# Patient Record
Sex: Male | Born: 1935 | Race: White | Hispanic: No | Smoking: Current every day smoker
Health system: Southern US, Community
[De-identification: ages and names within clinical notes are randomized; demographics above are authoritative.]

## PROBLEM LIST (undated history)

## (undated) DIAGNOSIS — J449 Chronic obstructive pulmonary disease, unspecified: Secondary | ICD-10-CM

## (undated) DIAGNOSIS — I517 Cardiomegaly: Secondary | ICD-10-CM

## (undated) DIAGNOSIS — R351 Nocturia: Secondary | ICD-10-CM

## (undated) DIAGNOSIS — Z8739 Personal history of other diseases of the musculoskeletal system and connective tissue: Secondary | ICD-10-CM

## (undated) DIAGNOSIS — I10 Essential (primary) hypertension: Secondary | ICD-10-CM

## (undated) DIAGNOSIS — E785 Hyperlipidemia, unspecified: Secondary | ICD-10-CM

## (undated) DIAGNOSIS — T7840XA Allergy, unspecified, initial encounter: Secondary | ICD-10-CM

## (undated) DIAGNOSIS — M1990 Unspecified osteoarthritis, unspecified site: Secondary | ICD-10-CM

## (undated) DIAGNOSIS — R55 Syncope and collapse: Secondary | ICD-10-CM

## (undated) DIAGNOSIS — J189 Pneumonia, unspecified organism: Secondary | ICD-10-CM

## (undated) DIAGNOSIS — I2510 Atherosclerotic heart disease of native coronary artery without angina pectoris: Secondary | ICD-10-CM

## (undated) DIAGNOSIS — Z720 Tobacco use: Secondary | ICD-10-CM

## (undated) DIAGNOSIS — Z9981 Dependence on supplemental oxygen: Secondary | ICD-10-CM

## (undated) DIAGNOSIS — I219 Acute myocardial infarction, unspecified: Secondary | ICD-10-CM

## (undated) DIAGNOSIS — Z7901 Long term (current) use of anticoagulants: Secondary | ICD-10-CM

## (undated) DIAGNOSIS — I509 Heart failure, unspecified: Secondary | ICD-10-CM

## (undated) DIAGNOSIS — M549 Dorsalgia, unspecified: Secondary | ICD-10-CM

## (undated) DIAGNOSIS — G8929 Other chronic pain: Secondary | ICD-10-CM

## (undated) DIAGNOSIS — I4891 Unspecified atrial fibrillation: Secondary | ICD-10-CM

## (undated) DIAGNOSIS — I513 Intracardiac thrombosis, not elsewhere classified: Secondary | ICD-10-CM

## (undated) HISTORY — DX: Allergy, unspecified, initial encounter: T78.40XA

## (undated) HISTORY — DX: Cardiomegaly: I51.7

## (undated) HISTORY — DX: Heart failure, unspecified: I50.9

## (undated) HISTORY — DX: Hyperlipidemia, unspecified: E78.5

## (undated) HISTORY — DX: Long term (current) use of anticoagulants: Z79.01

## (undated) HISTORY — DX: Essential (primary) hypertension: I10

## (undated) HISTORY — DX: Chronic obstructive pulmonary disease, unspecified: J44.9

## (undated) HISTORY — DX: Nocturia: R35.1

## (undated) HISTORY — DX: Tobacco use: Z72.0

## (undated) HISTORY — PX: CHOLECYSTECTOMY: SHX55

## (undated) HISTORY — PX: HERNIA REPAIR: SHX51

## (undated) HISTORY — DX: Atherosclerotic heart disease of native coronary artery without angina pectoris: I25.10

## (undated) HISTORY — DX: Syncope and collapse: R55

---

## 1979-11-19 DIAGNOSIS — I219 Acute myocardial infarction, unspecified: Secondary | ICD-10-CM

## 1979-11-19 HISTORY — PX: CARDIAC CATHETERIZATION: SHX172

## 1979-11-19 HISTORY — DX: Acute myocardial infarction, unspecified: I21.9

## 2005-05-08 ENCOUNTER — Encounter: Admission: RE | Admit: 2005-05-08 | Discharge: 2005-05-08 | Payer: Self-pay | Admitting: Cardiology

## 2008-03-09 ENCOUNTER — Emergency Department (HOSPITAL_COMMUNITY): Admission: EM | Admit: 2008-03-09 | Discharge: 2008-03-09 | Payer: Self-pay | Admitting: Emergency Medicine

## 2008-05-17 ENCOUNTER — Emergency Department (HOSPITAL_COMMUNITY): Admission: EM | Admit: 2008-05-17 | Discharge: 2008-05-17 | Payer: Self-pay | Admitting: Family Medicine

## 2010-07-02 ENCOUNTER — Ambulatory Visit: Payer: Self-pay | Admitting: Cardiology

## 2010-07-11 ENCOUNTER — Encounter
Admission: RE | Admit: 2010-07-11 | Discharge: 2010-07-11 | Payer: Self-pay | Source: Home / Self Care | Admitting: Internal Medicine

## 2010-07-27 ENCOUNTER — Ambulatory Visit: Payer: Self-pay | Admitting: Cardiology

## 2010-08-28 ENCOUNTER — Ambulatory Visit: Payer: Self-pay | Admitting: Cardiology

## 2010-09-26 ENCOUNTER — Ambulatory Visit: Payer: Self-pay | Admitting: Cardiology

## 2010-10-23 ENCOUNTER — Ambulatory Visit: Payer: Self-pay | Admitting: Cardiovascular Disease

## 2010-11-27 ENCOUNTER — Ambulatory Visit: Payer: Self-pay | Admitting: Cardiology

## 2010-12-26 ENCOUNTER — Other Ambulatory Visit (INDEPENDENT_AMBULATORY_CARE_PROVIDER_SITE_OTHER): Payer: Medicare Other

## 2010-12-26 DIAGNOSIS — I2589 Other forms of chronic ischemic heart disease: Secondary | ICD-10-CM

## 2010-12-26 DIAGNOSIS — Z7901 Long term (current) use of anticoagulants: Secondary | ICD-10-CM

## 2010-12-27 ENCOUNTER — Other Ambulatory Visit: Payer: Self-pay

## 2011-01-09 ENCOUNTER — Other Ambulatory Visit (INDEPENDENT_AMBULATORY_CARE_PROVIDER_SITE_OTHER): Payer: Medicare Other

## 2011-01-09 DIAGNOSIS — Z7901 Long term (current) use of anticoagulants: Secondary | ICD-10-CM

## 2011-01-09 DIAGNOSIS — I2510 Atherosclerotic heart disease of native coronary artery without angina pectoris: Secondary | ICD-10-CM

## 2011-01-23 ENCOUNTER — Other Ambulatory Visit (INDEPENDENT_AMBULATORY_CARE_PROVIDER_SITE_OTHER): Payer: Medicare Other

## 2011-01-23 DIAGNOSIS — R079 Chest pain, unspecified: Secondary | ICD-10-CM

## 2011-01-23 DIAGNOSIS — R0602 Shortness of breath: Secondary | ICD-10-CM

## 2011-01-23 DIAGNOSIS — I252 Old myocardial infarction: Secondary | ICD-10-CM

## 2011-01-23 DIAGNOSIS — Z7901 Long term (current) use of anticoagulants: Secondary | ICD-10-CM

## 2011-02-06 ENCOUNTER — Other Ambulatory Visit: Payer: Self-pay | Admitting: *Deleted

## 2011-02-06 ENCOUNTER — Telehealth: Payer: Self-pay | Admitting: *Deleted

## 2011-02-06 DIAGNOSIS — J449 Chronic obstructive pulmonary disease, unspecified: Secondary | ICD-10-CM

## 2011-02-06 MED ORDER — THEOPHYLLINE 300 MG PO TB12: 300.0000 mg | ORAL_TABLET | Freq: Every day | ORAL | Status: DC

## 2011-02-06 NOTE — Telephone Encounter (Signed)
Message copied by Regis Bill on Wed Feb 06, 2011  5:01 PM ------      Message from: Neil Crouch      Created: Wed Feb 06, 2011  4:39 PM      Regarding: FYI      Contact: [phone]       DR, Dionne Ano IS SENDING HIM TO [facility] FOR BREATHING TEST [facility]. JUST WANTED TO LET DR. BRACKBILL KNOW. "ALSO A NURSE WITH [employer] [facility] TOLD HIM HE MAY WANT TO HAVE TEST WHERE THEY PUT GEL OVER THE HEART AND RUN THING OVER TO SEE". JUST PASSING IT ALONG.

## 2011-02-06 NOTE — Telephone Encounter (Signed)
Refilled med per fax 

## 2011-02-07 ENCOUNTER — Ambulatory Visit (HOSPITAL_COMMUNITY)
Admission: RE | Admit: 2011-02-07 | Discharge: 2011-02-07 | Disposition: A | Discharge status: Home / Self Care | Payer: Medicare Other | Source: Ambulatory Visit | Attending: Internal Medicine | Admitting: Internal Medicine

## 2011-02-07 DIAGNOSIS — R0602 Shortness of breath: Secondary | ICD-10-CM | POA: Insufficient documentation

## 2011-02-26 ENCOUNTER — Other Ambulatory Visit: Payer: Self-pay | Admitting: *Deleted

## 2011-02-26 ENCOUNTER — Ambulatory Visit: Payer: Medicare Other | Admitting: Cardiology

## 2011-02-26 DIAGNOSIS — Z79899 Other long term (current) drug therapy: Secondary | ICD-10-CM

## 2011-02-26 DIAGNOSIS — E7800 Pure hypercholesterolemia, unspecified: Secondary | ICD-10-CM

## 2011-02-27 ENCOUNTER — Ambulatory Visit (INDEPENDENT_AMBULATORY_CARE_PROVIDER_SITE_OTHER): Payer: Medicare Other | Admitting: Cardiology

## 2011-02-27 ENCOUNTER — Encounter: Payer: Self-pay | Admitting: *Deleted

## 2011-02-27 ENCOUNTER — Encounter: Payer: Self-pay | Admitting: Cardiology

## 2011-02-27 ENCOUNTER — Other Ambulatory Visit (INDEPENDENT_AMBULATORY_CARE_PROVIDER_SITE_OTHER): Payer: Medicare Other | Admitting: *Deleted

## 2011-02-27 ENCOUNTER — Ambulatory Visit (INDEPENDENT_AMBULATORY_CARE_PROVIDER_SITE_OTHER): Payer: Medicare Other | Admitting: *Deleted

## 2011-02-27 DIAGNOSIS — F17200 Nicotine dependence, unspecified, uncomplicated: Secondary | ICD-10-CM

## 2011-02-27 DIAGNOSIS — I252 Old myocardial infarction: Secondary | ICD-10-CM | POA: Insufficient documentation

## 2011-02-27 DIAGNOSIS — R9389 Abnormal findings on diagnostic imaging of other specified body structures: Secondary | ICD-10-CM

## 2011-02-27 DIAGNOSIS — R079 Chest pain, unspecified: Secondary | ICD-10-CM | POA: Insufficient documentation

## 2011-02-27 DIAGNOSIS — J449 Chronic obstructive pulmonary disease, unspecified: Secondary | ICD-10-CM

## 2011-02-27 DIAGNOSIS — E7800 Pure hypercholesterolemia, unspecified: Secondary | ICD-10-CM

## 2011-02-27 DIAGNOSIS — Z79899 Other long term (current) drug therapy: Secondary | ICD-10-CM

## 2011-02-27 DIAGNOSIS — I4891 Unspecified atrial fibrillation: Secondary | ICD-10-CM

## 2011-02-27 DIAGNOSIS — Z720 Tobacco use: Secondary | ICD-10-CM | POA: Insufficient documentation

## 2011-02-27 DIAGNOSIS — M1990 Unspecified osteoarthritis, unspecified site: Secondary | ICD-10-CM | POA: Insufficient documentation

## 2011-02-27 DIAGNOSIS — Z7901 Long term (current) use of anticoagulants: Secondary | ICD-10-CM

## 2011-02-27 LAB — BASIC METABOLIC PANEL
BUN: 14 mg/dL (ref 6–23)
Chloride: 101 mEq/L (ref 96–112)
Glucose, Bld: 100 mg/dL — ABNORMAL HIGH (ref 70–99)
Potassium: 4 mEq/L (ref 3.5–5.1)
Sodium: 140 mEq/L (ref 135–145)

## 2011-02-27 LAB — CBC WITH DIFFERENTIAL/PLATELET
Basophils Absolute: 0.1 10*3/uL (ref 0.0–0.1)
Eosinophils Absolute: 0.3 10*3/uL (ref 0.0–0.7)
Eosinophils Relative: 3.9 % (ref 0.0–5.0)
Lymphs Abs: 1.3 10*3/uL (ref 0.7–4.0)
Monocytes Absolute: 0.8 10*3/uL (ref 0.1–1.0)
Monocytes Relative: 9.8 % (ref 3.0–12.0)
Neutro Abs: 5.5 10*3/uL (ref 1.4–7.7)
Platelets: 260 10*3/uL (ref 150.0–400.0)
RDW: 15.7 % — ABNORMAL HIGH (ref 11.5–14.6)

## 2011-02-27 LAB — HEPATIC FUNCTION PANEL
AST: 17 U/L (ref 0–37)
Albumin: 2.9 g/dL — ABNORMAL LOW (ref 3.5–5.2)
Alkaline Phosphatase: 70 U/L (ref 39–117)
Bilirubin, Direct: 0.2 mg/dL (ref 0.0–0.3)
Total Protein: 5.9 g/dL — ABNORMAL LOW (ref 6.0–8.3)

## 2011-02-27 LAB — LIPID PANEL: VLDL: 21.4 mg/dL (ref 0.0–40.0)

## 2011-02-27 NOTE — Assessment & Plan Note (Signed)
The patient's cough has improved significantly.  He is not bringing up any purulent sputum.  He denies any chills or fever or night sweats.

## 2011-02-27 NOTE — Progress Notes (Signed)
History of Present Illness: This pleasant 77 year old gentleman is seen for a scheduled followup office visit he has a history of known ischemic heart disease.  He gives a history of a remote myocardial infarction in 1981.  He had cardiac catheterization at that time.  He did not require coronary bypass surgery.  He has been on long-term Coumadin.  He has a left ventricular apical aneurysm.  His last echocardiogram was on 09/08/06 and his last nuclear stress test was 07/1707 and showed severe left ventricle systolic dysfunction with an ejection fraction of 39% and with an old apical myocardial infarction with apical aneurysm.  Current Outpatient Prescriptions  Medication Sig Dispense Refill  . ALPRAZolam (XANAX) 0.25 MG tablet Take 0.25 mg by mouth at bedtime as needed.        Marland Kitchen aspirin 81 MG tablet Take 81 mg by mouth daily.        . calcium carbonate 200 MG capsule Take 500 mg by mouth daily.        . fish oil-omega-3 fatty acids 1000 MG capsule Take 2 g by mouth daily.        . furosemide (LASIX) 40 MG tablet Take 40 mg by mouth daily. Take 1 and 1/2 pill daily (60mg )       . guaiFENesin (MUCINEX) 600 MG 12 hr tablet Take 1,200 mg by mouth 2 (two) times daily as needed.        Marland Kitchen HYDROcodone-acetaminophen (VICODIN) 5-500 MG per tablet Take 1 tablet by mouth every 6 (six) hours as needed.        . isosorbide mononitrate (IMDUR) 60 MG 24 hr tablet Take 60 mg by mouth daily.        Marland Kitchen levalbuterol (XOPENEX HFA) 45 MCG/ACT inhaler Inhale 1-2 puffs into the lungs every 4 (four) hours as needed.        . meclizine (ANTIVERT) 32 MG tablet Take 32 mg by mouth 3 (three) times daily as needed.        . metoprolol (TOPROL-XL) 50 MG 24 hr tablet Take 50 mg by mouth 2 (two) times daily.        . nitroGLYCERIN (NITROSTAT) 0.4 MG SL tablet Place 0.4 mg under the tongue every 5 (five) minutes as needed.        . potassium chloride SA (K-DUR,KLOR-CON) 20 MEQ tablet Take 20 mEq by mouth daily.        . rosuvastatin  (CRESTOR) 10 MG tablet Take 10 mg by mouth daily. Taking one half daily      . sodium bicarbonate 650 MG tablet Take 650 mg by mouth daily.        . temazepam (RESTORIL) 30 MG capsule Take 30 mg by mouth at bedtime as needed.        . theophylline (THEODUR) 300 MG 12 hr tablet Take 1 tablet (300 mg total) by mouth daily.  30 tablet  11  . valsartan-hydrochlorothiazide (DIOVAN-HCT) 160-25 MG per tablet Take 1 tablet by mouth daily.        Marland Kitchen warfarin (COUMADIN) 5 MG tablet Take 5 mg by mouth daily. Take as directed          Allergies  Allergen Reactions  . Indocin   . Lipitor (Atorvastatin Calcium)   . Pravachol   . Procardia (Nifedipine)   . Zaroxolyn (Metolazone)     Patient Active Problem List  Diagnoses  . Atrial fibrillation  . Apical myocardial infarction greater than eight weeks ago  . COPD (chronic  obstructive pulmonary disease)  . Tobacco abuse  . Hypercholesterolemia  . Osteoarthritis  . Chest pain    History  Smoking status  . Current Some Day Smoker -- 0.5 packs/day  . Types: Cigarettes  Smokeless tobacco  . Not on file    History  Alcohol Use No    No family history on file.  Review of Systems: Constitutional: no fever chills diaphoresis or fatigue or change in weight.  Head and neck: no hearing loss, no epistaxis, no photophobia or visual disturbance. Respiratory: No cough, shortness of breath or wheezing. Cardiovascular: No chest pain peripheral edema, palpitations. Gastrointestinal: No abdominal distention, no abdominal pain, no change in bowel habits hematochezia or melena. Genitourinary: No dysuria, no frequency, no urgency, no nocturia. Musculoskeletal:No arthralgias, no back pain, no gait disturbance or myalgias. Neurological: No dizziness, no headaches, no numbness, no seizures, no syncope, no weakness, no tremors. Hematologic: No lymphadenopathy, no easy bruising. Psychiatric: No confusion, no hallucinations, no sleep  disturbance.    Physical Exam: Filed Vitals:   02/27/11 1058  BP: 110/60  Pulse: 64  Weight is not recorded.  The general appearance reveals an elderly gentleman in no acute distress.Pupils equal and reactive.   Extraocular Movements are full.  There is no scleral icterus.  The mouth and pharynx are normal.  The neck is supple.  The carotids reveal no bruits.  The jugular venous pressure is normal.  The thyroid is not enlarged.  There is no lymphadenopathy.  His eyes are slightly proptotic which is normal for him.  Chest reveals scattered expiratory wheezes and rhonchi but improved from previous exam.The precordium is quiet.  The first heart sound is normal.  The second heart sound is physiologically split.  There is no murmur gallop rub or click.  There is no abnormal lift or heave.The abdomen is soft and nontender. Bowel sounds are normal. The liver and spleen are not enlarged. There Are no abdominal masses. There are no bruits. Normal extremity without phlebitis And he does have 1+ peripheral edema of the ankles.Strength is normal and symmetrical in all extremities.  There is no lateralizing weakness.  There are no sensory deficits.   Assessment / Plan: Continue present medication.Recheck in one month and in 2 months for protime in 3 months for office visit protime and EKG and after that consider echocardiogram.

## 2011-02-27 NOTE — Assessment & Plan Note (Signed)
The patient is on long-term Coumadin.  He has not been having any thromboembolic symptoms.  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  He has not had any recent documented episodes of atrial fibrillation.

## 2011-02-27 NOTE — Assessment & Plan Note (Signed)
Since last visit the patient has improved clinically.  He had been taking 4-5 nitroglycerin a day.  Now he has not had any nitroglycerin for the past several weeks.  Since last visit he has been taking indoor 60 mg daily.  It seems to have helped.  Also getting over his acute bronchitis exacerbation has helped his heart.

## 2011-02-27 NOTE — Assessment & Plan Note (Signed)
The patient generally would like to quit smoking.  He has ordered a smoke lives in [place] cigarette from a company and he is expecting to have arrive in the mail soon.

## 2011-02-28 ENCOUNTER — Telehealth: Payer: Self-pay | Admitting: *Deleted

## 2011-02-28 NOTE — Telephone Encounter (Signed)
Adv. Patient of labs 

## 2011-03-27 ENCOUNTER — Ambulatory Visit (INDEPENDENT_AMBULATORY_CARE_PROVIDER_SITE_OTHER): Payer: Medicare Other | Admitting: *Deleted

## 2011-03-27 DIAGNOSIS — Z7901 Long term (current) use of anticoagulants: Secondary | ICD-10-CM

## 2011-03-27 DIAGNOSIS — R9389 Abnormal findings on diagnostic imaging of other specified body structures: Secondary | ICD-10-CM

## 2011-03-29 ENCOUNTER — Other Ambulatory Visit: Payer: Self-pay | Admitting: *Deleted

## 2011-03-29 DIAGNOSIS — J449 Chronic obstructive pulmonary disease, unspecified: Secondary | ICD-10-CM

## 2011-03-29 MED ORDER — ALBUTEROL SULFATE HFA 108 (90 BASE) MCG/ACT IN AERS: 2.0000 | INHALATION_SPRAY | Freq: Four times a day (QID) | RESPIRATORY_TRACT | Status: DC | PRN

## 2011-03-29 NOTE — Telephone Encounter (Signed)
Refilled proair inhaler, [facility]

## 2011-04-02 ENCOUNTER — Other Ambulatory Visit: Payer: Self-pay | Admitting: *Deleted

## 2011-04-02 DIAGNOSIS — G4700 Insomnia, unspecified: Secondary | ICD-10-CM

## 2011-04-02 MED ORDER — TEMAZEPAM 30 MG PO CAPS: 30.0000 mg | ORAL_CAPSULE | Freq: Every evening | ORAL | Status: DC | PRN

## 2011-04-02 NOTE — Telephone Encounter (Signed)
Faxed authorization back.

## 2011-04-10 ENCOUNTER — Ambulatory Visit (INDEPENDENT_AMBULATORY_CARE_PROVIDER_SITE_OTHER): Payer: Medicare Other | Admitting: *Deleted

## 2011-04-10 DIAGNOSIS — R9389 Abnormal findings on diagnostic imaging of other specified body structures: Secondary | ICD-10-CM

## 2011-04-10 DIAGNOSIS — Z7901 Long term (current) use of anticoagulants: Secondary | ICD-10-CM

## 2011-04-25 ENCOUNTER — Encounter (INDEPENDENT_AMBULATORY_CARE_PROVIDER_SITE_OTHER): Payer: Medicare Other | Admitting: *Deleted

## 2011-04-25 ENCOUNTER — Ambulatory Visit (INDEPENDENT_AMBULATORY_CARE_PROVIDER_SITE_OTHER): Payer: Medicare Other | Admitting: *Deleted

## 2011-04-25 DIAGNOSIS — I4891 Unspecified atrial fibrillation: Secondary | ICD-10-CM

## 2011-04-25 DIAGNOSIS — R9389 Abnormal findings on diagnostic imaging of other specified body structures: Secondary | ICD-10-CM

## 2011-04-25 DIAGNOSIS — Z7901 Long term (current) use of anticoagulants: Secondary | ICD-10-CM

## 2011-04-29 ENCOUNTER — Telehealth: Payer: Self-pay | Admitting: Cardiology

## 2011-04-30 ENCOUNTER — Encounter: Payer: Self-pay | Admitting: *Deleted

## 2011-05-01 ENCOUNTER — Ambulatory Visit (INDEPENDENT_AMBULATORY_CARE_PROVIDER_SITE_OTHER): Payer: Medicare Other | Admitting: *Deleted

## 2011-05-01 DIAGNOSIS — Z7901 Long term (current) use of anticoagulants: Secondary | ICD-10-CM

## 2011-05-01 DIAGNOSIS — R9389 Abnormal findings on diagnostic imaging of other specified body structures: Secondary | ICD-10-CM

## 2011-05-01 LAB — POCT INR: INR: 2.3

## 2011-05-16 ENCOUNTER — Encounter: Payer: Medicare Other | Admitting: *Deleted

## 2011-05-29 ENCOUNTER — Ambulatory Visit (INDEPENDENT_AMBULATORY_CARE_PROVIDER_SITE_OTHER): Payer: Medicare Other | Admitting: *Deleted

## 2011-05-29 DIAGNOSIS — Z7901 Long term (current) use of anticoagulants: Secondary | ICD-10-CM

## 2011-05-29 DIAGNOSIS — R9389 Abnormal findings on diagnostic imaging of other specified body structures: Secondary | ICD-10-CM

## 2011-06-12 ENCOUNTER — Other Ambulatory Visit: Payer: Self-pay | Admitting: *Deleted

## 2011-06-12 DIAGNOSIS — F419 Anxiety disorder, unspecified: Secondary | ICD-10-CM

## 2011-06-12 NOTE — Telephone Encounter (Signed)
Refilled meds per fax request. Faxed signed rx back

## 2011-06-16 MED ORDER — ALPRAZOLAM 0.25 MG PO TABS: 0.2500 mg | ORAL_TABLET | Freq: Four times a day (QID) | ORAL | Status: DC | PRN

## 2011-06-17 ENCOUNTER — Encounter: Payer: Medicare Other | Admitting: *Deleted

## 2011-06-26 ENCOUNTER — Ambulatory Visit (INDEPENDENT_AMBULATORY_CARE_PROVIDER_SITE_OTHER): Payer: Medicare Other | Admitting: *Deleted

## 2011-06-26 DIAGNOSIS — Z7901 Long term (current) use of anticoagulants: Secondary | ICD-10-CM

## 2011-06-26 DIAGNOSIS — R9389 Abnormal findings on diagnostic imaging of other specified body structures: Secondary | ICD-10-CM

## 2011-07-10 ENCOUNTER — Ambulatory Visit (INDEPENDENT_AMBULATORY_CARE_PROVIDER_SITE_OTHER): Payer: Medicare Other | Admitting: *Deleted

## 2011-07-10 DIAGNOSIS — R9389 Abnormal findings on diagnostic imaging of other specified body structures: Secondary | ICD-10-CM

## 2011-07-10 DIAGNOSIS — Z7901 Long term (current) use of anticoagulants: Secondary | ICD-10-CM

## 2011-07-10 LAB — POCT INR: INR: 3.5

## 2011-07-25 ENCOUNTER — Ambulatory Visit (INDEPENDENT_AMBULATORY_CARE_PROVIDER_SITE_OTHER): Payer: Medicare Other | Admitting: *Deleted

## 2011-07-25 DIAGNOSIS — Z7901 Long term (current) use of anticoagulants: Secondary | ICD-10-CM

## 2011-07-25 DIAGNOSIS — R9389 Abnormal findings on diagnostic imaging of other specified body structures: Secondary | ICD-10-CM

## 2011-07-25 LAB — POCT INR: INR: 2.3

## 2011-08-15 ENCOUNTER — Ambulatory Visit (INDEPENDENT_AMBULATORY_CARE_PROVIDER_SITE_OTHER): Payer: Medicare Other | Admitting: *Deleted

## 2011-08-15 DIAGNOSIS — R9389 Abnormal findings on diagnostic imaging of other specified body structures: Secondary | ICD-10-CM

## 2011-08-15 DIAGNOSIS — Z7901 Long term (current) use of anticoagulants: Secondary | ICD-10-CM

## 2011-08-15 LAB — POCT I-STAT, CHEM 8
BUN: 20
Calcium, Ion: 1.15
Chloride: 101
Potassium: 4.4

## 2011-08-15 LAB — PROTIME-INR: INR: 2 — ABNORMAL HIGH

## 2011-08-28 ENCOUNTER — Encounter: Payer: Self-pay | Admitting: Cardiology

## 2011-08-28 ENCOUNTER — Ambulatory Visit (INDEPENDENT_AMBULATORY_CARE_PROVIDER_SITE_OTHER): Payer: Medicare Other | Admitting: Cardiology

## 2011-08-28 VITALS — BP 105/63 | HR 57 | Ht 69.0 in | Wt 187.0 lb

## 2011-08-28 DIAGNOSIS — J449 Chronic obstructive pulmonary disease, unspecified: Secondary | ICD-10-CM

## 2011-08-28 DIAGNOSIS — R079 Chest pain, unspecified: Secondary | ICD-10-CM

## 2011-08-28 DIAGNOSIS — I119 Hypertensive heart disease without heart failure: Secondary | ICD-10-CM

## 2011-08-28 DIAGNOSIS — I259 Chronic ischemic heart disease, unspecified: Secondary | ICD-10-CM

## 2011-08-28 DIAGNOSIS — I4891 Unspecified atrial fibrillation: Secondary | ICD-10-CM

## 2011-08-28 MED ORDER — ISOSORBIDE MONONITRATE ER 60 MG PO TB24: 60.0000 mg | ORAL_TABLET | Freq: Two times a day (BID) | ORAL | Status: DC

## 2011-08-28 NOTE — Assessment & Plan Note (Signed)
The patient states that he has left-sided chest pain walking from one room to another in his house.  The pain is relieved by rest.  It does not radiate down the left arm.  He states that he goes through a bottle of 25 nitroglycerin in less than a month.  He is wondering if he could try increasing his Imdur to twice a day, and this will be reasonable in the short-term.

## 2011-08-28 NOTE — Assessment & Plan Note (Signed)
The patient has a history of COPD.  He continues to smoke against advice, although he is trying to cut down and we counseled him on the importance of smoking cessation today.  He has not had any fever or productive cough at this time.

## 2011-08-28 NOTE — Patient Instructions (Signed)
Your physician has requested that you have a lexiscan myoview. For further information please visit [email or web address] Please follow instruction sheet, as given.  continue same dose of medications

## 2011-08-28 NOTE — Progress Notes (Addendum)
Sean Figueroa Date of Birth:  1933-03-21 [facility] [address]   [address] [place], [place]  [address] [phone]           Fax   [phone]  History of Present Illness: This pleasant 77 year old gentleman is seen for a scheduled followup office visit.  As a history of known ischemic heart disease.  He had a remote myocardial infarction in 1981.  He had cardiac catheterization at that time, but not since.  He did not require coronary artery bypass graft surgery.  He does have a known left ventricular apical aneurysm and has been on long-term Coumadin since 1981.  His last echocardiogram was in 12/2005 and his last nuclear stress test was in September 2008 and at that time.  He was found to have severe left ventricular systolic dysfunction with an ejection fraction of 39% and with an old apical myocardial infarction with apical aneurysm.  Patient states that he is now having increasing angina pectoris.  He also has exertional dyspnea.  Current Outpatient Prescriptions  Medication Sig Dispense Refill  . albuterol (PROAIR HFA) 108 (90 BASE) MCG/ACT inhaler Inhale 2 puffs into the lungs every 6 (six) hours as needed for wheezing.  1 Inhaler  6  . ALPRAZolam (XANAX) 0.25 MG tablet Take 1 tablet (0.25 mg total) by mouth every 6 (six) hours as needed.  30 tablet  5  . aspirin 81 MG tablet Take 81 mg by mouth daily.        . calcium carbonate 200 MG capsule Take 500 mg by mouth daily.        . fish oil-omega-3 fatty acids 1000 MG capsule Take 2 g by mouth daily.        . furosemide (LASIX) 40 MG tablet Take 40 mg by mouth daily. Take 1 and 1/2 pill daily (60mg )       . guaiFENesin (MUCINEX) 600 MG 12 hr tablet Take 1,200 mg by mouth 2 (two) times daily as needed.        Marland Kitchen HYDROcodone-acetaminophen (VICODIN) 5-500 MG per tablet Take 1 tablet by mouth every 6 (six) hours as needed.        . isosorbide mononitrate (IMDUR) 60 MG 24 hr tablet Take 1 tablet (60 mg total) by  mouth 2 (two) times daily.  60 tablet  11  . levalbuterol (XOPENEX HFA) 45 MCG/ACT inhaler Inhale 1-2 puffs into the lungs every 4 (four) hours as needed.        . meclizine (ANTIVERT) 32 MG tablet Take 32 mg by mouth 3 (three) times daily as needed.        . metoprolol (TOPROL-XL) 50 MG 24 hr tablet Take 50 mg by mouth 2 (two) times daily.        . nitroGLYCERIN (NITROSTAT) 0.4 MG SL tablet Place 0.4 mg under the tongue every 5 (five) minutes as needed.        . potassium chloride SA (K-DUR,KLOR-CON) 20 MEQ tablet Take 20 mEq by mouth daily.        . rosuvastatin (CRESTOR) 10 MG tablet Take 5 mg by mouth daily. Taking one half daily      . sodium bicarbonate 650 MG tablet Take 650 mg by mouth daily.        . temazepam (RESTORIL) 30 MG capsule Take 1 capsule (30 mg total) by mouth at bedtime as needed.  30 capsule  5  . theophylline (THEODUR) 300 MG 12 hr tablet Take  1 tablet (300 mg total) by mouth daily.  30 tablet  11  . valsartan-hydrochlorothiazide (DIOVAN-HCT) 160-25 MG per tablet Take 1 tablet by mouth daily.        Marland Kitchen warfarin (COUMADIN) 5 MG tablet Take 5 mg by mouth daily. Take as directed          Allergies  Allergen Reactions  . Indocin   . Lipitor (Atorvastatin Calcium)   . Pravachol   . Procardia (Nifedipine)   . Zaroxolyn (Metolazone)     Patient Active Problem List  Diagnoses  . Atrial fibrillation  . Apical myocardial infarction greater than eight weeks ago  . COPD (chronic obstructive pulmonary disease)  . Tobacco abuse  . Hypercholesterolemia  . Osteoarthritis  . Chest pain    History  Smoking status  . Current Some Day Smoker -- 0.5 packs/day  . Types: Cigarettes  Smokeless tobacco  . Not on file    History  Alcohol Use No    Family History  Problem Relation Age of Onset  . Heart disease Mother   . Cerebral palsy Daughter     Review of Systems: Constitutional: no fever chills diaphoresis or fatigue or change in weight.  Head and neck: no  hearing loss, no epistaxis, no photophobia or visual disturbance. Respiratory: No cough, shortness of breath or wheezing. Cardiovascular: No chest pain peripheral edema, palpitations. Gastrointestinal: No abdominal distention, no abdominal pain, no change in bowel habits hematochezia or melena. Genitourinary: No dysuria, no frequency, no urgency, no nocturia. Musculoskeletal:No arthralgias, no back pain, no gait disturbance or myalgias. Neurological: No dizziness, no headaches, no numbness, no seizures, no syncope, no weakness, no tremors. Hematologic: No lymphadenopathy, no easy bruising. Psychiatric: No confusion, no hallucinations, no sleep disturbance.    Physical Exam: Filed Vitals:   08/28/11 1114  BP: 105/63  Pulse: 57   general appearance reveals a elderly gentleman in no acute distress.Pupils equal and reactive.   Extraocular Movements are full.  There is no scleral icterus.  The mouth and pharynx are normal.  The neck is supple.  The carotids reveal no bruits.  The jugular venous pressure is normal.  The thyroid is not enlarged.  There is no lymphadenopathy.  Chest reveals distant breath sounds and mild expiratory wheezes.The precordium is quiet.  The first heart sound is normal.  The second heart sound is physiologically split.  There is no murmur gallop rub or click.  There is no abnormal lift or heave.  The abdomen is soft and nontender. Bowel sounds are normal. The liver and spleen are not enlarged. There Are no abdominal masses. There are no bruits.  The pedal pulses are good.  There is no phlebitis or edema.  There is no cyanosis or clubbing. Strength is normal and symmetrical in all extremities.  There is no lateralizing weakness.  There are no sensory deficits.  The EKG today shows no significant change since March of 2012.  It does show evidence of his old anterior apical myocardial infarction with lateral ST and T-wave abnormalities.  The rhythm is sinus  bradycardia.   Assessment / Plan:  increase hi Imdur to 60 mg twice a day for the time being.  We will arrange for a walking Lexiscan to see if he has any significant reversible ischemia, which would lead [place] to proceeding with cardiac catheterization.  s

## 2011-08-28 NOTE — Assessment & Plan Note (Signed)
The patient has had no episodes of atrial fibrillation since last visit.

## 2011-09-05 ENCOUNTER — Encounter: Payer: Self-pay | Admitting: *Deleted

## 2011-09-11 ENCOUNTER — Other Ambulatory Visit: Payer: Self-pay | Admitting: *Deleted

## 2011-09-11 DIAGNOSIS — J449 Chronic obstructive pulmonary disease, unspecified: Secondary | ICD-10-CM

## 2011-09-11 MED ORDER — ALBUTEROL SULFATE HFA 108 (90 BASE) MCG/ACT IN AERS: 2.0000 | INHALATION_SPRAY | Freq: Four times a day (QID) | RESPIRATORY_TRACT | Status: DC | PRN

## 2011-09-11 NOTE — Telephone Encounter (Signed)
Refilled proair  

## 2011-09-12 ENCOUNTER — Encounter: Payer: Medicare Other | Admitting: *Deleted

## 2011-09-12 ENCOUNTER — Ambulatory Visit (HOSPITAL_COMMUNITY): Payer: Medicare Other | Attending: Cardiology | Admitting: Radiology

## 2011-09-12 ENCOUNTER — Ambulatory Visit (INDEPENDENT_AMBULATORY_CARE_PROVIDER_SITE_OTHER): Payer: Medicare Other | Admitting: *Deleted

## 2011-09-12 VITALS — Ht 69.0 in | Wt 182.0 lb

## 2011-09-12 DIAGNOSIS — R9389 Abnormal findings on diagnostic imaging of other specified body structures: Secondary | ICD-10-CM

## 2011-09-12 DIAGNOSIS — I2510 Atherosclerotic heart disease of native coronary artery without angina pectoris: Secondary | ICD-10-CM

## 2011-09-12 DIAGNOSIS — R0602 Shortness of breath: Secondary | ICD-10-CM

## 2011-09-12 DIAGNOSIS — I259 Chronic ischemic heart disease, unspecified: Secondary | ICD-10-CM | POA: Insufficient documentation

## 2011-09-12 DIAGNOSIS — Z7901 Long term (current) use of anticoagulants: Secondary | ICD-10-CM

## 2011-09-12 DIAGNOSIS — I252 Old myocardial infarction: Secondary | ICD-10-CM

## 2011-09-12 DIAGNOSIS — R079 Chest pain, unspecified: Secondary | ICD-10-CM

## 2011-09-12 LAB — POCT INR: INR: 2.1

## 2011-09-12 MED ORDER — TECHNETIUM TC 99M TETROFOSMIN IV KIT
11.0000 | PACK | Freq: Once | INTRAVENOUS | Status: AC | PRN
  Administered 2011-09-12: 11 via INTRAVENOUS

## 2011-09-12 MED ORDER — REGADENOSON 0.4 MG/5ML IV SOLN
0.4000 mg | Freq: Once | INTRAVENOUS | Status: AC
  Administered 2011-09-12: 0.4 mg via INTRAVENOUS

## 2011-09-12 MED ORDER — TECHNETIUM TC 99M TETROFOSMIN IV KIT
33.0000 | PACK | Freq: Once | INTRAVENOUS | Status: AC | PRN
  Administered 2011-09-12: 33 via INTRAVENOUS

## 2011-09-12 NOTE — Progress Notes (Signed)
[facility] SITE 3 NUCLEAR MED [address] [place] [address] [phone]  Cardiology Nuclear Med Study  ELRAY DAINS is a 77 y.o. male [id number] 03-11-1933   Nuclear Med Background Indication for Stress Test:  Evaluation for Ischemia History: 2007 Echo-EF 50-55%,1981 Heart Catheterization-N/o CAD, 1981 Myocardial Infarction and 2008 Myocardial Perfusion Study EF 39% Cardiac Risk Factors: Hypertension, Lipids and Smoker  Symptoms:  Chest Pain, Chest Pain (last date of chest discomfort this AM), Dizziness, DOE, Fatigue with Exertion, Light-Headedness and SOB   Nuclear Pre-Procedure Caffeine/Decaff Intake:  None NPO After: 7:00pm   Lungs:  Mild expiratory wheezing IV 0.9% NS with Angio Cath:  22g  IV Site: R Wrist  IV Started by:  Stanton Kidney, EMT-P  Chest Size (in):  44 Cup Size: n/a  Height: 5\' 9"  (1.753 m)  Weight:  182 lb (82.555 kg)  BMI:  Body mass index is 26.88 kg/(m^2). Tech Comments:  Theodur last taken > 48 hours  Per patient.    Nuclear Med Study 1 or 2 day study: 1 day  Stress Test Type:  Eugenie Birks  Reading MD: Charlton Haws, MD  Order Authorizing Provider:  Dr. Cassell Clement  Resting Radionuclide: Technetium 19m Tetrofosmin  Resting Radionuclide Dose: 11.0 mCi   Stress Radionuclide:  Technetium 30m Tetrofosmin  Stress Radionuclide Dose: 33.0 mCi           Stress Protocol Rest HR: 55 Stress HR: 70  Rest BP: 131/76 Stress BP: 131/67  Exercise Time (min): n/a METS: n/a   Predicted Max HR: 144 bpm % Max HR: 48.61 bpm Rate Pressure Product: 9170   Dose of Adenosine (mg):  n/a Dose of Lexiscan: 0.4 mg  Dose of Atropine (mg): n/a Dose of Dobutamine: n/a mcg/kg/min (at max HR)  Stress Test Technologist: Bonnita Levan, RN  Nuclear Technologist:  Domenic Polite, CNMT     Rest Procedure:  Myocardial perfusion imaging was performed at rest 45 minutes following the intravenous administration of Technetium 63m Tetrofosmin. Rest ECG: Sinus  Bradycardia with T wave abnormalities, No Acute Changes  Stress Procedure:  The patient received IV Lexiscan 0.4 mg over 15-seconds.  Technetium 85m Tetrofosmin injected at 30-seconds.  There were no significant changes with Lexiscan.  The patient experienced chest tightness with increased wheezing with Lexiscan injection. The chest tightness and wheezing improved with use of inhaler. Quantitative spect images were obtained after a 45 minute delay.  Stress ECG: No significant change from baseline ECG  QPS Raw Data Images:  Patient motion noted. Stress Images:  There is decreased uptake in the anterior wall. Rest Images:  There is decreased uptake in the anterior wall. Subtraction (SDS):  There is a fixed defect that is most consistent with a previous infarction. Transient Ischemic Dilatation (Normal <1.22):  1.09 Lung/Heart Ratio (Normal <0.45):  0.38  Quantitative Gated Spect Images QGS EDV:  196 ml QGS ESV:  130 ml QGS cine images:  Anteroapical hypokinesis  EF 30% QGS EF: 30%  Impression Exercise Capacity:  Lexiscan with no exercise. BP Response:  Normal blood pressure response. Clinical Symptoms:  There is dyspnea. ECG Impression:  No significant ST segment change suggestive of ischemia. Comparison with Prior Nuclear Study: No images to compare  Overall Impression:  Moderate sized anteroapical wall infarct.  Smaller inferolateral wall infarct at the apex with mild periinfarct ischemia of the inferolateral wall at mid and basal level.  EF 30%     Charlton Haws

## 2011-09-13 ENCOUNTER — Encounter: Payer: Medicare Other | Admitting: *Deleted

## 2011-09-19 ENCOUNTER — Telehealth: Payer: Self-pay | Admitting: *Deleted

## 2011-09-19 NOTE — Telephone Encounter (Signed)
Notified of stress test results. States he is still having some chest discomfort and tingling in (L) arm. Had to take NTG this AM and pain was relieved. States he has no energy. States he is afraid to have cath because they "lost him" last time. Also states legs still swelling. Is wearing support hose. Advised would talk w/Dr. Patty Sermons tomorrow and call him with recommendations.

## 2011-09-19 NOTE — Telephone Encounter (Signed)
Message copied by Lorayne Bender on Thu Sep 19, 2011  5:54 PM ------      Message from: Cassell Clement      Created: Sun Sep 15, 2011  4:06 PM       Please report.  The stress test shows the old  Apical heart attack and also an old inferolateral scar with minimal peri-infarct ischemia.  He should continue his same cardiac medication.  It would really help his cardiac situation  If he would stop smoking altogether.  It would also help his lungs.  If he continues to have a lot of chest discomfort, he should let us know and we may need to consider cardiac catheterization.

## 2011-09-20 ENCOUNTER — Telehealth: Payer: Self-pay | Admitting: *Deleted

## 2011-09-20 MED ORDER — RANOLAZINE ER 500 MG PO TB12: 500.0000 mg | ORAL_TABLET | Freq: Two times a day (BID) | ORAL | Status: DC

## 2011-09-20 NOTE — Telephone Encounter (Signed)
Sean Figueroa spoke with patient yesterday and he continues to have chest pain. He stated he did not want to have another cath, very scared of doing so. Discussed with Dr Patty Sermons and will try Ranexa 500 mg twice daily.  Advised patient and asked that he call back and let us know how he was doing.

## 2011-09-25 ENCOUNTER — Other Ambulatory Visit: Payer: Self-pay | Admitting: *Deleted

## 2011-09-25 DIAGNOSIS — F419 Anxiety disorder, unspecified: Secondary | ICD-10-CM

## 2011-09-25 MED ORDER — AZITHROMYCIN 250 MG PO TABS: ORAL_TABLET | ORAL | Status: AC

## 2011-09-25 MED ORDER — ALPRAZOLAM 0.25 MG PO TABS: 0.2500 mg | ORAL_TABLET | Freq: Four times a day (QID) | ORAL | Status: DC | PRN

## 2011-09-25 NOTE — Telephone Encounter (Signed)
Refilled meds per fax request.  

## 2011-09-25 NOTE — Telephone Encounter (Signed)
Called refill for azithromycin per Dr. Patty Sermons

## 2011-10-01 ENCOUNTER — Other Ambulatory Visit: Payer: Self-pay | Admitting: *Deleted

## 2011-10-01 DIAGNOSIS — G4700 Insomnia, unspecified: Secondary | ICD-10-CM

## 2011-10-01 MED ORDER — TEMAZEPAM 30 MG PO CAPS: 30.0000 mg | ORAL_CAPSULE | Freq: Every evening | ORAL | Status: DC | PRN

## 2011-10-01 NOTE — Telephone Encounter (Signed)
Refilled meds per fax request.  

## 2011-10-11 ENCOUNTER — Ambulatory Visit (INDEPENDENT_AMBULATORY_CARE_PROVIDER_SITE_OTHER): Payer: Medicare Other | Admitting: *Deleted

## 2011-10-11 DIAGNOSIS — R9389 Abnormal findings on diagnostic imaging of other specified body structures: Secondary | ICD-10-CM

## 2011-10-11 DIAGNOSIS — I252 Old myocardial infarction: Secondary | ICD-10-CM

## 2011-10-11 DIAGNOSIS — Z7901 Long term (current) use of anticoagulants: Secondary | ICD-10-CM

## 2011-10-11 LAB — POCT INR: INR: 2.5

## 2011-10-28 ENCOUNTER — Encounter: Payer: Self-pay | Admitting: Cardiology

## 2011-11-07 ENCOUNTER — Telehealth: Payer: Self-pay | Admitting: Cardiology

## 2011-11-07 NOTE — Telephone Encounter (Signed)
New msg Pt takes coumadin and he wants to know if he can take aspirin also please call

## 2011-11-07 NOTE — Telephone Encounter (Signed)
Pt currently on asa 81mg  daily, wanted to know if he could take tylenol and informed him he could. Pt to call back with further questions or concerns.

## 2011-11-22 ENCOUNTER — Ambulatory Visit (INDEPENDENT_AMBULATORY_CARE_PROVIDER_SITE_OTHER): Payer: Medicare Other | Admitting: *Deleted

## 2011-11-22 ENCOUNTER — Other Ambulatory Visit: Payer: Self-pay | Admitting: *Deleted

## 2011-11-22 ENCOUNTER — Encounter: Payer: Self-pay | Admitting: Cardiology

## 2011-11-22 ENCOUNTER — Ambulatory Visit (INDEPENDENT_AMBULATORY_CARE_PROVIDER_SITE_OTHER): Payer: Medicare Other | Admitting: Cardiology

## 2011-11-22 VITALS — BP 128/80 | HR 60 | Ht 69.0 in | Wt 182.0 lb

## 2011-11-22 DIAGNOSIS — I4891 Unspecified atrial fibrillation: Secondary | ICD-10-CM

## 2011-11-22 DIAGNOSIS — E7800 Pure hypercholesterolemia, unspecified: Secondary | ICD-10-CM

## 2011-11-22 DIAGNOSIS — J449 Chronic obstructive pulmonary disease, unspecified: Secondary | ICD-10-CM

## 2011-11-22 DIAGNOSIS — R079 Chest pain, unspecified: Secondary | ICD-10-CM

## 2011-11-22 DIAGNOSIS — I252 Old myocardial infarction: Secondary | ICD-10-CM

## 2011-11-22 DIAGNOSIS — I2510 Atherosclerotic heart disease of native coronary artery without angina pectoris: Secondary | ICD-10-CM

## 2011-11-22 DIAGNOSIS — I119 Hypertensive heart disease without heart failure: Secondary | ICD-10-CM

## 2011-11-22 DIAGNOSIS — Z7901 Long term (current) use of anticoagulants: Secondary | ICD-10-CM

## 2011-11-22 DIAGNOSIS — R05 Cough: Secondary | ICD-10-CM

## 2011-11-22 DIAGNOSIS — R9389 Abnormal findings on diagnostic imaging of other specified body structures: Secondary | ICD-10-CM

## 2011-11-22 LAB — POCT INR: INR: 2.4

## 2011-11-22 MED ORDER — ALBUTEROL SULFATE HFA 108 (90 BASE) MCG/ACT IN AERS: 2.0000 | INHALATION_SPRAY | Freq: Four times a day (QID) | RESPIRATORY_TRACT | Status: DC | PRN

## 2011-11-22 NOTE — Assessment & Plan Note (Signed)
His chest pain is related to exertion and is relieved by rest or nitroglycerin.  He is not having any crescendo pattern to his angina.

## 2011-11-22 NOTE — Assessment & Plan Note (Addendum)
The patient is still smoking a half a pack of cigarettes a day.  He states that he would like to quit.  We talked about the importance of quitting smoking.  He has a friend who has used a hypnotist .  The patient is going to evaluate that further for himself.

## 2011-11-22 NOTE — Assessment & Plan Note (Signed)
The patient has had no recurrence of atrial fibrillation. 

## 2011-11-22 NOTE — Progress Notes (Signed)
Sean Figueroa Date of Birth:  Feb 19, 1933 [facility] / [facility] [address]   [address] [place], [place]  [address] [phone]           Fax   [phone]  History of Present Illness: This pleasant elderly gentleman is seen for a four-month followup office visit.  He has a history of known ischemic heart disease.  He had a remote myocardial infarction in 1981 his last cardiac catheterization was at that time.  Did not require coronary artery bypass graft surgery.  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  He had a recent nuclear stress test which showed an extensive old anterior and apical scar with some peri-infarct ischemia and an ejection fraction of 30%.  The patient is not interested in followup cardiac catheterization or anything that would require hospitalization.  He has not been having any tachycardia dizzy spells or syncope.  He does have exertional angina and uses about 25 nitroglycerin a month.  Current Outpatient Prescriptions  Medication Sig Dispense Refill  . albuterol (PROAIR HFA) 108 (90 BASE) MCG/ACT inhaler Inhale 2 puffs into the lungs every 6 (six) hours as needed for wheezing.  1 Inhaler  5  . ALPRAZolam (XANAX) 0.25 MG tablet Take 1 tablet (0.25 mg total) by mouth every 6 (six) hours as needed for anxiety.  30 tablet  5  . aspirin 81 MG tablet Take 81 mg by mouth daily.        . calcium carbonate 200 MG capsule Take 500 mg by mouth daily.        . fish oil-omega-3 fatty acids 1000 MG capsule Take 2 g by mouth daily.        . furosemide (LASIX) 40 MG tablet Take 40 mg by mouth daily. Take 1 and 1/2 pill daily (60mg )       . guaiFENesin (MUCINEX) 600 MG 12 hr tablet Take 1,200 mg by mouth 2 (two) times daily as needed.        Marland Kitchen HYDROcodone-acetaminophen (VICODIN) 5-500 MG per tablet Take 1 tablet by mouth every 6 (six) hours as needed.        . isosorbide  mononitrate (IMDUR) 60 MG 24 hr tablet Take 1 tablet (60 mg total) by mouth 2 (two) times daily.  60 tablet  11  . levalbuterol (XOPENEX HFA) 45 MCG/ACT inhaler Inhale 1-2 puffs into the lungs every 4 (four) hours as needed.        . meclizine (ANTIVERT) 32 MG tablet Take 32 mg by mouth 3 (three) times daily as needed.        . metoprolol (TOPROL-XL) 50 MG 24 hr tablet Take 50 mg by mouth 2 (two) times daily.        . nitroGLYCERIN (NITROSTAT) 0.4 MG SL tablet Place 0.4 mg under the tongue every 5 (five) minutes as needed.        . potassium chloride SA (K-DUR,KLOR-CON) 20 MEQ tablet Take 20 mEq by mouth daily.        . ranolazine (RANEXA) 500 MG 12 hr tablet Take 1 tablet (500 mg total) by mouth 2 (two) times daily.  60 tablet  3  . rosuvastatin (CRESTOR) 10 MG tablet Take 5 mg by mouth daily. Taking one half daily      . sodium bicarbonate 650 MG tablet Take 650 mg by mouth daily.        Marland Kitchen  temazepam (RESTORIL) 30 MG capsule Take 1 capsule (30 mg total) by mouth at bedtime as needed.  30 capsule  5  . theophylline (THEODUR) 300 MG 12 hr tablet Take 1 tablet (300 mg total) by mouth daily.  30 tablet  11  . valsartan-hydrochlorothiazide (DIOVAN-HCT) 160-25 MG per tablet Take 1 tablet by mouth daily.        Marland Kitchen warfarin (COUMADIN) 5 MG tablet Take 5 mg by mouth daily. Take as directed        . DISCONTD: albuterol (PROAIR HFA) 108 (90 BASE) MCG/ACT inhaler Inhale 2 puffs into the lungs every 6 (six) hours as needed for wheezing.  1 Inhaler  6    Allergies  Allergen Reactions  . Indocin   . Lipitor (Atorvastatin Calcium)   . Pravachol   . Procardia (Nifedipine)   . Zaroxolyn (Metolazone)     Patient Active Problem List  Diagnoses  . Atrial fibrillation  . Apical myocardial infarction greater than eight weeks ago  . COPD (chronic obstructive pulmonary disease)  . Tobacco abuse  . Hypercholesterolemia  . Osteoarthritis  . Chest pain    History  Smoking status  . Current Some Day Smoker  -- 0.5 packs/day  . Types: Cigarettes  Smokeless tobacco  . Not on file    History  Alcohol Use No    Family History  Problem Relation Age of Onset  . Heart disease Mother   . Cerebral palsy Daughter     Review of Systems: Constitutional: no fever chills diaphoresis or fatigue or change in weight.  Head and neck: no hearing loss, no epistaxis, no photophobia or visual disturbance. Respiratory: No cough, shortness of breath or wheezing. Cardiovascular: No chest pain peripheral edema, palpitations. Gastrointestinal: No abdominal distention, no abdominal pain, no change in bowel habits hematochezia or melena. Genitourinary: No dysuria, no frequency, no urgency, no nocturia. Musculoskeletal:No arthralgias, no back pain, no gait disturbance or myalgias. Neurological: No dizziness, no headaches, no numbness, no seizures, no syncope, no weakness, no tremors. Hematologic: No lymphadenopathy, no easy bruising. Psychiatric: No confusion, no hallucinations, no sleep disturbance.    Physical Exam: Filed Vitals:   11/22/11 1128  BP: 128/80  Pulse: 60   the general appearance reveals a well-developed elderly gentleman in no distress.Pupils equal and reactive.   Extraocular Movements are full.  There is no scleral icterus.  The mouth and pharynx are normal.  The neck is supple.  The carotids reveal no bruits.  The jugular venous pressure is normal.  The thyroid is not enlarged.  There is no lymphadenopathy.  The chest reveals scattered rhonchiThe precordium is quiet.  The first heart sound is normal.  The second heart sound is physiologically split.  There is no murmur gallop rub or click.  There is no abnormal lift or heave.  The abdomen is soft and nontender. Bowel sounds are normal. The liver and spleen are not enlarged. There Are no abdominal masses. There are no bruits.  The pedal pulses are good.  There is no phlebitis or edema.  There is no cyanosis or clubbing. Neurologic is  unremarkable.The skin is warm and dry.  There is no rash.    Assessment / Plan: Continue same medication.  Return in 4 months for followup office visit and EKG.  His primary care physician and has been checking his lipids.

## 2011-11-22 NOTE — Patient Instructions (Signed)
Your physician recommends that you continue on your current medications as directed. Please refer to the Current Medication list given to you today. Your physician wants you to follow-up in: 4 months You will receive a reminder letter in the mail two months in advance. If you don't receive a letter, please call our office to schedule the follow-up appointment.  

## 2011-11-26 ENCOUNTER — Telehealth: Payer: Self-pay | Admitting: Cardiology

## 2011-11-26 NOTE — Telephone Encounter (Signed)
Pt advised Dr Patty Sermons and Nurse Juliette Alcide out of the office today. Pt reports he has had nasal congestion and productive cough with yellow sputum for several days. Denies fever. He attempted to reach his primary doctor but he is out of the office ill.  Pt is taking Mucinex BID as noted on his med list. Pt states he may wait til tomorrow to try to reach Dr Patty Sermons or Juliette Alcide. Advised pt to go to Urgent Care for treatment today.

## 2011-11-26 NOTE — Telephone Encounter (Signed)
New Msg: pt calling stating that he has a head cold and wanted to know if Dr. Patty Sermons would call pt in a z-pak. Please return pt call to discuss further if necessary or call z-pak into [employer].

## 2011-12-02 ENCOUNTER — Other Ambulatory Visit: Payer: Self-pay | Admitting: *Deleted

## 2011-12-02 MED ORDER — WARFARIN SODIUM 5 MG PO TABS: 5.0000 mg | ORAL_TABLET | ORAL | Status: DC

## 2011-12-16 ENCOUNTER — Other Ambulatory Visit: Payer: Self-pay | Admitting: *Deleted

## 2011-12-16 MED ORDER — ROSUVASTATIN CALCIUM 10 MG PO TABS: ORAL_TABLET | ORAL | Status: DC

## 2011-12-16 NOTE — Telephone Encounter (Signed)
Refill crestor

## 2011-12-20 HISTORY — PX: CORONARY ANGIOPLASTY WITH STENT PLACEMENT: SHX49

## 2011-12-23 ENCOUNTER — Other Ambulatory Visit: Payer: Self-pay | Admitting: Cardiology

## 2011-12-23 ENCOUNTER — Other Ambulatory Visit: Payer: Self-pay | Admitting: *Deleted

## 2011-12-23 DIAGNOSIS — I259 Chronic ischemic heart disease, unspecified: Secondary | ICD-10-CM

## 2011-12-23 MED ORDER — ISOSORBIDE MONONITRATE ER 60 MG PO TB24: 60.0000 mg | ORAL_TABLET | Freq: Two times a day (BID) | ORAL | Status: DC

## 2011-12-23 MED ORDER — METOPROLOL SUCCINATE ER 50 MG PO TB24: 50.0000 mg | ORAL_TABLET | Freq: Two times a day (BID) | ORAL | Status: DC

## 2011-12-23 NOTE — Telephone Encounter (Signed)
Refilled metoprolol 

## 2011-12-23 NOTE — Telephone Encounter (Signed)
refill imdur 60 mg twice a day

## 2011-12-23 NOTE — Telephone Encounter (Signed)
New Refill   Please call patient at hm# regarding prescription instructions......Marland Kitchen Label say 1 pill per day but patient says Dr. Patty Sermons instructed him to take 2 pills....Marland KitchenMarland Kitchenplease advise and refill

## 2011-12-24 ENCOUNTER — Other Ambulatory Visit: Payer: Self-pay | Admitting: *Deleted

## 2011-12-24 ENCOUNTER — Telehealth: Payer: Self-pay | Admitting: Cardiology

## 2011-12-24 MED ORDER — METOPROLOL TARTRATE 50 MG PO TABS: 50.0000 mg | ORAL_TABLET | Freq: Two times a day (BID) | ORAL | Status: DC

## 2011-12-24 NOTE — Telephone Encounter (Signed)
New Msg: Pharmacy calling wanting to speak with nurse/md to clarify pt metroprolol. Please return call to discuss further.

## 2011-12-24 NOTE — Telephone Encounter (Signed)
Pharmacy phoned and Rx for Toprol sent to pharmacy and patient has been taking Lopressor all along.  Verified in paper chart and changed in system.  Advised pharmacist

## 2012-01-03 ENCOUNTER — Ambulatory Visit (INDEPENDENT_AMBULATORY_CARE_PROVIDER_SITE_OTHER): Payer: Medicare Other | Admitting: *Deleted

## 2012-01-03 DIAGNOSIS — I252 Old myocardial infarction: Secondary | ICD-10-CM

## 2012-01-03 DIAGNOSIS — R9389 Abnormal findings on diagnostic imaging of other specified body structures: Secondary | ICD-10-CM

## 2012-01-03 DIAGNOSIS — Z7901 Long term (current) use of anticoagulants: Secondary | ICD-10-CM

## 2012-01-03 LAB — POCT INR: INR: 2.7

## 2012-01-14 ENCOUNTER — Other Ambulatory Visit: Payer: Self-pay

## 2012-01-14 ENCOUNTER — Encounter (HOSPITAL_COMMUNITY): Payer: Self-pay | Admitting: *Deleted

## 2012-01-14 ENCOUNTER — Telehealth: Payer: Self-pay | Admitting: Cardiology

## 2012-01-14 ENCOUNTER — Inpatient Hospital Stay (HOSPITAL_COMMUNITY)
Admission: EM | Admit: 2012-01-14 | Discharge: 2012-01-22 | DRG: 001 | Disposition: A | Discharge status: Home / Self Care | Payer: Medicare Other | Source: Ambulatory Visit | Attending: Internal Medicine | Admitting: Internal Medicine

## 2012-01-14 ENCOUNTER — Emergency Department (HOSPITAL_COMMUNITY): Payer: Medicare Other

## 2012-01-14 DIAGNOSIS — Y9210 Unspecified residential institution as the place of occurrence of the external cause: Secondary | ICD-10-CM | POA: Diagnosis not present

## 2012-01-14 DIAGNOSIS — Z7982 Long term (current) use of aspirin: Secondary | ICD-10-CM

## 2012-01-14 DIAGNOSIS — Z79899 Other long term (current) drug therapy: Secondary | ICD-10-CM

## 2012-01-14 DIAGNOSIS — I2510 Atherosclerotic heart disease of native coronary artery without angina pectoris: Secondary | ICD-10-CM

## 2012-01-14 DIAGNOSIS — I4891 Unspecified atrial fibrillation: Secondary | ICD-10-CM | POA: Diagnosis present

## 2012-01-14 DIAGNOSIS — I259 Chronic ischemic heart disease, unspecified: Secondary | ICD-10-CM

## 2012-01-14 DIAGNOSIS — Z7902 Long term (current) use of antithrombotics/antiplatelets: Secondary | ICD-10-CM

## 2012-01-14 DIAGNOSIS — Z720 Tobacco use: Secondary | ICD-10-CM

## 2012-01-14 DIAGNOSIS — M1990 Unspecified osteoarthritis, unspecified site: Secondary | ICD-10-CM | POA: Diagnosis present

## 2012-01-14 DIAGNOSIS — J449 Chronic obstructive pulmonary disease, unspecified: Secondary | ICD-10-CM

## 2012-01-14 DIAGNOSIS — F17200 Nicotine dependence, unspecified, uncomplicated: Secondary | ICD-10-CM | POA: Diagnosis present

## 2012-01-14 DIAGNOSIS — I5023 Acute on chronic systolic (congestive) heart failure: Secondary | ICD-10-CM | POA: Diagnosis present

## 2012-01-14 DIAGNOSIS — Z888 Allergy status to other drugs, medicaments and biological substances status: Secondary | ICD-10-CM

## 2012-01-14 DIAGNOSIS — I200 Unstable angina: Secondary | ICD-10-CM | POA: Diagnosis present

## 2012-01-14 DIAGNOSIS — F411 Generalized anxiety disorder: Secondary | ICD-10-CM | POA: Diagnosis present

## 2012-01-14 DIAGNOSIS — I2582 Chronic total occlusion of coronary artery: Secondary | ICD-10-CM | POA: Diagnosis present

## 2012-01-14 DIAGNOSIS — Y840 Cardiac catheterization as the cause of abnormal reaction of the patient, or of later complication, without mention of misadventure at the time of the procedure: Secondary | ICD-10-CM | POA: Diagnosis not present

## 2012-01-14 DIAGNOSIS — I252 Old myocardial infarction: Secondary | ICD-10-CM

## 2012-01-14 DIAGNOSIS — I253 Aneurysm of heart: Secondary | ICD-10-CM | POA: Diagnosis present

## 2012-01-14 DIAGNOSIS — J4489 Other specified chronic obstructive pulmonary disease: Secondary | ICD-10-CM | POA: Diagnosis present

## 2012-01-14 DIAGNOSIS — E7800 Pure hypercholesterolemia, unspecified: Secondary | ICD-10-CM | POA: Diagnosis present

## 2012-01-14 DIAGNOSIS — F419 Anxiety disorder, unspecified: Secondary | ICD-10-CM

## 2012-01-14 DIAGNOSIS — IMO0002 Reserved for concepts with insufficient information to code with codable children: Secondary | ICD-10-CM | POA: Diagnosis not present

## 2012-01-14 DIAGNOSIS — E876 Hypokalemia: Secondary | ICD-10-CM | POA: Diagnosis not present

## 2012-01-14 HISTORY — DX: Unspecified atrial fibrillation: I48.91

## 2012-01-14 LAB — PROTIME-INR
Prothrombin Time: 24.2 seconds — ABNORMAL HIGH (ref 11.6–15.2)
Prothrombin Time: 24 seconds — ABNORMAL HIGH (ref 11.6–15.2)

## 2012-01-14 LAB — CBC
HCT: 46.3 % (ref 39.0–52.0)
MCHC: 36.1 g/dL — ABNORMAL HIGH (ref 30.0–36.0)
MCV: 97.3 fL (ref 78.0–100.0)
Platelets: 221 10*3/uL (ref 150–400)
RDW: 14.4 % (ref 11.5–15.5)
WBC: 7.8 10*3/uL (ref 4.0–10.5)

## 2012-01-14 LAB — CARDIAC PANEL(CRET KIN+CKTOT+MB+TROPI)
CK, MB: 3.6 ng/mL (ref 0.3–4.0)
Total CK: 41 U/L (ref 7–232)

## 2012-01-14 LAB — BASIC METABOLIC PANEL
BUN: 12 mg/dL (ref 6–23)
Chloride: 99 mEq/L (ref 96–112)
Creatinine, Ser: 1.03 mg/dL (ref 0.50–1.35)
GFR calc Af Amer: 79 mL/min — ABNORMAL LOW (ref >90)
GFR calc non Af Amer: 68 mL/min — ABNORMAL LOW (ref >90)
Potassium: 3.7 mEq/L (ref 3.5–5.1)

## 2012-01-14 LAB — PRO B NATRIURETIC PEPTIDE: Pro B Natriuretic peptide (BNP): 1942 pg/mL — ABNORMAL HIGH (ref 0–450)

## 2012-01-14 MED ORDER — ACETAMINOPHEN 325 MG PO TABS: 650.0000 mg | ORAL_TABLET | ORAL | Status: DC | PRN

## 2012-01-14 MED ORDER — POTASSIUM CHLORIDE CRYS ER 20 MEQ PO TBCR
20.0000 meq | EXTENDED_RELEASE_TABLET | Freq: Every day | ORAL | Status: DC
  Administered 2012-01-15 – 2012-01-22 (×7): 20 meq via ORAL
  Filled 2012-01-14 (×9): qty 1

## 2012-01-14 MED ORDER — HYDROCODONE-ACETAMINOPHEN 5-325 MG PO TABS
1.0000 | ORAL_TABLET | Freq: Four times a day (QID) | ORAL | Status: DC | PRN
  Filled 2012-01-14: qty 1

## 2012-01-14 MED ORDER — NON FORMULARY: 10.0000 mg | Freq: Every day | Status: DC

## 2012-01-14 MED ORDER — ASPIRIN 81 MG PO TABS: 81.0000 mg | ORAL_TABLET | Freq: Every day | ORAL | Status: DC

## 2012-01-14 MED ORDER — SODIUM BICARBONATE 650 MG PO TABS
650.0000 mg | ORAL_TABLET | Freq: Every day | ORAL | Status: DC
  Administered 2012-01-15 – 2012-01-22 (×7): 650 mg via ORAL
  Filled 2012-01-14 (×8): qty 1

## 2012-01-14 MED ORDER — THEOPHYLLINE ER 300 MG PO TB12: 300.0000 mg | ORAL_TABLET | Freq: Every day | ORAL | Status: DC

## 2012-01-14 MED ORDER — TIOTROPIUM BROMIDE MONOHYDRATE 18 MCG IN CAPS
1.0000 | ORAL_CAPSULE | Freq: Every day | RESPIRATORY_TRACT | Status: DC
  Administered 2012-01-15 – 2012-01-19 (×5): 18 ug via RESPIRATORY_TRACT
  Filled 2012-01-14 (×3): qty 5

## 2012-01-14 MED ORDER — ASPIRIN EC 81 MG PO TBEC
81.0000 mg | DELAYED_RELEASE_TABLET | Freq: Every day | ORAL | Status: AC
  Administered 2012-01-15 – 2012-01-19 (×5): 81 mg via ORAL
  Filled 2012-01-14 (×5): qty 1

## 2012-01-14 MED ORDER — OMEGA-3 FATTY ACIDS 1000 MG PO CAPS: 2.0000 g | ORAL_CAPSULE | Freq: Every day | ORAL | Status: DC

## 2012-01-14 MED ORDER — NITROGLYCERIN IN D5W 200-5 MCG/ML-% IV SOLN
5.0000 ug/min | INTRAVENOUS | Status: DC
  Administered 2012-01-14: 20:00:00 5 ug/min via INTRAVENOUS
  Filled 2012-01-14 (×2): qty 250

## 2012-01-14 MED ORDER — SODIUM CHLORIDE 0.9 % IV SOLN
250.0000 mL | INTRAVENOUS | Status: DC | PRN
  Administered 2012-01-14: 250 mL via INTRAVENOUS

## 2012-01-14 MED ORDER — THEOPHYLLINE ER 300 MG PO TB12
300.0000 mg | ORAL_TABLET | Freq: Every day | ORAL | Status: DC
  Administered 2012-01-15 – 2012-01-22 (×7): 300 mg via ORAL
  Filled 2012-01-14 (×8): qty 1

## 2012-01-14 MED ORDER — CALCIUM CARBONATE 1250 (500 CA) MG PO TABS
1.0000 | ORAL_TABLET | Freq: Every day | ORAL | Status: DC
  Administered 2012-01-15 – 2012-01-22 (×7): 500 mg via ORAL
  Filled 2012-01-14 (×8): qty 1

## 2012-01-14 MED ORDER — GUAIFENESIN ER 600 MG PO TB12
1200.0000 mg | ORAL_TABLET | Freq: Two times a day (BID) | ORAL | Status: DC | PRN
  Filled 2012-01-14 (×2): qty 2

## 2012-01-14 MED ORDER — RANOLAZINE ER 500 MG PO TB12
500.0000 mg | ORAL_TABLET | Freq: Two times a day (BID) | ORAL | Status: DC
  Administered 2012-01-14 – 2012-01-22 (×15): 500 mg via ORAL
  Filled 2012-01-14 (×18): qty 1

## 2012-01-14 MED ORDER — OMEGA-3-ACID ETHYL ESTERS 1 G PO CAPS
2.0000 g | ORAL_CAPSULE | Freq: Every day | ORAL | Status: DC
  Administered 2012-01-15 – 2012-01-22 (×7): 2 g via ORAL
  Filled 2012-01-14 (×8): qty 2

## 2012-01-14 MED ORDER — ROSUVASTATIN CALCIUM 10 MG PO TABS
10.0000 mg | ORAL_TABLET | Freq: Every day | ORAL | Status: DC
  Administered 2012-01-14 – 2012-01-21 (×7): 10 mg via ORAL
  Filled 2012-01-14 (×11): qty 1

## 2012-01-14 MED ORDER — ONDANSETRON HCL 4 MG/2ML IJ SOLN: 4.0000 mg | Freq: Four times a day (QID) | INTRAMUSCULAR | Status: DC | PRN

## 2012-01-14 MED ORDER — NITROGLYCERIN 0.4 MG SL SUBL
0.4000 mg | SUBLINGUAL_TABLET | SUBLINGUAL | Status: DC | PRN
  Filled 2012-01-14: qty 25

## 2012-01-14 MED ORDER — SODIUM CHLORIDE 0.9 % IJ SOLN
3.0000 mL | Freq: Two times a day (BID) | INTRAMUSCULAR | Status: DC
  Administered 2012-01-15 – 2012-01-21 (×4): 3 mL via INTRAVENOUS

## 2012-01-14 MED ORDER — FUROSEMIDE 40 MG PO TABS
60.0000 mg | ORAL_TABLET | Freq: Every day | ORAL | Status: DC
  Administered 2012-01-15 – 2012-01-22 (×7): 60 mg via ORAL
  Filled 2012-01-14 (×8): qty 1

## 2012-01-14 MED ORDER — MECLIZINE HCL 25 MG PO TABS
25.0000 mg | ORAL_TABLET | Freq: Three times a day (TID) | ORAL | Status: DC | PRN
  Filled 2012-01-14: qty 1

## 2012-01-14 MED ORDER — VALSARTAN-HYDROCHLOROTHIAZIDE 160-25 MG PO TABS: 1.0000 | ORAL_TABLET | Freq: Every day | ORAL | Status: DC

## 2012-01-14 MED ORDER — ALPRAZOLAM 0.25 MG PO TABS
0.2500 mg | ORAL_TABLET | Freq: Four times a day (QID) | ORAL | Status: DC | PRN
  Administered 2012-01-14 – 2012-01-19 (×5): 0.25 mg via ORAL
  Filled 2012-01-14 (×5): qty 1

## 2012-01-14 MED ORDER — METOPROLOL TARTRATE 50 MG PO TABS
50.0000 mg | ORAL_TABLET | Freq: Two times a day (BID) | ORAL | Status: DC
  Administered 2012-01-14 – 2012-01-22 (×15): 50 mg via ORAL
  Filled 2012-01-14 (×18): qty 1

## 2012-01-14 MED ORDER — LEVALBUTEROL TARTRATE 45 MCG/ACT IN AERO
1.0000 | INHALATION_SPRAY | RESPIRATORY_TRACT | Status: DC | PRN
  Filled 2012-01-14: qty 15

## 2012-01-14 MED ORDER — ASPIRIN 81 MG PO CHEW
324.0000 mg | CHEWABLE_TABLET | Freq: Once | ORAL | Status: AC
  Administered 2012-01-14: 324 mg via ORAL
  Filled 2012-01-14: qty 4

## 2012-01-14 MED ORDER — SODIUM CHLORIDE 0.9 % IJ SOLN: 3.0000 mL | INTRAMUSCULAR | Status: DC | PRN

## 2012-01-14 MED ORDER — IRBESARTAN 150 MG PO TABS
150.0000 mg | ORAL_TABLET | Freq: Every day | ORAL | Status: DC
  Administered 2012-01-15 – 2012-01-22 (×7): 150 mg via ORAL
  Filled 2012-01-14 (×8): qty 1

## 2012-01-14 MED ORDER — HYDROCHLOROTHIAZIDE 25 MG PO TABS
25.0000 mg | ORAL_TABLET | Freq: Every day | ORAL | Status: DC
  Administered 2012-01-15 – 2012-01-22 (×7): 25 mg via ORAL
  Filled 2012-01-14 (×8): qty 1

## 2012-01-14 NOTE — ED Notes (Signed)
Report given to Michelle RN

## 2012-01-14 NOTE — ED Notes (Addendum)
Pt was received to POD 4 with c/o chest pain onset last night with numbness to lt arm, shorness of breath. Pt claimed that he took NTG SL last night and this morning which helped for 1 hour. Pt denies any chest at present. Pt is attached to the monitor, O2 started at 2 LPM/Winter. Felicita Gage PAC is at the bedside

## 2012-01-14 NOTE — Telephone Encounter (Signed)
Would agree. Patient of Dr. Yevonne Pax.

## 2012-01-14 NOTE — ED Provider Notes (Signed)
History     CSN: [id number]  Arrival date & time 01/14/12  1021   First MD Initiated Contact with Patient 01/14/12 1037      Chief Complaint  Patient presents with  . Chest Pain  . Numbness    (Consider location/radiation/quality/duration/timing/severity/associated sxs/prior treatment) HPI Comments: Patient with history of MI approximately 20 years ago, no recent catheterization, most recent echocardiogram shows EF=30%, chronic chest pain on Imdur and NTG, COPD, smoking -- presents with typical chest pain symptoms. Patient notes several episodes of left-sided chest pain between last evening and this morning. The patient took 5 nitroglycerin during that time with some relief. He currently denies chest pain. Patient had left arm and left shoulder numbness which is unusual for him. He called Dr. Yevonne Pax office and was told to come to the emergency department for evaluation. Patient denies radiation of pain. He denies palpitations or lightheadedness. Patient has shortness of breath due to COPD which is at his baseline. He denies fever, abdominal pain, nausea/vomiting/diarrhea, swelling in the lower extremities. The pain does not seem to be worsened with activity or eating. Patient takes 81 mg aspirin at night however has not had any today.  Patient is a 78 y.o. male presenting with chest pain. The history is provided by the patient and medical records.  Chest Pain The chest pain began 1 - 2 hours ago. Duration of episode(s) is 10 minutes. Chest pain occurs intermittently. The chest pain is resolved. The severity of the pain is mild. The quality of the pain is described as aching. The pain does not radiate. Primary symptoms include shortness of breath. Pertinent negatives for primary symptoms include no fever, no syncope, no cough, no wheezing, no palpitations, no abdominal pain, no nausea and no vomiting.  Associated symptoms include diaphoresis. He tried nitroglycerin for the symptoms.    Procedure history is positive for cardiac catheterization and echocardiogram.     Past Medical History  Diagnosis Date  . Coronary artery disease     MI 57  . COPD (chronic obstructive pulmonary disease)   . Hyperlipidemia   . Hypertension   . Apical myocardial infarction 1981    with left ventricular apical aneurysm  . Long-term (current) use of anticoagulants   . Myocardial infarct 1979  . LVH (left ventricular hypertrophy)     Past Surgical History  Procedure Date  . Coronary angioplasty   . Cholecystectomy   . Hernia repair     Family History  Problem Relation Age of Onset  . Heart disease Mother   . Cerebral palsy Daughter     History  Substance Use Topics  . Smoking status: Current Some Day Smoker -- 0.5 packs/day    Types: Cigarettes  . Smokeless tobacco: Not on file  . Alcohol Use: No      Review of Systems  Constitutional: Positive for diaphoresis. Negative for fever.  HENT: Negative for neck pain.   Eyes: Negative for redness.  Respiratory: Positive for shortness of breath. Negative for cough and wheezing.   Cardiovascular: Positive for chest pain. Negative for palpitations, leg swelling and syncope.  Gastrointestinal: Negative for nausea, vomiting and abdominal pain.  Genitourinary: Negative for dysuria.  Musculoskeletal: Negative for back pain.  Skin: Negative for rash.  Neurological: Negative for syncope and light-headedness.    Allergies  Indocin; Lipitor; Pravachol; Procardia; and Zaroxolyn  Home Medications   Current Outpatient Rx  Name Route Sig Dispense Refill  . ALBUTEROL SULFATE HFA 108 (90 BASE) MCG/ACT IN AERS  Inhalation Inhale 2 puffs into the lungs every 6 (six) hours as needed for wheezing. 1 Inhaler 5  . ALPRAZOLAM 0.25 MG PO TABS Oral Take 1 tablet (0.25 mg total) by mouth every 6 (six) hours as needed for anxiety. 30 tablet 5  . ASPIRIN 81 MG PO TABS Oral Take 81 mg by mouth daily.      Marland Kitchen CALCIUM CARBONATE 1250 MG PO TABS  Oral Take 1 tablet by mouth daily.    Marland Kitchen CALCIUM CARBONATE 200 MG PO CAPS Oral Take 500 mg by mouth daily.      . OMEGA-3 FATTY ACIDS 1000 MG PO CAPS Oral Take 2 g by mouth daily.      . FUROSEMIDE 40 MG PO TABS Oral Take 60 mg by mouth daily.     . GUAIFENESIN ER 600 MG PO TB12 Oral Take 1,200 mg by mouth 2 (two) times daily as needed.      Marland Kitchen HYDROCODONE-ACETAMINOPHEN 5-500 MG PO TABS Oral Take 1 tablet by mouth every 6 (six) hours as needed.      . ISOSORBIDE MONONITRATE ER 60 MG PO TB24 Oral Take 1 tablet (60 mg total) by mouth 2 (two) times daily. 60 tablet 11    New dose  . LEVALBUTEROL TARTRATE 45 MCG/ACT IN AERO Inhalation Inhale 1-2 puffs into the lungs every 4 (four) hours as needed. For shortness of breath    . MECLIZINE HCL 25 MG PO TABS Oral Take 25 mg by mouth 3 (three) times daily as needed. For dizziness    . METOPROLOL SUCCINATE ER 50 MG PO TB24 Oral Take 50 mg by mouth 2 (two) times daily. Take with or immediately following a meal.    . POTASSIUM CHLORIDE CRYS ER 20 MEQ PO TBCR Oral Take 20 mEq by mouth daily.      Marland Kitchen RANOLAZINE ER 500 MG PO TB12 Oral Take 1 tablet (500 mg total) by mouth 2 (two) times daily. 60 tablet 3  . ROSUVASTATIN CALCIUM 10 MG PO TABS Oral Take 10 mg by mouth daily.    . SODIUM BICARBONATE 650 MG PO TABS Oral Take 650 mg by mouth daily.     Marland Kitchen SPIRIVA HANDIHALER 18 MCG IN CAPS Inhalation Place 1 capsule into inhaler and inhale Daily.    Marland Kitchen TEMAZEPAM 30 MG PO CAPS Oral Take 30 mg by mouth at bedtime as needed. For sleep    . THEOPHYLLINE 300 MG PO TB12 Oral Take 1 tablet (300 mg total) by mouth daily. 30 tablet 11    300mg  q24hours  . VALSARTAN-HYDROCHLOROTHIAZIDE 160-25 MG PO TABS Oral Take 1 tablet by mouth daily.     . WARFARIN SODIUM 5 MG PO TABS Oral Take 2.5-5 mg by mouth daily at 6 PM. Take 1 tablet on Mon Tue, Wed,Fri,Sat . Take 0.5 tablet on Sun, Thur.    . NITROGLYCERIN 0.4 MG SL SUBL Sublingual Place 0.4 mg under the tongue every 5 (five) minutes as  needed.        BP 158/98  Pulse 64  Temp(Src) 97.6 F (36.4 C) (Oral)  Resp 12  Ht 5\' 7"  (1.702 m)  Wt 182 lb (82.555 kg)  BMI 28.51 kg/m2  SpO2 96%  Physical Exam  Nursing note and vitals reviewed. Constitutional: He is oriented to person, place, and time. He appears well-developed and well-nourished.  HENT:  Head: Normocephalic and atraumatic.  Mouth/Throat: Oropharynx is clear and moist.  Eyes: Conjunctivae are normal. Right eye exhibits no discharge. Left eye  exhibits no discharge.  Neck: Normal range of motion. Neck supple. No JVD present.  Cardiovascular: Normal rate, regular rhythm, normal heart sounds and intact distal pulses.   No murmur heard. Pulmonary/Chest: Effort normal. No accessory muscle usage. No respiratory distress. He has decreased breath sounds. He has wheezes. He has rhonchi. He has no rales. He exhibits no tenderness.  Abdominal: Soft. There is no tenderness.  Neurological: He is alert and oriented to person, place, and time.  Skin: Skin is warm and dry.  Psychiatric: He has a normal mood and affect.    ED Course  Procedures (including critical care time)  Labs Reviewed  CBC - Abnormal; Notable for the following:    MCH 35.1 (*)    MCHC 36.1 (*)    All other components within normal limits  BASIC METABOLIC PANEL - Abnormal; Notable for the following:    Glucose, Bld 120 (*)    GFR calc non Af Amer 68 (*)    GFR calc Af Amer 79 (*)    All other components within normal limits  PRO B NATRIURETIC PEPTIDE - Abnormal; Notable for the following:    Pro B Natriuretic peptide (BNP) 1942.0 (*)    All other components within normal limits  POCT I-STAT TROPONIN I  PROTIME-INR   Dg Chest 2 View  01/14/2012  *RADIOLOGY REPORT*  Clinical Data: Left-sided chest pain, left arm numbness, cough and congestion, smoking his  CHEST - 2 VIEW  Comparison: It chest x-ray of 07/11/2010  Findings: Previously described scarring in the lingula appears stable.  No active  infiltrate or effusion is seen.  The lungs remain slightly hyperaerated.  Mild cardiomegaly is stable.  Median sternotomy sutures are stable.  There are degenerative changes throughout the thoracic spine.  IMPRESSION: No active lung disease.  Stable scarring in the lingula with no change in hyperaeration.  Original Report Authenticated By: Juline Patch, M.D.     1. Ischemic heart disease   2. COPD (chronic obstructive pulmonary disease)   3. Anxiety   4. Chest pain   5. Intermediate coronary syndrome   6. Tobacco abuse     10:55 AM Patient seen and examined. Work-up initiated. Medications ordered.   Vital signs reviewed and are as follows: Filed Vitals:   01/14/12 1049  BP: 158/98  Pulse: 64  Temp:   Resp: 12   Date: 01/14/2012  Rate: 68  Rhythm: normal sinus rhythm  QRS Axis: right  Intervals: normal  ST/T Wave abnormalities: nonspecific ST/T changes  Conduction Disutrbances:right bundle branch block  Narrative Interpretation: Q-waves noted  Old EKG Reviewed: changes noted (08/28/2011) T-waves: more significant flattening III, V1, V2, pre-existing t-wave inversion V4-V6  12:02 PM Patient was discussed with Ward Givens, MD  1:11 PM Cardiology notified and will see patient in ED.   Patient admitted by cards.   MDM  Admit for CP, ?unstable angina        Carolee Rota, [place] 01/14/12 1539

## 2012-01-14 NOTE — Telephone Encounter (Signed)
Agree.  The patient needs to go to the emergency room.

## 2012-01-14 NOTE — ED Provider Notes (Signed)
See prior note   Ward Givens, MD 01/14/12 1919

## 2012-01-14 NOTE — Progress Notes (Signed)
ANTICOAGULATION CONSULT NOTE - Initial Consult  Pharmacy Consult for Heparin Indication: unstable angina and hx of LV apical aneurysm  Allergies  Allergen Reactions  . Indocin     unknown  . Lipitor (Atorvastatin Calcium)     Leg pain  . Pravachol     Leg pain  . Procardia (Nifedipine)   . Zaroxolyn (Metolazone)     Patient Measurements: Height: 5\' 7"  (170.2 cm) Weight: 182 lb (82.555 kg) IBW/kg (Calculated) : 66.1  Heparin Dosing Weight: 82kg  Vital Signs: Temp: 97.4 F (36.3 C) (02/26 1716) Temp src: Oral (02/26 1716) BP: 154/83 mmHg (02/26 1716) Pulse Rate: 69  (02/26 1716)  Labs:  Basename 01/14/12 1809 01/14/12 1105 01/14/12 1100  HGB -- 16.7 --  HCT -- 46.3 --  PLT -- 221 --  APTT 44* -- --  LABPROT 24.0* -- 24.2*  INR 2.11* -- 2.13*  HEPARINUNFRC -- -- --  CREATININE -- 1.03 --  CKTOTAL -- -- --  CKMB -- -- --  TROPONINI -- -- --   Estimated Creatinine Clearance: 62.7 ml/min (by C-G formula based on Cr of 1.03).  Medical History: Past Medical History  Diagnosis Date  . 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 Has chronic angina  . COPD (chronic obstructive pulmonary disease)     Uses 2.5L O2 nightly  . Hyperlipidemia   . Hypertension   . Long-term (current) use of anticoagulants     For hx of LV apical aneurysm  . LVH (left ventricular hypertrophy)   . Atrial fibrillation     Unclear history    Medications:  Scheduled:    . aspirin  324 mg Oral Once  . aspirin EC  81 mg Oral Daily  . calcium carbonate  1 tablet Oral Daily  . furosemide  60 mg Oral Daily  . hydrochlorothiazide  25 mg Oral Daily  . irbesartan  150 mg Oral Daily  . metoprolol  50 mg Oral BID  . omega-3 acid ethyl esters  2 g Oral Daily  . potassium chloride SA  20 mEq Oral Daily  . ranolazine  500 mg Oral BID  . rosuvastatin  10 mg Oral q1800  . sodium  bicarbonate  650 mg Oral Daily  . sodium chloride  3 mL Intravenous Q12H  . theophylline  300 mg Oral Daily  . tiotropium  1 capsule Inhalation Daily  . DISCONTD: aspirin  81 mg Oral Daily  . DISCONTD: fish oil-omega-3 fatty acids  2 g Oral Daily  . DISCONTD: NON FORMULARY 10 mg  10 mg Oral Daily  . DISCONTD: theophylline  300 mg Oral Daily  . DISCONTD: valsartan-hydrochlorothiazide  1 tablet Oral Daily    Assessment: 50 YOM with known history of CAD, also on coumadin PTA for h/o LV apical aneurysm, who presented with chest pain caused by unstable angina. Cardiac enzyme negative x 1, Will likely need cath, coumadin is on hold for now. Pharmacy is consulted to start IV heparin when INR < 2. INR was 2.13 at 11am, recheck at 1800 was still 2.11. Last coumadin dose was yesterday. No vitamin K ordered.   Goal of Therapy:  Start IV heparin when INR < 2   Plan:  - recheck INR with am labs - start heparin if INR < 2  Riki Rusk 01/14/2012,7:02 PM

## 2012-01-14 NOTE — H&P (Signed)
History and Physical  Patient ID: YIDEL TEUSCHER MRN: [id number], SOB: March 11, 1933 78 y.o. Date of Encounter: 01/14/2012, 1:55 PM  Primary Physician: Ralene Ok, MD, MD Primary Cardiologist: Dr. 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 He typically takes 4-5 nitroglycerin per day. Last night around 10pm, when he went to bed he had a left shoulder discomfort relieved with 1 NTG. However, he awoke at 1:30 am with left-sided sharp chest pain that he feels was consistent with usual anginal symptoms with associated shortness of breath, arm numbness, and diaphoresis. This episode was more severe and "hurt like crazy." It was relieved with NTG, but recurred several times the remainder of the night requiring more nitroglycerin. Around 8 a.m., he called the pharmacy to refill his NTG and was advised to call his cardiologist given excessive NTG use - he did so and was advised to go to the ER. He denies any fever, chills, dizziness or syncope. His symptoms primarily occur at night and yesterday during the day he was able to go to the barber and grocery store without symptoms. POC troponin is negative and EKG is without acute changes. He reports compliance with his medications. Last episode of CP was at 6 am. He is currently CP free and says he wants to get dressed and go eat a hot dog.  In the past, he did not want to pursue catheterization because of his first experience with it in 1981 when he states "they lost me, and had to bring me back with paddles." He describes CPR and intense rib pain after discharge at that time and since then, has been worried about the idea of repeat cath but states "if that's what they think I need, then I can do it."  Past Medical History  Diagnosis Date  . 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 Has chronic angina  . COPD (chronic obstructive pulmonary disease)   . Hyperlipidemia   . Hypertension   . Long-term (current) use of anticoagulants   . LVH (left ventricular hypertrophy)   . Atrial fibrillation   Note that afib is listed in his epic chart but I do not find formal documentation of this occuring.    Last nuc 08/2011 Overall Impression: Moderate sized anteroapical wall infarct. Smaller inferolateral wall infarct at the apex with mild periinfarct ischemia of the inferolateral wall at mid and basal level. EF 30%  Last echo was reportedly in 2007, report unavailable.  Surgical History:  Past Surgical History  Procedure Date  . Coronary angioplasty   . Cholecystectomy   . Hernia repair      Home Meds: Medication Sig  albuterol (PROAIR HFA) 108 (90 BASE) MCG/ACT inhaler Inhale 2 puffs into the lungs every 6 (six) hours as needed for wheezing.  ALPRAZolam (XANAX) 0.25 MG tablet Take 1 tablet (0.25 mg total) by mouth every 6 (six) hours as needed for anxiety.  aspirin 81 MG tablet Take 81 mg by mouth daily.    calcium carbonate (OS-CAL - DOSED IN MG OF ELEMENTAL CALCIUM) 1250 MG tablet Take 1 tablet by mouth daily.  calcium carbonate 200 MG capsule Take 500 mg by mouth daily.    fish oil-omega-3 fatty acids 1000 MG capsule Take 2 g by mouth daily.  furosemide (LASIX) 40 MG tablet Take 60 mg by mouth daily.   guaiFENesin (MUCINEX) 600 MG 12 hr tablet Take 1,200 mg by mouth 2 (two) times daily as needed.    HYDROcodone-acetaminophen (VICODIN) 5-500 MG per tablet Take 1 tablet by mouth every 6 (six) hours as needed.    isosorbide mononitrate (IMDUR) 60 MG 24 hr tablet Take 1 tablet (60 mg total) by mouth 2 (two) times daily.  levalbuterol (XOPENEX HFA) 45 MCG/ACT inhaler Inhale 1-2 puffs into the lungs every 4 (four) hours as needed. For shortness of breath  meclizine (ANTIVERT) 25 MG tablet Take 25 mg by mouth 3 (three) times daily  as needed. For dizziness  metoprolol (LOPRESSOR) 50 MG tablet Take 50 mg by mouth 2 (two) times daily.  potassium chloride SA (K-DUR,KLOR-CON) 20 MEQ tablet Take 20 mEq by mouth daily.    ranolazine (RANEXA) 500 MG 12 hr tablet Take 1 tablet (500 mg total) by mouth 2 (two) times daily.  rosuvastatin (CRESTOR) 10 MG tablet Take 10 mg by mouth daily.  sodium bicarbonate 650 MG tablet Take 650 mg by mouth daily.   SPIRIVA HANDIHALER 18 MCG inhalation capsule Place 1 capsule into inhaler and inhale Daily.  temazepam (RESTORIL) 30 MG capsule Take 30 mg by mouth at bedtime as needed. For sleep  theophylline (THEODUR) 300 MG 12 hr tablet Take 1 tablet (300 mg total) by mouth daily.  valsartan-hydrochlorothiazide (DIOVAN-HCT) 160-25 MG per tablet Take 1 tablet by mouth daily.   warfarin (COUMADIN) 5 MG tablet Take 2.5-5 mg by mouth daily at 6 PM. Take 1 tablet on Mon Tue, Wed,Fri,Sat . Take 0.5 tablet on Sun, Thur.  nitroGLYCERIN (NITROSTAT) 0.4 MG SL tablet Place 0.4 mg under the tongue every 5 (five) minutes as needed.      Allergies:  Allergies  Allergen Reactions  . Indocin     unknown  . Lipitor (Atorvastatin Calcium)     Leg pain  . Pravachol     Leg pain  . Procardia (Nifedipine)   . Zaroxolyn (Metolazone)     History   Social History  . Marital Status: Widowed    Spouse Name: N/A    Number of Children: N/A  . Years of Education: N/A   Occupational History  . Not on file.   Social History Main Topics  . Smoking status: Current Some Day Smoker -- 0.5 packs/day    Types: Cigarettes  . Smokeless tobacco: Not on file  . Alcohol Use: No  . Drug Use: No  . Sexually Active: Not Currently   Other Topics Concern  . Not on file   Social History Narrative  . No narrative on file     Family History  Problem Relation Age of Onset  . Heart disease Mother   . Cerebral palsy Daughter     Review of Systems: General: negative for chills, fever, night sweats or weight changes.   Cardiovascular: negative for edema, orthopnea, palpitations, paroxysmal nocturnal dyspnea. See above Dermatological: negative for rash Respiratory: +occasional cough Urologic: negative for hematuria Abdominal: negative for nausea, vomiting, diarrhea, bright red blood per rectum, melena, or hematemesis Neurologic: negative for visual changes, syncope, or dizziness All other systems reviewed and are otherwise negative except as noted above.  Labs:   Lab Results  Component Value Date   WBC 7.8 01/14/2012   HGB 16.7 01/14/2012   HCT 46.3 01/14/2012   MCV 97.3 01/14/2012   PLT 221 01/14/2012    Lab 01/14/12 1105  NA 138  K 3.7  CL 99  CO2 30  BUN 12  CREATININE 1.03  CALCIUM 9.6  PROT --  BILITOT --  ALKPHOS --  ALT --  AST --  GLUCOSE 120*   POC troponin negative thus far  Radiology/Studies:  1. Chest 2 View 01/14/2012  *RADIOLOGY REPORT*  Clinical Data: Left-sided chest pain, left arm numbness, cough and congestion, smoking his  CHEST - 2 VIEW  Comparison: It chest x-ray of 07/11/2010  Findings: Previously described scarring in the lingula appears stable.  No active infiltrate or effusion is seen.  The lungs remain slightly hyperaerated.  Mild cardiomegaly is stable.  Median sternotomy sutures are stable.  There are degenerative changes throughout the thoracic spine.  IMPRESSION: No active lung disease.  Stable scarring in the lingula with no change in hyperaeration.  Original Report Authenticated By: Juline Patch, M.D.    EKG: NSR 68bpm [id number]. Evidence of prior inferior infarct and anterior infarct. <85mm ST elevation V4.  TWI noted V5-V6. Grossly unchanged from 08/2011. Tele showing NSR PVCs PACs  Physical Exam: Blood pressure 130/80, pulse 74, temperature 97.6 F (36.4 C), temperature source Oral, resp. rate 18, height 5\' 7"  (1.702 m), weight 182 lb (82.555 kg), SpO2 96.00%. General: Well developed elderly WM in no acute distress. Head: Normocephalic, atraumatic, sclera  non-icteric, nares are without discharge. Xanthoma noted right eyelid corner.  Neck: Negative for carotid bruits. JVD not elevated. Lungs: Barrel chested Coarse crackly BS at bases. Otherwise clear but air movement is decreased. No wheezes.Breathing is unlabored. Heart: Distant HS RRR with S1 S2. No murmurs, rubs, or gallops appreciated. Abdomen: Soft, non-tender, non-distended with normoactive bowel sounds. No hepatomegaly. No rebound/guarding. No obvious abdominal masses. Msk:  Strength and tone appear normal for age. Extremities: No clubbing or cyanosis. No edema.  Distal pedal pulses are 1+ and equal bilaterally. Neuro: Alert and oriented X 3. Hard of hearing. Moves all extremities spontaneously. Psych:  Responds to questions appropriately with a normal affect.    ASSESSMENT AND PLAN:   1. Chest pain consistent with unstable angina 2. Known CAD 3. H/o LV dysfunction with last EF 30% by nuc and hx of apical aneurysm, on Coumadin 4. HTN 5. COPD with ongoing tobacco use 6. Chronic respiratory failure  Symptoms are compelling for underlying worsening CAD/unstable angina. Will admit to the hospital, cycle enzymes, hold Coumadin in anticipation for eventual cardiac cath which patient has agreed to given severity of his symptoms today. Will start heparin when INR <2. Continue BB, statin, Ranexa. Per Dr. Gala Romney, will start NTG gtt as well and obtain 2D echo. Please see below for our comprehensive thoughts.  Signed, Ronie Spies PA-C 01/14/2012, 1:55 PM  Patient seen and examined with Ronie Spies PA-C. We discussed all aspects of the encounter. I agree with the assessment and plan as stated above. Symptoms very concerning for [place]. Currently pain free with ECG and 1st set CE negative. Will admit to stepdown on IV NTG. Hold coumadin. Start heparin when INR < 2.0. Will need cath. On exam has severe COPD and will not be candidate for CABG so hopefully will have disease amenable to PCI. Need for  smoking cessation discussed.   Sarahy Creedon,MD 3:13 PM

## 2012-01-14 NOTE — Telephone Encounter (Signed)
01/14/12--1000am--pt calling c/o burning chest pain radiating down left shoulder--diaphoresis--sounds slightly SOB--pt states he has taken 5 NTG but pain comes back--advisedto go to [place]--now--pt agrees--nt

## 2012-01-14 NOTE — ED Provider Notes (Cosign Needed)
Patient relates he started having left-sided anterior chest pain about 3-4 days ago. He states it's intermittent and lasts about 5 minutes. He states it's not related to exertion and can happen at rest. He states nothing makes it come on, he relates taking 1 or 2 nitroglycerin will help it go away. He states last night he had the pain and had to take nitroglycerin about every hour for 4-5 doses. He states the nitroglycerin would help short-term but the pain did return. He states last night he also started getting some numbness in his left shoulder, diaphoresis without nausea or vomiting. He denies any chest pain at this time. He relates his cardiologist is Dr. Patty Sermons. He relates he had a cardiac cath done years ago however he arrested on the table and he has not had a cardiac cath since. His PCP is Dr. Azalee Course.  Patient is alert and cooperative he is in no distress at this time.  Medical screening examination/treatment/procedure(s) were conducted as a shared visit with non-physician practitioner(s) and myself.  I personally evaluated the patient during the encounter Devoria Albe, MD, Franz Dell, MD 01/14/12 1601  Ward Givens, MD 01/14/12 1308

## 2012-01-14 NOTE — ED Notes (Signed)
Patient reports he has had chest pain and left arm numbness since last night.  Patient states he called his md and was told to come to Ed. Patient with shortness of breath.  He denies n/v.  Patient has noted cough but has hx of copd

## 2012-01-14 NOTE — ED Notes (Signed)
Placed call for Heart Healthy Diet tray

## 2012-01-14 NOTE — ED Notes (Signed)
[id number] Ready

## 2012-01-14 NOTE — ED Notes (Signed)
Patient remains on monitor and is resting with NAD at this time. Family at bedside.

## 2012-01-14 NOTE — ED Notes (Signed)
Attempted to call report but receiving RN is unavailable

## 2012-01-14 NOTE — ED Notes (Signed)
Pt is waiting for Cardiology to come and see the patient

## 2012-01-14 NOTE — ED Notes (Signed)
Pt denies any chest pain at present, NSR on the monitor. VSS

## 2012-01-14 NOTE — Telephone Encounter (Signed)
New Msg: Pt calling wanting to speak with nurse/MD regarding pt having chest pain last night. Pt took 5 nitro's between 11p last night to 8 am this morning. Pt not c/o chest pain. Pt not c/o any other symptoms.   Pt call transferred to triage.

## 2012-01-15 DIAGNOSIS — I259 Chronic ischemic heart disease, unspecified: Secondary | ICD-10-CM

## 2012-01-15 DIAGNOSIS — E876 Hypokalemia: Secondary | ICD-10-CM | POA: Diagnosis present

## 2012-01-15 DIAGNOSIS — I379 Nonrheumatic pulmonary valve disorder, unspecified: Secondary | ICD-10-CM

## 2012-01-15 LAB — PROTIME-INR
INR: 1.54 — ABNORMAL HIGH (ref 0.00–1.49)
Prothrombin Time: 18.8 seconds — ABNORMAL HIGH (ref 11.6–15.2)

## 2012-01-15 LAB — CARDIAC PANEL(CRET KIN+CKTOT+MB+TROPI)
CK, MB: 3.6 ng/mL (ref 0.3–4.0)
Relative Index: INVALID (ref 0.0–2.5)
Relative Index: INVALID (ref 0.0–2.5)
Total CK: 66 U/L (ref 7–232)
Total CK: 84 U/L (ref 7–232)

## 2012-01-15 LAB — BASIC METABOLIC PANEL
CO2: 28 mEq/L (ref 19–32)
Chloride: 104 mEq/L (ref 96–112)
Glucose, Bld: 100 mg/dL — ABNORMAL HIGH (ref 70–99)
Potassium: 3.4 mEq/L — ABNORMAL LOW (ref 3.5–5.1)
Sodium: 141 mEq/L (ref 135–145)

## 2012-01-15 LAB — CBC
Hemoglobin: 15.1 g/dL (ref 13.0–17.0)
MCH: 33.9 pg (ref 26.0–34.0)
MCV: 96.9 fL (ref 78.0–100.0)
RBC: 4.46 MIL/uL (ref 4.22–5.81)
WBC: 9 10*3/uL (ref 4.0–10.5)

## 2012-01-15 LAB — LIPID PANEL
Cholesterol: 111 mg/dL (ref 0–200)
HDL: 42 mg/dL (ref >39)
Total CHOL/HDL Ratio: 2.6 RATIO
VLDL: 20 mg/dL (ref 0–40)

## 2012-01-15 MED ORDER — ASPIRIN 81 MG PO CHEW
324.0000 mg | CHEWABLE_TABLET | ORAL | Status: AC
  Administered 2012-01-16: 06:00:00 324 mg via ORAL
  Filled 2012-01-15: qty 4

## 2012-01-15 MED ORDER — SODIUM CHLORIDE 0.9 % IV SOLN: 250.0000 mL | INTRAVENOUS | Status: DC | PRN

## 2012-01-15 MED ORDER — HEPARIN (PORCINE) IN NACL 100-0.45 UNIT/ML-% IJ SOLN
1250.0000 [IU]/h | INTRAMUSCULAR | Status: DC
  Administered 2012-01-15: 23:00:00 1250 [IU]/h via INTRAVENOUS
  Filled 2012-01-15 (×2): qty 250

## 2012-01-15 MED ORDER — SODIUM CHLORIDE 0.9 % IV SOLN
1.0000 mL/kg/h | INTRAVENOUS | Status: DC
Start: 2012-01-16 — End: 2012-01-16
  Administered 2012-01-16: 1 mL/kg/h via INTRAVENOUS

## 2012-01-15 MED ORDER — DIAZEPAM 5 MG PO TABS
5.0000 mg | ORAL_TABLET | ORAL | Status: AC
  Administered 2012-01-16: 07:00:00 5 mg via ORAL
  Filled 2012-01-15: qty 1

## 2012-01-15 MED ORDER — HEPARIN BOLUS VIA INFUSION
3000.0000 [IU] | Freq: Once | INTRAVENOUS | Status: AC
  Administered 2012-01-15: 23:00:00 3000 [IU] via INTRAVENOUS
  Filled 2012-01-15: qty 3000

## 2012-01-15 MED ORDER — SODIUM CHLORIDE 0.9 % IJ SOLN: 3.0000 mL | INTRAMUSCULAR | Status: DC | PRN

## 2012-01-15 MED ORDER — SODIUM CHLORIDE 0.9 % IJ SOLN
3.0000 mL | Freq: Two times a day (BID) | INTRAMUSCULAR | Status: DC
  Administered 2012-01-15: 22:00:00 3 mL via INTRAVENOUS

## 2012-01-15 NOTE — Progress Notes (Signed)
*  PRELIMINARY RESULTS* Echocardiogram 2D Echocardiogram has been performed.  Glean Salen [facility] 01/15/2012, 10:52 AM

## 2012-01-15 NOTE — Progress Notes (Signed)
Subjective:  The patient did not have any chest pain overnight.  Remains on IV NTG.  Rhythm is NSR.  Objective:  Vital Signs in the last 24 hours: Temp:  [97.4 F (36.3 C)-98.9 F (37.2 C)] 97.9 F (36.6 C) (02/27 0430) Pulse Rate:  [56-80] 66  (02/27 0430) Resp:  [12-25] 20  (02/27 0430) BP: (127-158)/(56-98) 130/81 mmHg (02/27 0430) SpO2:  [92 %-98 %] 92 % (02/27 0430) Weight:  [182 lb (82.555 kg)] 182 lb (82.555 kg) (02/26 1031)  Intake/Output from previous day: 02/26 0701 - 02/27 0700 In: 13.9 [I.V.:13.9] Out: 850 [Urine:850] Intake/Output from this shift:       . aspirin  324 mg Oral Once  . aspirin EC  81 mg Oral Daily  . calcium carbonate  1 tablet Oral Daily  . furosemide  60 mg Oral Daily  . hydrochlorothiazide  25 mg Oral Daily  . irbesartan  150 mg Oral Daily  . metoprolol  50 mg Oral BID  . omega-3 acid ethyl esters  2 g Oral Daily  . potassium chloride SA  20 mEq Oral Daily  . ranolazine  500 mg Oral BID  . rosuvastatin  10 mg Oral q1800  . sodium bicarbonate  650 mg Oral Daily  . sodium chloride  3 mL Intravenous Q12H  . theophylline  300 mg Oral Daily  . tiotropium  1 capsule Inhalation Daily  . DISCONTD: aspirin  81 mg Oral Daily  . DISCONTD: fish oil-omega-3 fatty acids  2 g Oral Daily  . DISCONTD: NON FORMULARY 10 mg  10 mg Oral Daily  . DISCONTD: theophylline  300 mg Oral Daily  . DISCONTD: valsartan-hydrochlorothiazide  1 tablet Oral Daily      . nitroGLYCERIN 5 mcg/min (01/15/12 0600)    Physical Exam: The patient appears to be in no distress.  Head and neck exam reveals that the pupils are equal and reactive.  The extraocular movements are full.  There is no scleral icterus.  Mouth and pharynx are benign.  No lymphadenopathy.  No carotid bruits.  The jugular venous pressure is normal.  Thyroid is not enlarged or tender.  Chest reveals diffuse coarse rhonchi and wheezing.  Heart reveals no abnormal lift or heave.  First and second  heart sounds are normal.  There is no murmur gallop rub or click.  The abdomen is soft and nontender.  Bowel sounds are normoactive.  There is no hepatosplenomegaly or mass.  There are no abdominal bruits.  Extremities reveal no phlebitis or edema.  Pedal pulses are 1 plus PT pulse bilat.  There is no cyanosis or clubbing.  Neurologic exam is normal strength and no lateralizing weakness.  No sensory deficits.  Integument reveals no rash  Lab Results:  Basename 01/15/12 0542 01/14/12 1105  WBC 9.0 7.8  HGB 15.1 16.7  PLT 207 221    Basename 01/15/12 0542 01/14/12 1105  NA 141 138  K 3.4* 3.7  CL 104 99  CO2 28 30  GLUCOSE 100* 120*  BUN 14 12  CREATININE 1.07 1.03    Basename 01/15/12 0542 01/14/12 2340  TROPONINI <0.30 <0.30   Hepatic Function Panel No results found for this basename: PROT,ALBUMIN,AST,ALT,ALKPHOS,BILITOT,BILIDIR,IBILI in the last 72 hours  Basename 01/15/12 0542  CHOL 111   No results found for this basename: PROTIME in the last 72 hours  Imaging: Imaging results have been reviewed  Cardiac Studies: 2D echo pending. Assessment/Plan:  Patient Active Hospital Problem List: Intermediate coronary syndrome (  01/14/2012)   Assessment: Pain presently controlled on IV NTG   Plan: Cardiac cath tomorrow anticipating INR will be down to 1.5 range by then. COPD   Assessment:  No respiratory distress but loud rhonchi and wheeze   Plan: Add Mucinex.  Smoking cessation. Hypokalemia   Assessment:  K 3.4   Plan:  Replete K   LOS: 1 day    Sean Figueroa 01/15/2012, 8:00 AM

## 2012-01-15 NOTE — Progress Notes (Signed)
ANTICOAGULATION CONSULT NOTE - Initial Consult  Pharmacy Consult for Heparin Indication: unstable angina and hx of LV apical aneurysm  Allergies  Allergen Reactions  . Indocin     unknown  . Lipitor (Atorvastatin Calcium)     Leg pain  . Pravachol     Leg pain  . Procardia (Nifedipine)   . Zaroxolyn (Metolazone)     Patient Measurements: Height: 5\' 7"  (170.2 cm) Weight: 182 lb (82.555 kg) IBW/kg (Calculated) : 66.1  Heparin Dosing Weight: 82kg  Vital Signs: Temp: 98.1 F (36.7 C) (02/27 1930) Temp src: Oral (02/27 1930) BP: 142/95 mmHg (02/27 1930) Pulse Rate: 69  (02/27 1930)  Labs:  Sean Figueroa 01/15/12 2116 01/15/12 0542 01/14/12 2340 01/14/12 1809 01/14/12 1105  HGB -- 15.1 -- -- 16.7  HCT -- 43.2 -- -- 46.3  PLT -- 207 -- -- 221  APTT -- -- -- 44* --  LABPROT 18.8* 23.6* -- 24.0* --  INR 1.54* 2.06* -- 2.11* --  HEPARINUNFRC -- -- -- -- --  CREATININE -- 1.07 -- -- 1.03  CKTOTAL -- 66 84 41 --  CKMB -- 3.6 4.0 3.6 --  TROPONINI -- <0.30 <0.30 <0.30 --   Estimated Creatinine Clearance: 60.4 ml/min (by C-G formula based on Cr of 1.07).  Medical History: Past Medical History  Diagnosis Date  . 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 Has chronic angina  . COPD (chronic obstructive pulmonary disease)     Uses 2.5L O2 nightly  . Hyperlipidemia   . Hypertension   . Long-term (current) use of anticoagulants     For hx of LV apical aneurysm  . LVH (left ventricular hypertrophy)   . Atrial fibrillation     Unclear history    Medications:  Scheduled:     . aspirin  324 mg Oral Pre-Cath  . aspirin EC  81 mg Oral Daily  . calcium carbonate  1 tablet Oral Daily  . diazepam  5 mg Oral On Call  . furosemide  60 mg Oral Daily  . heparin  3,000 Units Intravenous Once  . hydrochlorothiazide  25 mg Oral Daily  . irbesartan  150 mg Oral Daily  .  metoprolol  50 mg Oral BID  . omega-3 acid ethyl esters  2 g Oral Daily  . potassium chloride SA  20 mEq Oral Daily  . ranolazine  500 mg Oral BID  . rosuvastatin  10 mg Oral q1800  . sodium bicarbonate  650 mg Oral Daily  . sodium chloride  3 mL Intravenous Q12H  . sodium chloride  3 mL Intravenous Q12H  . theophylline  300 mg Oral Daily  . tiotropium  1 capsule Inhalation Daily    Assessment: 38 YOM with known history of CAD, also on coumadin PTA for h/o LV apical aneurysm, who presented with chest pain caused by unstable angina. Cardiac enzyme negative x 1, Will likely need cath, coumadin is on hold for now. Pharmacy is consulted to start IV heparin when INR < 2. INR = 1.54 tonight.    Goal of Therapy:  Heparin level = 0.3-0.7   Plan:  Heparin bolus 3000 units x1 Heparin drip at 1250 units/hr Check AM heparin level  Sean Figueroa 01/15/2012,10:20 PM

## 2012-01-15 NOTE — Progress Notes (Signed)
UR Completed. Simmons, Jessie Schrieber F [phone]  

## 2012-01-16 ENCOUNTER — Encounter: Payer: Self-pay | Admitting: Internal Medicine

## 2012-01-16 ENCOUNTER — Inpatient Hospital Stay (HOSPITAL_COMMUNITY): Payer: Medicare Other

## 2012-01-16 ENCOUNTER — Encounter (HOSPITAL_COMMUNITY)
Admission: EM | Disposition: A | Discharge status: Home / Self Care | Payer: Self-pay | Source: Ambulatory Visit | Attending: Internal Medicine

## 2012-01-16 DIAGNOSIS — I2510 Atherosclerotic heart disease of native coronary artery without angina pectoris: Secondary | ICD-10-CM

## 2012-01-16 HISTORY — PX: LEFT HEART CATHETERIZATION WITH CORONARY ANGIOGRAM: SHX5451

## 2012-01-16 LAB — PULMONARY FUNCTION TEST

## 2012-01-16 LAB — BLOOD GAS, ARTERIAL
Acid-Base Excess: 4.8 mmol/L — ABNORMAL HIGH (ref 0.0–2.0)
Drawn by: 22251
O2 Content: 2 L/min
O2 Saturation: 93 %
Patient temperature: 98.6

## 2012-01-16 SURGERY — LEFT HEART CATHETERIZATION WITH CORONARY ANGIOGRAM
Anesthesia: LOCAL

## 2012-01-16 MED ORDER — ACETAMINOPHEN 325 MG PO TABS: 650.0000 mg | ORAL_TABLET | ORAL | Status: DC | PRN

## 2012-01-16 MED ORDER — MIDAZOLAM HCL 2 MG/2ML IJ SOLN
INTRAMUSCULAR | Status: AC
  Filled 2012-01-16: qty 2

## 2012-01-16 MED ORDER — FENTANYL CITRATE 0.05 MG/ML IJ SOLN
INTRAMUSCULAR | Status: AC
  Filled 2012-01-16: qty 2

## 2012-01-16 MED ORDER — HEPARIN (PORCINE) IN NACL 100-0.45 UNIT/ML-% IJ SOLN
1000.0000 [IU]/h | INTRAMUSCULAR | Status: DC
  Administered 2012-01-16: 1100 [IU]/h via INTRAVENOUS
  Administered 2012-01-17: 17:00:00 1150 [IU]/h via INTRAVENOUS
  Administered 2012-01-19: 12:00:00 1100 [IU]/h via INTRAVENOUS
  Filled 2012-01-16 (×5): qty 250

## 2012-01-16 MED ORDER — SODIUM CHLORIDE 0.9 % IJ SOLN
3.0000 mL | Freq: Two times a day (BID) | INTRAMUSCULAR | Status: DC
  Administered 2012-01-19: 22:00:00 3 mL via INTRAVENOUS

## 2012-01-16 MED ORDER — SODIUM CHLORIDE 0.9 % IV SOLN: 1.0000 mL/kg/h | INTRAVENOUS | Status: AC

## 2012-01-16 MED ORDER — ONDANSETRON HCL 4 MG/2ML IJ SOLN: 4.0000 mg | Freq: Four times a day (QID) | INTRAMUSCULAR | Status: DC | PRN

## 2012-01-16 MED ORDER — NITROGLYCERIN 0.2 MG/ML ON CALL CATH LAB
INTRAVENOUS | Status: AC
  Filled 2012-01-16: qty 1

## 2012-01-16 MED ORDER — SODIUM CHLORIDE 0.9 % IV SOLN: 250.0000 mL | INTRAVENOUS | Status: DC

## 2012-01-16 MED ORDER — LIDOCAINE HCL (PF) 1 % IJ SOLN
INTRAMUSCULAR | Status: AC
  Filled 2012-01-16: qty 30

## 2012-01-16 MED ORDER — SODIUM CHLORIDE 0.9 % IJ SOLN: 3.0000 mL | INTRAMUSCULAR | Status: DC | PRN

## 2012-01-16 MED ORDER — ASPIRIN 81 MG PO CHEW: 81.0000 mg | CHEWABLE_TABLET | Freq: Every day | ORAL | Status: DC

## 2012-01-16 MED ORDER — HEPARIN (PORCINE) IN NACL 2-0.9 UNIT/ML-% IJ SOLN
INTRAMUSCULAR | Status: AC
  Filled 2012-01-16: qty 2000

## 2012-01-16 MED ORDER — LEVALBUTEROL HCL 0.63 MG/3ML IN NEBU
0.6300 mg | INHALATION_SOLUTION | Freq: Once | RESPIRATORY_TRACT | Status: AC
  Administered 2012-01-16: 14:00:00 0.63 mg via RESPIRATORY_TRACT
  Filled 2012-01-16: qty 3

## 2012-01-16 NOTE — Interval H&P Note (Signed)
History and Physical Interval Note:  01/16/2012 7:39 AM  Sean Figueroa  has presented today for surgery, with the diagnosis of chest pain  The various methods of treatment have been discussed with the patient and family. After consideration of risks, benefits and other options for treatment, the patient has consented to  Procedure(s) (LRB): LEFT HEART CATHETERIZATION WITH CORONARY ANGIOGRAM (N/A) as a surgical intervention .  The patients' history has been reviewed, patient examined, no change in status, stable for surgery.  I have reviewed the patients' chart and labs.  Questions were answered to the patient's satisfaction.  Chart reviewed - I have no pertinent additions to make to the note of Dr Sean Figueroa from 2/27. Pt is stable. Plan proceed with cath and possible PCI today.   Tonny Bollman 01/16/2012 7:39 AM

## 2012-01-16 NOTE — CV Procedure (Signed)
   Cardiac Catheterization Procedure Note  Name: Sean Figueroa MRN: [id number] DOB: [birth year]  Procedure: Left Heart Cath, Selective Coronary Angiography, LV angiography  Indication: This is a 78 year old gentleman with known coronary artery disease. He has remote anterior wall myocardial infarction with apical aneurysm on long-term warfarin. He presented with crescendo angina and was referred for cardiac catheterization. He has anginal symptoms with minimal activity. He has not undergone cardiac catheterization in over 20 years.   Procedural Details: The right wrist was prepped, draped, and anesthetized with 1% lidocaine. Using the modified Seldinger technique, a 5 French sheath was introduced into the right radial artery. 3 mg of verapamil was administered through the sheath, weight-based unfractionated heparin was administered intravenously. Standard Judkins catheters were used for selective coronary angiography and left ventriculography. Catheter exchanges were performed over an exchange length guidewire. There were no immediate procedural complications. A TR band was used for radial hemostasis at the completion of the procedure.  The patient was transferred to the post catheterization recovery area for further monitoring.  Procedural Findings: Hemodynamics: AO 141/71 LV 144/15  Coronary angiography: Coronary dominance: right  Left mainstem: There is critical 90% stenosis of the distal left mainstem  Left anterior descending (LAD): The LAD has 80% ostial stenosis. The remaining portions of the vessel are patent and the vessel wraps around the left ventricular apex. There is a large septal cascade supplying collaterals to the right PDA. There is a diseased first diagonal branch moderate in vessel caliber  Left circumflex (LCx): The left circumflex is severely diseased. There is heavy calcification throughout the mid circumflex. There is diffuse 80-90% stenosis in the mid vessel leading  into a branching first obtuse marginal.  Right coronary artery (RCA): The right coronary artery is heavily calcified. The vessel is severely diseased with critical segmental stenoses in the proximal and mid vessel leading into total occlusion at the junction of the mid and distal RCA. The distal branch vessels of the RCA fill from left to right collaterals.  Left ventriculography: There is severe segmental left ventricular systolic dysfunction present. The distal anterolateral, apical, and inferolateral portions of the left ventricle are all akinetic. The basal segments of the LV contract normally. The estimated left ventricular ejection fraction is 35%.  Final Conclusions:   1. Severe left main stenosis 2. Severe proximal LAD stenosis  3. Severe left circumflex stenosis 4. Total occlusion of the right coronary artery with left to right collaterals 5. Severe segmental left ventricular systolic dysfunction  Recommendations: This is a difficult situation in a patient who clearly has surgical coronary anatomy. However, I suspect his risk of cardiac surgery is extremely high because of his severe COPD and continued cigarette use on home oxygen. I will discuss options with the patient and consider surgical consultation if the patient is agreeable. His coronary anatomy, while high risk, is amenable to PCI if necessary.  Tonny Bollman 01/16/2012, 8:22 AM

## 2012-01-16 NOTE — Consult Note (Signed)
[address]            [place] [address]          [phone]       Sean Figueroa [facility] Medical Record #[id number] Date of Birth: 1933-03-14  Referring: No ref. provider found Primary Care: Ralene Ok, MD, MD  Chief Complaint:    Chief Complaint  Patient presents with  . Chest Pain  . Numbness    History of Present Illness:     I was asked to see this 78 year old Caucasian male smoker with end-stage COPD for possible multi vessel coronary bypass grafting after being recently diagnosed with severe left main and three-vessel CAD. The patient has a history of CAD and an old remote apical MI with EF of 30-40%. He has had unstable angina for the past several days taking up to 5 nitroglycerin every night. He was admitted and ruled out for MI and underwent cardiac catheterization. This demonstrates chronic occlusion of the RCA, 90% stenosis of the left main with fairly patent LAD and obtuse marginal vessels. EF is 40% LVEDP is 8 mm mercury and there is no AI or aortic stenosis or MR.  The patient has severe COPD on home oxygen. Radiographic studies including chest x-ray and CT scan demonstrate severe emphysema with scarring of the lingula and left lower lobe. Pulmonary function tests performed today show severe COPD with FEV1 of 800 cc, FVC of 1.2 and diffusion capacity of only 30% predicted indicating severe emphysema and destruction of the lung parenchyma.  Current Activity/ Functional Status: Functional status is poor. He did short of breath with activity of daily living including going to the bank and getting a haircut    Past Medical History  Diagnosis Date  . 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 Has chronic angina  . COPD (chronic obstructive pulmonary disease)     Uses 2.5L O2 nightly  . Hyperlipidemia   . Hypertension   .  Long-term (current) use of anticoagulants     For hx of LV apical aneurysm  . LVH (left ventricular hypertrophy)   . Atrial fibrillation     Unclear history    Past Surgical History  Procedure Date  . Coronary angioplasty   . Cholecystectomy   . Hernia repair     History  Smoking status  . Current Some Day Smoker -- 0.5 packs/day  . Types: Cigarettes  Smokeless tobacco  . Not on file    History  Alcohol Use No    History   Social History  . Marital Status: Widowed    Spouse Name: N/A    Number of Children: N/A  . Years of Education: N/A   Occupational History  . Not on file.   Social History Main Topics  . Smoking status: Current Some Day Smoker -- 0.5 packs/day    Types: Cigarettes  . Smokeless tobacco: Not on file  . Alcohol Use: No  . Drug Use: No  . Sexually Active: Not Currently   Other Topics Concern  . Not on file   Social History Narrative  . No narrative on file    Allergies  Allergen Reactions  . Indocin     unknown  . Lipitor (Atorvastatin Calcium)     Leg  pain  . Pravachol     Leg pain  . Procardia (Nifedipine)   . Zaroxolyn (Metolazone)     Current Facility-Administered Medications  Medication Dose Route Frequency Provider Last Rate Last Dose  . 0.9 %  sodium chloride infusion  250 mL Intravenous PRN Laurann Montana, PA   250 mL at 01/14/12 1948  . 0.9 %  sodium chloride infusion  1 mL/kg/hr Intravenous Continuous Micheline Chapman, MD 76.2 mL/hr at 01/16/12 1530 1 mL/kg/hr at 01/16/12 1530  . 0.9 %  sodium chloride infusion  250 mL Intravenous Continuous Micheline Chapman, MD 1 mL/hr at 01/16/12 1613 250 mL at 01/16/12 1613  . acetaminophen (TYLENOL) tablet 650 mg  650 mg Oral Q4H PRN Laurann Montana, PA      . ALPRAZolam Prudy Feeler) tablet 0.25 mg  0.25 mg Oral Q6H PRN Laurann Montana, PA   0.25 mg at 01/15/12 2209  . aspirin chewable tablet 324 mg  324 mg Oral Pre-Cath Cassell Clement, MD   324 mg at 01/16/12 0600  . aspirin EC tablet 81 mg  81  mg Oral Daily Dolores Patty, MD   81 mg at 01/16/12 1237  . calcium carbonate (OS-CAL - dosed in mg of elemental calcium) tablet 500 mg of elemental calcium  1 tablet Oral Daily Dayna N Dunn, PA   500 mg of elemental calcium at 01/15/12 1053  . diazepam (VALIUM) tablet 5 mg  5 mg Oral On Call Cassell Clement, MD   5 mg at 01/16/12 [phone]  . fentaNYL (SUBLIMAZE) 0.05 MG/ML injection           . furosemide (LASIX) tablet 60 mg  60 mg Oral Daily Dayna N Dunn, PA   60 mg at 01/15/12 1053  . guaiFENesin (MUCINEX) 12 hr tablet 1,200 mg  1,200 mg Oral BID PRN Dayna N Dunn, PA      . heparin 2-0.9 UNIT/ML-% infusion           . heparin ADULT infusion 100 units/mL (25000 units/250 mL)  1,100 Units/hr Intravenous Continuous Rolley Sims, [place] 11 mL/hr at 01/16/12 1611 1,100 Units/hr at 01/16/12 1611  . heparin bolus via infusion 3,000 Units  3,000 Units Intravenous Once Dolores Patty, MD   3,000 Units at 01/15/12 2255  . hydrochlorothiazide (HYDRODIURIL) tablet 25 mg  25 mg Oral Daily Dolores Patty, MD   25 mg at 01/15/12 1053  . HYDROcodone-acetaminophen (NORCO) 5-325 MG per tablet 1 tablet  1 tablet Oral Q6H PRN Dayna N Dunn, PA      . irbesartan (AVAPRO) tablet 150 mg  150 mg Oral Daily Dolores Patty, MD   150 mg at 01/15/12 1053  . levalbuterol (XOPENEX HFA) inhaler 1-2 puff  1-2 puff Inhalation Q4H PRN Dayna N Dunn, PA      . levalbuterol (XOPENEX) nebulizer solution 0.63 mg  0.63 mg Nebulization Once Dolores Patty, MD   0.63 mg at 01/16/12 1346  . lidocaine (XYLOCAINE) 1 % injection           . meclizine (ANTIVERT) tablet 25 mg  25 mg Oral TID PRN Dayna N Dunn, PA      . metoprolol (LOPRESSOR) tablet 50 mg  50 mg Oral BID Laurann Montana, PA   50 mg at 01/15/12 2204  . midazolam (VERSED) 2 MG/2ML injection           . nitroGLYCERIN (NITROSTAT) SL tablet 0.4 mg  0.4 mg Sublingual Q5  Min x 3 PRN Dayna N Dunn, PA      . nitroGLYCERIN (NTG ON-CALL) 0.2 mg/mL injection            . nitroGLYCERIN 0.2 mg/mL in dextrose 5 % infusion  5-50 mcg/min Intravenous Titrated Dayna N Dunn, PA   5 mcg/min at 01/15/12 0600  . omega-3 acid ethyl esters (LOVAZA) capsule 2 g  2 g Oral Daily Dolores Patty, MD   2 g at 01/15/12 1054  . ondansetron (ZOFRAN) injection 4 mg  4 mg Intravenous Q6H PRN Dayna N Dunn, PA      . potassium chloride SA (K-DUR,KLOR-CON) CR tablet 20 mEq  20 mEq Oral Daily Dayna N Dunn, PA   20 mEq at 01/15/12 1055  . ranolazine (RANEXA) 12 hr tablet 500 mg  500 mg Oral BID Laurann Montana, PA   500 mg at 01/15/12 2204  . rosuvastatin (CRESTOR) tablet 10 mg  10 mg Oral q1800 Dolores Patty, MD   10 mg at 01/16/12 1720  . sodium bicarbonate tablet 650 mg  650 mg Oral Daily Dayna N Dunn, PA   650 mg at 01/15/12 1053  . sodium chloride 0.9 % injection 3 mL  3 mL Intravenous Q12H Dayna N Dunn, PA   3 mL at 01/15/12 2206  . sodium chloride 0.9 % injection 3 mL  3 mL Intravenous PRN Dayna N Dunn, PA      . sodium chloride 0.9 % injection 3 mL  3 mL Intravenous Q12H Micheline Chapman, MD      . sodium chloride 0.9 % injection 3 mL  3 mL Intravenous PRN Micheline Chapman, MD      . theophylline (THEODUR) 12 hr tablet 300 mg  300 mg Oral Daily Dolores Patty, MD   300 mg at 01/15/12 1053  . tiotropium (SPIRIVA) inhalation capsule 18 mcg  1 capsule Inhalation Daily Dayna N Dunn, PA   18 mcg at 01/16/12 1141  . DISCONTD: 0.9 %  sodium chloride infusion  250 mL Intravenous PRN Cassell Clement, MD      . DISCONTD: 0.9 %  sodium chloride infusion  1 mL/kg/hr Intravenous Continuous Cassell Clement, MD 82.6 mL/hr at 01/16/12 1100 1 mL/kg/hr at 01/16/12 1100  . DISCONTD: acetaminophen (TYLENOL) tablet 650 mg  650 mg Oral Q4H PRN Micheline Chapman, MD      . DISCONTD: aspirin chewable tablet 81 mg  81 mg Oral Daily Micheline Chapman, MD      . DISCONTD: heparin ADULT infusion 100 units/mL (25000 units/250 mL)  1,250 Units/hr Intravenous Continuous Dolores Patty, MD 12.5  mL/hr at 01/16/12 1100 1,250 Units/hr at 01/16/12 1100  . DISCONTD: ondansetron (ZOFRAN) injection 4 mg  4 mg Intravenous Q6H PRN Micheline Chapman, MD      . DISCONTD: sodium chloride 0.9 % injection 3 mL  3 mL Intravenous Q12H Cassell Clement, MD   3 mL at 01/15/12 2204  . DISCONTD: sodium chloride 0.9 % injection 3 mL  3 mL Intravenous PRN Cassell Clement, MD        Prescriptions prior to admission  Medication Sig Dispense Refill  . albuterol (PROAIR HFA) 108 (90 BASE) MCG/ACT inhaler Inhale 2 puffs into the lungs every 6 (six) hours as needed for wheezing.  1 Inhaler  5  . ALPRAZolam (XANAX) 0.25 MG tablet Take 1 tablet (0.25 mg total) by mouth every 6 (six) hours as needed for anxiety.  30 tablet  5  .  aspirin 81 MG tablet Take 81 mg by mouth daily.        . calcium carbonate (OS-CAL - DOSED IN MG OF ELEMENTAL CALCIUM) 1250 MG tablet Take 1 tablet by mouth daily.      . calcium carbonate 200 MG capsule Take 500 mg by mouth daily.        . fish oil-omega-3 fatty acids 1000 MG capsule Take 2 g by mouth daily.        . furosemide (LASIX) 40 MG tablet Take 60 mg by mouth daily.       Marland Kitchen guaiFENesin (MUCINEX) 600 MG 12 hr tablet Take 1,200 mg by mouth 2 (two) times daily as needed.        Marland Kitchen HYDROcodone-acetaminophen (VICODIN) 5-500 MG per tablet Take 1 tablet by mouth every 6 (six) hours as needed.        . isosorbide mononitrate (IMDUR) 60 MG 24 hr tablet Take 1 tablet (60 mg total) by mouth 2 (two) times daily.  60 tablet  11  . levalbuterol (XOPENEX HFA) 45 MCG/ACT inhaler Inhale 1-2 puffs into the lungs every 4 (four) hours as needed. For shortness of breath      . meclizine (ANTIVERT) 25 MG tablet Take 25 mg by mouth 3 (three) times daily as needed. For dizziness      . metoprolol (LOPRESSOR) 50 MG tablet Take 50 mg by mouth 2 (two) times daily.      . potassium chloride SA (K-DUR,KLOR-CON) 20 MEQ tablet Take 20 mEq by mouth daily.        . ranolazine (RANEXA) 500 MG 12 hr tablet Take 1  tablet (500 mg total) by mouth 2 (two) times daily.  60 tablet  3  . rosuvastatin (CRESTOR) 10 MG tablet Take 10 mg by mouth daily.      . sodium bicarbonate 650 MG tablet Take 650 mg by mouth daily.       Marland Kitchen SPIRIVA HANDIHALER 18 MCG inhalation capsule Place 1 capsule into inhaler and inhale Daily.      . temazepam (RESTORIL) 30 MG capsule Take 30 mg by mouth at bedtime as needed. For sleep      . theophylline (THEODUR) 300 MG 12 hr tablet Take 1 tablet (300 mg total) by mouth daily.  30 tablet  11  . valsartan-hydrochlorothiazide (DIOVAN-HCT) 160-25 MG per tablet Take 1 tablet by mouth daily.       Marland Kitchen warfarin (COUMADIN) 5 MG tablet Take 2.5-5 mg by mouth daily at 6 PM. Take 1 tablet on Mon Tue, Wed,Fri,Sat . Take 0.5 tablet on Sun, Thur.      . nitroGLYCERIN (NITROSTAT) 0.4 MG SL tablet Place 0.4 mg under the tongue every 5 (five) minutes as needed.          Family History  Problem Relation Age of Onset  . Heart disease Mother   . Cerebral palsy Daughter      Review of Systems:     Cardiac Review of Systems: Y or N  Chest Pain [ Y   ]  Resting SOB [  Y ] Exertional SOB  [Y  ]  Orthopnea [  ]   Pedal Edema Klaus.Mock   ]    Palpitations [  ] Syncope  [  ]   Presyncope [   ]  General Review of Systems: [Y] = yes [  ]=no Constitional: recent weight change [ N ]; anorexia [  ]; fatigue [  ]; nausea [  ];  night sweats [  ]; fever Klaus.Mock  ]; or chills [  ];                                                                                                                                          Dental: poor dentition[  ]; Last Dentist visit:1-2 YRS  Eye : blurred vision [  ]; diplopia [   ]; vision changes [  ];  Amaurosis fugax[  ]; Resp: cough [  ];  wheezing[  ];  hemoptysis[N  ]; shortness of breath[  ]; paroxysmal nocturnal dyspnea[  ]; dyspnea on exertion[  ]; or orthopnea[  ];  GI:  gallstones[  ], vomiting[  ];  dysphagia[  ]; melena[  ];  hematochezia [  ]; heartburn[  ];   Hx of  Colonoscopy[   ]; GU: kidney stones [  ]; hematuria[  ];   dysuria [  ];  nocturia[  ];  history of     obstruction [  ];             Skin: rash, swelling[  ];, hair loss[  ];  peripheral edema[  ];  or itching[  ]; Musculosketetal: myalgias[  ];  joint swelling[  ];  joint erythema[  ];  joint pain[  ];  back pain[  ];  Heme/Lymph: bruising[  ];  bleeding[  ];  anemia[  ];  Neuro: TIA[  ];  headaches[  ];  stroke[  ];  vertigo[  ];  seizures[  ];   paresthesias[  ];  difficulty walking[  ];  Psych:depression[  ]; anxiety[  ];  Endocrine: diabetes[  ];  thyroid dysfunction[  ];  Immunizations: Flu [  ]; Pneumococcal[  ];  Other:  Physical Exam: BP 139/72  Pulse 66  Temp(Src) 97.2 F (36.2 C) (Oral)  Resp 26  Ht 5\' 7"  (1.702 m)  Wt 167 lb 15.9 oz (76.2 kg)  BMI 26.31 kg/m2  SpO2 97% General appearance chronically ill white male with COPD short of breath with speech HEENT normocephalic poor dentition Neck no JVD mass or crepitus no carotid bruit   chest barrel chested very distant breath sounds with scattered rhonchi Cardiac regular rhythm without murmur or gallop Abdomen soft nontender without pulsatile mass Extremities positive clubbing negative tenderness edema positive varicosities of the lower extremities Neuro alert appropriate no focal motor deficit    Diagnostic Studies & Laboratory data:   Cardiac cath, 2-D echo, chest x-ray reviewed. PFTs reviewed with ABGs  Recent Radiology Findings:   No results found.severe COPD     Recent Lab Findings: Lab Results  Component Value Date   WBC 9.0 01/15/2012   HGB 15.1 01/15/2012   HCT 43.2 01/15/2012   PLT 207 01/15/2012   GLUCOSE 100* 01/15/2012   CHOL 111 01/15/2012   TRIG 98 01/15/2012   HDL 42 01/15/2012  LDLCALC 49 01/15/2012   ALT 11 02/27/2011   AST 17 02/27/2011   NA 141 01/15/2012   K 3.4* 01/15/2012   CL 104 01/15/2012   CREATININE 1.07 01/15/2012   BUN 14 01/15/2012   CO2 28 01/15/2012   INR 1.54* 01/15/2012   HGBA1C 5.8* 01/14/2012       Assessment / Plan:      Unstable angina with significant left main stenosis and moderate LV dysfunction. He has severe COPD and would not tolerate or survive sternotomy for CABG. Would recommend PCI of his left main. Situation discussed with patient he understands he is not a surgical candidate.      @me1 @ 01/16/2012 6:41 PM

## 2012-01-16 NOTE — H&P (View-Only) (Signed)
 Subjective:  The patient did not have any chest pain overnight.  Remains on IV NTG.  Rhythm is NSR.  Objective:  Vital Signs in the last 24 hours: Temp:  [97.4 F (36.3 C)-98.9 F (37.2 C)] 97.9 F (36.6 C) (02/27 0430) Pulse Rate:  [56-80] 66  (02/27 0430) Resp:  [12-25] 20  (02/27 0430) BP: (127-158)/(56-98) 130/81 mmHg (02/27 0430) SpO2:  [92 %-98 %] 92 % (02/27 0430) Weight:  [182 lb (82.555 kg)] 182 lb (82.555 kg) (02/26 1031)  Intake/Output from previous day: 02/26 0701 - 02/27 0700 In: 13.9 [I.V.:13.9] Out: 850 [Urine:850] Intake/Output from this shift:       . aspirin  324 mg Oral Once  . aspirin EC  81 mg Oral Daily  . calcium carbonate  1 tablet Oral Daily  . furosemide  60 mg Oral Daily  . hydrochlorothiazide  25 mg Oral Daily  . irbesartan  150 mg Oral Daily  . metoprolol  50 mg Oral BID  . omega-3 acid ethyl esters  2 g Oral Daily  . potassium chloride SA  20 mEq Oral Daily  . ranolazine  500 mg Oral BID  . rosuvastatin  10 mg Oral q1800  . sodium bicarbonate  650 mg Oral Daily  . sodium chloride  3 mL Intravenous Q12H  . theophylline  300 mg Oral Daily  . tiotropium  1 capsule Inhalation Daily  . DISCONTD: aspirin  81 mg Oral Daily  . DISCONTD: fish oil-omega-3 fatty acids  2 g Oral Daily  . DISCONTD: NON FORMULARY 10 mg  10 mg Oral Daily  . DISCONTD: theophylline  300 mg Oral Daily  . DISCONTD: valsartan-hydrochlorothiazide  1 tablet Oral Daily      . nitroGLYCERIN 5 mcg/min (01/15/12 0600)    Physical Exam: The patient appears to be in no distress.  Head and neck exam reveals that the pupils are equal and reactive.  The extraocular movements are full.  There is no scleral icterus.  Mouth and pharynx are benign.  No lymphadenopathy.  No carotid bruits.  The jugular venous pressure is normal.  Thyroid is not enlarged or tender.  Chest reveals diffuse coarse rhonchi and wheezing.  Heart reveals no abnormal lift or heave.  First and second  heart sounds are normal.  There is no murmur gallop rub or click.  The abdomen is soft and nontender.  Bowel sounds are normoactive.  There is no hepatosplenomegaly or mass.  There are no abdominal bruits.  Extremities reveal no phlebitis or edema.  Pedal pulses are 1 plus PT pulse bilat.  There is no cyanosis or clubbing.  Neurologic exam is normal strength and no lateralizing weakness.  No sensory deficits.  Integument reveals no rash  Lab Results:  Basename 01/15/12 0542 01/14/12 1105  WBC 9.0 7.8  HGB 15.1 16.7  PLT 207 221    Basename 01/15/12 0542 01/14/12 1105  NA 141 138  K 3.4* 3.7  CL 104 99  CO2 28 30  GLUCOSE 100* 120*  BUN 14 12  CREATININE 1.07 1.03    Basename 01/15/12 0542 01/14/12 2340  TROPONINI <0.30 <0.30   Hepatic Function Panel No results found for this basename: PROT,ALBUMIN,AST,ALT,ALKPHOS,BILITOT,BILIDIR,IBILI in the last 72 hours  Basename 01/15/12 0542  CHOL 111   No results found for this basename: PROTIME in the last 72 hours  Imaging: Imaging results have been reviewed  Cardiac Studies: 2D echo pending. Assessment/Plan:  Patient Active Hospital Problem List: Intermediate coronary syndrome (  01/14/2012)   Assessment: Pain presently controlled on IV NTG   Plan: Cardiac cath tomorrow anticipating INR will be down to 1.5 range by then. COPD   Assessment:  No respiratory distress but loud rhonchi and wheeze   Plan: Add Mucinex.  Smoking cessation. Hypokalemia   Assessment:  K 3.4   Plan:  Replete K   LOS: 1 day    Oryan Winterton 01/15/2012, 8:00 AM    

## 2012-01-16 NOTE — Progress Notes (Signed)
ANTICOAGULATION CONSULT NOTE - Follow Up Consult  Pharmacy Consult for Heparin Indication: Severe CAD awaiting CABG vs. PCI plans and hx LV apical aneurysm  Allergies  Allergen Reactions  . Indocin     unknown  . Lipitor (Atorvastatin Calcium)     Leg pain  . Pravachol     Leg pain  . Procardia (Nifedipine)   . Zaroxolyn (Metolazone)     Patient Measurements: Height: 5\' 7"  (170.2 cm) Weight: 167 lb 15.9 oz (76.2 kg) IBW/kg (Calculated) : 66.1  Heparin Dosing Weight: 76.2 kg  Vital Signs: Temp: 97.8 F (36.6 C) (02/28 1134) Temp src: Oral (02/28 1134) BP: 133/85 mmHg (02/28 1134) Pulse Rate: 66  (02/28 1134)  Labs:  Basename 01/15/12 2116 01/15/12 0542 01/14/12 2340 01/14/12 1809 01/14/12 1105  HGB -- 15.1 -- -- 16.7  HCT -- 43.2 -- -- 46.3  PLT -- 207 -- -- 221  APTT -- -- -- 44* --  LABPROT 18.8* 23.6* -- 24.0* --  INR 1.54* 2.06* -- 2.11* --  HEPARINUNFRC -- -- -- -- --  CREATININE -- 1.07 -- -- 1.03  CKTOTAL -- 66 84 41 --  CKMB -- 3.6 4.0 3.6 --  TROPONINI -- <0.30 <0.30 <0.30 --   Estimated Creatinine Clearance: 54.9 ml/min (by C-G formula based on Cr of 1.07).   Medications:  Prescriptions prior to admission  Medication Sig Dispense Refill  . albuterol (PROAIR HFA) 108 (90 BASE) MCG/ACT inhaler Inhale 2 puffs into the lungs every 6 (six) hours as needed for wheezing.  1 Inhaler  5  . ALPRAZolam (XANAX) 0.25 MG tablet Take 1 tablet (0.25 mg total) by mouth every 6 (six) hours as needed for anxiety.  30 tablet  5  . aspirin 81 MG tablet Take 81 mg by mouth daily.        . calcium carbonate (OS-CAL - DOSED IN MG OF ELEMENTAL CALCIUM) 1250 MG tablet Take 1 tablet by mouth daily.      . calcium carbonate 200 MG capsule Take 500 mg by mouth daily.        . fish oil-omega-3 fatty acids 1000 MG capsule Take 2 g by mouth daily.        . furosemide (LASIX) 40 MG tablet Take 60 mg by mouth daily.       Marland Kitchen guaiFENesin (MUCINEX) 600 MG 12 hr tablet Take 1,200 mg by  mouth 2 (two) times daily as needed.        Marland Kitchen HYDROcodone-acetaminophen (VICODIN) 5-500 MG per tablet Take 1 tablet by mouth every 6 (six) hours as needed.        . isosorbide mononitrate (IMDUR) 60 MG 24 hr tablet Take 1 tablet (60 mg total) by mouth 2 (two) times daily.  60 tablet  11  . levalbuterol (XOPENEX HFA) 45 MCG/ACT inhaler Inhale 1-2 puffs into the lungs every 4 (four) hours as needed. For shortness of breath      . meclizine (ANTIVERT) 25 MG tablet Take 25 mg by mouth 3 (three) times daily as needed. For dizziness      . metoprolol (LOPRESSOR) 50 MG tablet Take 50 mg by mouth 2 (two) times daily.      . potassium chloride SA (K-DUR,KLOR-CON) 20 MEQ tablet Take 20 mEq by mouth daily.        . ranolazine (RANEXA) 500 MG 12 hr tablet Take 1 tablet (500 mg total) by mouth 2 (two) times daily.  60 tablet  3  . rosuvastatin (CRESTOR)  10 MG tablet Take 10 mg by mouth daily.      . sodium bicarbonate 650 MG tablet Take 650 mg by mouth daily.       Marland Kitchen SPIRIVA HANDIHALER 18 MCG inhalation capsule Place 1 capsule into inhaler and inhale Daily.      . temazepam (RESTORIL) 30 MG capsule Take 30 mg by mouth at bedtime as needed. For sleep      . theophylline (THEODUR) 300 MG 12 hr tablet Take 1 tablet (300 mg total) by mouth daily.  30 tablet  11  . valsartan-hydrochlorothiazide (DIOVAN-HCT) 160-25 MG per tablet Take 1 tablet by mouth daily.       Marland Kitchen warfarin (COUMADIN) 5 MG tablet Take 2.5-5 mg by mouth daily at 6 PM. Take 1 tablet on Mon Tue, Wed,Fri,Sat . Take 0.5 tablet on Sun, Thur.      . nitroGLYCERIN (NITROSTAT) 0.4 MG SL tablet Place 0.4 mg under the tongue every 5 (five) minutes as needed.         Scheduled:    . aspirin  324 mg Oral Pre-Cath  . aspirin EC  81 mg Oral Daily  . calcium carbonate  1 tablet Oral Daily  . diazepam  5 mg Oral On Call  . fentaNYL      . furosemide  60 mg Oral Daily  . heparin      . heparin  3,000 Units Intravenous Once  . hydrochlorothiazide  25 mg Oral  Daily  . irbesartan  150 mg Oral Daily  . levalbuterol  0.63 mg Nebulization Once  . lidocaine      . metoprolol  50 mg Oral BID  . midazolam      . nitroGLYCERIN      . omega-3 acid ethyl esters  2 g Oral Daily  . potassium chloride SA  20 mEq Oral Daily  . ranolazine  500 mg Oral BID  . rosuvastatin  10 mg Oral q1800  . sodium bicarbonate  650 mg Oral Daily  . sodium chloride  3 mL Intravenous Q12H  . sodium chloride  3 mL Intravenous Q12H  . theophylline  300 mg Oral Daily  . tiotropium  1 capsule Inhalation Daily  . DISCONTD: aspirin  81 mg Oral Daily  . DISCONTD: sodium chloride  3 mL Intravenous Q12H    Assessment: 78 y.o. M to resume heparin post cath 8 hours after sheath removal for severe CAD while awaiting CABG vs. PCI plans and history of LV apical aneurysm. Per cath report, sheath removed at ~0830 this a.m. Heparin dosing weight~76 kg. Will not bolus due to recent cath.  Goal of Therapy:  Heparin level 0.3-0.7 units/ml   Plan:  1. Initiate heparin drip at rate of 1100 units/hr at 1630 today 2. Daily heparin levels starting on 3/2 a.m. 3. Will continue to monitor for any signs/symptoms of bleeding and will follow up with heparin level in 8 hours   Georgina Pillion, PharmD, BCPS Clinical Pharmacist Pager: [phone] 01/16/2012 2:29 PM

## 2012-01-17 LAB — CBC
HCT: 44.4 % (ref 39.0–52.0)
MCH: 34.3 pg — ABNORMAL HIGH (ref 26.0–34.0)
MCV: 96.9 fL (ref 78.0–100.0)
Platelets: 183 10*3/uL (ref 150–400)
RBC: 4.58 MIL/uL (ref 4.22–5.81)
WBC: 10.5 10*3/uL (ref 4.0–10.5)

## 2012-01-17 MED ORDER — CLOPIDOGREL BISULFATE 75 MG PO TABS
300.0000 mg | ORAL_TABLET | Freq: Once | ORAL | Status: AC
  Administered 2012-01-17: 21:00:00 300 mg via ORAL
  Filled 2012-01-17: qty 4

## 2012-01-17 MED ORDER — SODIUM CHLORIDE 0.9 % IJ SOLN: 3.0000 mL | Freq: Two times a day (BID) | INTRAMUSCULAR | Status: DC

## 2012-01-17 MED ORDER — SODIUM CHLORIDE 0.9 % IJ SOLN: 3.0000 mL | INTRAMUSCULAR | Status: DC | PRN

## 2012-01-17 MED ORDER — SODIUM CHLORIDE 0.9 % IV SOLN: INTRAVENOUS | Status: DC

## 2012-01-17 MED ORDER — ASPIRIN 81 MG PO CHEW
324.0000 mg | CHEWABLE_TABLET | ORAL | Status: AC
  Administered 2012-01-20: 324 mg via ORAL
  Filled 2012-01-17 (×2): qty 4

## 2012-01-17 MED ORDER — SODIUM CHLORIDE 0.9 % IV SOLN: 250.0000 mL | INTRAVENOUS | Status: DC | PRN

## 2012-01-17 MED ORDER — CLOPIDOGREL BISULFATE 75 MG PO TABS
75.0000 mg | ORAL_TABLET | Freq: Every day | ORAL | Status: DC
  Administered 2012-01-18 – 2012-01-22 (×5): 75 mg via ORAL
  Filled 2012-01-17 (×6): qty 1

## 2012-01-17 MED ORDER — ASPIRIN EC 81 MG PO TBEC
81.0000 mg | DELAYED_RELEASE_TABLET | Freq: Every day | ORAL | Status: DC
  Administered 2012-01-21 – 2012-01-22 (×2): 81 mg via ORAL
  Filled 2012-01-17 (×2): qty 1

## 2012-01-17 MED FILL — Perflutren Lipid Microsphere IV Susp 6.52 MG/ML: INTRAVENOUS | Qty: 2 | Status: AC

## 2012-01-17 NOTE — Progress Notes (Signed)
ANTICOAGULATION CONSULT NOTE - Follow Up Consult  Pharmacy Consult for Heparin Indication: Severe CAD awaiting CABG vs. PCI plans and hx LV apical aneurysm  Allergies  Allergen Reactions  . Indocin     unknown  . Lipitor (Atorvastatin Calcium)     Leg pain  . Pravachol     Leg pain  . Procardia (Nifedipine)   . Zaroxolyn (Metolazone)     Patient Measurements: Height: 5\' 7"  (170.2 cm) Weight: 171 lb 4.8 oz (77.7 kg) IBW/kg (Calculated) : 66.1  Heparin Dosing Weight: 76.2 kg  Vital Signs: Temp: 98.3 F (36.8 C) (03/01 1200) Temp src: Oral (03/01 1200) BP: 124/64 mmHg (03/01 1200) Pulse Rate: 66  (03/01 1200)  Labs:  Basename 01/17/12 1102 01/17/12 0145 01/15/12 2116 01/15/12 0542 01/14/12 2340 01/14/12 1809  HGB 15.7 -- -- 15.1 -- --  HCT 44.4 -- -- 43.2 -- --  PLT 183 -- -- 207 -- --  APTT -- -- -- -- -- 44*  LABPROT -- -- 18.8* 23.6* -- 24.0*  INR -- -- 1.54* 2.06* -- 2.11*  HEPARINUNFRC 0.36 0.44 -- -- -- --  CREATININE -- -- -- 1.07 -- --  CKTOTAL -- -- -- 66 84 41  CKMB -- -- -- 3.6 4.0 3.6  TROPONINI -- -- -- <0.30 <0.30 <0.30   Estimated Creatinine Clearance: 54.9 ml/min (by C-G formula based on Cr of 1.07).   Medications:  Prescriptions prior to admission  Medication Sig Dispense Refill  . albuterol (PROAIR HFA) 108 (90 BASE) MCG/ACT inhaler Inhale 2 puffs into the lungs every 6 (six) hours as needed for wheezing.  1 Inhaler  5  . ALPRAZolam (XANAX) 0.25 MG tablet Take 1 tablet (0.25 mg total) by mouth every 6 (six) hours as needed for anxiety.  30 tablet  5  . aspirin 81 MG tablet Take 81 mg by mouth daily.        . calcium carbonate (OS-CAL - DOSED IN MG OF ELEMENTAL CALCIUM) 1250 MG tablet Take 1 tablet by mouth daily.      . calcium carbonate 200 MG capsule Take 500 mg by mouth daily.        . fish oil-omega-3 fatty acids 1000 MG capsule Take 2 g by mouth daily.        . furosemide (LASIX) 40 MG tablet Take 60 mg by mouth daily.       Marland Kitchen guaiFENesin  (MUCINEX) 600 MG 12 hr tablet Take 1,200 mg by mouth 2 (two) times daily as needed.        Marland Kitchen HYDROcodone-acetaminophen (VICODIN) 5-500 MG per tablet Take 1 tablet by mouth every 6 (six) hours as needed.        . isosorbide mononitrate (IMDUR) 60 MG 24 hr tablet Take 1 tablet (60 mg total) by mouth 2 (two) times daily.  60 tablet  11  . levalbuterol (XOPENEX HFA) 45 MCG/ACT inhaler Inhale 1-2 puffs into the lungs every 4 (four) hours as needed. For shortness of breath      . meclizine (ANTIVERT) 25 MG tablet Take 25 mg by mouth 3 (three) times daily as needed. For dizziness      . metoprolol (LOPRESSOR) 50 MG tablet Take 50 mg by mouth 2 (two) times daily.      . potassium chloride SA (K-DUR,KLOR-CON) 20 MEQ tablet Take 20 mEq by mouth daily.        . ranolazine (RANEXA) 500 MG 12 hr tablet Take 1 tablet (500 mg total) by mouth  2 (two) times daily.  60 tablet  3  . rosuvastatin (CRESTOR) 10 MG tablet Take 10 mg by mouth daily.      . sodium bicarbonate 650 MG tablet Take 650 mg by mouth daily.       Marland Kitchen SPIRIVA HANDIHALER 18 MCG inhalation capsule Place 1 capsule into inhaler and inhale Daily.      . temazepam (RESTORIL) 30 MG capsule Take 30 mg by mouth at bedtime as needed. For sleep      . theophylline (THEODUR) 300 MG 12 hr tablet Take 1 tablet (300 mg total) by mouth daily.  30 tablet  11  . valsartan-hydrochlorothiazide (DIOVAN-HCT) 160-25 MG per tablet Take 1 tablet by mouth daily.       Marland Kitchen warfarin (COUMADIN) 5 MG tablet Take 2.5-5 mg by mouth daily at 6 PM. Take 1 tablet on Mon Tue, Wed,Fri,Sat . Take 0.5 tablet on Sun, Thur.      . nitroGLYCERIN (NITROSTAT) 0.4 MG SL tablet Place 0.4 mg under the tongue every 5 (five) minutes as needed.         Scheduled:     . aspirin EC  81 mg Oral Daily  . calcium carbonate  1 tablet Oral Daily  . furosemide  60 mg Oral Daily  . hydrochlorothiazide  25 mg Oral Daily  . irbesartan  150 mg Oral Daily  . levalbuterol  0.63 mg Nebulization Once  .  metoprolol  50 mg Oral BID  . omega-3 acid ethyl esters  2 g Oral Daily  . potassium chloride SA  20 mEq Oral Daily  . ranolazine  500 mg Oral BID  . rosuvastatin  10 mg Oral q1800  . sodium bicarbonate  650 mg Oral Daily  . sodium chloride  3 mL Intravenous Q12H  . sodium chloride  3 mL Intravenous Q12H  . theophylline  300 mg Oral Daily  . tiotropium  1 capsule Inhalation Daily  . DISCONTD: aspirin  81 mg Oral Daily    Assessment: 78 y.o. M resumed on heparin post cath on 2/28 for severe CAD while PCI planned for Monday, 3/4 and anticoagulation while INR <2 for history of LV apical aneurysm. Heparin level is now therapeutic x 2 however trending down a little (HL 0.36, goal of 0.3-0.7). Hgb/HctPlt stable. No s/sx of bleeding noted.   Goal of Therapy:  Heparin level 0.3-0.7 units/ml   Plan:  1. Increase heparin drip rate slightly to 1150 units/hr 2. Will continue to monitor for any signs/symptoms of bleeding and will follow up with heparin level in the a.m.   Georgina Pillion, PharmD, BCPS Clinical Pharmacist Pager: [phone] 01/17/2012 1:16 PM

## 2012-01-17 NOTE — Progress Notes (Signed)
ANTICOAGULATION CONSULT NOTE - Follow Up Consult  Pharmacy Consult for heparin Indication: severe CAD  Labs:  Basename 01/17/12 0145 01/15/12 2116 01/15/12 0542 01/14/12 2340 01/14/12 1809 01/14/12 1105  HGB -- -- 15.1 -- -- 16.7  HCT -- -- 43.2 -- -- 46.3  PLT -- -- 207 -- -- 221  APTT -- -- -- -- 44* --  LABPROT -- 18.8* 23.6* -- 24.0* --  INR -- 1.54* 2.06* -- 2.11* --  HEPARINUNFRC 0.44 -- -- -- -- --  CREATININE -- -- 1.07 -- -- 1.03  CKTOTAL -- -- 66 84 41 --  CKMB -- -- 3.6 4.0 3.6 --  TROPONINI -- -- <0.30 <0.30 <0.30 --   Assessment/Plan: 78yo male therapeutic on heparin after resumed post-cath; CVTS determined pt is not surgical candidate, recommend PCI.  Will continue heparin gtt at current rate and confirm stable with additional level.  Colleen Can PharmD BCPS 01/17/2012,2:42 AM

## 2012-01-17 NOTE — Progress Notes (Signed)
Pt c/o chest pain. Given 1 SL NTG with relief. B/P 158/91 with morning medications being adminstrated . Provider contacted with orders given and carried out.  Will continue to monitor pt.

## 2012-01-17 NOTE — Progress Notes (Signed)
    Subjective:  No chest pain or dyspnea at rest.   Objective:  Vital Signs in the last 24 hours: Temp:  [97.2 F (36.2 C)-98.2 F (36.8 C)] 97.8 F (36.6 C) (03/01 0400) Pulse Rate:  [59-71] 70  (03/01 0400) Resp:  [18-33] 33  (03/01 0400) BP: (125-139)/(63-87) 133/65 mmHg (03/01 0400) SpO2:  [91 %-97 %] 91 % (03/01 0400) Weight:  [77.7 kg (171 lb 4.8 oz)] 77.7 kg (171 lb 4.8 oz) (03/01 0610)  Intake/Output from previous day: 02/28 0701 - 03/01 0700 In: 1678.2 [P.O.:120; I.V.:1558.2] Out: 750 [Urine:750]  Physical Exam: Pt is alert and oriented, NAD HEENT: normal Neck: JVP - normal Lungs: CTA bilaterally but poor air movement and prolonged exp phase CV: RRR without murmur or gallop, distant heart sounds Abd: soft, NT, Positive BS, no hepatomegaly Ext: no C/C/E, distal pulses intact and equal. Right radial site clear Skin: warm/dry no rash   Lab Results:  Basename 01/15/12 0542 01/14/12 1105  WBC 9.0 7.8  HGB 15.1 16.7  PLT 207 221    Basename 01/15/12 0542 01/14/12 1105  NA 141 138  K 3.4* 3.7  CL 104 99  CO2 28 30  GLUCOSE 100* 120*  BUN 14 12  CREATININE 1.07 1.03    Basename 01/15/12 0542 01/14/12 2340  TROPONINI <0.30 <0.30    Cardiac Studies: 2D Echo:  - Left ventricle: Distal septal and apical akinesis The cavity size was mildly dilated. There was mild concentric hypertrophy. Systolic function was mildly reduced. The estimated ejection fraction was in the range of 45% to 50%. - Atrial septum: There was increased thickness of the septum, consistent with lipomatous hypertrophy.   Tele: Sinus rhythm  Assessment/Plan:  1. Unstable angina - severe left main and 3 vessel CAD 2. Severe COPD with ongoing tobacco use 3. Hypertension 4. Hyperlipidemia  Appreciate Dr VanTrigt's consult. The patient is not a candidate for CABG because of severe lung disease. Medical therapy is clearly not an option in the setting of critical left main disease.  Recommend high-risk unprotected PCI of the left main. I explained the risks, indication, and alternatives to this approach with the patient and he understands. He also understands that this carries increased risk of major procedural complication compared to routine PCI. The patient agrees to proceed. Will plan on PCI Monday morning. Will load him with plavix today and check P2Y12 test Sunday morning. Otherwise continue heparin and NTG along with home cardiac meds over the weekend.  Tonny Bollman, M.D. 01/17/2012, 8:34 AM

## 2012-01-18 ENCOUNTER — Other Ambulatory Visit: Payer: Self-pay

## 2012-01-18 LAB — CBC
Platelets: 196 10*3/uL (ref 150–400)
RBC: 4.5 MIL/uL (ref 4.22–5.81)
WBC: 11.8 10*3/uL — ABNORMAL HIGH (ref 4.0–10.5)

## 2012-01-18 LAB — HEPARIN LEVEL (UNFRACTIONATED): Heparin Unfractionated: 0.64 IU/mL (ref 0.30–0.70)

## 2012-01-18 NOTE — Progress Notes (Signed)
Nurse paged Dr. Sander Radon to inform him of patient's chest pain and increase in nitro drip from 5 to 10 mcg/min. Informed Dr. Sander Radon that the patient stated that he no longer has chest pain. Dr. Sander Radon said fine and thank you. Harmon Pier

## 2012-01-18 NOTE — Progress Notes (Signed)
ANTICOAGULATION CONSULT NOTE - Follow Up Consult  Pharmacy Consult for Heparin Indication: Severe CAD awaiting PCI and hx LV apical aneurysm  Assessment:  78 y.o. male resumed on heparin post cath on 2/28 for severe CAD while PCI planned for Monday (3/4) and bridging while INR <2 for history of LV apical aneurysm.  Heparin level today remains therapeutic at 0.64 after rate increase yesterday. No bleeding issues reported.  PTA Warfarin dose: 5mg  daily, except 2.5mg  on Thu & Sun  Pharmacy System-Based Medication Review: Infectious Disease: Afebrile, WBC WNL, on no abx Cardiovascular: Hx CAD(MI)/DL/HTN/LVH/Afib and presented with CP + [place], and found to have severe CAD. Per cards not CABG candidate d/t severe lung disease so to undergo PCI on 3/4. C/o CP this a.m., relieved with IV NTG. BP/24h: 97-140/82, HR: 60-70. On ASA, lasix/kcl, HCTZ, irbesartan, lopressor, fish oil, ranexa, crestor. Loaded with Plavix & will undergo P2Y12 testing. Endocrinology: A1c 5.8, CBGs at goal. On no SSI Nephrology: No new BMET today. From 2/27: SCr 1.07, CrCl~60 ml/min. Pulmonary: Hx COPD. On spiriva, home theodur (no levels this admission, monitor for drug interactions) Hematology / Oncology: Hgb/Hct/Plt stable Best Practices: Hep IV for VTE px, home meds addressed  Goals of Therapy: Heparin level 0.3-0.7  Plan: 1. Continue heparin IV rate at 1150 units/hr 2. Check HL in AM with daily CBC 3. Continue to hold coumadin for PCI on Monday  Sean Figueroa M. Saul Fordyce, PharmD Pager: [phone]  01/18/2012 10:44 AM   Allergies  Allergen Reactions  . Indocin     unknown  . Lipitor (Atorvastatin Calcium)     Leg pain  . Pravachol     Leg pain  . Procardia (Nifedipine)   . Zaroxolyn (Metolazone)     Patient Measurements: Height: 5\' 7"  (170.2 cm) Weight: 167 lb 12.3 oz (76.1 kg) IBW/kg (Calculated) : 66.1  Heparin Dosing Weight: 76.2 kg  Vital Signs: Temp: 97.8 F (36.6 C) (03/02 0800) Temp src: Oral (03/02  0800) BP: 127/82 mmHg (03/02 0800) Pulse Rate: 66  (03/02 0800)  Labs:  Basename 01/18/12 0700 01/17/12 1102 01/17/12 0145 01/15/12 2116  HGB 15.6 15.7 -- --  HCT 43.4 44.4 -- --  PLT 196 183 -- --  APTT -- -- -- --  LABPROT -- -- -- 18.8*  INR -- -- -- 1.54*  HEPARINUNFRC 0.64 0.36 0.44 --  CREATININE -- -- -- --  CKTOTAL -- -- -- --  CKMB -- -- -- --  TROPONINI -- -- -- --   Estimated Creatinine Clearance: 54.9 ml/min (by C-G formula based on Cr of 1.07).   Medications:  Prescriptions prior to admission  Medication Sig Dispense Refill  . albuterol (PROAIR HFA) 108 (90 BASE) MCG/ACT inhaler Inhale 2 puffs into the lungs every 6 (six) hours as needed for wheezing.  1 Inhaler  5  . ALPRAZolam (XANAX) 0.25 MG tablet Take 1 tablet (0.25 mg total) by mouth every 6 (six) hours as needed for anxiety.  30 tablet  5  . aspirin 81 MG tablet Take 81 mg by mouth daily.        . calcium carbonate (OS-CAL - DOSED IN MG OF ELEMENTAL CALCIUM) 1250 MG tablet Take 1 tablet by mouth daily.      . calcium carbonate 200 MG capsule Take 500 mg by mouth daily.        . fish oil-omega-3 fatty acids 1000 MG capsule Take 2 g by mouth daily.        . furosemide (LASIX) 40  MG tablet Take 60 mg by mouth daily.       Marland Kitchen guaiFENesin (MUCINEX) 600 MG 12 hr tablet Take 1,200 mg by mouth 2 (two) times daily as needed.        Marland Kitchen HYDROcodone-acetaminophen (VICODIN) 5-500 MG per tablet Take 1 tablet by mouth every 6 (six) hours as needed.        . isosorbide mononitrate (IMDUR) 60 MG 24 hr tablet Take 1 tablet (60 mg total) by mouth 2 (two) times daily.  60 tablet  11  . levalbuterol (XOPENEX HFA) 45 MCG/ACT inhaler Inhale 1-2 puffs into the lungs every 4 (four) hours as needed. For shortness of breath      . meclizine (ANTIVERT) 25 MG tablet Take 25 mg by mouth 3 (three) times daily as needed. For dizziness      . metoprolol (LOPRESSOR) 50 MG tablet Take 50 mg by mouth 2 (two) times daily.      . potassium chloride  SA (K-DUR,KLOR-CON) 20 MEQ tablet Take 20 mEq by mouth daily.        . ranolazine (RANEXA) 500 MG 12 hr tablet Take 1 tablet (500 mg total) by mouth 2 (two) times daily.  60 tablet  3  . rosuvastatin (CRESTOR) 10 MG tablet Take 10 mg by mouth daily.      . sodium bicarbonate 650 MG tablet Take 650 mg by mouth daily.       Marland Kitchen SPIRIVA HANDIHALER 18 MCG inhalation capsule Place 1 capsule into inhaler and inhale Daily.      . temazepam (RESTORIL) 30 MG capsule Take 30 mg by mouth at bedtime as needed. For sleep      . theophylline (THEODUR) 300 MG 12 hr tablet Take 1 tablet (300 mg total) by mouth daily.  30 tablet  11  . valsartan-hydrochlorothiazide (DIOVAN-HCT) 160-25 MG per tablet Take 1 tablet by mouth daily.       Marland Kitchen warfarin (COUMADIN) 5 MG tablet Take 2.5-5 mg by mouth daily at 6 PM. Take 1 tablet on Mon Tue, Wed,Fri,Sat . Take 0.5 tablet on Sun, Thur.      . nitroGLYCERIN (NITROSTAT) 0.4 MG SL tablet Place 0.4 mg under the tongue every 5 (five) minutes as needed.         Scheduled:     . aspirin  324 mg Oral Pre-Cath  . aspirin EC  81 mg Oral Daily  . aspirin EC  81 mg Oral Daily  . calcium carbonate  1 tablet Oral Daily  . clopidogrel  300 mg Oral Once  . clopidogrel  75 mg Oral Q breakfast  . furosemide  60 mg Oral Daily  . hydrochlorothiazide  25 mg Oral Daily  . irbesartan  150 mg Oral Daily  . metoprolol  50 mg Oral BID  . omega-3 acid ethyl esters  2 g Oral Daily  . potassium chloride SA  20 mEq Oral Daily  . ranolazine  500 mg Oral BID  . rosuvastatin  10 mg Oral q1800  . sodium bicarbonate  650 mg Oral Daily  . sodium chloride  3 mL Intravenous Q12H  . sodium chloride  3 mL Intravenous Q12H  . sodium chloride  3 mL Intravenous Q12H  . theophylline  300 mg Oral Daily  . tiotropium  1 capsule Inhalation Daily

## 2012-01-18 NOTE — Progress Notes (Signed)
Patient complained to the nurse that he felt pressure and pain in his chest, and requested a nitro sublinq. Nurse asked patient to qualify his pain level. Patient stated that it was a 8. Nurse checked patient's BP: 140/81, HR:70, and increased his nitro drip from 5 mcg/min to 10 mcg/min. Nurse tech performed an ECG. Patient articulated to the nurse that after increase of Nitro drip pain subsided to a 4. Harmon Pier

## 2012-01-18 NOTE — Progress Notes (Signed)
    Subjective:  Pt had an episode of chest pain this morning, resolved with an increase in IV NTG. Dyspnea unchanged. No chest pain at present.  Objective:  Vital Signs in the last 24 hours: Temp:  [97.2 F (36.2 C)-98.3 F (36.8 C)] 98.1 F (36.7 C) (03/02 0343) Pulse Rate:  [64-74] 70  (03/02 0626) Resp:  [20-26] 22  (03/02 0626) BP: (97-140)/(42-81) 140/81 mmHg (03/02 0626) SpO2:  [91 %-97 %] 97 % (03/02 0343) Weight:  [76.1 kg (167 lb 12.3 oz)] 76.1 kg (167 lb 12.3 oz) (03/02 0626)  Intake/Output from previous day: 03/01 0701 - 03/02 0700 In: 723 [P.O.:720; I.V.:3] Out: 2001 [Urine:2000; Stool:1]  Physical Exam: Pt is alert and oriented, chronically ill-appearing male in NAD HEENT: normal Neck: JVP - normal Lungs: prolonged expiration CV: RRR without murmur or gallop, distant heart sounds Abd: soft, NT, Positive BS, no hepatomegaly Ext: no C/C/E, distal pulses intact and equal Skin: warm/dry no rash Neuro: CNII-XII intact, strength intact and equal bilaterally  Lab Results:  Basename 01/18/12 0700 01/17/12 1102  WBC 11.8* 10.5  HGB 15.6 15.7  PLT 196 183   No results found for this basename: NA:2,K:2,CL:2,CO2:2,GLUCOSE:2,BUN:2,CREATININE:2 in the last 72 hours No results found for this basename: TROPONINI:2,CK,MB:2 in the last 72 hours  Tele: sinus rhythm without arrhythmia  Assessment/Plan:  1. Unstable angina with severe left main and 3 vessel CAD 2. Severe O2 dependent COPD 3. HTN 4. Hyperlipidemia  Will continue IV heparin, IV NTG, and current medical Rx. Pt has been loaded with plavix and will have P2Y12 testing in the AM. If his PRU is greater than 208, would change plavix to Brilinta 90 mg BID starting tomorrow AM. Plan for high-risk PCI Monday morning unless he has recurrent angina uncontrolled by NTG. I have carefully reviewed his cath films, and PCI will be extremely high-risk because of critical Left Main stenosis, total occlusion of the RCA, and  severe LAD and LCx stenoses with a background of moderate-severe LV dysfunction and severe lung disease. Will plan on hemodynamic support with an Impella device because of the high procedural risk. Despite his medical comorbidities, he remains functional and cognitively intact, so I feel PCI is clearly indicated. Medical therapy is not an option in this patient with Class 4 Angina on aggressive medical Rx including IV heparin and NTG.  Tonny Bollman, M.D. 01/18/2012, 8:09 AM

## 2012-01-19 ENCOUNTER — Other Ambulatory Visit: Payer: Self-pay | Admitting: Physician Assistant

## 2012-01-19 DIAGNOSIS — I200 Unstable angina: Secondary | ICD-10-CM

## 2012-01-19 DIAGNOSIS — I2510 Atherosclerotic heart disease of native coronary artery without angina pectoris: Secondary | ICD-10-CM

## 2012-01-19 LAB — PLATELET INHIBITION P2Y12: Platelet Function  P2Y12: 201 [PRU] (ref 194–418)

## 2012-01-19 LAB — BASIC METABOLIC PANEL
CO2: 34 mEq/L — ABNORMAL HIGH (ref 19–32)
Calcium: 9.6 mg/dL (ref 8.4–10.5)
Chloride: 95 mEq/L — ABNORMAL LOW (ref 96–112)
Potassium: 3.7 mEq/L (ref 3.5–5.1)
Sodium: 138 mEq/L (ref 135–145)

## 2012-01-19 LAB — CBC
Hemoglobin: 14.9 g/dL (ref 13.0–17.0)
MCHC: 35.1 g/dL (ref 30.0–36.0)
RBC: 4.37 MIL/uL (ref 4.22–5.81)
WBC: 9.6 10*3/uL (ref 4.0–10.5)

## 2012-01-19 LAB — HEPARIN LEVEL (UNFRACTIONATED): Heparin Unfractionated: 0.69 IU/mL (ref 0.30–0.70)

## 2012-01-19 MED ORDER — SODIUM CHLORIDE 0.9 % IV SOLN
INTRAVENOUS | Status: DC
  Administered 2012-01-20: 04:00:00 via INTRAVENOUS

## 2012-01-19 MED ORDER — SODIUM CHLORIDE 0.9 % IJ SOLN: 3.0000 mL | INTRAMUSCULAR | Status: DC | PRN

## 2012-01-19 MED ORDER — ASPIRIN 81 MG PO CHEW: 324.0000 mg | CHEWABLE_TABLET | ORAL | Status: AC

## 2012-01-19 MED ORDER — SODIUM CHLORIDE 0.9 % IV SOLN: 250.0000 mL | INTRAVENOUS | Status: DC | PRN

## 2012-01-19 MED ORDER — SODIUM CHLORIDE 0.9 % IJ SOLN: 3.0000 mL | Freq: Two times a day (BID) | INTRAMUSCULAR | Status: DC

## 2012-01-19 MED ORDER — DIAZEPAM 5 MG PO TABS
5.0000 mg | ORAL_TABLET | ORAL | Status: AC
  Administered 2012-01-20: 13:00:00 5 mg via ORAL
  Filled 2012-01-19: qty 1

## 2012-01-19 NOTE — Progress Notes (Signed)
SUBJECTIVE: Denies any CP this AM. C/o orthopnea, but no PND  Filed Vitals:   01/19/12 0010 01/19/12 0405 01/19/12 0500 01/19/12 0800  BP: 117/90 91/52  116/79  Pulse: 57 58  64  Temp: 97.6 F (36.4 C) 97.5 F (36.4 C)  97.7 F (36.5 C)  TempSrc: Oral Oral  Oral  Resp: 25 22  28   Height:      Weight:   168 lb 6.9 oz (76.4 kg)   SpO2: 96% 96%  95%    Intake/Output Summary (Last 24 hours) at 01/19/12 1051 Last data filed at 01/19/12 0900  Gross per 24 hour  Intake   1411 ml  Output   1600 ml  Net   -189 ml    TELEMETRY: Reviewed telemetry pt in NSR/SB 50-60s  LABS: Basic Metabolic Panel:  Basename 01/19/12 0515  NA 138  K 3.7  CL 95*  CO2 34*  GLUCOSE 106*  BUN 22  CREATININE 1.40*  CALCIUM 9.6  MG --  PHOS --   Liver Function Tests: No results found for this basename: AST:2,ALT:2,ALKPHOS:2,BILITOT:2,PROT:2,ALBUMIN:2 in the last 72 hours No results found for this basename: LIPASE:2,AMYLASE:2 in the last 72 hours CBC:  Basename 01/19/12 0515 01/18/12 0700  WBC 9.6 11.8*  NEUTROABS -- --  HGB 14.9 15.6  HCT 42.5 43.4  MCV 97.3 96.4  PLT 165 196   Cardiac Enzymes: No results found for this basename: CKTOTAL:3,CKMB:3,CKMBINDEX:3,TROPONINI:3 in the last 72 hours BNP: No components found with this basename: POCBNP:3 D-Dimer: No results found for this basename: DDIMER:2 in the last 72 hours Hemoglobin A1C: No results found for this basename: HGBA1C in the last 72 hours Fasting Lipid Panel: No results found for this basename: CHOL,HDL,LDLCALC,TRIG,CHOLHDL,LDLDIRECT in the last 72 hours Thyroid Function Tests: No results found for this basename: TSH,T4TOTAL,FREET3,T3FREE,THYROIDAB in the last 72 hours Anemia Panel: No results found for this basename: VITAMINB12,FOLATE,FERRITIN,TIBC,IRON,RETICCTPCT in the last 72 hours  RADIOLOGY: Dg Chest 2 View  01/14/2012  *RADIOLOGY REPORT*  Clinical Data: Left-sided chest pain, left arm numbness, cough and congestion,  smoking his  CHEST - 2 VIEW  Comparison: It chest x-ray of 07/11/2010  Findings: Previously described scarring in the lingula appears stable.  No active infiltrate or effusion is seen.  The lungs remain slightly hyperaerated.  Mild cardiomegaly is stable.  Median sternotomy sutures are stable.  There are degenerative changes throughout the thoracic spine.  IMPRESSION: No active lung disease.  Stable scarring in the lingula with no change in hyperaeration.  Original Report Authenticated By: Juline Patch, M.D.    PHYSICAL EXAM: GENERAL: 78 yom, sitting upright; NAD HEENT: NCAT, PERRLA, EOMI; sclera clear; no xanthelasma NECK: palpable bilateral carotid pulses, no bruits; no JVD; no TM LUNGS: diminished BS, faint crackles; no wheezes CARDIAC: RRR (S1, S2); diminished heart sounds; no significant murmurs; no rubs or gallops ABDOMEN: soft, non-tender; intact BS EXTREMETIES: intact distal pulses; no significant peripheral edema SKIN: warm/dry; no obvious rash/lesions MUSCULOSKELETAL: no joint deformity NEURO: no focal deficit; NL affect  ASSESSMENT AND PLAN:  Active Problems: 1 Unstable angina with severe left main and 3 vessel CAD   - EF 45-50%, by recent echo 2 Severe O2 dependent COPD  3 HTN  4 Hyperlipidemia  Continue current medication regimen, including IV NTG and IV Heparin.  P2Y12 201, this AM. Therefore, will continue Plavix, as recommended. Proceed with high risk PCI in AM.   Gene Serpe Patient examined and agree.  Valera Castle, MD 01/19/2012 11:31 AM

## 2012-01-19 NOTE — Progress Notes (Signed)
ANTICOAGULATION CONSULT NOTE - Follow Up Consult  Pharmacy Consult for Heparin Indication: Severe CAD awaiting PCI and hx LV apical aneurysm  Assessment:  78 y.o. male resumed on heparin post cath on 2/28 for severe CAD while PCI planned for Monday (3/4) and bridging while INR <2 for history of LV apical aneurysm.  Heparin level today remains therapeutic at 0.69, but is slowly increasing, so will adjust down slightly to keep in goal range.  No bleeding issues reported.  PTA Warfarin dose: 5mg  daily, except 2.5mg  on Thu & Sun  Pharmacy System-Based Medication Review: Infectious Disease: Afebrile, WBC WNL, on no abx Cardiovascular: Hx CAD(MI)/DL/HTN/LVH/Afib and presented with CP + [place], and found to have severe CAD. Per cards not CABG candidate d/t severe lung disease so to undergo PCI on 3/4. C/o CP this a.m., relieved with IV NTG. BP/24h: 97-140/82, HR: 60-70. On ASA, lasix/kcl, HCTZ, irbesartan, lopressor, fish oil, ranexa, crestor. Loaded with Plavix & will undergo P2Y12 testing. Endocrinology: A1c 5.8, CBGs at goal. On no SSI Nephrology: SCr 1.4, CrCl~21mL/min, lytes ok and stable Pulmonary: Hx COPD. On spiriva, home theodur (no levels this admission, monitor for drug interactions) Hematology / Oncology: Hgb/Hct/Plt stable Best Practices: Hep IV for VTE px, home meds addressed  Goals of Therapy: Heparin level 0.3-0.7  Plan: 1. Decrease heparin IV rate to 1100 units/hr 2. Check HL in AM with daily CBC 3. Continue to hold coumadin for PCI on Monday  Alexxis Mackert M. Saul Fordyce, PharmD Pager: [phone]  01/19/2012 9:29 AM   Allergies  Allergen Reactions  . Indocin     unknown  . Lipitor (Atorvastatin Calcium)     Leg pain  . Pravachol     Leg pain  . Procardia (Nifedipine)   . Zaroxolyn (Metolazone)     Patient Measurements: Height: 5\' 7"  (170.2 cm) Weight: 168 lb 6.9 oz (76.4 kg) IBW/kg (Calculated) : 66.1  Heparin Dosing Weight: 76.2 kg  Vital Signs: Temp: 97.7 F (36.5 C)  (03/03 0800) Temp src: Oral (03/03 0800) BP: 116/79 mmHg (03/03 0800) Pulse Rate: 64  (03/03 0800)  Labs:  Basename 01/19/12 0515 01/18/12 0700 01/17/12 1102  HGB 14.9 15.6 --  HCT 42.5 43.4 44.4  PLT 165 196 183  APTT -- -- --  LABPROT -- -- --  INR -- -- --  HEPARINUNFRC 0.69 0.64 0.36  CREATININE 1.40* -- --  CKTOTAL -- -- --  CKMB -- -- --  TROPONINI -- -- --   Estimated Creatinine Clearance: 42 ml/min (by C-G formula based on Cr of 1.4).   Medications:  Prescriptions prior to admission  Medication Sig Dispense Refill  . albuterol (PROAIR HFA) 108 (90 BASE) MCG/ACT inhaler Inhale 2 puffs into the lungs every 6 (six) hours as needed for wheezing.  1 Inhaler  5  . ALPRAZolam (XANAX) 0.25 MG tablet Take 1 tablet (0.25 mg total) by mouth every 6 (six) hours as needed for anxiety.  30 tablet  5  . aspirin 81 MG tablet Take 81 mg by mouth daily.        . calcium carbonate (OS-CAL - DOSED IN MG OF ELEMENTAL CALCIUM) 1250 MG tablet Take 1 tablet by mouth daily.      . calcium carbonate 200 MG capsule Take 500 mg by mouth daily.        . fish oil-omega-3 fatty acids 1000 MG capsule Take 2 g by mouth daily.        . furosemide (LASIX) 40 MG tablet Take 60 mg  by mouth daily.       Marland Kitchen guaiFENesin (MUCINEX) 600 MG 12 hr tablet Take 1,200 mg by mouth 2 (two) times daily as needed.        Marland Kitchen HYDROcodone-acetaminophen (VICODIN) 5-500 MG per tablet Take 1 tablet by mouth every 6 (six) hours as needed.        . isosorbide mononitrate (IMDUR) 60 MG 24 hr tablet Take 1 tablet (60 mg total) by mouth 2 (two) times daily.  60 tablet  11  . levalbuterol (XOPENEX HFA) 45 MCG/ACT inhaler Inhale 1-2 puffs into the lungs every 4 (four) hours as needed. For shortness of breath      . meclizine (ANTIVERT) 25 MG tablet Take 25 mg by mouth 3 (three) times daily as needed. For dizziness      . metoprolol (LOPRESSOR) 50 MG tablet Take 50 mg by mouth 2 (two) times daily.      . potassium chloride SA  (K-DUR,KLOR-CON) 20 MEQ tablet Take 20 mEq by mouth daily.        . ranolazine (RANEXA) 500 MG 12 hr tablet Take 1 tablet (500 mg total) by mouth 2 (two) times daily.  60 tablet  3  . rosuvastatin (CRESTOR) 10 MG tablet Take 10 mg by mouth daily.      . sodium bicarbonate 650 MG tablet Take 650 mg by mouth daily.       Marland Kitchen SPIRIVA HANDIHALER 18 MCG inhalation capsule Place 1 capsule into inhaler and inhale Daily.      . temazepam (RESTORIL) 30 MG capsule Take 30 mg by mouth at bedtime as needed. For sleep      . theophylline (THEODUR) 300 MG 12 hr tablet Take 1 tablet (300 mg total) by mouth daily.  30 tablet  11  . valsartan-hydrochlorothiazide (DIOVAN-HCT) 160-25 MG per tablet Take 1 tablet by mouth daily.       Marland Kitchen warfarin (COUMADIN) 5 MG tablet Take 2.5-5 mg by mouth daily at 6 PM. Take 1 tablet on Mon Tue, Wed,Fri,Sat . Take 0.5 tablet on Sun, Thur.      . nitroGLYCERIN (NITROSTAT) 0.4 MG SL tablet Place 0.4 mg under the tongue every 5 (five) minutes as needed.         Scheduled:     . aspirin  324 mg Oral Pre-Cath  . aspirin EC  81 mg Oral Daily  . aspirin EC  81 mg Oral Daily  . calcium carbonate  1 tablet Oral Daily  . clopidogrel  75 mg Oral Q breakfast  . furosemide  60 mg Oral Daily  . hydrochlorothiazide  25 mg Oral Daily  . irbesartan  150 mg Oral Daily  . metoprolol  50 mg Oral BID  . omega-3 acid ethyl esters  2 g Oral Daily  . potassium chloride SA  20 mEq Oral Daily  . ranolazine  500 mg Oral BID  . rosuvastatin  10 mg Oral q1800  . sodium bicarbonate  650 mg Oral Daily  . sodium chloride  3 mL Intravenous Q12H  . sodium chloride  3 mL Intravenous Q12H  . sodium chloride  3 mL Intravenous Q12H  . theophylline  300 mg Oral Daily  . tiotropium  1 capsule Inhalation Daily

## 2012-01-20 ENCOUNTER — Encounter (HOSPITAL_COMMUNITY)
Admission: EM | Disposition: A | Discharge status: Home / Self Care | Payer: Self-pay | Source: Ambulatory Visit | Attending: Internal Medicine

## 2012-01-20 ENCOUNTER — Other Ambulatory Visit: Payer: Self-pay

## 2012-01-20 DIAGNOSIS — I2510 Atherosclerotic heart disease of native coronary artery without angina pectoris: Secondary | ICD-10-CM

## 2012-01-20 DIAGNOSIS — I5023 Acute on chronic systolic (congestive) heart failure: Secondary | ICD-10-CM

## 2012-01-20 HISTORY — PX: PERCUTANEOUS CORONARY STENT INTERVENTION (PCI-S): SHX5485

## 2012-01-20 LAB — CBC
HCT: 41.5 % (ref 39.0–52.0)
MCHC: 35.4 g/dL (ref 30.0–36.0)
RDW: 14 % (ref 11.5–15.5)

## 2012-01-20 LAB — BASIC METABOLIC PANEL
BUN: 27 mg/dL — ABNORMAL HIGH (ref 6–23)
Creatinine, Ser: 1.31 mg/dL (ref 0.50–1.35)
GFR calc Af Amer: 59 mL/min — ABNORMAL LOW (ref >90)
GFR calc non Af Amer: 51 mL/min — ABNORMAL LOW (ref >90)
Potassium: 3.7 mEq/L (ref 3.5–5.1)

## 2012-01-20 LAB — POCT ACTIVATED CLOTTING TIME: Activated Clotting Time: 435 seconds

## 2012-01-20 LAB — HEPARIN LEVEL (UNFRACTIONATED): Heparin Unfractionated: 0.71 IU/mL — ABNORMAL HIGH (ref 0.30–0.70)

## 2012-01-20 SURGERY — PERCUTANEOUS CORONARY STENT INTERVENTION (PCI-S)
Anesthesia: LOCAL

## 2012-01-20 MED ORDER — SODIUM CHLORIDE 0.9 % IV SOLN: INTRAVENOUS | Status: AC

## 2012-01-20 MED ORDER — LIDOCAINE HCL (PF) 1 % IJ SOLN
INTRAMUSCULAR | Status: AC
  Filled 2012-01-20: qty 30

## 2012-01-20 MED ORDER — VERAPAMIL HCL 2.5 MG/ML IV SOLN
INTRAVENOUS | Status: AC
  Filled 2012-01-20: qty 2

## 2012-01-20 MED ORDER — SODIUM CHLORIDE 0.9 % IV SOLN
1.7500 mg/kg/h | INTRAVENOUS | Status: DC
  Filled 2012-01-20: qty 250

## 2012-01-20 MED ORDER — HEPARIN (PORCINE) IN NACL 2-0.9 UNIT/ML-% IJ SOLN
INTRAMUSCULAR | Status: AC
  Filled 2012-01-20: qty 2000

## 2012-01-20 MED ORDER — ONDANSETRON HCL 4 MG/2ML IJ SOLN: 4.0000 mg | Freq: Four times a day (QID) | INTRAMUSCULAR | Status: DC | PRN

## 2012-01-20 MED ORDER — MIDAZOLAM HCL 2 MG/2ML IJ SOLN
INTRAMUSCULAR | Status: AC
  Filled 2012-01-20: qty 2

## 2012-01-20 MED ORDER — FENTANYL CITRATE 0.05 MG/ML IJ SOLN
INTRAMUSCULAR | Status: AC
  Filled 2012-01-20: qty 2

## 2012-01-20 MED ORDER — MUPIROCIN 2 % EX OINT
TOPICAL_OINTMENT | Freq: Two times a day (BID) | CUTANEOUS | Status: DC
  Administered 2012-01-20 – 2012-01-22 (×4): via NASAL
  Filled 2012-01-20: qty 22

## 2012-01-20 MED ORDER — NITROGLYCERIN 0.2 MG/ML ON CALL CATH LAB
INTRAVENOUS | Status: AC
  Filled 2012-01-20: qty 1

## 2012-01-20 MED ORDER — HEPARIN (PORCINE) IN NACL 2-0.9 UNIT/ML-% IJ SOLN
INTRAMUSCULAR | Status: AC
  Filled 2012-01-20: qty 1000

## 2012-01-20 MED ORDER — CHLORHEXIDINE GLUCONATE CLOTH 2 % EX PADS
6.0000 | MEDICATED_PAD | Freq: Every day | CUTANEOUS | Status: DC
  Administered 2012-01-21 – 2012-01-22 (×2): 6 via TOPICAL

## 2012-01-20 MED ORDER — ATROPINE SULFATE 1 MG/ML IJ SOLN
INTRAMUSCULAR | Status: AC
  Filled 2012-01-20: qty 1

## 2012-01-20 MED ORDER — ACETAMINOPHEN 325 MG PO TABS: 650.0000 mg | ORAL_TABLET | ORAL | Status: DC | PRN

## 2012-01-20 NOTE — Progress Notes (Signed)
SUBJECTIVE: Denies any CP this AM. C/o orthopnea, but no PND.  For PCI today.  Filed Vitals:   01/19/12 1959 01/20/12 0054 01/20/12 0431 01/20/12 0757  BP: 129/71 124/67 130/65 136/64  Pulse: 76 63 61 60  Temp: 97.5 F (36.4 C) 98.1 F (36.7 C) 98.1 F (36.7 C) 98.5 F (36.9 C)  TempSrc: Oral Oral Oral Oral  Resp: 25 19 18 19   Height:      Weight:   167 lb 15.9 oz (76.2 kg)   SpO2: 94% 96% 96% 93%    Intake/Output Summary (Last 24 hours) at 01/20/12 0847 Last data filed at 01/20/12 0758  Gross per 24 hour  Intake 1343.25 ml  Output   1100 ml  Net 243.25 ml    TELEMETRY: Reviewed telemetry pt in NSR/SB 50-60s  LABS: Basic Metabolic Panel:  Basename 01/19/12 0515  NA 138  K 3.7  CL 95*  CO2 34*  GLUCOSE 106*  BUN 22  CREATININE 1.40*  CALCIUM 9.6  MG --  PHOS --   Liver Function Tests: No results found for this basename: AST:2,ALT:2,ALKPHOS:2,BILITOT:2,PROT:2,ALBUMIN:2 in the last 72 hours No results found for this basename: LIPASE:2,AMYLASE:2 in the last 72 hours CBC:  Basename 01/20/12 0640 01/19/12 0515  WBC 9.2 9.6  NEUTROABS -- --  HGB 14.7 14.9  HCT 41.5 42.5  MCV 96.5 97.3  PLT 188 165   Cardiac Enzymes: No results found for this basename: CKTOTAL:3,CKMB:3,CKMBINDEX:3,TROPONINI:3 in the last 72 hours BNP: No components found with this basename: POCBNP:3 D-Dimer: No results found for this basename: DDIMER:2 in the last 72 hours Hemoglobin A1C: No results found for this basename: HGBA1C in the last 72 hours Fasting Lipid Panel: No results found for this basename: CHOL,HDL,LDLCALC,TRIG,CHOLHDL,LDLDIRECT in the last 72 hours Thyroid Function Tests: No results found for this basename: TSH,T4TOTAL,FREET3,T3FREE,THYROIDAB in the last 72 hours Anemia Panel: No results found for this basename: VITAMINB12,FOLATE,FERRITIN,TIBC,IRON,RETICCTPCT in the last 72 hours  RADIOLOGY: Dg Chest 2 View  01/14/2012  *RADIOLOGY REPORT*  Clinical Data: Left-sided  chest pain, left arm numbness, cough and congestion, smoking his  CHEST - 2 VIEW  Comparison: It chest x-ray of 07/11/2010  Findings: Previously described scarring in the lingula appears stable.  No active infiltrate or effusion is seen.  The lungs remain slightly hyperaerated.  Mild cardiomegaly is stable.  Median sternotomy sutures are stable.  There are degenerative changes throughout the thoracic spine.  IMPRESSION: No active lung disease.  Stable scarring in the lingula with no change in hyperaeration.  Original Report Authenticated By: Juline Patch, M.D.    PHYSICAL EXAM: GENERAL: 78 yom, sitting upright; NAD HEENT: NCAT, PERRLA, EOMI; sclera clear; no xanthelasma NECK: palpable bilateral carotid pulses, no bruits; no JVD; no TM LUNGS: diminished BS, faint crackles; no wheezes CARDIAC: RRR (S1, S2); diminished heart sounds; no significant murmurs; no rubs or gallops ABDOMEN: soft, non-tender; intact BS EXTREMETIES: intact distal pulses; no significant peripheral edema SKIN: warm/dry; no obvious rash/lesions MUSCULOSKELETAL: no joint deformity NEURO: no focal deficit; NL affect  ASSESSMENT AND PLAN:  Active Problems: 1 Unstable angina with severe left main and 3 vessel CAD   - EF 45-50%, by recent echo 2 Severe O2 dependent COPD  3 HTN  4 Hyperlipidemia  Continue current medication regimen, including IV NTG and IV Heparin.  P2Y12 201, this AM. Therefore, will continue Plavix, as recommended. Proceed with high risk PCI today.     Cassell Clement, MD 01/20/2012 8:47 AM

## 2012-01-20 NOTE — Progress Notes (Signed)
   CARE MANAGEMENT NOTE 01/20/2012  Patient:  Sean Figueroa, Sean Figueroa   Account Number:  [id number]  Date Initiated:  01/20/2012  Documentation initiated by:  GRAVES-BIGELOW,Jaskirat Zertuche  Subjective/Objective Assessment:   Pt admitted with cp. Pt for PCI today.     Action/Plan:   Anticipated DC Date:  01/23/2012   Anticipated DC Plan:  HOME W HOME HEALTH SERVICES      DC Planning Services  CM consult      Choice offered to / List presented to:             Status of service:  In process, will continue to follow Medicare Important Message given?   (If response is "NO", the following Medicare IM given date fields will be blank) Date Medicare IM given:   Date Additional Medicare IM given:    Discharge Disposition:    Per UR Regulation:    Comments:  01-20-12 1546 Tomi Bamberger, RN,BSN [phone] CM will monitor post procedure for d/c disposition.

## 2012-01-20 NOTE — CV Procedure (Signed)
CARDIAC CATH NOTE  Name: Sean Figueroa MRN: [id number] DOB: 07/31/1933  Procedure: PTCA and stenting of the left circumflex, PTCA and stenting of the left mainstem, IVIS of the left mainstem, insertion of an Impella hemodynamic support device  Indication: This is a 78 year old gentleman with severe COPD on home oxygen. He presented with unstable angina and has been taking nitroglycerin many times per day due to chest pain at rest and with minimal activity. He has a known LV apical aneurysm and has been on long-term Coumadin. He also was noted to have a markedly elevated BNP on admission, consistent with acute on chronic systolic heart failure. He underwent diagnostic catheterization last week and this showed critical distal left main stenosis, total occlusion of the RCA, and severe stenosis of the ostial LAD and mid circumflex. He had a cardiac surgery evaluation but was not felt to be a reasonable candidate for surgery because of his severe lung disease. We elected to proceed with high-risk PCI of the left main and left circumflex with use of an Impella support device so that hemodynamic support to the provided considering his high risk coronary anatomy, severe lung disease, and severe LV dysfunction.  Procedural Details: The right groin was prepped, draped, and anesthetized with 1% lidocaine. Using the modified Seldinger technique, a 5 Fr sheath was introduced into the right femoral artery.  I had initially planned to use the right femoral artery for the support device in the right radial artery for PCI. However, the right femoral artery was heavily calcified and the bifurcation was high. The sheath entry site was right at the junction of the common femoral and superficial femoral arteries. Therefore, I elected to access the left femoral artery and this was done successfully. The vessel is Pre-closed with 2 Perclose devices. A 13 French sheath was introduced. Weight-based bivalirudin was given for  anticoagulation. Once a therapeutic ACT was achieved, a 6 [place] XB LAD guide catheter was inserted.  The Impella pump was introduced through the 13 French sheath into the left ventricle. We achieved a good motor current and good cardiac output with the Impella device. A cougar coronary guidewire was used to crossinto the LCx.  A second wire was advanced into the LAD. The distal left main was dilated with a 2.5 x 15 mm balloon. I then advanced a 2.0 x 20 mm balloon into the left circumflex and it was dilated multiple times to 10 and 12 atmospheres. The left circumflex had critical long segment disease. I tried to advance a 2.5 x 32 mm from his element drug-eluting stent but it would not cross into the circumflex. A grand slam wire was advanced as a buddy wire. The stent was still not cross. I then dilated the circumflex again with a 2.25 x 20 mm Quantum Apex noncompliant balloon to a maximum pressure of 12 atmospheres. I was finally able to advance the stent into the left circumflex and the grand slam wire was removed. The stent was deployed at 14 atmospheres. The stent was then post dilated with a 2.5 x 20 mm Quantum Apex balloon to a maximum pressure of 20 atmospheres. There was an excellent result in the circumflex and attention was then turned to the distal left main. Intravascular ultrasound was performed and the LAD with pullback back across the left main. This demonstrated a reference vessel diameter in the LAD of approximately 3.5 mm. The left main was then stented with a 3.5 x 16 mm Promus element stent which was  deployed at 14 atmospheres so that it crossed from the distal left main into the LAD across the circumflex. The stent was postdilated with a 4.0 x 12 mm [place] Trek balloon taken to 16 atmospheres. Followup IVUS was performed and this showed an excellent result with wide patency of the stent, good apposition throughout, and wide patency of the circumflex ostium. I felt the patient had received the  maximum benefit of his intervention and the guide catheter and wire were removed. The Impella output was turned down slowly and the device was removed. The 13 French sheath in the left femoral artery was removed and both Perclose devices were deployed. The right femoral sheath was left in place and will be removed after the Angiomax is off for 2 hours. The patient tolerated the entire procedure well. There were no immediate complications. Following PCI, there was 0% residual stenosis and TIMI-3 flow at the lesion site in the left circumflex and the left main.  Conclusions: Successful high-risk PCI of the left circumflex and left main, guided by intravascular ultrasound and supported hemodynamically with an Impella device.  Recommendations: Asa and Plavix for a minimum of 12 months, and favor lifelong therapy if tolerated.   Tonny Bollman 01/20/2012, 3:57 PM

## 2012-01-20 NOTE — Progress Notes (Signed)
ANTICOAGULATION CONSULT NOTE - Follow Up Consult  Pharmacy Consult for Heparin Indication: Severe CAD awaiting PCI on 3/4 and hx LV apical aneurysm  Allergies  Allergen Reactions  . Indocin     unknown  . Lipitor (Atorvastatin Calcium)     Leg pain  . Pravachol     Leg pain  . Procardia (Nifedipine)   . Zaroxolyn (Metolazone)     Patient Measurements: Height: 5\' 7"  (170.2 cm) Weight: 167 lb 15.9 oz (76.2 kg) IBW/kg (Calculated) : 66.1  Heparin Dosing Weight: 76.2 kg  Vital Signs: Temp: 98.5 F (36.9 C) (03/04 0757) Temp src: Oral (03/04 0757) BP: 136/64 mmHg (03/04 0757) Pulse Rate: 60  (03/04 0757)  Labs:  Basename 01/20/12 0640 01/19/12 0515 01/18/12 0700  HGB 14.7 14.9 --  HCT 41.5 42.5 43.4  PLT 188 165 196  APTT -- -- --  LABPROT -- -- --  INR -- -- --  HEPARINUNFRC 0.71* 0.69 0.64  CREATININE -- 1.40* --  CKTOTAL -- -- --  CKMB -- -- --  TROPONINI -- -- --   Estimated Creatinine Clearance: 42 ml/min (by C-G formula based on Cr of 1.4).   Medications:  Prescriptions prior to admission  Medication Sig Dispense Refill  . albuterol (PROAIR HFA) 108 (90 BASE) MCG/ACT inhaler Inhale 2 puffs into the lungs every 6 (six) hours as needed for wheezing.  1 Inhaler  5  . ALPRAZolam (XANAX) 0.25 MG tablet Take 1 tablet (0.25 mg total) by mouth every 6 (six) hours as needed for anxiety.  30 tablet  5  . aspirin 81 MG tablet Take 81 mg by mouth daily.        . calcium carbonate (OS-CAL - DOSED IN MG OF ELEMENTAL CALCIUM) 1250 MG tablet Take 1 tablet by mouth daily.      . calcium carbonate 200 MG capsule Take 500 mg by mouth daily.        . fish oil-omega-3 fatty acids 1000 MG capsule Take 2 g by mouth daily.        . furosemide (LASIX) 40 MG tablet Take 60 mg by mouth daily.       Marland Kitchen guaiFENesin (MUCINEX) 600 MG 12 hr tablet Take 1,200 mg by mouth 2 (two) times daily as needed.        Marland Kitchen HYDROcodone-acetaminophen (VICODIN) 5-500 MG per tablet Take 1 tablet by mouth  every 6 (six) hours as needed.        . isosorbide mononitrate (IMDUR) 60 MG 24 hr tablet Take 1 tablet (60 mg total) by mouth 2 (two) times daily.  60 tablet  11  . levalbuterol (XOPENEX HFA) 45 MCG/ACT inhaler Inhale 1-2 puffs into the lungs every 4 (four) hours as needed. For shortness of breath      . meclizine (ANTIVERT) 25 MG tablet Take 25 mg by mouth 3 (three) times daily as needed. For dizziness      . metoprolol (LOPRESSOR) 50 MG tablet Take 50 mg by mouth 2 (two) times daily.      . potassium chloride SA (K-DUR,KLOR-CON) 20 MEQ tablet Take 20 mEq by mouth daily.        . ranolazine (RANEXA) 500 MG 12 hr tablet Take 1 tablet (500 mg total) by mouth 2 (two) times daily.  60 tablet  3  . rosuvastatin (CRESTOR) 10 MG tablet Take 10 mg by mouth daily.      . sodium bicarbonate 650 MG tablet Take 650 mg by mouth daily.       Marland Kitchen  SPIRIVA HANDIHALER 18 MCG inhalation capsule Place 1 capsule into inhaler and inhale Daily.      . temazepam (RESTORIL) 30 MG capsule Take 30 mg by mouth at bedtime as needed. For sleep      . theophylline (THEODUR) 300 MG 12 hr tablet Take 1 tablet (300 mg total) by mouth daily.  30 tablet  11  . valsartan-hydrochlorothiazide (DIOVAN-HCT) 160-25 MG per tablet Take 1 tablet by mouth daily.       Marland Kitchen warfarin (COUMADIN) 5 MG tablet Take 2.5-5 mg by mouth daily at 6 PM. Take 1 tablet on Mon Tue, Wed,Fri,Sat . Take 0.5 tablet on Sun, Thur.      . nitroGLYCERIN (NITROSTAT) 0.4 MG SL tablet Place 0.4 mg under the tongue every 5 (five) minutes as needed.         Scheduled:     . aspirin  324 mg Oral Pre-Cath  . aspirin  324 mg Oral Pre-Cath  . aspirin EC  81 mg Oral Daily  . aspirin EC  81 mg Oral Daily  . calcium carbonate  1 tablet Oral Daily  . clopidogrel  75 mg Oral Q breakfast  . diazepam  5 mg Oral On Call  . furosemide  60 mg Oral Daily  . hydrochlorothiazide  25 mg Oral Daily  . irbesartan  150 mg Oral Daily  . metoprolol  50 mg Oral BID  . omega-3 acid  ethyl esters  2 g Oral Daily  . potassium chloride SA  20 mEq Oral Daily  . ranolazine  500 mg Oral BID  . rosuvastatin  10 mg Oral q1800  . sodium bicarbonate  650 mg Oral Daily  . sodium chloride  3 mL Intravenous Q12H  . sodium chloride  3 mL Intravenous Q12H  . sodium chloride  3 mL Intravenous Q12H  . sodium chloride  3 mL Intravenous Q12H  . theophylline  300 mg Oral Daily  . tiotropium  1 capsule Inhalation Daily    Assessment: 78 y.o. M resumed on heparin post cath on 2/28 for severe CAD while awaiting PCI planned for today (3/4) and anticoagulation while INR <2 for history of LV apical aneurysm. Heparin level this a.m is SUPRAtherapeutic (HL 0.71, goal of 0.3-0.7), will go ahead and decrease rate while patient awaits PCI early this afternoon. Hgb/Hct/Plt stable. No s/sx of bleeding noted.   Pharmacy System-Based Medication Review: Anticoagulation: Heparin while awaiting PCI and anticoagulation while INR <2 and hx LV apical aneurysm. HL 0.71, Hgb/Hct/Plt stable. No s/sx of bleeding noted. PTA verified as 5 mg daily EXCEPT for 2.5 mg on Thu/Sun. F/u plans post-PCI -- hopefully will resume hep bridge to warf. Infectious Disease: Afebrile, WBC WNL, on no abx Cardiovascular: Hx CAD(MI)/DL/HTN/LVH/Afib and presented with CP + [place], and found to have severe CAD. Per cards not CABG candidate d/t severe lung disease so to undergo PCI on 3/4. No c/o CP this a.m. BP/24h: 90-140/50-60, HR: 60-80. On ASA, lasix/kcl, HCTZ, irbesartan, lopressor, fish oil, ranexa, crestor. Loaded with Plavix on 3/1. [id number] & will undergo P2Y12 testing. Endocrinology: A1c 5.8, CBGs at goal. On no SSI Nephrology: SCr 1.4, CrCl~40-45 mL/min, lytes ok and stable Pulmonary: Hx COPD. On spiriva, home theodur (no levels this admission, monitor for drug interactions) Hematology / Oncology: Hgb/Hct/Plt stable Best Practices: Hep IV for VTE px, home meds addressed  Goal of Therapy:  Heparin level 0.3-0.7 units/ml     Plan:  1. Decrease heparin drip rate to 1000 units/hr (10 ml/hr)  2. Will continue to monitor for any signs/symptoms of bleeding and will follow up with plans post-PCI today.  Georgina Pillion, PharmD, BCPS Clinical Pharmacist Pager: [phone] 01/20/2012 9:03 AM

## 2012-01-20 NOTE — Progress Notes (Signed)
Right Groin 26F Sheath pulled. Manual Pressure held x's 35 minutes. Site level 1. Related to bruising and slight hematoma. During the initial pull pt was coughing frequently. Dr. Excell Seltzer called to the bedside for assistance. He held pressure for a few minutes and thought the groin looked fine but a slight hematoma was present. Post sheath removal instructions given to patient. Vital signs stable throughout the pull. Pressure Dressing applied, will continue to monitor.   Laurena Bering, RN

## 2012-01-21 DIAGNOSIS — M79609 Pain in unspecified limb: Secondary | ICD-10-CM

## 2012-01-21 LAB — CBC
HCT: 41 % (ref 39.0–52.0)
MCV: 98.1 fL (ref 78.0–100.0)
Platelets: 179 10*3/uL (ref 150–400)
RBC: 4.18 MIL/uL — ABNORMAL LOW (ref 4.22–5.81)
WBC: 10.9 10*3/uL — ABNORMAL HIGH (ref 4.0–10.5)

## 2012-01-21 LAB — BASIC METABOLIC PANEL
BUN: 25 mg/dL — ABNORMAL HIGH (ref 6–23)
CO2: 34 mEq/L — ABNORMAL HIGH (ref 19–32)
Chloride: 95 mEq/L — ABNORMAL LOW (ref 96–112)
Creatinine, Ser: 1.26 mg/dL (ref 0.50–1.35)
Potassium: 3.7 mEq/L (ref 3.5–5.1)

## 2012-01-21 LAB — GLUCOSE, CAPILLARY

## 2012-01-21 NOTE — Progress Notes (Addendum)
CARDIAC REHAB PHASE I   PRE:  Rate/Rhythm: 76 SR PVC  BP:  Supine: 103/58  Sitting:   Standing:    SaO2: 930 RA  MODE:  Ambulation: 100 ft   POST:  Rate/Rhythem: 89 SR PVC  BP:  Supine:   Sitting: 109/56  Standing:    SaO2: 90-92% RA 1140-1230  Assisted X 1 to ambulate with hand held assistance. Gait steady VS stable To recliner after walk with call Light in reach. Completed discharge education with pt.He only walked 100 ft,admits to very little walking at home and seems deconditioned. He agrees to [employer]. CRP in [place], will send referral.  Beatrix Fetters

## 2012-01-21 NOTE — Progress Notes (Signed)
Called to pt bedside for groin rebleed post procedure.  Pt underwent cath today with high risk PCI and impella placement.  L groin perclosed, R groin sheath removed around 6 pm 3/3.  Pt getting up and sitting at bedside almost every hour to use urinal per RN report.  Pt then was noted to have a R groin hematoma.  Pressure applied by RN and then I was called.  On arrival, pt stable.  SBP 80-104.  R groin with ecchymoses and mild residual hematoma about 2x2 cm, otherwise everything soft.  1+ DP pulse.  No femoral artery bruit heard.  Given high risk for pseudoaneurysm formation (age, anticoagulation, interventional procedure, etc) - will check groin [place] in am.  Continue to hold pressure overnight and monitor BPs closely.    Hilary Hertz, MD On call cardiology fellow

## 2012-01-21 NOTE — Progress Notes (Signed)
VASCULAR LAB PRELIMINARY  PRELIMINARY  PRELIMINARY  PRELIMINARY  Right groin duplex completed.    Preliminary report:  No evidence of pseudoaneurysm or AV fistula.  Hematoma noted.  Terance Hart, RVT 01/21/2012, 3:12 PM

## 2012-01-21 NOTE — Progress Notes (Signed)
    Subjective:  No chest pain or dyspnea overnight. Had right groin hematoma after sheath pull and had prolonged manual pressure over right groin site. Mild groin pain this morning.  Objective:  Vital Signs in the last 24 hours: Temp:  [97.4 F (36.3 C)-98.5 F (36.9 C)] 97.4 F (36.3 C) (03/05 0325) Pulse Rate:  [56-87] 61  (03/05 0310) Resp:  [16-27] 27  (03/04 2100) BP: (82-150)/(20-106) 120/71 mmHg (03/05 0600) SpO2:  [92 %-100 %] 98 % (03/05 0600) Weight:  [76.1 kg (167 lb 12.3 oz)-76.5 kg (168 lb 10.4 oz)] 76.1 kg (167 lb 12.3 oz) (03/05 0400)  Intake/Output from previous day: 03/04 0701 - 03/05 0700 In: 1660 [P.O.:360; I.V.:1300] Out: 2065 [Urine:2065]  Physical Exam: Pt is alert and oriented, elderly male in NAD HEENT: normal Neck: JVP - normal, carotids 2+= without bruits Lungs: diminished breath sounds bilaterally CV: RRR without murmur or gallop, distant Abd: soft, NT, Positive BS, no hepatomegaly Ext: no C/C/E. Left groin clear, right groin with diffuse but soft hematoma Skin: warm/dry no rash   Lab Results:  Basename 01/21/12 0540 01/20/12 0640  WBC 10.9* 9.2  HGB 14.2 14.7  PLT 179 188    Basename 01/21/12 0540 01/20/12 0937  NA 135 135  K 3.7 3.7  CL 95* 94*  CO2 34* 32  GLUCOSE 112* 107*  BUN 25* 27*  CREATININE 1.26 1.31   No results found for this basename: TROPONINI:2,CK,MB:2 in the last 72 hours  Tele: sinus rhythm, short run of NSVT  Assessment/Plan:  1. Unstable angina now s/p left main and LCx stenting 2. Acute on chronic systolic heart failure - continue avapro, metoprolol, furosemide. Change metoprolol back to Toprol XL. Volume status appears stable. 3. LV apical aneurysm. Long-term warfarin, but now after stenting will plan on ASA and plavix without coumadin. 4. Severe COPD - continue oxygen. Tobacco cessation counseling done. 5. Groin hematoma - stable Hgb, hematoma is soft. Ambulate cautiously today and if stable should be ok for  discharge tomorrow.  Tonny Bollman, M.D. 01/21/2012, 7:19 AM

## 2012-01-21 NOTE — Progress Notes (Signed)
4 in x 2 in hematoma and 6 in x 4 in bruise noted on right groin. Dr. Lurline Idol called to bedside for assessment. Manual pressure held for 45 minutes with BPs taken every 2 minutes.  Systolic BP got as low as 73. Manual pressure stopped for 10 minutes then resumed.  Pt remained hemodynamically stable for the remainder of the time.  After manual pressure ended, hematoma was 1.5 in x 1 in.  Gauze and Tegaderm applied to site. Will continue to monitor.

## 2012-01-21 NOTE — Progress Notes (Signed)
UR Completed/ UPDATED Sean Figueroa, Sean Figueroa F [phone]  

## 2012-01-22 LAB — GLUCOSE, CAPILLARY: Glucose-Capillary: 104 mg/dL — ABNORMAL HIGH (ref 70–99)

## 2012-01-22 MED ORDER — CLOPIDOGREL BISULFATE 75 MG PO TABS: 75.0000 mg | ORAL_TABLET | Freq: Every day | ORAL | Status: DC

## 2012-01-22 NOTE — Progress Notes (Signed)
Patient Name: Sean Figueroa Date of Encounter: 01/22/2012  Active Problems:  COPD (chronic obstructive pulmonary disease)  Intermediate coronary syndrome  Hypokalemia  Acute on chronic systolic heart failure    SUBJECTIVE:Pt denies chest pain or SOB. He is compliant with Rx and promises to never miss a dose. He also says he will not smoke. He has O2 and nebs at home.  OBJECTIVE Filed Vitals:   01/21/12 2224 01/22/12 0221 01/22/12 0703 01/22/12 1100  BP: 119/80 103/64 128/87 106/52  Pulse: 68 75 60 51  Temp: 98 F (36.7 C) 98.3 F (36.8 C) 97.8 F (36.6 C)   TempSrc: Oral Oral Oral   Resp: 18 17 18    Height:      Weight:   168 lb 5.2 oz (76.352 kg)   SpO2: 95% 90% 97%     Intake/Output Summary (Last 24 hours) at 01/22/12 1214 Last data filed at 01/22/12 0849  Gross per 24 hour  Intake   1402 ml  Output    525 ml  Net    877 ml   Weight change: -0.8 oz (-0.024 kg) Wt Readings from Last 3 Encounters:  01/22/12 168 lb 5.2 oz (76.352 kg)  01/22/12 168 lb 5.2 oz (76.352 kg)  01/22/12 168 lb 5.2 oz (76.352 kg)    PHYSICAL EXAM General: frail, elderly ,male, in no acute distress. Head: Normocephalic, atraumatic.  Neck:  JVD not elevated. Lungs:  Resp regular but shallow and unlabored, dry rales. Heart: RRR, S1, S2, no S3, S4, or murmurs. Abdomen: Soft, non-tender, non-distended, BS + x 4.  Extremities: no edema. Large right goin hematoma noted without firmness or tenderness Neuro: Alert and oriented X 3. Moves all extremities spontaneously. Psych: Normal affect.  LABS:  CBC: Basename 01/21/12 0540 01/20/12 0640  WBC 10.9* 9.2  NEUTROABS -- --  HGB 14.2 14.7  HCT 41.0 41.5  MCV 98.1 96.5  PLT 179 188   Basic Metabolic Panel: Basename 01/21/12 0540 01/20/12 0937  NA 135 135  K 3.7 3.7  CL 95* 94*  CO2 34* 32  GLUCOSE 112* 107*  BUN 25* 27*  CREATININE 1.26 1.31  CALCIUM 9.3 9.8  MG -- --  PHOS -- --   BNP: Pro B Natriuretic peptide (BNP)    Date/Time Value Range Status  01/14/2012 11:04 AM 1942.0* 0-450 (pg/mL) Final   TELE: SR with PVCs   Radiology/Studies: Dg Chest 2 View 01/14/2012  *RADIOLOGY REPORT*  Clinical Data: Left-sided chest pain, left arm numbness, cough and congestion, smoking his  CHEST - 2 VIEW  Comparison: It chest x-ray of 07/11/2010  Findings: Previously described scarring in the lingula appears stable.  No active infiltrate or effusion is seen.  The lungs remain slightly hyperaerated.  Mild cardiomegaly is stable.  Median sternotomy sutures are stable.  There are degenerative changes throughout the thoracic spine.  IMPRESSION: No active lung disease.  Stable scarring in the lingula with no change in hyperaeration.  Original Report Authenticated By: Juline Patch, M.D.    Current Medications:     . aspirin EC  81 mg Oral Daily  . calcium carbonate  1 tablet Oral Daily  . Chlorhexidine Gluconate Cloth  6 each Topical Q0600  . clopidogrel  75 mg Oral Q breakfast  . furosemide  60 mg Oral Daily  . hydrochlorothiazide  25 mg Oral Daily  . irbesartan  150 mg Oral Daily  . metoprolol  50 mg Oral BID  . mupirocin ointment  Nasal BID  . omega-3 acid ethyl esters  2 g Oral Daily  . potassium chloride SA  20 mEq Oral Daily  . ranolazine  500 mg Oral BID  . rosuvastatin  10 mg Oral q1800  . sodium bicarbonate  650 mg Oral Daily  . sodium chloride  3 mL Intravenous Q12H  . theophylline  300 mg Oral Daily  . tiotropium  1 capsule Inhalation Daily    ASSESSMENT AND PLAN: Active Problems:  COPD (chronic obstructive pulmonary disease)  Intermediate coronary syndrome  Hypokalemia  Acute on chronic systolic heart failure  Plan: Pt respiratory status is at baseline and he is having no chest pain. OK for discharge, to f/u in the office. D/c coumadin, ASA and Plavix only.  Signed, Theodore Demark , PA-C 12:14 PM 01/22/2012  Patient seen, examined. Available data reviewed. Agree with findings, assessment, and  plan as outlined by Theodore Demark, PA-C. The patient is doing very well post PCI. He has had no further chest pain. We discussed the critical importance of adherence to dual antiplatelet therapy with aspirin and Plavix. He is excited that he no longer has to have Coumadin monitoring. He will followup closely with Dr. Patty Sermons in the office. His right groin hematoma is stable.  Tonny Bollman, M.D. 01/22/2012 1:33 PM

## 2012-01-22 NOTE — Discharge Instructions (Signed)
Follow up with Darrel Hoover, NP or Dr Patty Sermons within 2 weeks, the office will call.  NO HEAVY LIFTING OR SEXUAL ACTIVITY X 7 DAYS. NO DRIVING X 2 DAYS. NO SOAKING BATHS, HOT TUBS, POOLS, ETC., X 5 DAYS.  NO tobacco

## 2012-01-22 NOTE — Discharge Summary (Signed)
CARDIOLOGY DISCHARGE SUMMARY   Patient ID: Sean Figueroa MRN: [id number] DOB/AGE: 1933-06-08 78 y.o.  Admit date: 01/14/2012 Discharge date: 01/22/2012  Primary Discharge Diagnosis:   . Chest pain  . Intermediate coronary syndrome   Secondary Discharge Diagnosis:  Patient Active Problem List  Diagnoses  . Atrial fibrillation  . Apical myocardial infarction greater than eight weeks ago  . COPD (chronic obstructive pulmonary disease)  . Tobacco abuse  . Hypercholesterolemia  . Osteoarthritis        . Hypokalemia  . Acute on chronic systolic heart failure    Consults: Dr Donata Clay  Procedures: Left Heart Cath, Selective Coronary Angiography, LV angiography, PTCA and stenting of the left circumflex, PTCA and stenting of the left mainstem, IVIS of the left mainstem, insertion of an Impella hemodynamic support device, 2d echo, CXR  Hospital Course: [name] is a 78 year old male with a history of coronary artery disease. He had recurrent episodes of chest pain and came to the hospital where he was treated medically and his symptoms improved. He was admitted for further evaluation and treatment.  His cardiac enzymes were negative for MI. He was taken to the cath lab on 01/16/2012. The results are listed below.  Left mainstem: There is critical 90% stenosis of the distal left mainstem  Left anterior descending (LAD): The LAD has 80% ostial stenosis. The remaining portions of the vessel are patent and the vessel wraps around the left ventricular apex. There is a large septal cascade supplying collaterals to the right PDA. There is a diseased first diagonal branch moderate in vessel caliber  Left circumflex (LCx): The left circumflex is severely diseased. There is heavy calcification throughout the mid circumflex. A diffuse 80-90% stenosis in the mid vessel leads into a branching first obtuse marginal.  Right coronary artery (RCA): The right coronary artery is heavily calcified. The  vessel is severely diseased with critical segmental stenoses in the proximal and mid vessel leading into total occlusion at the junction of the mid and distal RCA. The distal branch vessels of the RCA fill from left to right collaterals.  Left ventriculography: There is severe segmental left ventricular systolic dysfunction present. The distal anterolateral, apical, and inferolateral portions of the left ventricle are all akinetic. The basal segments of the LV contract normally. The estimated left ventricular ejection fraction is 35%.  A TCTS consult was called. He was seen by the surgeons who felt that he would not survive median sternotomy. It was elected to treat him with percutaneous intervention. Because of the high-risk procedure, he was loaded with Plavix and a P2Y12 test was checked prior to the procedure. His P2Y12 was 201 which is less than the cut-off of 230. He was continued on Plavix. He was taken back to the cath lab on 01/20/2012. He had the percutaneous intervention described above below.  A 2.5 x 32 mm Promus element drug-eluting stent was inserted into the left circumflex. A 3.5 x 16 mm Promus element stent was inserted into the left main and dilated with hemodynamic assistance of the Impella device.  He had been on Coumadin prior to admission for previous left ventricular apical thrombus. There is also a question of atrial fibrillation but this was not confirmed. He was maintaining sinus rhythm during his hospital stay. With 2 drug-eluting stents, he is to be continued on aspirin and Plavix for at least 12 months. His Coumadin is discontinued. Smoking cessation was encouraged and reinforced. The patient states he will  quit.  On 01/22/2012, [name] was seen by Dr. Excell Seltzer. He will was ambulating with his respiratory status at baseline and having no chest pain. He is considered stable for discharge, to followup as an outpatient.  Labs:   Lab Results  Component Value Date   WBC  10.9* 01/21/2012   HGB 14.2 01/21/2012   HCT 41.0 01/21/2012   MCV 98.1 01/21/2012   PLT 179 01/21/2012    Lab 01/21/12 0540  NA 135  K 3.7  CL 95*  CO2 34*  BUN 25*  CREATININE 1.26  CALCIUM 9.3  PROT --  BILITOT --  ALKPHOS --  ALT --  AST --  GLUCOSE 112*   Lab Results  Component Value Date   CKTOTAL 66 01/15/2012   CKMB 3.6 01/15/2012   TROPONINI <0.30 01/15/2012     Lipid Panel     Component Value Date/Time   CHOL 111 01/15/2012 0542   TRIG 98 01/15/2012 0542   HDL 42 01/15/2012 0542   CHOLHDL 2.6 01/15/2012 0542   VLDL 20 01/15/2012 0542   LDLCALC 49 01/15/2012 0542    Pro B Natriuretic peptide (BNP)  Date/Time Value Range Status  01/14/2012 11:04 AM 1942.0* 0-450 (pg/mL) Final    Radiology:  Dg Chest 2 View 01/14/2012  *RADIOLOGY REPORT*  Clinical Data: Left-sided chest pain, left arm numbness, cough and congestion, smoking his  CHEST - 2 VIEW  Comparison: It chest x-ray of 07/11/2010  Findings: Previously described scarring in the lingula appears stable.  No active infiltrate or effusion is seen.  The lungs remain slightly hyperaerated.  Mild cardiomegaly is stable.  Median sternotomy sutures are stable.  There are degenerative changes throughout the thoracic spine.  IMPRESSION: No active lung disease.  Stable scarring in the lingula with no change in hyperaeration.  Original Report Authenticated By: Juline Patch, M.D.    EKG:21-Jan-2012 06:47:31 Sinus rhythm with occasional Premature ventricular complexes , new Possible Left atrial enlargement Left anterior fascicular block Inferior infarct , age undetermined Anterior infarct , age undetermined Abnormal ECG Vent. rate 70 BPM PR interval 154 ms QRS duration 106 ms QT/QTc 458/494 ms P-R-T axes 67 -52 24  Echo: 01/15/2012 Study Conclusions - Left ventricle: Distal septal and apical akinesis The cavity size was mildly dilated. There was mild concentric hypertrophy. Systolic function was mildly reduced. The estimated  ejection fraction was in the range of 45% to 50%. - Atrial septum: There was increased thickness of the septum, consistent with lipomatous hypertrophy.    FOLLOW UP PLANS AND APPOINTMENTS Discharge Orders    Future Appointments: Provider: Department: Dept Phone: Center:    within 2 weeks L. Tyrone Sage or MD  the office will call [phone] None   03/24/2012 11:00 AM Cassell Clement, MD [facility] [phone] None     Allergies  Allergen Reactions  . Indocin     unknown  . Lipitor (Atorvastatin Calcium)     Leg pain  . Pravachol     Leg pain  . Procardia (Nifedipine)   . Zaroxolyn (Metolazone)    Medication List  As of 01/22/2012  1:35 PM   STOP taking these medications         warfarin 5 MG tablet         TAKE these medications         albuterol 108 (90 BASE) MCG/ACT inhaler   Commonly known as: PROVENTIL HFA;VENTOLIN HFA   Inhale 2 puffs into the lungs every 6 (six) hours as  needed for wheezing.      ALPRAZolam 0.25 MG tablet   Commonly known as: XANAX   Take 1 tablet (0.25 mg total) by mouth every 6 (six) hours as needed for anxiety.      aspirin 81 MG tablet   Take 81 mg by mouth daily.      calcium carbonate 200 MG capsule   Take 500 mg by mouth daily.      calcium carbonate 1250 MG tablet   Commonly known as: OS-CAL - dosed in mg of elemental calcium   Take 1 tablet by mouth daily.      clopidogrel 75 MG tablet   Commonly known as: PLAVIX   Take 1 tablet (75 mg total) by mouth daily with breakfast.      fish oil-omega-3 fatty acids 1000 MG capsule   Take 2 g by mouth daily.      furosemide 40 MG tablet   Commonly known as: LASIX   Take 60 mg by mouth daily.      guaiFENesin 600 MG 12 hr tablet   Commonly known as: MUCINEX   Take 1,200 mg by mouth 2 (two) times daily as needed.      HYDROcodone-acetaminophen 5-500 MG per tablet   Commonly known as: VICODIN   Take 1 tablet by mouth every 6 (six) hours as needed.      isosorbide mononitrate 60  MG 24 hr tablet   Commonly known as: IMDUR   Take 1 tablet (60 mg total) by mouth 2 (two) times daily.      levalbuterol 45 MCG/ACT inhaler   Commonly known as: XOPENEX HFA   Inhale 1-2 puffs into the lungs every 4 (four) hours as needed. For shortness of breath      meclizine 25 MG tablet   Commonly known as: ANTIVERT   Take 25 mg by mouth 3 (three) times daily as needed. For dizziness      metoprolol 50 MG tablet   Commonly known as: LOPRESSOR   Take 50 mg by mouth 2 (two) times daily.      nitroGLYCERIN 0.4 MG SL tablet   Commonly known as: NITROSTAT   Place 0.4 mg under the tongue every 5 (five) minutes as needed.      potassium chloride SA 20 MEQ tablet   Commonly known as: K-DUR,KLOR-CON   Take 20 mEq by mouth daily.      ranolazine 500 MG 12 hr tablet   Commonly known as: RANEXA   Take 1 tablet (500 mg total) by mouth 2 (two) times daily.      rosuvastatin 10 MG tablet   Commonly known as: CRESTOR   Take 10 mg by mouth daily.      sodium bicarbonate 650 MG tablet   Take 650 mg by mouth daily.      SPIRIVA HANDIHALER 18 MCG inhalation capsule   Generic drug: tiotropium   Place 1 capsule into inhaler and inhale Daily.      temazepam 30 MG capsule   Commonly known as: RESTORIL   Take 30 mg by mouth at bedtime as needed. For sleep      theophylline 300 MG 12 hr tablet   Commonly known as: THEODUR   Take 1 tablet (300 mg total) by mouth daily.      valsartan-hydrochlorothiazide 160-25 MG per tablet   Commonly known as: DIOVAN-HCT   Take 1 tablet by mouth daily.           Follow-up Information  Follow up with Ralene Ok, MD .         BRING ALL MEDICATIONS WITH YOU TO FOLLOW UP APPOINTMENTS  Time spent with patient to include physician time: 45 min Signed: Theodore Demark 01/22/2012, 12:48 PM Co-Sign MD

## 2012-01-22 NOTE — Progress Notes (Signed)
CARDIAC REHAB PHASE I   PRE:  Rate/Rhythm: 80 SR  BP:  Supine:   Sitting: 128/64  Standing:    SaO2: 93 RA  MODE:  Ambulation: 224 ft   POST:  Rate/Rhythem: 80  BP:  Supine:   Sitting: 135/71  Standing:    SaO2: 94 RA 1110-1135 Assisted X 1 to ambulate with hand held assist. Gait steady,wlked 224 ft without c/o of cp. Some DOE and congestion noted with walking, but pt without c/o. RA sat after walk 94%. Back to side of bed with call light in reach.  Sean Figueroa

## 2012-01-23 NOTE — Discharge Summary (Signed)
Agree as outlined. See my progress note this same date. 

## 2012-01-28 ENCOUNTER — Other Ambulatory Visit: Payer: Self-pay

## 2012-01-28 DIAGNOSIS — F419 Anxiety disorder, unspecified: Secondary | ICD-10-CM

## 2012-01-28 MED ORDER — ALPRAZOLAM 0.25 MG PO TABS: 0.2500 mg | ORAL_TABLET | Freq: Four times a day (QID) | ORAL | Status: DC | PRN

## 2012-01-29 ENCOUNTER — Other Ambulatory Visit: Payer: Self-pay | Admitting: *Deleted

## 2012-01-29 MED ORDER — RANOLAZINE ER 500 MG PO TB12: 500.0000 mg | ORAL_TABLET | Freq: Two times a day (BID) | ORAL | Status: DC

## 2012-01-29 NOTE — Telephone Encounter (Signed)
Refilled ranexa 

## 2012-02-03 ENCOUNTER — Ambulatory Visit (INDEPENDENT_AMBULATORY_CARE_PROVIDER_SITE_OTHER): Payer: Medicare Other | Admitting: Nurse Practitioner

## 2012-02-03 ENCOUNTER — Encounter: Payer: Self-pay | Admitting: Nurse Practitioner

## 2012-02-03 ENCOUNTER — Ambulatory Visit: Payer: Self-pay | Admitting: Internal Medicine

## 2012-02-03 VITALS — BP 130/68 | HR 58 | Ht 68.0 in | Wt 182.0 lb

## 2012-02-03 DIAGNOSIS — I5023 Acute on chronic systolic (congestive) heart failure: Secondary | ICD-10-CM

## 2012-02-03 DIAGNOSIS — I252 Old myocardial infarction: Secondary | ICD-10-CM

## 2012-02-03 DIAGNOSIS — F17200 Nicotine dependence, unspecified, uncomplicated: Secondary | ICD-10-CM

## 2012-02-03 DIAGNOSIS — Z720 Tobacco use: Secondary | ICD-10-CM

## 2012-02-03 DIAGNOSIS — I2510 Atherosclerotic heart disease of native coronary artery without angina pectoris: Secondary | ICD-10-CM

## 2012-02-03 LAB — CBC WITH DIFFERENTIAL/PLATELET
Basophils Absolute: 0 10*3/uL (ref 0.0–0.1)
Basophils Relative: 0.4 % (ref 0.0–3.0)
Eosinophils Absolute: 0.4 10*3/uL (ref 0.0–0.7)
Eosinophils Relative: 4.6 % (ref 0.0–5.0)
HCT: 37.5 % — ABNORMAL LOW (ref 39.0–52.0)
Hemoglobin: 12.5 g/dL — ABNORMAL LOW (ref 13.0–17.0)
Lymphocytes Relative: 13.9 % (ref 12.0–46.0)
Lymphs Abs: 1.2 10*3/uL (ref 0.7–4.0)
MCHC: 33.2 g/dL (ref 30.0–36.0)
MCV: 104.2 fl — ABNORMAL HIGH (ref 78.0–100.0)
Monocytes Absolute: 0.8 10*3/uL (ref 0.1–1.0)
Monocytes Relative: 8.7 % (ref 3.0–12.0)
Neutro Abs: 6.5 10*3/uL (ref 1.4–7.7)
Neutrophils Relative %: 72.4 % (ref 43.0–77.0)
Platelets: 358 10*3/uL (ref 150.0–400.0)
RBC: 3.6 Mil/uL — ABNORMAL LOW (ref 4.22–5.81)
RDW: 15.3 % — ABNORMAL HIGH (ref 11.5–14.6)
WBC: 9 10*3/uL (ref 4.5–10.5)

## 2012-02-03 LAB — BASIC METABOLIC PANEL
BUN: 17 mg/dL (ref 6–23)
CO2: 30 mEq/L (ref 19–32)
Calcium: 8.7 mg/dL (ref 8.4–10.5)
Chloride: 101 mEq/L (ref 96–112)
Creatinine, Ser: 1.2 mg/dL (ref 0.4–1.5)
GFR: 64.3 mL/min (ref >60.00)
Glucose, Bld: 104 mg/dL — ABNORMAL HIGH (ref 70–99)
Potassium: 4.3 mEq/L (ref 3.5–5.1)
Sodium: 136 mEq/L (ref 135–145)

## 2012-02-03 NOTE — Assessment & Plan Note (Signed)
EF is 35%. Salt restriction is encouraged.

## 2012-02-03 NOTE — Assessment & Plan Note (Signed)
Smoking cessation is encouraged 

## 2012-02-03 NOTE — Progress Notes (Signed)
Sean Figueroa Date of Birth: Mar 12, 1933 Medical Record #[id number]  History of Present Illness: Sean Figueroa is seen back today for a post hospital visit. He is seen for Dr. Patty Sermons. He is a 78 year old male with multiple medical issues which include CAD, atrial fib, COPD, HLD, ongoing tobacco abuse and chronic systolic heart failure. He has had a prior LV apical aneurysm on past Coumadin anticoagulation. He has had a chronic history of chest pain. He presented to the hospital last month with progressive chest pain. He was using more NTG. In the past, he had not wanted to pursue cath due to his first experience in 1981. However, he agreed and had repeat cath. This showed significant disease. TCTS consult was called but was turned down. They did not think he would survive. He then underwent high risk DES to the LCX and DES to the left main with support of the Impella device per Dr. Excell Seltzer. His coumadin was stopped. He is now just on Plavix and aspirin. His EF was 35%.  He comes in today. He says he is doing great. No further chest pain. Not short of breath. Some bruising. Still smoking but down to a pack a week. He is tolerating his medicines. Has been drinking more V8 juice and he was cautioned. He is quite happy with how much better he feels.   Current Outpatient Prescriptions on File Prior to Visit  Medication Sig Dispense Refill  . albuterol (PROAIR HFA) 108 (90 BASE) MCG/ACT inhaler Inhale 2 puffs into the lungs every 6 (six) hours as needed for wheezing.  1 Inhaler  5  . ALPRAZolam (XANAX) 0.25 MG tablet Take 1 tablet (0.25 mg total) by mouth every 6 (six) hours as needed for anxiety.  30 tablet  5  . aspirin 81 MG tablet Take 81 mg by mouth daily.        . calcium carbonate (OS-CAL - DOSED IN MG OF ELEMENTAL CALCIUM) 1250 MG tablet Take 1 tablet by mouth daily.      . calcium carbonate 200 MG capsule Take 500 mg by mouth daily.        . clopidogrel (PLAVIX) 75 MG tablet Take 1 tablet (75 mg total)  by mouth daily with breakfast.  30 tablet  12  . fish oil-omega-3 fatty acids 1000 MG capsule Take 2 g by mouth daily.        . furosemide (LASIX) 40 MG tablet Take 60 mg by mouth daily.       Marland Kitchen guaiFENesin (MUCINEX) 600 MG 12 hr tablet Take 1,200 mg by mouth 2 (two) times daily as needed.        Marland Kitchen HYDROcodone-acetaminophen (VICODIN) 5-500 MG per tablet Take 1 tablet by mouth every 6 (six) hours as needed.        . isosorbide mononitrate (IMDUR) 60 MG 24 hr tablet Take 1 tablet (60 mg total) by mouth 2 (two) times daily.  60 tablet  11  . levalbuterol (XOPENEX HFA) 45 MCG/ACT inhaler Inhale 1-2 puffs into the lungs every 4 (four) hours as needed. For shortness of breath      . meclizine (ANTIVERT) 25 MG tablet Take 25 mg by mouth 3 (three) times daily as needed. For dizziness      . metoprolol (LOPRESSOR) 50 MG tablet Take 50 mg by mouth 2 (two) times daily.      . nitroGLYCERIN (NITROSTAT) 0.4 MG SL tablet Place 0.4 mg under the tongue every 5 (five) minutes as needed.        Marland Kitchen  potassium chloride SA (K-DUR,KLOR-CON) 20 MEQ tablet Take 20 mEq by mouth daily.        . ranolazine (RANEXA) 500 MG 12 hr tablet Take 1 tablet (500 mg total) by mouth 2 (two) times daily.  60 tablet  11  . rosuvastatin (CRESTOR) 10 MG tablet Take 10 mg by mouth daily.      . sodium bicarbonate 650 MG tablet Take 650 mg by mouth daily.       Marland Kitchen SPIRIVA HANDIHALER 18 MCG inhalation capsule Place 1 capsule into inhaler and inhale Daily.      . temazepam (RESTORIL) 30 MG capsule Take 30 mg by mouth at bedtime as needed. For sleep      . valsartan-hydrochlorothiazide (DIOVAN-HCT) 160-25 MG per tablet Take 1 tablet by mouth daily.         Allergies  Allergen Reactions  . Indocin     unknown  . Lipitor (Atorvastatin Calcium)     Leg pain  . Pravachol     Leg pain  . Procardia (Nifedipine)   . Zaroxolyn (Metolazone)     Past Medical History  Diagnosis Date  . Coronary artery disease     MI 1981 with CPR (reportedly  not requiring CABG) with left ventricular apical aneurysm for which he has been on Coumadin. Has chronic angina  . COPD (chronic obstructive pulmonary disease)     Uses 2.5L O2 nightly  . Hyperlipidemia   . Hypertension   . Long-term (current) use of anticoagulants     For hx of LV apical aneurysm  . LVH (left ventricular hypertrophy)   . Atrial fibrillation     Unclear history    Past Surgical History  Procedure Date  . Coronary angioplasty   . Cholecystectomy   . Hernia repair     History  Smoking status  . Current Some Day Smoker -- 0.5 packs/day  . Types: Cigarettes  Smokeless tobacco  . Not on file    History  Alcohol Use No    Family History  Problem Relation Age of Onset  . Heart disease Mother   . Cerebral palsy Daughter     Review of Systems: The review of systems is positive for some bruising.  All other systems were reviewed and are negative.  Physical Exam: BP 122/70  Pulse 58  Ht 5\' 8"  (1.727 m)  Wt 182 lb (82.555 kg)  BMI 27.67 kg/m2 Patient is very pleasant and in no acute distress. Skin is warm and dry. Color is normal.  HEENT is unremarkable. Normocephalic/atraumatic. PERRL. Sclera are nonicteric. Neck is supple. No masses. No JVD. Lungs are coarse with scattered ronchi. Cardiac exam shows a regular rate and rhythm. Abdomen is soft. Extremities are without edema. Gait and ROM are intact. No gross neurologic deficits noted.   LABORATORY DATA: Lab Results  Component Value Date   WBC 10.9* 01/21/2012   HGB 14.2 01/21/2012   HCT 41.0 01/21/2012   PLT 179 01/21/2012   GLUCOSE 112* 01/21/2012   CHOL 111 01/15/2012   TRIG 98 01/15/2012   HDL 42 01/15/2012   LDLCALC 49 01/15/2012   ALT 11 02/27/2011   AST 17 02/27/2011   NA 135 01/21/2012   K 3.7 01/21/2012   CL 95* 01/21/2012   CREATININE 1.26 01/21/2012   BUN 25* 01/21/2012   CO2 34* 01/21/2012   INR 1.54* 01/15/2012   HGBA1C 5.8* 01/14/2012    Assessment / Plan:

## 2012-02-03 NOTE — Patient Instructions (Signed)
Try to get off of your cigarettes.  We are going to check some labs today.  We will see you back in May with Dr. Patty Sermons.  Limit your salt use.  Call the [facility] office at [phone] if you have any questions, problems or concerns.

## 2012-02-03 NOTE — Assessment & Plan Note (Signed)
Patient presents with recent DES to the LCX and DES to the left main with the support of the Impella device. He is doing very well clinically. Now just on aspirin and Plavix for the next 12 months. No longer on his coumadin. We are rechecking labs today. Activity will be advanced as tolerated. Smoking cessation is encouraged. He is doing much better clinically. We will see him back at his regular appointment time. Patient is agreeable to this plan and will call if any problems develop in the interim.

## 2012-02-05 ENCOUNTER — Telehealth: Payer: Self-pay | Admitting: Cardiology

## 2012-02-05 DIAGNOSIS — Z79899 Other long term (current) drug therapy: Secondary | ICD-10-CM

## 2012-02-05 NOTE — Telephone Encounter (Signed)
Advised patient and will recheck CBC at 4/1 INR check

## 2012-02-05 NOTE — Telephone Encounter (Signed)
Message copied by Burnell Blanks on Wed Feb 05, 2012  4:00 PM ------      Message from: Rosalio Macadamia      Created: Mon Feb 03, 2012 12:58 PM       Ok to report. Labs are satisfactory.

## 2012-02-05 NOTE — Telephone Encounter (Signed)
New Problem:    Patient called because someone called him and he believes that it was in regards to his blood work.  Please call back.

## 2012-02-17 ENCOUNTER — Encounter (INDEPENDENT_AMBULATORY_CARE_PROVIDER_SITE_OTHER): Payer: Medicare Other

## 2012-02-17 ENCOUNTER — Ambulatory Visit (INDEPENDENT_AMBULATORY_CARE_PROVIDER_SITE_OTHER): Payer: Medicare Other | Admitting: *Deleted

## 2012-02-17 DIAGNOSIS — I4891 Unspecified atrial fibrillation: Secondary | ICD-10-CM

## 2012-02-17 DIAGNOSIS — E7800 Pure hypercholesterolemia, unspecified: Secondary | ICD-10-CM

## 2012-02-17 DIAGNOSIS — I200 Unstable angina: Secondary | ICD-10-CM

## 2012-02-17 LAB — CBC WITH DIFFERENTIAL/PLATELET
Basophils Absolute: 0 10*3/uL (ref 0.0–0.1)
Eosinophils Absolute: 0.4 10*3/uL (ref 0.0–0.7)
Lymphocytes Relative: 20.2 % (ref 12.0–46.0)
MCHC: 32.7 g/dL (ref 30.0–36.0)
Neutrophils Relative %: 62 % (ref 43.0–77.0)
RDW: 16.2 % — ABNORMAL HIGH (ref 11.5–14.6)

## 2012-02-17 NOTE — Progress Notes (Signed)
Quick Note:  Please report to patient. The recent labs are stable. Continue same medication and careful diet. ______ 

## 2012-02-19 ENCOUNTER — Telehealth: Payer: Self-pay | Admitting: *Deleted

## 2012-02-19 NOTE — Telephone Encounter (Signed)
Message copied by Burnell Blanks on Wed Feb 19, 2012  4:03 PM ------      Message from: Cassell Clement      Created: Mon Feb 17, 2012  9:25 PM       Please report to patient.  The recent labs are stable. Continue same medication and careful diet.

## 2012-02-19 NOTE — Telephone Encounter (Signed)
Advised of labs 

## 2012-02-20 ENCOUNTER — Other Ambulatory Visit: Payer: Self-pay | Admitting: Internal Medicine

## 2012-02-20 DIAGNOSIS — R609 Edema, unspecified: Secondary | ICD-10-CM

## 2012-02-21 ENCOUNTER — Ambulatory Visit
Admission: RE | Admit: 2012-02-21 | Discharge: 2012-02-21 | Disposition: A | Discharge status: Home / Self Care | Payer: Medicare Other | Source: Ambulatory Visit | Attending: Internal Medicine | Admitting: Internal Medicine

## 2012-02-21 DIAGNOSIS — R609 Edema, unspecified: Secondary | ICD-10-CM

## 2012-03-02 NOTE — Telephone Encounter (Signed)
Encounter completed.

## 2012-03-09 ENCOUNTER — Other Ambulatory Visit: Payer: Self-pay | Admitting: Cardiology

## 2012-03-09 MED ORDER — THEOPHYLLINE ER 300 MG PO CP24: 300.0000 mg | ORAL_CAPSULE | Freq: Every day | ORAL | Status: AC

## 2012-03-24 ENCOUNTER — Encounter: Payer: Self-pay | Admitting: Cardiology

## 2012-03-24 ENCOUNTER — Ambulatory Visit (INDEPENDENT_AMBULATORY_CARE_PROVIDER_SITE_OTHER): Payer: Medicare Other | Admitting: Cardiology

## 2012-03-24 VITALS — BP 101/63 | HR 58 | Ht 68.0 in | Wt 174.0 lb

## 2012-03-24 DIAGNOSIS — I4891 Unspecified atrial fibrillation: Secondary | ICD-10-CM

## 2012-03-24 DIAGNOSIS — J449 Chronic obstructive pulmonary disease, unspecified: Secondary | ICD-10-CM

## 2012-03-24 DIAGNOSIS — F411 Generalized anxiety disorder: Secondary | ICD-10-CM

## 2012-03-24 DIAGNOSIS — E7800 Pure hypercholesterolemia, unspecified: Secondary | ICD-10-CM

## 2012-03-24 DIAGNOSIS — I119 Hypertensive heart disease without heart failure: Secondary | ICD-10-CM

## 2012-03-24 DIAGNOSIS — Z720 Tobacco use: Secondary | ICD-10-CM

## 2012-03-24 DIAGNOSIS — G4700 Insomnia, unspecified: Secondary | ICD-10-CM

## 2012-03-24 DIAGNOSIS — I259 Chronic ischemic heart disease, unspecified: Secondary | ICD-10-CM

## 2012-03-24 DIAGNOSIS — F419 Anxiety disorder, unspecified: Secondary | ICD-10-CM

## 2012-03-24 DIAGNOSIS — I480 Paroxysmal atrial fibrillation: Secondary | ICD-10-CM

## 2012-03-24 DIAGNOSIS — I2510 Atherosclerotic heart disease of native coronary artery without angina pectoris: Secondary | ICD-10-CM

## 2012-03-24 MED ORDER — ISOSORBIDE MONONITRATE ER 60 MG PO TB24: 60.0000 mg | ORAL_TABLET | Freq: Two times a day (BID) | ORAL | Status: DC

## 2012-03-24 MED ORDER — FUROSEMIDE 40 MG PO TABS: 60.0000 mg | ORAL_TABLET | Freq: Every day | ORAL | Status: DC

## 2012-03-24 MED ORDER — ALPRAZOLAM 0.25 MG PO TABS: 0.2500 mg | ORAL_TABLET | Freq: Two times a day (BID) | ORAL | Status: DC | PRN

## 2012-03-24 MED ORDER — ALBUTEROL SULFATE HFA 108 (90 BASE) MCG/ACT IN AERS: 2.0000 | INHALATION_SPRAY | Freq: Four times a day (QID) | RESPIRATORY_TRACT | Status: DC | PRN

## 2012-03-24 MED ORDER — CLOPIDOGREL BISULFATE 75 MG PO TABS: 75.0000 mg | ORAL_TABLET | Freq: Every day | ORAL | Status: AC

## 2012-03-24 MED ORDER — ROSUVASTATIN CALCIUM 10 MG PO TABS: 10.0000 mg | ORAL_TABLET | Freq: Every day | ORAL | Status: DC

## 2012-03-24 MED ORDER — NITROGLYCERIN 0.4 MG SL SUBL: 0.4000 mg | SUBLINGUAL_TABLET | SUBLINGUAL | Status: DC | PRN

## 2012-03-24 MED ORDER — RANOLAZINE ER 500 MG PO TB12: 500.0000 mg | ORAL_TABLET | Freq: Two times a day (BID) | ORAL | Status: DC

## 2012-03-24 MED ORDER — POTASSIUM CHLORIDE CRYS ER 20 MEQ PO TBCR: 20.0000 meq | EXTENDED_RELEASE_TABLET | Freq: Every day | ORAL | Status: DC

## 2012-03-24 MED ORDER — TIOTROPIUM BROMIDE MONOHYDRATE 18 MCG IN CAPS: 1.0000 | ORAL_CAPSULE | Freq: Every day | RESPIRATORY_TRACT | Status: DC

## 2012-03-24 MED ORDER — VALSARTAN-HYDROCHLOROTHIAZIDE 160-25 MG PO TABS: 1.0000 | ORAL_TABLET | Freq: Every day | ORAL | Status: DC

## 2012-03-24 MED ORDER — METOPROLOL TARTRATE 50 MG PO TABS: 50.0000 mg | ORAL_TABLET | Freq: Two times a day (BID) | ORAL | Status: DC

## 2012-03-24 MED ORDER — TEMAZEPAM 30 MG PO CAPS: 30.0000 mg | ORAL_CAPSULE | Freq: Every evening | ORAL | Status: DC | PRN

## 2012-03-24 NOTE — Assessment & Plan Note (Signed)
The patient has significant COPD.  He has not been as short of breath as he was prior to his cardiac catheterization.  He is now able to do some yard work which he was not able to do last year denies any purulent sputum or significant cough

## 2012-03-24 NOTE — Assessment & Plan Note (Signed)
The patient has not been experiencing any recurrent chest pain or angina.  He has not had to take any sublingual nitroglycerin.  He is very pleased with the outcome from the stents

## 2012-03-24 NOTE — Assessment & Plan Note (Signed)
The patient is still smoking.  He states that a pack of cigarettes now lasting a full week.  He intends to quit.  Talk to him at length about the importance of quitting altogether.

## 2012-03-24 NOTE — Patient Instructions (Signed)
Smoking Cessation This document explains the best ways for you to quit smoking and new treatments to help. It lists new medicines that can double or triple your chances of quitting and quitting for good. It also considers ways to avoid relapses and concerns you may have about quitting, including weight gain. NICOTINE: A POWERFUL ADDICTION If you have tried to quit smoking, you know how hard it can be. It is hard because nicotine is a very addictive drug. For some people, it can be as addictive as heroin or cocaine. Usually, people make 2 or 3 tries, or more, before finally being able to quit. Each time you try to quit, you can learn about what helps and what hurts. Quitting takes hard work and a lot of effort, but you can quit smoking. QUITTING SMOKING IS ONE OF THE MOST IMPORTANT THINGS YOU WILL EVER DO.  You will live longer, feel better, and live better.   The impact on your body of quitting smoking is felt almost immediately:   Within 20 minutes, blood pressure decreases. Pulse returns to its normal level.   After 8 hours, carbon monoxide levels in the blood return to normal. Oxygen level increases.   After 24 hours, chance of heart attack starts to decrease. Breath, hair, and body stop smelling like smoke.   After 48 hours, damaged nerve endings begin to recover. Sense of taste and smell improve.   After 72 hours, the body is virtually free of nicotine. Bronchial tubes relax and breathing becomes easier.   After 2 to 12 weeks, lungs can hold more air. Exercise becomes easier and circulation improves.   Quitting will reduce your risk of having a heart attack, stroke, cancer, or lung disease:   After 1 year, the risk of coronary heart disease is cut in half.   After 5 years, the risk of stroke falls to the same as a nonsmoker.   After 10 years, the risk of lung cancer is cut in half and the risk of other cancers decreases significantly.   After 15 years, the risk of coronary heart  disease drops, usually to the level of a nonsmoker.   If you are pregnant, quitting smoking will improve your chances of having a healthy baby.   The people you live with, especially your children, will be healthier.   You will have extra money to spend on things other than cigarettes.  FIVE KEYS TO QUITTING Studies have shown that these 5 steps will help you quit smoking and quit for good. You have the best chances of quitting if you use them together: 1. Get ready.  2. Get support and encouragement.  3. Learn new skills and behaviors.  4. Get medicine to reduce your nicotine addiction and use it correctly.  5. Be prepared for relapse or difficult situations. Be determined to continue trying to quit, even if you do not succeed at first.  1. GET READY  Set a quit date.   Change your environment.   Get rid of ALL cigarettes, ashtrays, matches, and lighters in your home, car, and place of work.   Do not let people smoke in your home.   Review your past attempts to quit. Think about what worked and what did not.   Once you quit, do not smoke. NOT EVEN A PUFF!  2. GET SUPPORT AND ENCOURAGEMENT Studies have shown that you have a better chance of being successful if you have help. You can get support in many ways.  Tell   your family, friends, and coworkers that you are going to quit and need their support. Ask them not to smoke around you.   Talk to your caregivers (doctor, dentist, nurse, pharmacist, psychologist, and/or smoking counselor).   Get individual, group, or telephone counseling and support. The more counseling you have, the better your chances are of quitting. Programs are available at local hospitals and health centers. Call your local health department for information about programs in your area.   Spiritual beliefs and practices may help some smokers quit.   Quit meters are small computer programs online or downloadable that keep track of quit statistics, such as amount  of "quit-time," cigarettes not smoked, and money saved.   Many smokers find one or more of the many self-help books available useful in helping them quit and stay off tobacco.  3. LEARN NEW SKILLS AND BEHAVIORS  Try to distract yourself from urges to smoke. Talk to someone, go for a walk, or occupy your time with a task.   When you first try to quit, change your routine. Take a different route to work. Drink tea instead of coffee. Eat breakfast in a different place.   Do something to reduce your stress. Take a hot bath, exercise, or read a book.   Plan something enjoyable to do every day. Reward yourself for not smoking.   Explore interactive web-based programs that specialize in helping you quit.  4. GET MEDICINE AND USE IT CORRECTLY Medicines can help you stop smoking and decrease the urge to smoke. Combining medicine with the above behavioral methods and support can quadruple your chances of successfully quitting smoking. The U.S. Food and Drug Administration (FDA) has approved 7 medicines to help you quit smoking. These medicines fall into 3 categories.  Nicotine replacement therapy (delivers nicotine to your body without the negative effects and risks of smoking):   Nicotine gum: Available over-the-counter.   Nicotine lozenges: Available over-the-counter.   Nicotine inhaler: Available by prescription.   Nicotine nasal spray: Available by prescription.   Nicotine skin patches (transdermal): Available by prescription and over-the-counter.   Antidepressant medicine (helps people abstain from smoking, but how this works is unknown):   Bupropion sustained-release (SR) tablets: Available by prescription.   Nicotinic receptor partial agonist (simulates the effect of nicotine in your brain):   Varenicline tartrate tablets: Available by prescription.   Ask your caregiver for advice about which medicines to use and how to use them. Carefully read the information on the package.    Everyone who is trying to quit may benefit from using a medicine. If you are pregnant or trying to become pregnant, nursing an infant, you are under age 18, or you smoke fewer than 10 cigarettes per day, talk to your caregiver before taking any nicotine replacement medicines.   You should stop using a nicotine replacement product and call your caregiver if you experience nausea, dizziness, weakness, vomiting, fast or irregular heartbeat, mouth problems with the lozenge or gum, or redness or swelling of the skin around the patch that does not go away.   Do not use any other product containing nicotine while using a nicotine replacement product.   Talk to your caregiver before using these products if you have diabetes, heart disease, asthma, stomach ulcers, you had a recent heart attack, you have high blood pressure that is not controlled with medicine, a history of irregular heartbeat, or you have been prescribed medicine to help you quit smoking.  5. BE PREPARED FOR RELAPSE OR   DIFFICULT SITUATIONS  Most relapses occur within the first 3 months after quitting. Do not be discouraged if you start smoking again. Remember, most people try several times before they finally quit.   You may have symptoms of withdrawal because your body is used to nicotine. You may crave cigarettes, be irritable, feel very hungry, cough often, get headaches, or have difficulty concentrating.   The withdrawal symptoms are only temporary. They are strongest when you first quit, but they will go away within 10 to 14 days.  Here are some difficult situations to watch for:  Alcohol. Avoid drinking alcohol. Drinking lowers your chances of successfully quitting.   Caffeine. Try to reduce the amount of caffeine you consume. It also lowers your chances of successfully quitting.   Other smokers. Being around smoking can make you want to smoke. Avoid smokers.   Weight gain. Many smokers will gain weight when they quit, usually  less than 10 pounds. Eat a healthy diet and stay active. Do not let weight gain distract you from your main goal, quitting smoking. Some medicines that help you quit smoking may also help delay weight gain. You can always lose the weight gained after you quit.   Bad mood or depression. There are a lot of ways to improve your mood other than smoking.  If you are having problems with any of these situations, talk to your caregiver. SPECIAL SITUATIONS AND CONDITIONS Studies suggest that everyone can quit smoking. Your situation or condition can give you a special reason to quit.  Pregnant women/new mothers: By quitting, you protect your baby's health and your own.   Hospitalized patients: By quitting, you reduce health problems and help healing.   Heart attack patients: By quitting, you reduce your risk of a second heart attack.   Lung, head, and neck cancer patients: By quitting, you reduce your chance of a second cancer.   Parents of children and adolescents: By quitting, you protect your children from illnesses caused by secondhand smoke.  QUESTIONS TO THINK ABOUT Think about the following questions before you try to stop smoking. You may want to talk about your answers with your caregiver.  Why do you want to quit?   If you tried to quit in the past, what helped and what did not?   What will be the most difficult situations for you after you quit? How will you plan to handle them?   Who can help you through the tough times? Your family? Friends? Caregiver?   What pleasures do you get from smoking? What ways can you still get pleasure if you quit?  Here are some questions to ask your caregiver:  How can you help me to be successful at quitting?   What medicine do you think would be best for me and how should I take it?   What should I do if I need more help?   What is smoking withdrawal like? How can I get information on withdrawal?  Quitting takes hard work and a lot of effort,  but you can quit smoking. FOR MORE INFORMATION  Smokefree.gov (http://www.davis-sullivan.com/) provides free, accurate, evidence-based information and professional assistance to help support the immediate and long-term needs of people trying to quit smoking. Document Released: 10/29/2001 Document Revised: 10/24/2011 Document Reviewed: 08/21/2009 [facility] Patient Information 2012 [place], [place].  Work harder on stopping smoking  Your physician recommends that you continue on your current medications as directed. Please refer to the Current Medication list given to you today.  Your physician recommends that you schedule a follow-up appointment in: 3 months with fasting labs (LP/BMET/HFP/CBC) AND EKG

## 2012-03-24 NOTE — Progress Notes (Signed)
Enzo Montgomery Date of Birth:  [birth year] [facility] [address] [place], [place]  [address] [phone]         Fax   [phone]  History of Present Illness: This pleasant 78 year old gentleman is seen for a scheduled followup office visit.  He has a history of known ischemic heart disease.  He presented to the hospital in March with crescendo angina and underwent cardiac catheterization showed significant disease.  TCTS was consulted and it was felt to be too high-risk for CABG because of his significant pulmonary disease and his poor LV function with apical aneurysm.  He then underwent high risk drug-eluting stent to the circumflex and a drug-eluting stent to the left main with support of the Impella device by Dr. Excell Seltzer.  His Coumadin was stopped at that point and he was switched to long term dual antiplatelet therapy with Plavix and aspirin. Current Outpatient Prescriptions  Medication Sig Dispense Refill  . albuterol (PROAIR HFA) 108 (90 BASE) MCG/ACT inhaler Inhale 2 puffs into the lungs every 6 (six) hours as needed for wheezing.  1 Inhaler  5  . allopurinol (ZYLOPRIM) 100 MG tablet as directed.       Marland Kitchen ALPRAZolam (XANAX) 0.25 MG tablet Take 1 tablet (0.25 mg total) by mouth every 6 (six) hours as needed for anxiety.  30 tablet  5  . aspirin 81 MG tablet Take 81 mg by mouth daily.        . calcium carbonate (OS-CAL - DOSED IN MG OF ELEMENTAL CALCIUM) 1250 MG tablet Take 1 tablet by mouth daily.      . calcium carbonate 200 MG capsule Take 500 mg by mouth daily.        . clopidogrel (PLAVIX) 75 MG tablet Take 1 tablet (75 mg total) by mouth daily with breakfast.  30 tablet  12  . fish oil-omega-3 fatty acids 1000 MG capsule Take 2 g by mouth daily.        . furosemide (LASIX) 40 MG tablet Take 60 mg by mouth daily.       Marland Kitchen guaiFENesin (MUCINEX) 600 MG 12 hr tablet Take 1,200 mg by mouth 2 (two) times daily as needed.        Marland Kitchen HYDROcodone-acetaminophen (VICODIN)  5-500 MG per tablet Take 1 tablet by mouth every 6 (six) hours as needed.        . isosorbide mononitrate (IMDUR) 60 MG 24 hr tablet Take 1 tablet (60 mg total) by mouth 2 (two) times daily.  60 tablet  11  . levalbuterol (XOPENEX HFA) 45 MCG/ACT inhaler Inhale 1-2 puffs into the lungs every 4 (four) hours as needed. For shortness of breath      . meclizine (ANTIVERT) 25 MG tablet Take 25 mg by mouth 3 (three) times daily as needed. For dizziness      . metoprolol (LOPRESSOR) 50 MG tablet Take 50 mg by mouth 2 (two) times daily.      . nitroGLYCERIN (NITROSTAT) 0.4 MG SL tablet Place 0.4 mg under the tongue every 5 (five) minutes as needed.        . potassium chloride SA (K-DUR,KLOR-CON) 20 MEQ tablet Take 20 mEq by mouth daily.        . ranolazine (RANEXA) 500 MG 12 hr tablet Take 1 tablet (500 mg total) by mouth 2 (two) times daily.  60 tablet  11  . rosuvastatin (CRESTOR) 10 MG tablet Take 10 mg by mouth daily.      Marland Kitchen  sodium bicarbonate 650 MG tablet Take 650 mg by mouth daily.       . temazepam (RESTORIL) 30 MG capsule Take 30 mg by mouth at bedtime as needed. For sleep      . theophylline (THEO-24) 300 MG 24 hr capsule Take 1 capsule (300 mg total) by mouth daily.  30 capsule  11  . valsartan-hydrochlorothiazide (DIOVAN-HCT) 160-25 MG per tablet Take 1 tablet by mouth daily.       Marland Kitchen SPIRIVA HANDIHALER 18 MCG inhalation capsule Place 1 capsule into inhaler and inhale Daily.        Allergies  Allergen Reactions  . Indomethacin     unknown  . Lipitor (Atorvastatin Calcium)     Leg pain  . Pravachol     Leg pain  . Procardia (Nifedipine)   . Zaroxolyn (Metolazone)     Patient Active Problem List  Diagnoses  . Apical myocardial infarction greater than eight weeks ago  . COPD (chronic obstructive pulmonary disease)  . Tobacco abuse  . Hypercholesterolemia  . Osteoarthritis  . Intermediate coronary syndrome  . Hypokalemia  . Acute on chronic systolic heart failure  . CAD (coronary  artery disease)  . PAF (paroxysmal atrial fibrillation)    History  Smoking status  . Current Some Day Smoker -- 0.5 packs/day  . Types: Cigarettes  Smokeless tobacco  . Not on file    History  Alcohol Use No    Family History  Problem Relation Age of Onset  . Heart disease Mother   . Cerebral palsy Daughter     Review of Systems: Constitutional: no fever chills diaphoresis or fatigue or change in weight.  Head and neck: no hearing loss, no epistaxis, no photophobia or visual disturbance. Respiratory: No cough, shortness of breath or wheezing. Cardiovascular: No chest pain peripheral edema, palpitations. Gastrointestinal: No abdominal distention, no abdominal pain, no change in bowel habits hematochezia or melena. Genitourinary: No dysuria, no frequency, no urgency, no nocturia. Musculoskeletal:No arthralgias, no back pain, no gait disturbance or myalgias. Neurological: No dizziness, no headaches, no numbness, no seizures, no syncope, no weakness, no tremors. Hematologic: No lymphadenopathy, no easy bruising. Psychiatric: No confusion, no hallucinations, no sleep disturbance.    Physical Exam: Filed Vitals:   03/24/12 1116  BP: 101/63  Pulse: 58   general appearance reveals a well-developed well-nourished elderly gentleman in no distress.Pupils equal and reactive.   Extraocular Movements are full.  There is no scleral icterus.  The mouth and pharynx are normal.  The neck is supple.  The carotids reveal no bruits.  The jugular venous pressure is normal.  The thyroid is not enlarged.  There is no lymphadenopathy.  The chest reveals scattered expiratory wheezing.The precordium is quiet.  The first heart sound is normal.  The second heart sound is physiologically split.  There is no murmur gallop rub or click.  There is no abnormal lift or heave.  The abdomen is soft and nontender. Bowel sounds are normal. The liver and spleen are not enlarged. There Are no abdominal masses.  There are no bruits.  The pedal pulses are good.  There is no phlebitis or edema.  There is no cyanosis or clubbing. Strength is normal and symmetrical in all extremities.  There is no lateralizing weakness.  There are no sensory deficits.  The skin is warm and dry.  There is no rash.    Assessment / Plan:  Refill to all of his prescriptions today and made it for 90  days at his request.  It will be cheaper for him he states.  We'll see him in 3 months for followup office visit EKG CBC lipid panel hepatic function panel and basal metabolic panel.  He will work on quitting smoking.

## 2012-05-06 ENCOUNTER — Telehealth (HOSPITAL_COMMUNITY): Payer: Self-pay | Admitting: *Deleted

## 2012-05-06 NOTE — ED Notes (Signed)
Pt. called on VM and is c/o sorethroat and bil. jaw soreness. I called him back and he said he has a PCP Dr. Mariah Milling [place].  He called the office and they told him to call an Urgent Care.  I told him we could see him and gave him the hours and location. I explained that we were a walk in clinic- no appointment necessary.

## 2012-05-07 ENCOUNTER — Emergency Department (HOSPITAL_COMMUNITY)
Admission: EM | Admit: 2012-05-07 | Discharge: 2012-05-07 | Disposition: A | Discharge status: Home / Self Care | Payer: Medicare Other | Source: Home / Self Care | Attending: Family Medicine | Admitting: Family Medicine

## 2012-05-07 ENCOUNTER — Encounter (HOSPITAL_COMMUNITY): Payer: Self-pay | Admitting: *Deleted

## 2012-05-07 DIAGNOSIS — J029 Acute pharyngitis, unspecified: Secondary | ICD-10-CM

## 2012-05-07 MED ORDER — AMOXICILLIN 500 MG PO CAPS: 500.0000 mg | ORAL_CAPSULE | Freq: Three times a day (TID) | ORAL | Status: AC

## 2012-05-07 NOTE — ED Notes (Signed)
Pt is here with complaints of sore throat since yesterday with runny nose, chills and occasional cough.  Pt is HOH.

## 2012-05-07 NOTE — Discharge Instructions (Signed)
Throat spray of choice. If symptoms persist follow up with your pcp or ent. Pharyngitis, Viral and Bacterial Pharyngitis is soreness (inflammation) or infection of the pharynx. It is also called a sore throat. CAUSES  Most sore throats are caused by viruses and are part of a cold. However, some sore throats are caused by strep and other bacteria. Sore throats can also be caused by post nasal drip from draining sinuses, allergies and sometimes from sleeping with an open mouth. Infectious sore throats can be spread from person to person by coughing, sneezing and sharing cups or eating utensils. TREATMENT  Sore throats that are viral usually last 3-4 days. Viral illness will get better without medications (antibiotics). Strep throat and other bacterial infections will usually begin to get better about 24-48 hours after you begin to take antibiotics. HOME CARE INSTRUCTIONS   If the caregiver feels there is a bacterial infection or if there is a positive strep test, they will prescribe an antibiotic. The full course of antibiotics must be taken. If the full course of antibiotic is not taken, you or your child may become ill again. If you or your child has strep throat and do not finish all of the medication, serious heart or kidney diseases may develop.   Drink enough water and fluids to keep your urine clear or pale yellow.   Only take over-the-counter or prescription medicines for pain, discomfort or fever as directed by your caregiver.   Get lots of rest.   Gargle with salt water ( tsp. of salt in a glass of water) as often as every 1-2 hours as you need for comfort.   Hard candies may soothe the throat if individual is not at risk for choking. Throat sprays or lozenges may also be used.  SEEK MEDICAL CARE IF:   Large, tender lumps in the neck develop.   A rash develops.   Green, yellow-brown or bloody sputum is coughed up.   Your baby is older than 3 months with a rectal temperature of  100.5 F (38.1 C) or higher for more than 1 day.  SEEK IMMEDIATE MEDICAL CARE IF:   A stiff neck develops.   You or your child are drooling or unable to swallow liquids.   You or your child are vomiting, unable to keep medications or liquids down.   You or your child has severe pain, unrelieved with recommended medications.   You or your child are having difficulty breathing (not due to stuffy nose).   You or your child are unable to fully open your mouth.   You or your child develop redness, swelling, or severe pain anywhere on the neck.   You have a fever.   Your baby is older than 3 months with a rectal temperature of 102 F (38.9 C) or higher.   Your baby is 11 months old or younger with a rectal temperature of 100.4 F (38 C) or higher.  MAKE SURE YOU:   Understand these instructions.   Will watch your condition.   Will get help right away if you are not doing well or get worse.  Document Released: 11/04/2005 Document Revised: 10/24/2011 Document Reviewed: 02/01/2008 [facility] Patient Information 2012 [place], [place].

## 2012-05-07 NOTE — ED Provider Notes (Signed)
History     CSN: [id number]  Arrival date & time 05/07/12  1112   First MD Initiated Contact with Patient 05/07/12 1115      Chief Complaint  Patient presents with  . Sore Throat    (Consider location/radiation/quality/duration/timing/severity/associated sxs/prior treatment) HPI Comments: The patient reports having a sore throat x 2 days. No fever, no cough. Has had some runny nose. No treatment pta. No n/v/d/c. Hx of heavy smoking.   The history is provided by the patient.    Past Medical History  Diagnosis Date  . 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 Has chronic angina; S/P DES to the LCX and DES to the L MAIN 12/2011. Now on Plavix and ASA.   Marland Kitchen COPD (chronic obstructive pulmonary disease)     Uses 2.5L O2 nightly  . Hyperlipidemia   . Hypertension   . Long-term (current) use of anticoagulants     For hx of LV apical aneurysm  . LVH (left ventricular hypertrophy)   . Atrial fibrillation     Unclear history  . Tobacco abuse   . Systolic heart failure     EF is 35% per cath 12/2011    Past Surgical History  Procedure Date  . Coronary stent placement 12/2011    LCX and L main - drug eluting  . Cholecystectomy   . Hernia repair     Family History  Problem Relation Age of Onset  . Heart disease Mother   . Cerebral palsy Daughter     History  Substance Use Topics  . Smoking status: Current Some Day Smoker -- 0.5 packs/day    Types: Cigarettes  . Smokeless tobacco: Not on file  . Alcohol Use: No      Review of Systems  Constitutional: Positive for chills. Negative for fever and fatigue.  HENT: Positive for congestion, sore throat, rhinorrhea, trouble swallowing and postnasal drip. Negative for ear pain and neck pain.   Respiratory: Negative.   Gastrointestinal: Negative.   Genitourinary: Negative.   Musculoskeletal: Negative.   Skin: Negative.       Allergies  Indomethacin; Lipitor; Pravachol; Procardia; and Zaroxolyn  Home Medications   Current Outpatient Rx  Name Route Sig Dispense Refill  . ALBUTEROL SULFATE HFA 108 (90 BASE) MCG/ACT IN AERS Inhalation Inhale 2 puffs into the lungs every 6 (six) hours as needed for wheezing. 3 Inhaler 3  . ALLOPURINOL 100 MG PO TABS  as directed.     Marland Kitchen ALPRAZOLAM 0.25 MG PO TABS Oral Take 1 tablet (0.25 mg total) by mouth 2 (two) times daily as needed for anxiety. 180 tablet 1  . AMOXICILLIN 500 MG PO CAPS Oral Take 1 capsule (500 mg total) by mouth 3 (three) times daily. 30 capsule 0  . ASPIRIN 81 MG PO TABS Oral Take 81 mg by mouth daily.      Marland Kitchen CALCIUM CARBONATE 1250 MG PO TABS Oral Take 1 tablet by mouth daily.    Marland Kitchen CALCIUM CARBONATE 200 MG PO CAPS Oral Take 500 mg by mouth daily.      Marland Kitchen CLOPIDOGREL BISULFATE 75 MG PO TABS Oral Take 1 tablet (75 mg total) by mouth daily with breakfast. 90 tablet 3  . OMEGA-3 FATTY ACIDS 1000 MG PO CAPS Oral Take 2 g by mouth daily.      . FUROSEMIDE 40 MG PO TABS Oral Take 1.5  tablets (60 mg total) by mouth daily. 135 tablet 3  . GUAIFENESIN ER 600 MG PO TB12 Oral Take 1,200 mg by mouth 2 (two) times daily as needed.      Marland Kitchen HYDROCODONE-ACETAMINOPHEN 5-500 MG PO TABS Oral Take 1 tablet by mouth every 6 (six) hours as needed.      . ISOSORBIDE MONONITRATE ER 60 MG PO TB24 Oral Take 1 tablet (60 mg total) by mouth 2 (two) times daily. 180 tablet 3  . LEVALBUTEROL TARTRATE 45 MCG/ACT IN AERO Inhalation Inhale 1-2 puffs into the lungs every 4 (four) hours as needed. For shortness of breath    . MECLIZINE HCL 25 MG PO TABS Oral Take 25 mg by mouth 3 (three) times daily as needed. For dizziness    . METOPROLOL TARTRATE 50 MG PO TABS Oral Take 1 tablet (50 mg total) by mouth 2 (two) times daily. 180 tablet 3  . NITROGLYCERIN 0.4 MG SL SUBL Sublingual Place 1 tablet (0.4 mg total) under the tongue every 5 (five) minutes as needed. 100 tablet prn  . POTASSIUM CHLORIDE  CRYS ER 20 MEQ PO TBCR Oral Take 1 tablet (20 mEq total) by mouth daily. 90 tablet 3  . RANOLAZINE ER 500 MG PO TB12 Oral Take 1 tablet (500 mg total) by mouth 2 (two) times daily. 180 tablet 3  . ROSUVASTATIN CALCIUM 10 MG PO TABS Oral Take 1 tablet (10 mg total) by mouth daily. 90 tablet 3  . SODIUM BICARBONATE 650 MG PO TABS Oral Take 650 mg by mouth daily.     Marland Kitchen TEMAZEPAM 30 MG PO CAPS Oral Take 1 capsule (30 mg total) by mouth at bedtime as needed. For sleep 90 capsule 1  . THEOPHYLLINE ER 300 MG PO CP24 Oral Take 1 capsule (300 mg total) by mouth daily. 30 capsule 11  . TIOTROPIUM BROMIDE MONOHYDRATE 18 MCG IN CAPS Inhalation Place 1 capsule (18 mcg total) into inhaler and inhale daily. 90 capsule 3  . VALSARTAN-HYDROCHLOROTHIAZIDE 160-25 MG PO TABS Oral Take 1 tablet by mouth daily. 90 tablet 3    BP 161/77  Pulse 66  Temp 97.8 F (36.6 C) (Oral)  Resp 20  SpO2 94%  Physical Exam  Constitutional: He appears well-developed and well-nourished.  HENT:  Head: Normocephalic and atraumatic.       Throat red and swollen. No ulcerations. Difficult to see.   Neck: Neck supple.  Cardiovascular: Normal rate, regular rhythm and normal heart sounds.   Pulmonary/Chest: Effort normal and breath sounds normal.  Lymphadenopathy:    He has cervical adenopathy.  Skin: Skin is warm and dry.    ED Course  Procedures (including critical care time)  Labs Reviewed - No data to display No results found.   1. Pharyngitis       MDM          Randa Spike, MD 05/07/12 1308

## 2012-06-24 ENCOUNTER — Other Ambulatory Visit (INDEPENDENT_AMBULATORY_CARE_PROVIDER_SITE_OTHER): Payer: Medicare Other

## 2012-06-24 ENCOUNTER — Encounter: Payer: Self-pay | Admitting: Cardiology

## 2012-06-24 ENCOUNTER — Ambulatory Visit (INDEPENDENT_AMBULATORY_CARE_PROVIDER_SITE_OTHER): Payer: Medicare Other | Admitting: Cardiology

## 2012-06-24 VITALS — BP 128/78 | HR 53 | Ht 68.0 in | Wt 179.0 lb

## 2012-06-24 DIAGNOSIS — E7800 Pure hypercholesterolemia, unspecified: Secondary | ICD-10-CM

## 2012-06-24 DIAGNOSIS — I480 Paroxysmal atrial fibrillation: Secondary | ICD-10-CM

## 2012-06-24 DIAGNOSIS — J449 Chronic obstructive pulmonary disease, unspecified: Secondary | ICD-10-CM

## 2012-06-24 DIAGNOSIS — I2510 Atherosclerotic heart disease of native coronary artery without angina pectoris: Secondary | ICD-10-CM

## 2012-06-24 DIAGNOSIS — I4891 Unspecified atrial fibrillation: Secondary | ICD-10-CM

## 2012-06-24 DIAGNOSIS — R0989 Other specified symptoms and signs involving the circulatory and respiratory systems: Secondary | ICD-10-CM

## 2012-06-24 DIAGNOSIS — I119 Hypertensive heart disease without heart failure: Secondary | ICD-10-CM

## 2012-06-24 LAB — CBC WITH DIFFERENTIAL/PLATELET
Basophils Absolute: 0 10*3/uL (ref 0.0–0.1)
Eosinophils Absolute: 0.4 10*3/uL (ref 0.0–0.7)
Lymphocytes Relative: 17.3 % (ref 12.0–46.0)
MCHC: 33.1 g/dL (ref 30.0–36.0)
Neutrophils Relative %: 69.7 % (ref 43.0–77.0)
Platelets: 192 10*3/uL (ref 150.0–400.0)
RDW: 15.8 % — ABNORMAL HIGH (ref 11.5–14.6)

## 2012-06-24 LAB — BASIC METABOLIC PANEL
CO2: 30 mEq/L (ref 19–32)
Chloride: 103 mEq/L (ref 96–112)
Glucose, Bld: 95 mg/dL (ref 70–99)
Sodium: 139 mEq/L (ref 135–145)

## 2012-06-24 LAB — LIPID PANEL
Total CHOL/HDL Ratio: 2
Triglycerides: 89 mg/dL (ref 0.0–149.0)

## 2012-06-24 LAB — HEPATIC FUNCTION PANEL
ALT: 8 U/L (ref 0–53)
Alkaline Phosphatase: 72 U/L (ref 39–117)
Bilirubin, Direct: 0.1 mg/dL (ref 0.0–0.3)
Total Protein: 6.6 g/dL (ref 6.0–8.3)

## 2012-06-24 NOTE — Progress Notes (Signed)
Quick Note:  Please report to patient. The recent labs are stable. Continue same medication and careful diet. ______ 

## 2012-06-24 NOTE — Assessment & Plan Note (Signed)
The patient has not been on a careful diet.  He has been eating a lot of pork and a lot of wieners which contain a lot of salt.  He has had 2+ ankle edema.

## 2012-06-24 NOTE — Assessment & Plan Note (Signed)
The patient has severe COPD.  Unfortunately he has started smoking again.  He states he smokes 1 pack per week.  I told him that any smoking was too much and his condition and 90 definitely needed to quit altogether permanently.

## 2012-06-24 NOTE — Patient Instructions (Addendum)
YOU NEED TO QUIT SMOKING  Will obtain labs today and call you with the results   Your physician recommends that you schedule a follow-up appointment in: 3 MONTHS  Your physician recommends that you continue on your current medications as directed. Please refer to the Current Medication list given to you today.

## 2012-06-24 NOTE — Assessment & Plan Note (Signed)
The patient has had no recurrent angina since his stent placement in March 2013

## 2012-06-24 NOTE — Progress Notes (Signed)
Sean Figueroa Date of Birth:  1933/08/02 [facility] [address] [place], [place]  [address] [phone]         Fax   [phone]  History of Present Illness: This pleasant 78 year old gentleman is seen for a scheduled followup office visit.  He has ischemic heart disease.  He had a history of a remote apical myocardial infarction.  He developed crescendo angina in March 2013.  He underwent high risk drug-eluting stent placement to the circumflex and a drug-eluting stent to the left main with support of the impellla device by Dr. Excell Seltzer.  He is now on long-term dual antiplatelet therapy with aspirin and Plavix.  Current Outpatient Prescriptions  Medication Sig Dispense Refill  . albuterol (PROAIR HFA) 108 (90 BASE) MCG/ACT inhaler Inhale 2 puffs into the lungs every 6 (six) hours as needed for wheezing.  3 Inhaler  3  . allopurinol (ZYLOPRIM) 100 MG tablet as directed.       Marland Kitchen ALPRAZolam (XANAX) 0.25 MG tablet Take 1 tablet (0.25 mg total) by mouth 2 (two) times daily as needed for anxiety.  180 tablet  1  . aspirin 81 MG tablet Take 81 mg by mouth daily.        . calcium carbonate (OS-CAL - DOSED IN MG OF ELEMENTAL CALCIUM) 1250 MG tablet Take 1 tablet by mouth daily.      . calcium carbonate 200 MG capsule Take 500 mg by mouth daily.        . clopidogrel (PLAVIX) 75 MG tablet Take 1 tablet (75 mg total) by mouth daily with breakfast.  90 tablet  3  . fish oil-omega-3 fatty acids 1000 MG capsule Take 2 g by mouth daily.        . furosemide (LASIX) 40 MG tablet Take 1.5 tablets (60 mg total) by mouth daily.  135 tablet  3  . guaiFENesin (MUCINEX) 600 MG 12 hr tablet Take 1,200 mg by mouth 2 (two) times daily as needed.        Marland Kitchen HYDROcodone-acetaminophen (VICODIN) 5-500 MG per tablet Take 1 tablet by mouth every 6 (six) hours as needed.        . isosorbide mononitrate (IMDUR) 60 MG 24 hr tablet Take 1 tablet (60 mg total) by mouth 2 (two) times daily.  180 tablet  3    . levalbuterol (XOPENEX HFA) 45 MCG/ACT inhaler Inhale 1-2 puffs into the lungs every 4 (four) hours as needed. For shortness of breath      . meclizine (ANTIVERT) 25 MG tablet Take 25 mg by mouth 3 (three) times daily as needed. For dizziness      . metoprolol (LOPRESSOR) 50 MG tablet Take 1 tablet (50 mg total) by mouth 2 (two) times daily.  180 tablet  3  . nitroGLYCERIN (NITROSTAT) 0.4 MG SL tablet Place 1 tablet (0.4 mg total) under the tongue every 5 (five) minutes as needed.  100 tablet  prn  . potassium chloride SA (K-DUR,KLOR-CON) 20 MEQ tablet Take 1 tablet (20 mEq total) by mouth daily.  90 tablet  3  . ranolazine (RANEXA) 500 MG 12 hr tablet Take 1 tablet (500 mg total) by mouth 2 (two) times daily.  180 tablet  3  . rosuvastatin (CRESTOR) 10 MG tablet Take 1 tablet (10 mg total) by mouth daily.  90 tablet  3  . sodium bicarbonate 650 MG tablet Take 650 mg by mouth daily.       . temazepam (RESTORIL) 30  MG capsule Take 1 capsule (30 mg total) by mouth at bedtime as needed. For sleep  90 capsule  1  . theophylline (THEO-24) 300 MG 24 hr capsule Take 1 capsule (300 mg total) by mouth daily.  30 capsule  11  . tiotropium (SPIRIVA HANDIHALER) 18 MCG inhalation capsule Place 1 capsule (18 mcg total) into inhaler and inhale daily.  90 capsule  3  . valsartan-hydrochlorothiazide (DIOVAN-HCT) 160-25 MG per tablet Take 1 tablet by mouth daily.  90 tablet  3    Allergies  Allergen Reactions  . Indomethacin     unknown  . Lipitor (Atorvastatin Calcium)     Leg pain  . Pravachol     Leg pain  . Procardia (Nifedipine)   . Zaroxolyn (Metolazone)     Patient Active Problem List  Diagnosis  . Apical myocardial infarction greater than eight weeks ago  . COPD (chronic obstructive pulmonary disease)  . Tobacco abuse  . Hypercholesterolemia  . Osteoarthritis  . Intermediate coronary syndrome  . Hypokalemia  . Acute on chronic systolic heart failure  . CAD (coronary artery disease)  .  PAF (paroxysmal atrial fibrillation)    History  Smoking status  . Current Some Day Smoker -- 0.5 packs/day  . Types: Cigarettes  Smokeless tobacco  . Not on file    History  Alcohol Use No    Family History  Problem Relation Age of Onset  . Heart disease Mother   . Cerebral palsy Daughter     Review of Systems: Constitutional: no fever chills diaphoresis or fatigue or change in weight.  Head and neck: no hearing loss, no epistaxis, no photophobia or visual disturbance. Respiratory: No cough, shortness of breath or wheezing. Cardiovascular: No chest pain peripheral edema, palpitations. Gastrointestinal: No abdominal distention, no abdominal pain, no change in bowel habits hematochezia or melena. Genitourinary: No dysuria, no frequency, no urgency, no nocturia. Musculoskeletal:No arthralgias, no back pain, no gait disturbance or myalgias. Neurological: No dizziness, no headaches, no numbness, no seizures, no syncope, no weakness, no tremors. Hematologic: No lymphadenopathy, no easy bruising. Psychiatric: No confusion, no hallucinations, no sleep disturbance.    Physical Exam: Filed Vitals:   06/24/12 1146  BP: 128/78  Pulse: 53   the general appearance reveals a elderly gentleman in no distress.The head and neck exam reveals pupils equal and reactive.  Extraocular movements are full.  There is no scleral icterus.  The mouth and pharynx are normal.  The neck is supple.  The carotids reveal no bruits.  The jugular venous pressure is normal.  The  thyroid is not enlarged.  There is no lymphadenopathy.  The chest reveals scattered rhonchi bilaterally  Expansion of the chest is symmetrical.  The precordium is quiet.  The first heart sound is normal.  The second heart sound is physiologically split.  There is no murmur gallop rub or click.  There is no abnormal lift or heave.  The abdomen is soft and nontender.  The bowel sounds are normal.  The liver and spleen are not enlarged.   There are no abdominal masses.  There are no abdominal bruits.  Extremities reveal good pedal pulses.  There is no phlebitis and there is 2+ ankle edema  There is no cyanosis or clubbing.  Strength is normal and symmetrical in all extremities.  There is no lateralizing weakness.  There are no sensory deficits.  The skin is warm and dry.  There is no rash.  EKG today shows sinus bradycardia  and old inferior and old anterior wall myocardial infarction   Assessment / Plan: Continue same medication.  Stop smoking.  Watch diet better.  His weight is up 5 pounds since last visit.  Needs to lose weight.  Blood work today pending.  Recheck 3 months

## 2012-06-25 ENCOUNTER — Telehealth: Payer: Self-pay | Admitting: *Deleted

## 2012-06-25 NOTE — Telephone Encounter (Signed)
Advised patient of lab results  

## 2012-06-25 NOTE — Telephone Encounter (Signed)
Message copied by Burnell Blanks on Thu Jun 25, 2012  2:43 PM ------      Message from: Cassell Clement      Created: Wed Jun 24, 2012  8:26 PM       Please report to patient.  The recent labs are stable. Continue same medication and careful diet.

## 2012-06-29 ENCOUNTER — Other Ambulatory Visit: Payer: Self-pay | Admitting: *Deleted

## 2012-06-29 DIAGNOSIS — F419 Anxiety disorder, unspecified: Secondary | ICD-10-CM

## 2012-06-29 MED ORDER — ALPRAZOLAM 0.25 MG PO TABS: 0.2500 mg | ORAL_TABLET | Freq: Two times a day (BID) | ORAL | Status: DC | PRN

## 2012-06-29 NOTE — Telephone Encounter (Signed)
Refill on alprazolam 

## 2012-09-24 ENCOUNTER — Ambulatory Visit (INDEPENDENT_AMBULATORY_CARE_PROVIDER_SITE_OTHER): Payer: Medicare Other | Admitting: Cardiology

## 2012-09-24 ENCOUNTER — Encounter: Payer: Self-pay | Admitting: Cardiology

## 2012-09-24 VITALS — BP 130/60 | HR 71 | Resp 19 | Ht 68.0 in | Wt 188.4 lb

## 2012-09-24 DIAGNOSIS — I2510 Atherosclerotic heart disease of native coronary artery without angina pectoris: Secondary | ICD-10-CM

## 2012-09-24 DIAGNOSIS — J449 Chronic obstructive pulmonary disease, unspecified: Secondary | ICD-10-CM

## 2012-09-24 DIAGNOSIS — I480 Paroxysmal atrial fibrillation: Secondary | ICD-10-CM

## 2012-09-24 DIAGNOSIS — I4891 Unspecified atrial fibrillation: Secondary | ICD-10-CM

## 2012-09-24 DIAGNOSIS — I259 Chronic ischemic heart disease, unspecified: Secondary | ICD-10-CM

## 2012-09-24 NOTE — Progress Notes (Signed)
Sean Figueroa Date of Birth:  25-Nov-1932 [facility] [address] [place], [place]  [address] [phone]         Fax   [phone]  History of Present Illness: This pleasant 78 year old gentleman is seen for a scheduled followup office visit. He has ischemic heart disease. He had a history of a remote apical myocardial infarction. He developed crescendo angina in March 2013. He underwent high risk drug-eluting stent placement to the circumflex and a drug-eluting stent to the left main with support of the impellla device by Dr. Excell Seltzer. He is now on long-term dual antiplatelet therapy with aspirin and Plavix.  Since last visit he has been doing well   Current Outpatient Prescriptions  Medication Sig Dispense Refill  . albuterol (PROAIR HFA) 108 (90 BASE) MCG/ACT inhaler Inhale 2 puffs into the lungs every 6 (six) hours as needed for wheezing.  3 Inhaler  3  . allopurinol (ZYLOPRIM) 100 MG tablet as directed.       Marland Kitchen ALPRAZolam (XANAX) 0.25 MG tablet Take 1 tablet (0.25 mg total) by mouth 2 (two) times daily as needed for anxiety.  180 tablet  1  . aspirin 81 MG tablet Take 81 mg by mouth daily.        . calcium carbonate (OS-CAL - DOSED IN MG OF ELEMENTAL CALCIUM) 1250 MG tablet Take 1 tablet by mouth daily.      . calcium carbonate 200 MG capsule Take 500 mg by mouth daily.        . clopidogrel (PLAVIX) 75 MG tablet Take 1 tablet (75 mg total) by mouth daily with breakfast.  90 tablet  3  . fish oil-omega-3 fatty acids 1000 MG capsule Take 2 g by mouth daily.        . furosemide (LASIX) 40 MG tablet Take 1.5 tablets (60 mg total) by mouth daily.  135 tablet  3  . guaiFENesin (MUCINEX) 600 MG 12 hr tablet Take 1,200 mg by mouth 2 (two) times daily as needed.        Marland Kitchen HYDROcodone-acetaminophen (VICODIN) 5-500 MG per tablet Take 1 tablet by mouth every 6 (six) hours as needed.        . isosorbide mononitrate (IMDUR) 60 MG 24 hr tablet Take 1 tablet (60 mg total) by  mouth 2 (two) times daily.  180 tablet  3  . levalbuterol (XOPENEX HFA) 45 MCG/ACT inhaler Inhale 1-2 puffs into the lungs every 4 (four) hours as needed. For shortness of breath      . meclizine (ANTIVERT) 25 MG tablet Take 25 mg by mouth 3 (three) times daily as needed. For dizziness      . metoprolol (LOPRESSOR) 50 MG tablet Take 1 tablet (50 mg total) by mouth 2 (two) times daily.  180 tablet  3  . nitroGLYCERIN (NITROSTAT) 0.4 MG SL tablet Place 1 tablet (0.4 mg total) under the tongue every 5 (five) minutes as needed.  100 tablet  prn  . potassium chloride SA (K-DUR,KLOR-CON) 20 MEQ tablet Take 1 tablet (20 mEq total) by mouth daily.  90 tablet  3  . ranolazine (RANEXA) 500 MG 12 hr tablet Take 1 tablet (500 mg total) by mouth 2 (two) times daily.  180 tablet  3  . rosuvastatin (CRESTOR) 10 MG tablet Take 1 tablet (10 mg total) by mouth daily.  90 tablet  3  . sodium bicarbonate 650 MG tablet Take 650 mg by mouth daily.       Marland Kitchen  temazepam (RESTORIL) 30 MG capsule Take 1 capsule (30 mg total) by mouth at bedtime as needed. For sleep  90 capsule  1  . theophylline (THEO-24) 300 MG 24 hr capsule Take 1 capsule (300 mg total) by mouth daily.  30 capsule  11  . tiotropium (SPIRIVA HANDIHALER) 18 MCG inhalation capsule Place 1 capsule (18 mcg total) into inhaler and inhale daily.  90 capsule  3  . valsartan-hydrochlorothiazide (DIOVAN-HCT) 160-25 MG per tablet Take 1 tablet by mouth daily.  90 tablet  3    Allergies  Allergen Reactions  . Indomethacin     unknown  . Lipitor (Atorvastatin Calcium)     Leg pain  . Pravachol     Leg pain  . Procardia (Nifedipine)   . Zaroxolyn (Metolazone)     Patient Active Problem List  Diagnosis  . Apical myocardial infarction greater than eight weeks ago  . COPD (chronic obstructive pulmonary disease)  . Tobacco abuse  . Hypercholesterolemia  . Osteoarthritis  . Intermediate coronary syndrome  . Hypokalemia  . Acute on chronic systolic heart  failure  . CAD (coronary artery disease)  . PAF (paroxysmal atrial fibrillation)    History  Smoking status  . Current Some Day Smoker -- 0.5 packs/day  . Types: Cigarettes  Smokeless tobacco  . Not on file    History  Alcohol Use No    Family History  Problem Relation Age of Onset  . Heart disease Mother   . Cerebral palsy Daughter     Review of Systems: Constitutional: no fever chills diaphoresis or fatigue or change in weight.  Head and neck: no hearing loss, no epistaxis, no photophobia or visual disturbance. Respiratory: No cough, shortness of breath or wheezing. Cardiovascular: No chest pain peripheral edema, palpitations. Gastrointestinal: No abdominal distention, no abdominal pain, no change in bowel habits hematochezia or melena. Genitourinary: No dysuria, no frequency, no urgency, no nocturia. Musculoskeletal:No arthralgias, no back pain, no gait disturbance or myalgias. Neurological: No dizziness, no headaches, no numbness, no seizures, no syncope, no weakness, no tremors. Hematologic: No lymphadenopathy, no easy bruising. Psychiatric: No confusion, no hallucinations, no sleep disturbance.    Physical Exam: Filed Vitals:   09/24/12 1212  BP: 130/60  Pulse: 71  Resp: 19   the general appearance reveals an elderly gentleman in no acute distress.The head and neck exam reveals pupils equal and reactive.  Extraocular movements are full.  There is no scleral icterus.  The mouth and pharynx are normal.  The neck is supple.  The carotids reveal no bruits.  The jugular venous pressure is normal.  The  thyroid is not enlarged.  There is no lymphadenopathy.  The chest  Reveals scattered rhonchi and wheezes bilaterally. Expansion of the chest is symmetrical.  The precordium is quiet.  The first heart sound is normal.  The second heart sound is physiologically split.  There is no murmur gallop rub or click.  There is no abnormal lift or heave.  The abdomen is soft and  nontender.  The bowel sounds are normal.  The liver and spleen are not enlarged.  There are no abdominal masses.  There are no abdominal bruits.  Extremities reveal good pedal pulses.  There is 2+ ankle edema There is no cyanosis or clubbing.  Strength is normal and symmetrical in all extremities.  There is no lateralizing weakness.  There are no sensory deficits.  The skin is warm and dry.  There is no rash.  Assessment / Plan:  The patient has not been on a careful diet.  He has been eating a lot of cake and french fries.  He needs to be more careful with his diet.  His weight is up 9 pounds since last visit.  He also needs to stop smoking.  He will continue same medication and be rechecked in 4 months for followup office visit EKG and fasting lab work.

## 2012-09-24 NOTE — Assessment & Plan Note (Signed)
The patient has not had any paroxysmal atrial fibrillation or palpitations

## 2012-09-24 NOTE — Patient Instructions (Addendum)
Decrease your salt  Decrease your calorie intake  Work harder on weight loss  Stop smoking  Your physician recommends that you continue on your current medications as directed. Please refer to the Current Medication list given to you today.  Your physician recommends that you schedule a follow-up appointment in: 4 months with fasting labs (lp/bmet/hfp) and ekg

## 2012-09-24 NOTE — Assessment & Plan Note (Signed)
The patient continues to have a loose cough.  He is still smoking.  We talked about the need to stop smoking.  He intends to try Nicorette gum.

## 2012-09-24 NOTE — Assessment & Plan Note (Signed)
He remains on dual antiplatelet therapy for his ischemic heart disease.  He is not having any angina pectoris.

## 2012-10-05 ENCOUNTER — Other Ambulatory Visit: Payer: Self-pay | Admitting: Cardiology

## 2012-10-05 DIAGNOSIS — I259 Chronic ischemic heart disease, unspecified: Secondary | ICD-10-CM

## 2012-10-05 DIAGNOSIS — G4700 Insomnia, unspecified: Secondary | ICD-10-CM

## 2012-10-06 NOTE — Telephone Encounter (Signed)
Follow-up:    Patient called in still needing a refill of his temazepam (RESTORIL) 30 MG capsule.

## 2012-10-07 MED ORDER — TEMAZEPAM 30 MG PO CAPS: 30.0000 mg | ORAL_CAPSULE | Freq: Every evening | ORAL | Status: DC | PRN

## 2012-10-07 MED ORDER — ISOSORBIDE MONONITRATE ER 60 MG PO TB24: 60.0000 mg | ORAL_TABLET | Freq: Two times a day (BID) | ORAL | Status: DC

## 2012-10-29 ENCOUNTER — Other Ambulatory Visit: Payer: Self-pay | Admitting: Cardiology

## 2012-10-29 DIAGNOSIS — F419 Anxiety disorder, unspecified: Secondary | ICD-10-CM

## 2012-10-29 NOTE — Telephone Encounter (Signed)
New problem:   [facility] [phone].   xanxa 0.25 mg

## 2012-10-31 MED ORDER — ALPRAZOLAM 0.25 MG PO TABS: 0.2500 mg | ORAL_TABLET | Freq: Two times a day (BID) | ORAL | Status: DC | PRN

## 2012-11-20 ENCOUNTER — Other Ambulatory Visit: Payer: Self-pay | Admitting: Internal Medicine

## 2012-11-20 DIAGNOSIS — R5383 Other fatigue: Secondary | ICD-10-CM

## 2012-11-25 ENCOUNTER — Other Ambulatory Visit: Payer: Medicare Other

## 2012-11-30 ENCOUNTER — Other Ambulatory Visit: Payer: Medicare Other

## 2012-12-11 ENCOUNTER — Other Ambulatory Visit: Payer: Self-pay

## 2012-12-11 DIAGNOSIS — J449 Chronic obstructive pulmonary disease, unspecified: Secondary | ICD-10-CM

## 2012-12-11 MED ORDER — ALBUTEROL SULFATE HFA 108 (90 BASE) MCG/ACT IN AERS: 2.0000 | INHALATION_SPRAY | Freq: Four times a day (QID) | RESPIRATORY_TRACT | Status: DC | PRN

## 2013-01-22 ENCOUNTER — Other Ambulatory Visit: Payer: Medicare Other

## 2013-01-22 ENCOUNTER — Ambulatory Visit: Payer: Medicare Other | Admitting: Cardiology

## 2013-01-26 ENCOUNTER — Other Ambulatory Visit: Payer: Self-pay | Admitting: *Deleted

## 2013-01-26 DIAGNOSIS — E7800 Pure hypercholesterolemia, unspecified: Secondary | ICD-10-CM

## 2013-01-26 MED ORDER — ROSUVASTATIN CALCIUM 10 MG PO TABS: 10.0000 mg | ORAL_TABLET | Freq: Every day | ORAL | Status: DC

## 2013-02-01 ENCOUNTER — Encounter: Payer: Self-pay | Admitting: Cardiology

## 2013-02-01 ENCOUNTER — Ambulatory Visit (INDEPENDENT_AMBULATORY_CARE_PROVIDER_SITE_OTHER): Payer: Medicare Other | Admitting: Cardiology

## 2013-02-01 ENCOUNTER — Other Ambulatory Visit (INDEPENDENT_AMBULATORY_CARE_PROVIDER_SITE_OTHER): Payer: Medicare Other

## 2013-02-01 VITALS — BP 136/78 | HR 53 | Ht 69.0 in | Wt 180.2 lb

## 2013-02-01 DIAGNOSIS — I480 Paroxysmal atrial fibrillation: Secondary | ICD-10-CM

## 2013-02-01 DIAGNOSIS — I259 Chronic ischemic heart disease, unspecified: Secondary | ICD-10-CM

## 2013-02-01 DIAGNOSIS — E7800 Pure hypercholesterolemia, unspecified: Secondary | ICD-10-CM

## 2013-02-01 DIAGNOSIS — I4891 Unspecified atrial fibrillation: Secondary | ICD-10-CM

## 2013-02-01 DIAGNOSIS — F17200 Nicotine dependence, unspecified, uncomplicated: Secondary | ICD-10-CM

## 2013-02-01 DIAGNOSIS — I5023 Acute on chronic systolic (congestive) heart failure: Secondary | ICD-10-CM

## 2013-02-01 DIAGNOSIS — I2510 Atherosclerotic heart disease of native coronary artery without angina pectoris: Secondary | ICD-10-CM

## 2013-02-01 DIAGNOSIS — Z720 Tobacco use: Secondary | ICD-10-CM

## 2013-02-01 LAB — HEPATIC FUNCTION PANEL
ALT: 11 U/L (ref 0–53)
Alkaline Phosphatase: 72 U/L (ref 39–117)
Bilirubin, Direct: 0.1 mg/dL (ref 0.0–0.3)
Total Protein: 6.6 g/dL (ref 6.0–8.3)

## 2013-02-01 LAB — BASIC METABOLIC PANEL
BUN: 14 mg/dL (ref 6–23)
CO2: 34 mEq/L — ABNORMAL HIGH (ref 19–32)
Calcium: 8.6 mg/dL (ref 8.4–10.5)
GFR: 66.08 mL/min (ref >60.00)
Glucose, Bld: 97 mg/dL (ref 70–99)
Sodium: 137 mEq/L (ref 135–145)

## 2013-02-01 LAB — LIPID PANEL
HDL: 40.5 mg/dL (ref >39.00)
Total CHOL/HDL Ratio: 3
VLDL: 22 mg/dL (ref 0.0–40.0)

## 2013-02-01 NOTE — Assessment & Plan Note (Signed)
The patient has been doing very well since his PCI last year.  He has had only 3 episodes of angina since then.  All 3 were related to effort.  One occurred while he was using a bow saw and another when he was dragging some limbs out to the street.

## 2013-02-01 NOTE — Assessment & Plan Note (Signed)
He has not been having a symptoms of CHF or orthopnea or paroxysmal nocturnal dyspnea

## 2013-02-01 NOTE — Assessment & Plan Note (Signed)
The patient has had no recurrence of his paroxysmal atrial fibrillation.  He remains on aspirin and Plavix but is not on Coumadin

## 2013-02-01 NOTE — Patient Instructions (Signed)

## 2013-02-01 NOTE — Progress Notes (Signed)
Quick Note:  Please report to patient. The recent labs are stable. Continue same medication and careful diet. ______ 

## 2013-02-01 NOTE — Assessment & Plan Note (Signed)
Unfortunately the patient continues to smoke against advice.  He blames his smoking being cooped up in his house all winter because of bad weather.  I have once again urged him to quit.

## 2013-02-01 NOTE — Progress Notes (Signed)
Sean Figueroa Date of Birth:  1933/10/17 [facility] [address] [place], [place]  [address] [phone]         Fax   [phone]  History of Present Illness: This pleasant 79 year old gentleman is seen for a scheduled followup office visit. He has ischemic heart disease. He had a history of a remote apical myocardial infarction. He developed crescendo angina in March 2013. He underwent high risk drug-eluting stent placement to the circumflex and a drug-eluting stent to the left main with support of the impellla device by Dr. Excell Seltzer. He is now on long-term dual antiplatelet therapy with aspirin and Plavix. Since last visit he has been doing well.   Current Outpatient Prescriptions  Medication Sig Dispense Refill  . albuterol (PROAIR HFA) 108 (90 BASE) MCG/ACT inhaler Inhale 2 puffs into the lungs every 6 (six) hours as needed for wheezing.  3 Inhaler  3  . allopurinol (ZYLOPRIM) 100 MG tablet as directed.       Marland Kitchen ALPRAZolam (XANAX) 0.25 MG tablet Take 1 tablet (0.25 mg total) by mouth 2 (two) times daily as needed for anxiety.  180 tablet  1  . aspirin 81 MG tablet Take 81 mg by mouth daily.        . calcium carbonate 200 MG capsule Take 500 mg by mouth daily.        . cholecalciferol (VITAMIN D) 400 UNITS TABS Take by mouth.      . clopidogrel (PLAVIX) 75 MG tablet Take 1 tablet (75 mg total) by mouth daily with breakfast.  90 tablet  3  . fish oil-omega-3 fatty acids 1000 MG capsule Take 2 g by mouth daily.        . furosemide (LASIX) 40 MG tablet Take 1.5 tablets (60 mg total) by mouth daily.  135 tablet  3  . guaiFENesin (MUCINEX) 600 MG 12 hr tablet Take 1,200 mg by mouth 2 (two) times daily as needed.        . isosorbide mononitrate (IMDUR) 60 MG 24 hr tablet Take 1 tablet (60 mg total) by mouth 2 (two) times daily.  180 tablet  3  . levalbuterol (XOPENEX HFA) 45 MCG/ACT inhaler Inhale 1-2 puffs into the lungs every 4 (four) hours as needed. For shortness of  breath      . meclizine (ANTIVERT) 25 MG tablet Take 25 mg by mouth 3 (three) times daily as needed. For dizziness      . metoprolol (LOPRESSOR) 50 MG tablet Take 1 tablet (50 mg total) by mouth 2 (two) times daily.  180 tablet  3  . nitroGLYCERIN (NITROSTAT) 0.4 MG SL tablet Place 1 tablet (0.4 mg total) under the tongue every 5 (five) minutes as needed.  100 tablet  prn  . potassium chloride SA (K-DUR,KLOR-CON) 20 MEQ tablet Take 1 tablet (20 mEq total) by mouth daily.  90 tablet  3  . ranolazine (RANEXA) 500 MG 12 hr tablet Take 1 tablet (500 mg total) by mouth 2 (two) times daily.  180 tablet  3  . rosuvastatin (CRESTOR) 10 MG tablet Take 1 tablet (10 mg total) by mouth daily.  90 tablet  0  . sodium bicarbonate 650 MG tablet Take 650 mg by mouth daily.       . temazepam (RESTORIL) 30 MG capsule Take 1 capsule (30 mg total) by mouth at bedtime as needed. For sleep  90 capsule  1  . theophylline (THEO-24) 300 MG 24 hr capsule Take 1  capsule (300 mg total) by mouth daily.  30 capsule  11  . tiotropium (SPIRIVA HANDIHALER) 18 MCG inhalation capsule Place 1 capsule (18 mcg total) into inhaler and inhale daily.  90 capsule  3  . valsartan-hydrochlorothiazide (DIOVAN-HCT) 160-25 MG per tablet Take 1 tablet by mouth daily.  90 tablet  3   No current facility-administered medications for this visit.    Allergies  Allergen Reactions  . Indomethacin     unknown  . Lipitor (Atorvastatin Calcium)     Leg pain  . Pravachol     Leg pain  . Procardia (Nifedipine)   . Zaroxolyn (Metolazone)     Patient Active Problem List  Diagnosis  . Apical myocardial infarction greater than eight weeks ago  . COPD (chronic obstructive pulmonary disease)  . Tobacco abuse  . Hypercholesterolemia  . Osteoarthritis  . Intermediate coronary syndrome  . Hypokalemia  . Acute on chronic systolic heart failure  . CAD (coronary artery disease)  . PAF (paroxysmal atrial fibrillation)    History  Smoking status   . Current Some Day Smoker -- 0.50 packs/day  . Types: Cigarettes  Smokeless tobacco  . Not on file    History  Alcohol Use No    Family History  Problem Relation Age of Onset  . Heart disease Mother   . Cerebral palsy Daughter     Review of Systems: Constitutional: no fever chills diaphoresis or fatigue or change in weight.  Head and neck: no hearing loss, no epistaxis, no photophobia or visual disturbance. Respiratory: No cough, shortness of breath or wheezing. Cardiovascular: No chest pain peripheral edema, palpitations. Gastrointestinal: No abdominal distention, no abdominal pain, no change in bowel habits hematochezia or melena. Genitourinary: No dysuria, no frequency, no urgency, no nocturia. Musculoskeletal:No arthralgias, no back pain, no gait disturbance or myalgias. Neurological: No dizziness, no headaches, no numbness, no seizures, no syncope, no weakness, no tremors. Hematologic: No lymphadenopathy, no easy bruising. Psychiatric: No confusion, no hallucinations, no sleep disturbance.    Physical Exam: Filed Vitals:   02/01/13 1437  BP: 136/78  Pulse: 53   general appearance reveals a well-developed well-nourished elderly gentleman in no distress.The head and neck exam reveals pupils equal and reactive.  Extraocular movements are full.  There is no scleral icterus.  The mouth and pharynx are normal.  The neck is supple.  The carotids reveal no bruits.  The jugular venous pressure is normal.  The  thyroid is not enlarged.  There is no lymphadenopathy.  The chest reveals bilateral musical expiratory rhonchi    Expansion of the chest is symmetrical.  The precordium is quiet.  The first heart sound is normal.  The second heart sound is physiologically split.  There is no murmur gallop rub or click.  There is no abnormal lift or heave.  The abdomen is soft and nontender.  The bowel sounds are normal.  The liver and spleen are not enlarged.  There are no abdominal masses.   There are no abdominal bruits.  Extremities reveal good pedal pulses.  There is no phlebitis and there is trace ankle edema.  There is no cyanosis or clubbing.  Strength is normal and symmetrical in all extremities.  There is no lateralizing weakness.  There are no sensory deficits.  The skin is warm and dry.  There is no rash.  EKG shows sinus bradycardia at 53 per minute.  There is left axis deviation and a pattern of an old inferior and old anterior  wall myocardial infarction.   Assessment / Plan: Continue same medication.  We are checking lab work today.  Recheck in 4 months for followup office visit lipid panel hepatic function panel and basal metabolic panel.

## 2013-02-02 ENCOUNTER — Telehealth: Payer: Self-pay | Admitting: *Deleted

## 2013-02-02 NOTE — Telephone Encounter (Signed)
Advised patient of lab results  

## 2013-02-02 NOTE — Telephone Encounter (Signed)
Message copied by Burnell Blanks on Tue Feb 02, 2013  9:16 AM ------      Message from: Cassell Clement      Created: Mon Feb 01, 2013  7:04 PM       Please report to patient.  The recent labs are stable. Continue same medication and careful diet. ------

## 2013-02-23 ENCOUNTER — Telehealth: Payer: Self-pay | Admitting: Cardiology

## 2013-02-23 NOTE — Telephone Encounter (Signed)
Called patient to inform him that Dr. Patty Sermons will complete paperwork for handicap sticker.  Patient states he does not have a form; that he would like Dr. Patty Sermons to write him a letter and mail it to him then he will take that letter to [employer].

## 2013-02-23 NOTE — Telephone Encounter (Signed)
Yes.  We can do that. Juliette Alcide may have some forms, or if he has one he should drop it by.  I will be back in the office Thursday.

## 2013-02-23 NOTE — Telephone Encounter (Signed)
We have forms.  Have him call office Thursday and discuss with [facility].

## 2013-02-23 NOTE — Telephone Encounter (Signed)
Called patient to inform him to call Thursday to speak to [facility].

## 2013-02-23 NOTE — Telephone Encounter (Signed)
New Problem:    Patient called in wanting to know if Dr. Patty Sermons would write him a letter allowing him to receive a handicap sticker for his vehicle window.  Please call back.

## 2013-02-24 NOTE — Telephone Encounter (Signed)
Will mail signed form to patient

## 2013-03-02 ENCOUNTER — Telehealth: Payer: Self-pay | Admitting: Cardiology

## 2013-03-02 NOTE — Telephone Encounter (Signed)
Will fill out another form and have  Dr. Patty Sermons sign for patient to pick up tomorrow

## 2013-03-02 NOTE — Telephone Encounter (Signed)
New problem   Pt wanted to know if forms for [employer]mv/handicapp sticker has been recv'd/completed

## 2013-03-02 NOTE — Telephone Encounter (Signed)
Advised patient mailed end of last week. He will call tomorrow if he does not get it today so he can pick up at office

## 2013-03-02 NOTE — Telephone Encounter (Signed)
New problem   Pt didn't receive his handicap placement medical form. He was told to call if he didn't get it. Please call pt.

## 2013-03-04 ENCOUNTER — Other Ambulatory Visit: Payer: Self-pay | Admitting: *Deleted

## 2013-03-04 DIAGNOSIS — G4700 Insomnia, unspecified: Secondary | ICD-10-CM

## 2013-03-04 MED ORDER — TEMAZEPAM 30 MG PO CAPS: 30.0000 mg | ORAL_CAPSULE | Freq: Every evening | ORAL | Status: DC | PRN

## 2013-03-15 ENCOUNTER — Other Ambulatory Visit: Payer: Self-pay | Admitting: Internal Medicine

## 2013-03-15 ENCOUNTER — Ambulatory Visit
Admission: RE | Admit: 2013-03-15 | Discharge: 2013-03-15 | Disposition: A | Discharge status: Home / Self Care | Payer: Medicare Other | Source: Ambulatory Visit | Attending: Internal Medicine | Admitting: Internal Medicine

## 2013-03-15 ENCOUNTER — Other Ambulatory Visit: Payer: Self-pay | Admitting: *Deleted

## 2013-03-15 DIAGNOSIS — J449 Chronic obstructive pulmonary disease, unspecified: Secondary | ICD-10-CM

## 2013-03-15 DIAGNOSIS — R05 Cough: Secondary | ICD-10-CM

## 2013-03-15 MED ORDER — THEOPHYLLINE ER 300 MG PO CP24: 300.0000 mg | ORAL_CAPSULE | Freq: Every day | ORAL | Status: DC

## 2013-03-15 NOTE — Telephone Encounter (Signed)
Refilled per patient request, taking at last ov

## 2013-03-29 ENCOUNTER — Other Ambulatory Visit: Payer: Self-pay | Admitting: Internal Medicine

## 2013-03-29 ENCOUNTER — Ambulatory Visit
Admission: RE | Admit: 2013-03-29 | Discharge: 2013-03-29 | Disposition: A | Discharge status: Home / Self Care | Payer: Medicare Other | Source: Ambulatory Visit | Attending: Internal Medicine | Admitting: Internal Medicine

## 2013-03-29 DIAGNOSIS — J189 Pneumonia, unspecified organism: Secondary | ICD-10-CM

## 2013-03-29 DIAGNOSIS — J449 Chronic obstructive pulmonary disease, unspecified: Secondary | ICD-10-CM

## 2013-03-30 ENCOUNTER — Other Ambulatory Visit: Payer: Self-pay | Admitting: Internal Medicine

## 2013-03-30 DIAGNOSIS — J189 Pneumonia, unspecified organism: Secondary | ICD-10-CM

## 2013-04-05 ENCOUNTER — Ambulatory Visit
Admission: RE | Admit: 2013-04-05 | Discharge: 2013-04-05 | Disposition: A | Discharge status: Home / Self Care | Payer: Medicare Other | Source: Ambulatory Visit | Attending: Internal Medicine | Admitting: Internal Medicine

## 2013-04-05 ENCOUNTER — Other Ambulatory Visit: Payer: Medicare Other

## 2013-04-05 DIAGNOSIS — J189 Pneumonia, unspecified organism: Secondary | ICD-10-CM

## 2013-04-05 MED ORDER — IOHEXOL 300 MG/ML  SOLN
75.0000 mL | Freq: Once | INTRAMUSCULAR | Status: AC | PRN
  Administered 2013-04-05: 75 mL via INTRAVENOUS

## 2013-04-14 ENCOUNTER — Telehealth: Payer: Self-pay | Admitting: *Deleted

## 2013-04-14 DIAGNOSIS — J449 Chronic obstructive pulmonary disease, unspecified: Secondary | ICD-10-CM

## 2013-04-14 NOTE — Telephone Encounter (Signed)
Pt states he needs a refill on his [place] [place] will route this to  DR. [place] Nurse

## 2013-04-15 MED ORDER — TIOTROPIUM BROMIDE MONOHYDRATE 18 MCG IN CAPS: 1.0000 | ORAL_CAPSULE | Freq: Every day | RESPIRATORY_TRACT | Status: DC

## 2013-04-15 NOTE — Telephone Encounter (Signed)
Refilled as requested  

## 2013-04-19 ENCOUNTER — Other Ambulatory Visit: Payer: Self-pay | Admitting: *Deleted

## 2013-04-19 DIAGNOSIS — I480 Paroxysmal atrial fibrillation: Secondary | ICD-10-CM

## 2013-04-19 MED ORDER — METOPROLOL TARTRATE 50 MG PO TABS: 50.0000 mg | ORAL_TABLET | Freq: Two times a day (BID) | ORAL | Status: DC

## 2013-05-24 ENCOUNTER — Other Ambulatory Visit: Payer: Self-pay | Admitting: Cardiology

## 2013-05-29 ENCOUNTER — Other Ambulatory Visit: Payer: Self-pay | Admitting: Cardiology

## 2013-05-29 DIAGNOSIS — F419 Anxiety disorder, unspecified: Secondary | ICD-10-CM

## 2013-06-09 ENCOUNTER — Ambulatory Visit (INDEPENDENT_AMBULATORY_CARE_PROVIDER_SITE_OTHER): Payer: Self-pay | Admitting: Cardiology

## 2013-06-09 ENCOUNTER — Encounter: Payer: Self-pay | Admitting: Cardiology

## 2013-06-09 VITALS — BP 140/80 | HR 63 | Ht 68.0 in | Wt 183.0 lb

## 2013-06-09 DIAGNOSIS — I4891 Unspecified atrial fibrillation: Secondary | ICD-10-CM

## 2013-06-09 DIAGNOSIS — I259 Chronic ischemic heart disease, unspecified: Secondary | ICD-10-CM

## 2013-06-09 DIAGNOSIS — I480 Paroxysmal atrial fibrillation: Secondary | ICD-10-CM

## 2013-06-09 DIAGNOSIS — I2510 Atherosclerotic heart disease of native coronary artery without angina pectoris: Secondary | ICD-10-CM

## 2013-06-09 DIAGNOSIS — R9430 Abnormal result of cardiovascular function study, unspecified: Secondary | ICD-10-CM

## 2013-06-09 DIAGNOSIS — R197 Diarrhea, unspecified: Secondary | ICD-10-CM

## 2013-06-09 DIAGNOSIS — J449 Chronic obstructive pulmonary disease, unspecified: Secondary | ICD-10-CM

## 2013-06-09 DIAGNOSIS — IMO0002 Reserved for concepts with insufficient information to code with codable children: Secondary | ICD-10-CM

## 2013-06-09 DIAGNOSIS — R0989 Other specified symptoms and signs involving the circulatory and respiratory systems: Secondary | ICD-10-CM

## 2013-06-09 MED ORDER — CLOPIDOGREL BISULFATE 75 MG PO TABS: 75.0000 mg | ORAL_TABLET | Freq: Every day | ORAL | Status: DC

## 2013-06-09 NOTE — Progress Notes (Signed)
Sean Figueroa Date of Birth:  12/21/1932 [facility] [address] [place], [place]  [address] [phone]         Fax   [phone]  History of Present Illness: This pleasant 79 year old gentleman is seen for a scheduled followup office visit. He has ischemic heart disease. He had a history of a remote apical myocardial infarction. He developed crescendo angina in March 2013. He underwent high risk drug-eluting stent placement to the circumflex and a drug-eluting stent to the left main with support of the impellla device by Dr. Excell Seltzer. He is now on long-term dual antiplatelet therapy with aspirin and Plavix. Since last visit he has been doing well from the cardiac standpoint.. several months ago he had a bad cold and his PCP sent for a chest x-ray which suggested left lower lobe pneumonia.  He was treated with one round of antibiotic.  A second x-ray still showed an infiltrate or atelectasis at the left base and he received a second course of antibiotics.  He subsequently had a CT scan of the chest which showed old scarring at the left base but also raised question of possible low attenuation left apical left ventricular thrombus.  The patient has also been having loose stools since his several rounds of antibiotics.   Current Outpatient Prescriptions  Medication Sig Dispense Refill  . albuterol (PROAIR HFA) 108 (90 BASE) MCG/ACT inhaler Inhale 2 puffs into the lungs every 6 (six) hours as needed for wheezing.  3 Inhaler  3  . allopurinol (ZYLOPRIM) 100 MG tablet as directed.       Marland Kitchen ALPRAZolam (XANAX) 0.25 MG tablet TAKE 1 TABLET TWICE DAILY AS NEEDED  120 tablet  1  . aspirin 81 MG tablet Take 81 mg by mouth daily.        . calcium carbonate 200 MG capsule Take 500 mg by mouth daily.        . cholecalciferol (VITAMIN D) 400 UNITS TABS Take by mouth.      . fish oil-omega-3 fatty acids 1000 MG capsule Take 2 g by mouth daily.        . furosemide (LASIX) 40 MG tablet  Take 1.5 tablets (60 mg total) by mouth daily.  135 tablet  3  . guaiFENesin (MUCINEX) 600 MG 12 hr tablet Take 1,200 mg by mouth 2 (two) times daily as needed.        . isosorbide mononitrate (IMDUR) 60 MG 24 hr tablet Take 1 tablet (60 mg total) by mouth 2 (two) times daily.  180 tablet  3  . levalbuterol (XOPENEX HFA) 45 MCG/ACT inhaler Inhale 1-2 puffs into the lungs every 4 (four) hours as needed. For shortness of breath      . meclizine (ANTIVERT) 25 MG tablet Take 25 mg by mouth 3 (three) times daily as needed. For dizziness      . metoprolol (LOPRESSOR) 50 MG tablet Take 1 tablet (50 mg total) by mouth 2 (two) times daily.  180 tablet  3  . nitroGLYCERIN (NITROSTAT) 0.4 MG SL tablet Place 1 tablet (0.4 mg total) under the tongue every 5 (five) minutes as needed.  100 tablet  prn  . potassium chloride SA (K-DUR,KLOR-CON) 20 MEQ tablet Take 1 tablet (20 mEq total) by mouth daily.  90 tablet  3  . RANEXA 500 MG 12 hr tablet TAKE 1 TABLET TWICE DAILY  180 tablet  0  . rosuvastatin (CRESTOR) 10 MG tablet Take 1 tablet (10 mg total)  by mouth daily.  90 tablet  0  . sodium bicarbonate 650 MG tablet Take 650 mg by mouth daily.       . temazepam (RESTORIL) 30 MG capsule Take 1 capsule (30 mg total) by mouth at bedtime as needed. For sleep  90 capsule  1  . theophylline (THEO-24) 300 MG 24 hr capsule Take 1 capsule (300 mg total) by mouth daily.  30 capsule  5  . tiotropium (SPIRIVA HANDIHALER) 18 MCG inhalation capsule Place 1 capsule (18 mcg total) into inhaler and inhale daily.  90 capsule  3  . valsartan-hydrochlorothiazide (DIOVAN-HCT) 160-25 MG per tablet Take 1 tablet by mouth daily.  90 tablet  3  . clopidogrel (PLAVIX) 75 MG tablet Take 1 tablet (75 mg total) by mouth daily.  90 tablet  3   No current facility-administered medications for this visit.    Allergies  Allergen Reactions  . Indomethacin     unknown  . Lipitor (Atorvastatin Calcium)     Leg pain  . Pravachol     Leg pain    . Procardia (Nifedipine)   . Zaroxolyn (Metolazone)     Patient Active Problem List   Diagnosis Date Noted  . COPD (chronic obstructive pulmonary disease) 02/27/2011    Priority: Medium  . Tobacco abuse 02/27/2011    Priority: Medium  . PAF (paroxysmal atrial fibrillation) 03/24/2012  . CAD (coronary artery disease) 02/03/2012  . Acute on chronic systolic heart failure 01/20/2012  . Hypokalemia 01/15/2012  . Intermediate coronary syndrome 01/14/2012  . Apical myocardial infarction greater than eight weeks ago 02/27/2011  . Hypercholesterolemia 02/27/2011  . Osteoarthritis 02/27/2011    History  Smoking status  . Current Some Day Smoker -- 0.50 packs/day  . Types: Cigarettes  Smokeless tobacco  . Not on file    History  Alcohol Use No    Family History  Problem Relation Age of Onset  . Heart disease Mother   . Cerebral palsy Daughter     Review of Systems: Constitutional: no fever chills diaphoresis or fatigue or change in weight.  Head and neck: no hearing loss, no epistaxis, no photophobia or visual disturbance. Respiratory: No cough, shortness of breath or wheezing. Cardiovascular: No chest pain peripheral edema, palpitations. Gastrointestinal: No abdominal distention, no abdominal pain, no change in bowel habits hematochezia or melena. Genitourinary: No dysuria, no frequency, no urgency, no nocturia. Musculoskeletal:No arthralgias, no back pain, no gait disturbance or myalgias. Neurological: No dizziness, no headaches, no numbness, no seizures, no syncope, no weakness, no tremors. Hematologic: No lymphadenopathy, no easy bruising. Psychiatric: No confusion, no hallucinations, no sleep disturbance.    Physical Exam: Filed Vitals:   06/09/13 1141  BP: 140/80  Pulse: 63   the general appearance reveals a well-developed well-nourished woman in no distress.The head and neck exam reveals pupils equal and reactive.  Extraocular movements are full.  There is no  scleral icterus.  The mouth and pharynx are normal.  The neck is supple.  The carotids reveal no bruits.  The jugular venous pressure is normal.  The  thyroid is not enlarged.  There is no lymphadenopathy.  The chest is clear to percussion and auscultation.  There are no rales or rhonchi.  Expansion of the chest is symmetrical.  The precordium is quiet.  The first heart sound is normal.  The second heart sound is physiologically split.  There is no murmur gallop rub or click.  There is no abnormal lift or heave.  The abdomen is soft and nontender.  The bowel sounds are hyperactive.  The liver and spleen are not enlarged.  There are no abdominal masses.  There are no abdominal bruits.  Extremities reveal good pedal pulses.  There is no phlebitis or edema.  There is no cyanosis or clubbing.  Strength is normal and symmetrical in all extremities.  There is no lateralizing weakness.  There are no sensory deficits.  The skin is warm and dry.  There is no rash.     Assessment / Plan: Continue same medication including daily Plavix.  We will have him return soon for a two-dimensional echocardiogram to evaluate LV function and to rule out left ventricular apical thrombus. Recheck in 4 months for followup office visit. Eat yogurt daily to help with diarrhea.

## 2013-06-09 NOTE — Assessment & Plan Note (Signed)
The patient is no longer coughing up any purulent sputum.  I suggested that he try eating yogurt each day to help with the post antibiotic loose stools which he has been experiencing.

## 2013-06-09 NOTE — Patient Instructions (Addendum)
RESTART PLAVIX 75 MG DAILY  Eat yogurt daily to help with your diarrhea  Your physician has requested that you have an echocardiogram. Echocardiography is a painless test that uses sound waves to create images of your heart. It provides your doctor with information about the size and shape of your heart and how well your heart's chambers and valves are working. This procedure takes approximately one hour. There are no restrictions for this procedure.  Your physician wants you to follow-up in: 4 months ov  You will receive a reminder letter in the mail two months in advance. If you don't receive a letter, please call our office to schedule the follow-up appointment.

## 2013-06-09 NOTE — Assessment & Plan Note (Signed)
The patient has not been experiencing any recurrent chest pain or angina.  He does stay short of breath

## 2013-06-09 NOTE — Assessment & Plan Note (Signed)
The patient has not been aware of any atrial fibrillation recurrent episodes.  He is no longer on warfarin.  For some reason Plavix was not on his current med list but he is to remain on aspirin and Plavix for his ischemic heart disease.

## 2013-06-17 ENCOUNTER — Ambulatory Visit (HOSPITAL_COMMUNITY): Payer: Medicare Other | Attending: Cardiology | Admitting: Radiology

## 2013-06-17 ENCOUNTER — Other Ambulatory Visit (HOSPITAL_COMMUNITY): Payer: Self-pay | Admitting: Cardiology

## 2013-06-17 DIAGNOSIS — I4891 Unspecified atrial fibrillation: Secondary | ICD-10-CM | POA: Insufficient documentation

## 2013-06-17 DIAGNOSIS — J4489 Other specified chronic obstructive pulmonary disease: Secondary | ICD-10-CM | POA: Insufficient documentation

## 2013-06-17 DIAGNOSIS — I059 Rheumatic mitral valve disease, unspecified: Secondary | ICD-10-CM | POA: Insufficient documentation

## 2013-06-17 DIAGNOSIS — F17200 Nicotine dependence, unspecified, uncomplicated: Secondary | ICD-10-CM | POA: Insufficient documentation

## 2013-06-17 DIAGNOSIS — R9430 Abnormal result of cardiovascular function study, unspecified: Secondary | ICD-10-CM

## 2013-06-17 DIAGNOSIS — I2510 Atherosclerotic heart disease of native coronary artery without angina pectoris: Secondary | ICD-10-CM

## 2013-06-17 DIAGNOSIS — J449 Chronic obstructive pulmonary disease, unspecified: Secondary | ICD-10-CM

## 2013-06-17 DIAGNOSIS — I252 Old myocardial infarction: Secondary | ICD-10-CM

## 2013-06-17 DIAGNOSIS — I2589 Other forms of chronic ischemic heart disease: Secondary | ICD-10-CM

## 2013-06-17 DIAGNOSIS — I2584 Coronary atherosclerosis due to calcified coronary lesion: Secondary | ICD-10-CM

## 2013-06-17 DIAGNOSIS — I480 Paroxysmal atrial fibrillation: Secondary | ICD-10-CM

## 2013-06-17 MED ORDER — PERFLUTREN PROTEIN A MICROSPH IV SUSP
2.0000 mL | Freq: Once | INTRAVENOUS | Status: AC
  Administered 2013-06-17: 2 mL via INTRAVENOUS

## 2013-06-17 NOTE — Progress Notes (Signed)
Echocardiogram performed with [facility].  

## 2013-06-25 ENCOUNTER — Other Ambulatory Visit: Payer: Self-pay | Admitting: *Deleted

## 2013-06-25 DIAGNOSIS — I259 Chronic ischemic heart disease, unspecified: Secondary | ICD-10-CM

## 2013-06-25 DIAGNOSIS — E7800 Pure hypercholesterolemia, unspecified: Secondary | ICD-10-CM

## 2013-06-25 MED ORDER — ISOSORBIDE MONONITRATE ER 60 MG PO TB24: 60.0000 mg | ORAL_TABLET | Freq: Two times a day (BID) | ORAL | Status: DC

## 2013-06-25 MED ORDER — ROSUVASTATIN CALCIUM 10 MG PO TABS: 10.0000 mg | ORAL_TABLET | Freq: Every day | ORAL | Status: DC

## 2013-06-28 ENCOUNTER — Other Ambulatory Visit: Payer: Self-pay | Admitting: *Deleted

## 2013-07-06 ENCOUNTER — Other Ambulatory Visit: Payer: Self-pay | Admitting: Cardiology

## 2013-07-18 ENCOUNTER — Other Ambulatory Visit: Payer: Self-pay | Admitting: Cardiology

## 2013-07-26 ENCOUNTER — Other Ambulatory Visit: Payer: Self-pay | Admitting: Cardiology

## 2013-08-02 ENCOUNTER — Other Ambulatory Visit: Payer: Self-pay | Admitting: Cardiology

## 2013-08-11 ENCOUNTER — Other Ambulatory Visit: Payer: Self-pay | Admitting: Cardiology

## 2013-08-13 ENCOUNTER — Other Ambulatory Visit: Payer: Self-pay | Admitting: Cardiology

## 2013-08-13 DIAGNOSIS — G4700 Insomnia, unspecified: Secondary | ICD-10-CM

## 2013-08-27 ENCOUNTER — Telehealth: Payer: Self-pay | Admitting: *Deleted

## 2013-08-27 ENCOUNTER — Other Ambulatory Visit: Payer: Self-pay | Admitting: Cardiology

## 2013-08-27 DIAGNOSIS — I2510 Atherosclerotic heart disease of native coronary artery without angina pectoris: Secondary | ICD-10-CM

## 2013-08-27 DIAGNOSIS — R42 Dizziness and giddiness: Secondary | ICD-10-CM

## 2013-08-27 MED ORDER — MECLIZINE HCL 25 MG PO TABS: 25.0000 mg | ORAL_TABLET | Freq: Three times a day (TID) | ORAL | Status: DC | PRN

## 2013-08-27 MED ORDER — NITROGLYCERIN 0.4 MG SL SUBL: 0.4000 mg | SUBLINGUAL_TABLET | SUBLINGUAL | Status: DC | PRN

## 2013-08-27 NOTE — Telephone Encounter (Signed)
Refilled as requested  

## 2013-08-27 NOTE — Telephone Encounter (Signed)
Pt request refill on Meclizine to be sent to [facility] on [address].

## 2013-08-30 ENCOUNTER — Other Ambulatory Visit: Payer: Self-pay | Admitting: Cardiology

## 2013-08-31 NOTE — Telephone Encounter (Signed)
Patient will discuss further refills with PCP at next [facility]

## 2013-10-15 ENCOUNTER — Encounter: Payer: Self-pay | Admitting: Cardiology

## 2013-10-15 ENCOUNTER — Ambulatory Visit (INDEPENDENT_AMBULATORY_CARE_PROVIDER_SITE_OTHER): Payer: Medicare Other | Admitting: Cardiology

## 2013-10-15 VITALS — BP 138/72 | HR 52 | Ht 68.0 in | Wt 188.0 lb

## 2013-10-15 DIAGNOSIS — I4891 Unspecified atrial fibrillation: Secondary | ICD-10-CM

## 2013-10-15 DIAGNOSIS — I259 Chronic ischemic heart disease, unspecified: Secondary | ICD-10-CM

## 2013-10-15 DIAGNOSIS — E7800 Pure hypercholesterolemia, unspecified: Secondary | ICD-10-CM

## 2013-10-15 DIAGNOSIS — J449 Chronic obstructive pulmonary disease, unspecified: Secondary | ICD-10-CM

## 2013-10-15 DIAGNOSIS — I480 Paroxysmal atrial fibrillation: Secondary | ICD-10-CM

## 2013-10-15 DIAGNOSIS — I5023 Acute on chronic systolic (congestive) heart failure: Secondary | ICD-10-CM

## 2013-10-15 NOTE — Assessment & Plan Note (Signed)
The patient is not having any side effects from the Crestor.  We will be checking his fasting lipids at his next visit

## 2013-10-15 NOTE — Assessment & Plan Note (Signed)
The patient is maintaining normal sinus rhythm.  He has not had any recurrent paroxysmal atrial fibrillation.  He is no longer on warfarin because he is on long-term dual antiplatelet therapy for his stents.

## 2013-10-15 NOTE — Assessment & Plan Note (Signed)
The patient has COPD.  He is still smoking about 2 packs of cigarettes a week.  I have urged him to quit.  His insurance company has notified them that they will no longer be covering his theophylline tablets after January 1.  He may not need these any longer.  He will start to taper back and see how he does without taking them.

## 2013-10-15 NOTE — Patient Instructions (Signed)
Your physician recommends that you continue on your current medications as directed. Please refer to the Current Medication list given to you today.  Your physician recommends that you schedule a follow-up appointment in: 4 months with fasting labs (lp/bmet/hfp)   Let us know how you do tapering off the Theodur

## 2013-10-15 NOTE — Progress Notes (Signed)
Sean Figueroa Date of Birth:  March 01, 1933 [address] [place], [place]  [address] [phone]         Fax   [phone]  History of Present Illness: This pleasant 79 year old gentleman is seen for a scheduled followup office visit. He has ischemic heart disease. He had a history of a remote apical myocardial infarction. He developed crescendo angina in March 2013. He underwent high risk drug-eluting stent placement to the circumflex and a drug-eluting stent to the left main with support of the impellla device by Dr. Excell Seltzer. He is now on long-term dual antiplatelet therapy with aspirin and Plavix. Since last visit he has been doing well from the cardiac standpoint.  He has not had to take nitroglycerin more than once or twice since he had his stents.  He has had no chest pain recently.  He is scheduled for left eye surgery next week Current Outpatient Prescriptions  Medication Sig Dispense Refill  . allopurinol (ZYLOPRIM) 100 MG tablet as directed.       Marland Kitchen ALPRAZolam (XANAX) 0.25 MG tablet TAKE 1 TABLET TWICE DAILY AS NEEDED  120 tablet  1  . aspirin 81 MG tablet Take 81 mg by mouth daily.        . calcium carbonate 200 MG capsule Take 500 mg by mouth daily.        . cholecalciferol (VITAMIN D) 400 UNITS TABS Take by mouth.      . clopidogrel (PLAVIX) 75 MG tablet TAKE 1 TABLET EVERY DAY WITH BREAKFAST  90 tablet  0  . fish oil-omega-3 fatty acids 1000 MG capsule Take 2 g by mouth daily.        . furosemide (LASIX) 40 MG tablet Take 1.5 tablets (60 mg total) by mouth daily.  135 tablet  3  . guaiFENesin (MUCINEX) 600 MG 12 hr tablet Take 1,200 mg by mouth 2 (two) times daily as needed.        . isosorbide mononitrate (IMDUR) 60 MG 24 hr tablet Take 1 tablet (60 mg total) by mouth 2 (two) times daily.  180 tablet  3  . levalbuterol (XOPENEX HFA) 45 MCG/ACT inhaler Inhale 1-2 puffs into the lungs every 4 (four) hours as needed. For shortness of breath      . meclizine  (ANTIVERT) 25 MG tablet Take 1 tablet (25 mg total) by mouth 3 (three) times daily as needed for dizziness. For dizziness  30 tablet  3  . metoprolol (LOPRESSOR) 50 MG tablet Take 1 tablet (50 mg total) by mouth 2 (two) times daily.  180 tablet  3  . nitroGLYCERIN (NITROSTAT) 0.4 MG SL tablet Place 1 tablet (0.4 mg total) under the tongue every 5 (five) minutes as needed.  25 tablet  3  . potassium chloride SA (K-DUR,KLOR-CON) 20 MEQ tablet Take 1 tablet (20 mEq total) by mouth daily.  90 tablet  3  . PROAIR HFA 108 (90 BASE) MCG/ACT inhaler INHALE 2 PUFFS INTO LUNGS EVERY 6 HOURS AS NEEDED FOR WHEEZING  8.5 each  5  . RANEXA 500 MG 12 hr tablet TAKE 1 TABLET TWICE DAILY  180 tablet  0  . rosuvastatin (CRESTOR) 10 MG tablet Take 1 tablet (10 mg total) by mouth daily.  90 tablet  3  . sodium bicarbonate 650 MG tablet Take 650 mg by mouth daily.       . temazepam (RESTORIL) 30 MG capsule TAKE 1 CAPSULE AT BEDTIME AS NEEDED  90 capsule  1  . THEO-24 300 MG 24 hr capsule TAKE 1 CAPSULE (300 MG TOTAL) BY MOUTH DAILY.  30 capsule  1  . tiotropium (SPIRIVA HANDIHALER) 18 MCG inhalation capsule Place 1 capsule (18 mcg total) into inhaler and inhale daily.  90 capsule  3  . valsartan-hydrochlorothiazide (DIOVAN-HCT) 160-25 MG per tablet Take 1 tablet by mouth daily.  90 tablet  3   No current facility-administered medications for this visit.    Allergies  Allergen Reactions  . Indomethacin     unknown  . Lipitor [Atorvastatin Calcium]     Leg pain  . Pravachol     Leg pain  . Procardia [Nifedipine]   . Zaroxolyn [Metolazone]     Patient Active Problem List   Diagnosis Date Noted  . COPD (chronic obstructive pulmonary disease) 02/27/2011    Priority: Medium  . Tobacco abuse 02/27/2011    Priority: Medium  . PAF (paroxysmal atrial fibrillation) 03/24/2012  . CAD (coronary artery disease) 02/03/2012  . Acute on chronic systolic heart failure 01/20/2012  . Hypokalemia 01/15/2012  .  Intermediate coronary syndrome 01/14/2012  . Apical myocardial infarction greater than eight weeks ago 02/27/2011  . Hypercholesterolemia 02/27/2011  . Osteoarthritis 02/27/2011    History  Smoking status  . Current Some Day Smoker -- 0.50 packs/day  . Types: Cigarettes  Smokeless tobacco  . Not on file    History  Alcohol Use No    Family History  Problem Relation Age of Onset  . Heart disease Mother   . Cerebral palsy Daughter     Review of Systems: Constitutional: no fever chills diaphoresis or fatigue or change in weight.  Head and neck: no hearing loss, no epistaxis, no photophobia or visual disturbance. Respiratory: No cough, shortness of breath or wheezing. Cardiovascular: No chest pain peripheral edema, palpitations. Gastrointestinal: No abdominal distention, no abdominal pain, no change in bowel habits hematochezia or melena. Genitourinary: No dysuria, no frequency, no urgency, no nocturia. Musculoskeletal:No arthralgias, no back pain, no gait disturbance or myalgias. Neurological: No dizziness, no headaches, no numbness, no seizures, no syncope, no weakness, no tremors. Hematologic: No lymphadenopathy, no easy bruising. Psychiatric: No confusion, no hallucinations, no sleep disturbance.    Physical Exam: Filed Vitals:   10/15/13 1130  BP: 138/72  Pulse: 52   the general appearance reveals a well-developed well-nourished woman in no distress.The head and neck exam reveals pupils equal and reactive.  Extraocular movements are full.  There is no scleral icterus.  The mouth and pharynx are normal.  The neck is supple.  The carotids reveal no bruits.  The jugular venous pressure is normal.  The  thyroid is not enlarged.  There is no lymphadenopathy.  The chest is clear to percussion and auscultation.  There are no rales or rhonchi.  Expansion of the chest is symmetrical.  The precordium is quiet.  The first heart sound is normal.  The second heart sound is  physiologically split.  There is no murmur gallop rub or click.  There is no abnormal lift or heave.  The abdomen is soft and nontender.  The bowel sounds are hyperactive.  The liver and spleen are not enlarged.  There are no abdominal masses.  There are no abdominal bruits.  Extremities reveal good pedal pulses.  There is no phlebitis or edema.  There is no cyanosis or clubbing.  Strength is normal and symmetrical in all extremities.  There is no lateralizing weakness.  There are no sensory deficits.  The skin is warm and dry.  There is no rash.     Assessment / Plan: Continue same medication including daily Plavix.  . Recheck in 4 months for followup office visit.  He get fasting lipid panel hepatic function panel and basal metabolic panel then He will try tapering off his theophylline. He will be having the first of 2 cataract operations next week.

## 2013-10-15 NOTE — Assessment & Plan Note (Signed)
The patient is not having any paroxysmal nocturnal dyspnea.  He has only trace pretibial edema.  His weight is up 5 pounds since last visit but most of this is accounted for by heavier winter clothes

## 2013-10-22 ENCOUNTER — Other Ambulatory Visit: Payer: Self-pay | Admitting: Cardiology

## 2013-10-22 DIAGNOSIS — F419 Anxiety disorder, unspecified: Secondary | ICD-10-CM

## 2013-10-26 ENCOUNTER — Telehealth: Payer: Self-pay | Admitting: Cardiology

## 2013-10-26 NOTE — Telephone Encounter (Signed)
Walk In pt form "[employer] paper" Dropped Off gave to [facility]

## 2013-11-03 ENCOUNTER — Other Ambulatory Visit: Payer: Self-pay | Admitting: Cardiology

## 2013-11-24 ENCOUNTER — Other Ambulatory Visit: Payer: Self-pay | Admitting: Cardiology

## 2013-12-21 ENCOUNTER — Other Ambulatory Visit: Payer: Self-pay | Admitting: Cardiology

## 2013-12-21 DIAGNOSIS — R42 Dizziness and giddiness: Secondary | ICD-10-CM

## 2013-12-29 ENCOUNTER — Telehealth: Payer: Self-pay

## 2013-12-29 DIAGNOSIS — R42 Dizziness and giddiness: Secondary | ICD-10-CM

## 2013-12-29 MED ORDER — MECLIZINE HCL 25 MG PO TABS: ORAL_TABLET | ORAL | Status: DC

## 2013-12-29 NOTE — Telephone Encounter (Signed)
Refilled again and left message at patients house number

## 2013-12-29 NOTE — Telephone Encounter (Signed)
Okay to refill meclizine 

## 2014-01-14 ENCOUNTER — Other Ambulatory Visit: Payer: Self-pay | Admitting: Cardiology

## 2014-01-18 ENCOUNTER — Other Ambulatory Visit: Payer: Self-pay | Admitting: *Deleted

## 2014-01-18 ENCOUNTER — Telehealth: Payer: Self-pay | Admitting: *Deleted

## 2014-01-18 DIAGNOSIS — G4700 Insomnia, unspecified: Secondary | ICD-10-CM

## 2014-01-18 MED ORDER — TEMAZEPAM 30 MG PO CAPS: 30.0000 mg | ORAL_CAPSULE | Freq: Every evening | ORAL | Status: DC | PRN

## 2014-01-18 NOTE — Telephone Encounter (Signed)
[facility] requests temazepam refill for patient. Thanks, MI

## 2014-01-21 ENCOUNTER — Telehealth: Payer: Self-pay | Admitting: Cardiology

## 2014-01-21 NOTE — Telephone Encounter (Signed)
Yes, okay to take Celebrex on an as-needed basis

## 2014-01-21 NOTE — Telephone Encounter (Signed)
New message   Patient was given celebrex for arthritic . Wants to know can he take this due to heart issues.

## 2014-01-21 NOTE — Telephone Encounter (Signed)
PCP recommended Celebrex 200 mg daily as needed and patient wants to know if ok to take. Will forward to  Dr. Mare Ferrari for Herm[facility] Fiscal

## 2014-01-21 NOTE — Telephone Encounter (Signed)
Advised patient

## 2014-01-21 NOTE — Telephone Encounter (Signed)
Follow up    Patient calling back to speak with nurse regarding medication that he was put by his PCP for arthritic

## 2014-02-06 ENCOUNTER — Other Ambulatory Visit: Payer: Self-pay | Admitting: Cardiology

## 2014-02-07 ENCOUNTER — Other Ambulatory Visit (INDEPENDENT_AMBULATORY_CARE_PROVIDER_SITE_OTHER): Payer: Medicare Other

## 2014-02-07 DIAGNOSIS — E7800 Pure hypercholesterolemia, unspecified: Secondary | ICD-10-CM

## 2014-02-07 LAB — BASIC METABOLIC PANEL
BUN: 22 mg/dL (ref 6–23)
CALCIUM: 9 mg/dL (ref 8.4–10.5)
CO2: 34 mEq/L — ABNORMAL HIGH (ref 19–32)
CREATININE: 1.2 mg/dL (ref 0.4–1.5)
Chloride: 101 mEq/L (ref 96–112)
GFR: 63.34 mL/min (ref >60.00)
GLUCOSE: 102 mg/dL — AB (ref 70–99)
POTASSIUM: 4.2 meq/L (ref 3.5–5.1)
Sodium: 140 mEq/L (ref 135–145)

## 2014-02-07 NOTE — Progress Notes (Signed)
Quick Note:  Please make copy of labs for patient visit. ______ 

## 2014-02-11 ENCOUNTER — Ambulatory Visit: Payer: Medicare Other | Admitting: Cardiology

## 2014-02-24 ENCOUNTER — Other Ambulatory Visit: Payer: Self-pay | Admitting: Cardiology

## 2014-03-16 ENCOUNTER — Ambulatory Visit (INDEPENDENT_AMBULATORY_CARE_PROVIDER_SITE_OTHER): Payer: Medicare Other | Admitting: Cardiology

## 2014-03-16 ENCOUNTER — Encounter: Payer: Self-pay | Admitting: Cardiology

## 2014-03-16 VITALS — BP 124/66 | HR 72 | Ht 68.0 in | Wt 184.0 lb

## 2014-03-16 DIAGNOSIS — I2510 Atherosclerotic heart disease of native coronary artery without angina pectoris: Secondary | ICD-10-CM

## 2014-03-16 DIAGNOSIS — I119 Hypertensive heart disease without heart failure: Secondary | ICD-10-CM

## 2014-03-16 DIAGNOSIS — E7800 Pure hypercholesterolemia, unspecified: Secondary | ICD-10-CM

## 2014-03-16 DIAGNOSIS — I5023 Acute on chronic systolic (congestive) heart failure: Secondary | ICD-10-CM

## 2014-03-16 DIAGNOSIS — I4891 Unspecified atrial fibrillation: Secondary | ICD-10-CM

## 2014-03-16 DIAGNOSIS — I259 Chronic ischemic heart disease, unspecified: Secondary | ICD-10-CM

## 2014-03-16 DIAGNOSIS — J4489 Other specified chronic obstructive pulmonary disease: Secondary | ICD-10-CM

## 2014-03-16 DIAGNOSIS — I480 Paroxysmal atrial fibrillation: Secondary | ICD-10-CM

## 2014-03-16 DIAGNOSIS — J449 Chronic obstructive pulmonary disease, unspecified: Secondary | ICD-10-CM

## 2014-03-16 NOTE — Assessment & Plan Note (Signed)
The patient is not having any recurrent atrial fibrillation episodes.  He is not on anticoagulation because of the need for ongoing dual antiplatelet therapy which takes precedence in his case.  He has a left main stent.

## 2014-03-16 NOTE — Assessment & Plan Note (Signed)
The patient continues to smoke about a half pack of cigarettes a day.  He has a bad cough and was recently treated with a Z-Pak by his PCP

## 2014-03-16 NOTE — Patient Instructions (Signed)
Your physician recommends that you continue on your current medications as directed. Please refer to the Current Medication list given to you today.  Your physician wants you to follow-up in: .4 months with fasting labs (lp/bmet/hfp) AND EKG  You will receive a reminder letter in the mail two months in advance. If you don't receive a letter, please call our office to schedule the follow-up appointment.   YOU NEED TO STOP SMOKING You Can Quit Smoking If you are ready to quit smoking or are thinking about it, congratulations! You have chosen to help yourself be healthier and live longer! There are lots of different ways to quit smoking. Nicotine gum, nicotine patches, a nicotine inhaler, or nicotine nasal spray can help with physical craving. Hypnosis, support groups, and medicines help break the habit of smoking. TIPS TO GET OFF AND STAY OFF CIGARETTES  Learn to predict your moods. Do not let a bad situation be your excuse to have a cigarette. Some situations in your life might tempt you to have a cigarette.  Ask friends and co-workers not to smoke around you.  Make your home smoke-free.  Never have "just one" cigarette. It leads to wanting another and another. Remind yourself of your decision to quit.  On a card, make a list of your reasons for not smoking. Read it at least the same number of times a day as you have a cigarette. Tell yourself everyday, "I do not want to smoke. I choose not to smoke."  Ask someone at home or work to help you with your plan to quit smoking.  Have something planned after you eat or have a cup of coffee. Take a walk or get other exercise to perk you up. This will help to keep you from overeating.  Try a relaxation exercise to calm you down and decrease your stress. Remember, you may be tense and nervous the first two weeks after you quit. This will pass.  Find new activities to keep your hands busy. Play with a pen, coin, or rubber band. Doodle or draw things on  paper.  Brush your teeth right after eating. This will help cut down the craving for the taste of tobacco after meals. You can try mouthwash too.  Try gum, breath mints, or diet candy to keep something in your mouth. IF YOU SMOKE AND WANT TO QUIT:  Do not stock up on cigarettes. Never buy a carton. Wait until one pack is finished before you buy another.  Never carry cigarettes with you at work or at home.  Keep cigarettes as far away from you as possible. Leave them with someone else.  Never carry matches or a lighter with you.  Ask yourself, "Do I need this cigarette or is this just a reflex?"  Bet with someone that you can quit. Put cigarette money in a piggy bank every morning. If you smoke, you give up the money. If you do not smoke, by the end of the week, you keep the money.  Keep trying. It takes 21 days to change a habit!  Talk to your doctor about using medicines to help you quit. These include nicotine replacement gum, lozenges, or skin patches. Document Released: 08/31/2009 Document Revised: 01/27/2012 Document Reviewed: 08/31/2009 [facility] Patient Information 2014 [place].

## 2014-03-16 NOTE — Progress Notes (Signed)
Sean Figueroa Date of Birth:  [birth year] [facility] [address] [place], [place]  [address] [phone]        Fax   [phone]   History of Present Illness: This pleasant 80 year old gentleman is seen for a scheduled followup office visit. He has ischemic heart disease. He had a history of a remote apical myocardial infarction. He developed crescendo angina in March 2013. He underwent high risk drug-eluting stent placement to the circumflex and a drug-eluting stent to the left main with support of the impellla device by Dr. Burt Knack. He is now on long-term dual antiplatelet therapy with aspirin and Plavix. Since last visit he has been doing well from the cardiac standpoint. He has not had to take nitroglycerin more than once or twice since he had his stents. He has had no chest pain recently.  He continues to smoke and is under current treatment for acute bronchitis      Current Outpatient Prescriptions  Medication Sig Dispense Refill  . allopurinol (ZYLOPRIM) 100 MG tablet as directed.       Marland Kitchen ALPRAZolam (XANAX) 0.25 MG tablet TAKE 1 TABLET TWICE DAILY AS NEEDED  120 tablet  1  . aspirin 81 MG tablet Take 81 mg by mouth daily.        . calcium carbonate 200 MG capsule Take 500 mg by mouth daily.        . cholecalciferol (VITAMIN D) 400 UNITS TABS Take by mouth.      . clopidogrel (PLAVIX) 75 MG tablet TAKE 1 TABLET EVERY DAY WITH BREAKFAST  90 tablet  0  . fish oil-omega-3 fatty acids 1000 MG capsule Take 2 g by mouth daily.        . furosemide (LASIX) 40 MG tablet Take 1.5 tablets (60 mg total) by mouth daily.  135 tablet  3  . guaiFENesin (MUCINEX) 600 MG 12 hr tablet Take 1,200 mg by mouth 2 (two) times daily as needed.        . isosorbide mononitrate (IMDUR) 60 MG 24 hr tablet Take 1 tablet (60 mg total) by mouth 2 (two) times daily.  180 tablet  3  . levalbuterol (XOPENEX HFA) 45 MCG/ACT inhaler Inhale 1-2 puffs into the lungs every 4 (four) hours as  needed. For shortness of breath      . meclizine (ANTIVERT) 25 MG tablet TAKE 1 TABLET (25 MG TOTAL) BY MOUTH 3 (THREE) TIMES DAILY AS NEEDED FOR DIZZINESS. FOR DIZZINESS  30 tablet  3  . metoprolol (LOPRESSOR) 50 MG tablet Take 1 tablet (50 mg total) by mouth 2 (two) times daily.  180 tablet  3  . nitroGLYCERIN (NITROSTAT) 0.4 MG SL tablet Place 1 tablet (0.4 mg total) under the tongue every 5 (five) minutes as needed.  25 tablet  3  . potassium chloride SA (K-DUR,KLOR-CON) 20 MEQ tablet Take 1 tablet (20 mEq total) by mouth daily.  90 tablet  3  . PROAIR HFA 108 (90 BASE) MCG/ACT inhaler INHALE 2 PUFFS INTO LUNGS EVERY 6 HOURS AS NEEDED FOR WHEEZING  8.5 each  5  . RANEXA 500 MG 12 hr tablet TAKE 1 TABLET TWICE DAILY  180 tablet  1  . rosuvastatin (CRESTOR) 10 MG tablet Take 1 tablet (10 mg total) by mouth daily.  90 tablet  3  . sodium bicarbonate 650 MG tablet Take 650 mg by mouth daily.       . temazepam (RESTORIL) 30 MG capsule Take  1 capsule (30 mg total) by mouth at bedtime as needed for sleep.  90 capsule  1  . THEO-24 300 MG 24 hr capsule TAKE 1 CAPSULE (300 MG TOTAL) BY MOUTH DAILY.  30 capsule  1  . tiotropium (SPIRIVA HANDIHALER) 18 MCG inhalation capsule Place 1 capsule (18 mcg total) into inhaler and inhale daily.  90 capsule  3  . valsartan-hydrochlorothiazide (DIOVAN-HCT) 160-25 MG per tablet Take 1 tablet by mouth daily.  90 tablet  3   No current facility-administered medications for this visit.    Allergies  Allergen Reactions  . Indomethacin     unknown  . Lipitor [Atorvastatin Calcium]     Leg pain  . Pravachol     Leg pain  . Procardia [Nifedipine]   . Zaroxolyn [Metolazone]     Patient Active Problem List   Diagnosis Date Noted  . COPD (chronic obstructive pulmonary disease) 02/27/2011    Priority: Medium  . Tobacco abuse 02/27/2011    Priority: Medium  . PAF (paroxysmal atrial fibrillation) 03/24/2012  . CAD (coronary artery disease) 02/03/2012  . Acute  on chronic systolic heart failure 91/47/8295  . Hypokalemia 01/15/2012  . Intermediate coronary syndrome 01/14/2012  . Apical myocardial infarction greater than eight weeks ago 02/27/2011  . Hypercholesterolemia 02/27/2011  . Osteoarthritis 02/27/2011    History  Smoking status  . Current Some Day Smoker -- 0.50 packs/day  . Types: Cigarettes  Smokeless tobacco  . Not on file    History  Alcohol Use No    Family History  Problem Relation Age of Onset  . Heart disease Mother   . Cerebral palsy Daughter     Review of Systems: Constitutional: no fever chills diaphoresis or fatigue or change in weight.  Head and neck: no hearing loss, no epistaxis, no photophobia or visual disturbance. Respiratory: No cough, shortness of breath or wheezing. Cardiovascular: No chest pain peripheral edema, palpitations. Gastrointestinal: No abdominal distention, no abdominal pain, no change in bowel habits hematochezia or melena. Genitourinary: No dysuria, no frequency, no urgency, no nocturia. Musculoskeletal:No arthralgias, no back pain, no gait disturbance or myalgias. Neurological: No dizziness, no headaches, no numbness, no seizures, no syncope, no weakness, no tremors. Hematologic: No lymphadenopathy, no easy bruising. Psychiatric: No confusion, no hallucinations, no sleep disturbance.    Physical Exam: Filed Vitals:   03/16/14 1536  BP: 124/66  Pulse: 72   the general appearance reveals an elderly gentleman in no acute distress.  He has a cough with coarse breath sounds.The head and neck exam reveals pupils equal and reactive.  Extraocular movements are full.  There is no scleral icterus.  The mouth and pharynx are normal.  The neck is supple.  The carotids reveal no bruits.  The jugular venous pressure is normal.  The  thyroid is not enlarged.  There is no lymphadenopathy.   The chest reveals coarse rhonchi bilaterally. Expansion of the chest is symmetrical.  The precordium is quiet.   The first heart sound is normal.  The second heart sound is physiologically split.  There is no murmur gallop rub or click.  There is no abnormal lift or heave.  The abdomen is soft and nontender.  The bowel sounds are normal.  The liver and spleen are not enlarged.  There are no abdominal masses.  There are no abdominal bruits.  Extremities reveal good pedal pulses.  There is no phlebitis or edema.  There is no cyanosis or clubbing.  Strength is normal and  symmetrical in all extremities.  There is no lateralizing weakness.  There are no sensory deficits.  The skin is warm and dry.  There is no rash.     Assessment / Plan: 1.  Ischemic heart disease with old apical myocardial infarction and with past history of crescendo angina in March 2013 for which he underwent drug-eluting stent to his circumflex and drug-eluting stent to his left main coronary artery.  No recent angina pectoris. 2. COPD with ongoing tobacco abuse 3. Hypercholesterolemia 4.  Past history of paroxysmal atrial fibrillation.  No recent recurrence   Continue same cardiac meds.  I encouraged him very strongly to smoke stop smoking altogether once and for all. Recheck here in 4 months for office visit lipid panel hepatic function panel basal metabolic panel and EKG

## 2014-03-16 NOTE — Assessment & Plan Note (Signed)
Patient continues on platelet therapy.  He has not had any significant recurrence of chest pain or angina.

## 2014-03-23 ENCOUNTER — Other Ambulatory Visit: Payer: Self-pay | Admitting: Cardiology

## 2014-03-23 DIAGNOSIS — J441 Chronic obstructive pulmonary disease with (acute) exacerbation: Secondary | ICD-10-CM

## 2014-03-23 DIAGNOSIS — F419 Anxiety disorder, unspecified: Secondary | ICD-10-CM

## 2014-03-23 DIAGNOSIS — J449 Chronic obstructive pulmonary disease, unspecified: Secondary | ICD-10-CM

## 2014-03-23 DIAGNOSIS — R0603 Acute respiratory distress: Secondary | ICD-10-CM

## 2014-03-25 ENCOUNTER — Other Ambulatory Visit: Payer: Self-pay | Admitting: Cardiology

## 2014-03-25 DIAGNOSIS — F419 Anxiety disorder, unspecified: Secondary | ICD-10-CM

## 2014-03-25 NOTE — Telephone Encounter (Signed)
Called pharmacy again to refill

## 2014-04-27 ENCOUNTER — Other Ambulatory Visit: Payer: Self-pay | Admitting: Cardiology

## 2014-05-08 ENCOUNTER — Other Ambulatory Visit: Payer: Self-pay | Admitting: Cardiology

## 2014-05-26 ENCOUNTER — Ambulatory Visit
Admission: RE | Admit: 2014-05-26 | Discharge: 2014-05-26 | Disposition: A | Discharge status: Home / Self Care | Payer: Medicare Other | Source: Ambulatory Visit | Attending: Internal Medicine | Admitting: Internal Medicine

## 2014-05-26 ENCOUNTER — Other Ambulatory Visit: Payer: Self-pay | Admitting: Internal Medicine

## 2014-05-26 DIAGNOSIS — M25552 Pain in left hip: Secondary | ICD-10-CM

## 2014-06-13 ENCOUNTER — Other Ambulatory Visit: Payer: Self-pay | Admitting: Cardiology

## 2014-06-13 DIAGNOSIS — G4700 Insomnia, unspecified: Secondary | ICD-10-CM

## 2014-07-18 ENCOUNTER — Ambulatory Visit (INDEPENDENT_AMBULATORY_CARE_PROVIDER_SITE_OTHER): Payer: Medicare Other | Admitting: Cardiology

## 2014-07-18 ENCOUNTER — Encounter: Payer: Self-pay | Admitting: Cardiology

## 2014-07-18 ENCOUNTER — Other Ambulatory Visit: Payer: Medicare Other

## 2014-07-18 VITALS — BP 136/80 | HR 64 | Ht 68.0 in | Wt 196.0 lb

## 2014-07-18 DIAGNOSIS — I119 Hypertensive heart disease without heart failure: Secondary | ICD-10-CM

## 2014-07-18 DIAGNOSIS — I2510 Atherosclerotic heart disease of native coronary artery without angina pectoris: Secondary | ICD-10-CM

## 2014-07-18 DIAGNOSIS — J438 Other emphysema: Secondary | ICD-10-CM

## 2014-07-18 DIAGNOSIS — I5023 Acute on chronic systolic (congestive) heart failure: Secondary | ICD-10-CM

## 2014-07-18 DIAGNOSIS — I259 Chronic ischemic heart disease, unspecified: Secondary | ICD-10-CM

## 2014-07-18 DIAGNOSIS — E7800 Pure hypercholesterolemia, unspecified: Secondary | ICD-10-CM

## 2014-07-18 LAB — BASIC METABOLIC PANEL
BUN: 14 mg/dL (ref 6–23)
CO2: 32 mEq/L (ref 19–32)
CREATININE: 1 mg/dL (ref 0.4–1.5)
Calcium: 9 mg/dL (ref 8.4–10.5)
Chloride: 100 mEq/L (ref 96–112)
GFR: 78.39 mL/min (ref >60.00)
Glucose, Bld: 109 mg/dL — ABNORMAL HIGH (ref 70–99)
Potassium: 4 mEq/L (ref 3.5–5.1)
Sodium: 139 mEq/L (ref 135–145)

## 2014-07-18 LAB — LIPID PANEL
CHOLESTEROL: 100 mg/dL (ref 0–200)
HDL: 42.5 mg/dL (ref >39.00)
LDL Cholesterol: 45 mg/dL (ref 0–99)
NonHDL: 57.5
TRIGLYCERIDES: 64 mg/dL (ref 0.0–149.0)
Total CHOL/HDL Ratio: 2
VLDL: 12.8 mg/dL (ref 0.0–40.0)

## 2014-07-18 LAB — HEPATIC FUNCTION PANEL
ALK PHOS: 59 U/L (ref 39–117)
ALT: 9 U/L (ref 0–53)
AST: 9 U/L (ref 0–37)
Albumin: 3 g/dL — ABNORMAL LOW (ref 3.5–5.2)
BILIRUBIN DIRECT: 0.2 mg/dL (ref 0.0–0.3)
Total Bilirubin: 0.8 mg/dL (ref 0.2–1.2)
Total Protein: 6.3 g/dL (ref 6.0–8.3)

## 2014-07-18 NOTE — Assessment & Plan Note (Signed)
The patient is on Crestor for his hypercholesterolemia.  He has not had any myalgias.  Lab work today pending.  The patient does complain of his legs feeling tired.  However he also complains of lack of energy in general.

## 2014-07-18 NOTE — Progress Notes (Signed)
Quick Note:  Please report to patient. The recent labs are stable. Continue same medication and careful diet. ______ 

## 2014-07-18 NOTE — Patient Instructions (Signed)
Will obtain labs today and call you with the results (lp/bmet/hfp)  Your physician recommends that you continue on your current medications as directed. Please refer to the Current Medication list given to you today.  Your physician wants you to follow-up in: 4 months with fasting labs (lp/bmet/hfp)  You will receive a reminder letter in the mail two months in advance. If you don't receive a letter, please call our office to schedule the follow-up appointment.   Continue to work on stopping smoking

## 2014-07-18 NOTE — Assessment & Plan Note (Signed)
The patient has not been experiencing any persistent nocturnal dyspnea.  He does continue to have dependent 2+ pretibial and pedal edema

## 2014-07-18 NOTE — Assessment & Plan Note (Signed)
The patient has significant exertional dyspnea related to his COPD.  We spoke at length about the importance of quitting smoking altogether.

## 2014-07-18 NOTE — Assessment & Plan Note (Signed)
The patient remains on dual antiplatelet therapy.  He has not had any recurrent chest pain.  He has not had to take any sublingual nitroglycerin.

## 2014-07-18 NOTE — Progress Notes (Signed)
Sean Figueroa Date of Birth:  08-20-1933 [facility] [address] [place], [place]  [address] [phone]        Fax   [phone]   History of Present Illness: This pleasant 80 year old gentleman is seen for a scheduled followup office visit. He has ischemic heart disease. He had a history of a remote apical myocardial infarction. He developed crescendo angina in March 2013. He underwent high risk drug-eluting stent placement to the circumflex and a drug-eluting stent to the left main with support of the impellla device by Dr. Burt Knack. He is now on long-term dual antiplatelet therapy with aspirin and Plavix. Since last visit he has been doing well from the cardiac standpoint. He has not had to take nitroglycerin more than once or twice since he had his stents. He has had no chest pain recently. He continues to smoke and is under current treatment for acute bronchitis.  He is currently on antibiotics and prednisone.  He is trying to quit smoking.  He is also taking Mucinex.   Current Outpatient Prescriptions  Medication Sig Dispense Refill  . allopurinol (ZYLOPRIM) 100 MG tablet as directed.       Marland Kitchen ALPRAZolam (XANAX) 0.25 MG tablet TAKE 1 TABLET TWICE DAILY AS NEEDED  120 tablet  1  . aspirin 81 MG tablet Take 81 mg by mouth daily.        . calcium carbonate 200 MG capsule Take 500 mg by mouth daily.        . cholecalciferol (VITAMIN D) 400 UNITS TABS Take by mouth.      . clopidogrel (PLAVIX) 75 MG tablet TAKE 1 TABLET EVERY DAY WITH BREAKFAST  90 tablet  0  . fish oil-omega-3 fatty acids 1000 MG capsule Take 2 g by mouth daily.        . furosemide (LASIX) 40 MG tablet Take 1.5 tablets (60 mg total) by mouth daily.  135 tablet  3  . guaiFENesin (MUCINEX) 600 MG 12 hr tablet Take 1,200 mg by mouth 2 (two) times daily as needed.        . isosorbide mononitrate (IMDUR) 60 MG 24 hr tablet Take 1 tablet (60 mg total) by mouth 2 (two) times daily.  180 tablet  3  .  levalbuterol (XOPENEX HFA) 45 MCG/ACT inhaler Inhale 1-2 puffs into the lungs every 4 (four) hours as needed. For shortness of breath      . levofloxacin (LEVAQUIN) 500 MG tablet       . meclizine (ANTIVERT) 25 MG tablet TAKE 1 TABLET (25 MG TOTAL) BY MOUTH 3 (THREE) TIMES DAILY AS NEEDED FOR DIZZINESS. FOR DIZZINESS  30 tablet  3  . metoprolol (LOPRESSOR) 50 MG tablet TAKE 1 TABLET (50 MG TOTAL) BY MOUTH 2 (TWO) TIMES DAILY.  180 tablet  3  . nitroGLYCERIN (NITROSTAT) 0.4 MG SL tablet Place 1 tablet (0.4 mg total) under the tongue every 5 (five) minutes as needed.  25 tablet  3  . potassium chloride SA (K-DUR,KLOR-CON) 20 MEQ tablet Take 1 tablet (20 mEq total) by mouth daily.  90 tablet  3  . predniSONE (DELTASONE) 10 MG tablet       . PROAIR HFA 108 (90 BASE) MCG/ACT inhaler INHALE 2 PUFFS INTO LUNGS EVERY 6 HOURS AS NEEDED FOR WHEEZING  8.5 each  5  . RANEXA 500 MG 12 hr tablet TAKE 1 TABLET TWICE DAILY  180 tablet  1  . rosuvastatin (CRESTOR) 10 MG  tablet Take 1 tablet (10 mg total) by mouth daily.  90 tablet  3  . sodium bicarbonate 650 MG tablet Take 650 mg by mouth daily.       Marland Kitchen SPIRIVA HANDIHALER 18 MCG inhalation capsule PLACE 1 CAPSULE (18 MCG TOTAL) INTO INHALER AND INHALE DAILY.  90 capsule  3  . temazepam (RESTORIL) 30 MG capsule TAKE ONE CAPSULE BY MOUTH AT BEDTIME AS NEEDED  90 capsule  1  . THEO-24 300 MG 24 hr capsule TAKE 1 CAPSULE (300 MG TOTAL) BY MOUTH DAILY.  30 capsule  1  . traMADol (ULTRAM) 50 MG tablet       . valsartan-hydrochlorothiazide (DIOVAN-HCT) 160-25 MG per tablet Take 1 tablet by mouth daily.  90 tablet  3   No current facility-administered medications for this visit.    Allergies  Allergen Reactions  . Indomethacin     unknown  . Lipitor [Atorvastatin Calcium]     Leg pain  . Pravachol     Leg pain  . Procardia [Nifedipine]   . Zaroxolyn [Metolazone]     Patient Active Problem List   Diagnosis Date Noted  . COPD (chronic obstructive pulmonary  disease) 02/27/2011    Priority: Medium  . Tobacco abuse 02/27/2011    Priority: Medium  . PAF (paroxysmal atrial fibrillation) 03/24/2012  . CAD (coronary artery disease) 02/03/2012  . Acute on chronic systolic heart failure 12/87/8676  . Hypokalemia 01/15/2012  . Intermediate coronary syndrome 01/14/2012  . Apical myocardial infarction greater than eight weeks ago 02/27/2011  . Hypercholesterolemia 02/27/2011  . Osteoarthritis 02/27/2011    History  Smoking status  . Current Some Day Smoker -- 0.50 packs/day  . Types: Cigarettes  Smokeless tobacco  . Not on file    History  Alcohol Use No    Family History  Problem Relation Age of Onset  . Heart disease Mother   . Cerebral palsy Daughter     Review of Systems: Constitutional: no fever chills diaphoresis or fatigue or change in weight.  Head and neck: no hearing loss, no epistaxis, no photophobia or visual disturbance. Respiratory: No cough, shortness of breath or wheezing. Cardiovascular: No chest pain peripheral edema, palpitations. Gastrointestinal: No abdominal distention, no abdominal pain, no change in bowel habits hematochezia or melena. Genitourinary: No dysuria, no frequency, no urgency, no nocturia. Musculoskeletal:No arthralgias, no back pain, no gait disturbance or myalgias. Neurological: No dizziness, no headaches, no numbness, no seizures, no syncope, no weakness, no tremors. Hematologic: No lymphadenopathy, no easy bruising. Psychiatric: No confusion, no hallucinations, no sleep disturbance.    Physical Exam: Filed Vitals:   07/18/14 1057  BP: 136/80  Pulse:   The patient appears to be in no distress.  He has a nonproductive cough.  He is mildly dyspneic at rest.  Head and neck exam reveals that the pupils are equal and reactive.  The extraocular movements are full.  There is no scleral icterus.  Mouth and pharynx are benign.  No lymphadenopathy.  No carotid bruits.  The jugular venous pressure is  normal.  Thyroid is not enlarged or tender.  Chest reveals scattered rhonchi and wheezes. Expansion of the chest is symmetrical.  Heart reveals no abnormal lift or heave.  First and second heart sounds are normal.  There is no murmur gallop rub or click.  The abdomen is soft and nontender.  Bowel sounds are normoactive.  There is no hepatosplenomegaly or mass.  There are no abdominal bruits.  Extremities reveal 2+  pretibial and pedal edema.    There is no cyanosis or clubbing.  Neurologic exam is normal strength and no lateralizing weakness.  No sensory deficits.  Integument reveals no rash  EKG shows normal sinus rhythm and old inferior and anteroseptal myocardial infarctions.  Since last tracing of 02/01/13, the heart rate has increased.  Assessment / Plan: 1.  Ischemic heart disease status post remote apical myocardial infarction and more recently status post drug-eluting stent placement in March 2013 to the circumflex and drug-eluting stent to the left main coronary artery.  The patient is on long-term dual antiplatelet therapy. 2. COPD with ongoing tobacco abuse 3. Hypercholesterolemia 4. past history of gout, on allopurinol 5. Osteoarthritis   Disposition: Continue same medication.  Urged to quit smoking altogether.  Recheck in 4 months for office visit, lipid panel, basic metabolic panel, and hepatic function panel.

## 2014-08-04 ENCOUNTER — Other Ambulatory Visit: Payer: Self-pay | Admitting: Cardiology

## 2014-08-10 ENCOUNTER — Other Ambulatory Visit: Payer: Self-pay | Admitting: Cardiology

## 2014-08-16 ENCOUNTER — Other Ambulatory Visit: Payer: Self-pay | Admitting: Internal Medicine

## 2014-08-16 DIAGNOSIS — R55 Syncope and collapse: Secondary | ICD-10-CM

## 2014-08-16 DIAGNOSIS — I25119 Atherosclerotic heart disease of native coronary artery with unspecified angina pectoris: Secondary | ICD-10-CM

## 2014-08-17 ENCOUNTER — Ambulatory Visit
Admission: RE | Admit: 2014-08-17 | Discharge: 2014-08-17 | Disposition: A | Discharge status: Home / Self Care | Payer: Medicare Other | Source: Ambulatory Visit | Attending: Internal Medicine | Admitting: Internal Medicine

## 2014-08-17 ENCOUNTER — Other Ambulatory Visit: Payer: Self-pay | Admitting: Internal Medicine

## 2014-08-17 DIAGNOSIS — I25119 Atherosclerotic heart disease of native coronary artery with unspecified angina pectoris: Secondary | ICD-10-CM

## 2014-08-17 DIAGNOSIS — R55 Syncope and collapse: Secondary | ICD-10-CM

## 2014-08-22 ENCOUNTER — Other Ambulatory Visit: Payer: Self-pay | Admitting: Cardiology

## 2014-08-23 ENCOUNTER — Other Ambulatory Visit: Payer: Medicare Other

## 2014-08-24 ENCOUNTER — Other Ambulatory Visit: Payer: Self-pay | Admitting: Cardiology

## 2014-09-08 ENCOUNTER — Other Ambulatory Visit: Payer: Self-pay | Admitting: *Deleted

## 2014-09-08 DIAGNOSIS — G4700 Insomnia, unspecified: Secondary | ICD-10-CM

## 2014-09-08 DIAGNOSIS — F419 Anxiety disorder, unspecified: Secondary | ICD-10-CM

## 2014-09-08 MED ORDER — ALPRAZOLAM 0.25 MG PO TABS: ORAL_TABLET | ORAL | Status: DC

## 2014-09-08 MED ORDER — TEMAZEPAM 30 MG PO CAPS: ORAL_CAPSULE | ORAL | Status: DC

## 2014-09-08 NOTE — Telephone Encounter (Signed)
Patient requests alprazolam and temazepam refill. He would like this to be sent to [facility]. Thanks, MI

## 2014-09-09 ENCOUNTER — Encounter: Payer: Self-pay | Admitting: Internal Medicine

## 2014-09-09 ENCOUNTER — Ambulatory Visit (INDEPENDENT_AMBULATORY_CARE_PROVIDER_SITE_OTHER): Payer: Medicare Other | Admitting: Internal Medicine

## 2014-09-09 VITALS — BP 148/88 | HR 60 | Ht 69.0 in | Wt 190.0 lb

## 2014-09-09 DIAGNOSIS — J449 Chronic obstructive pulmonary disease, unspecified: Secondary | ICD-10-CM | POA: Insufficient documentation

## 2014-09-09 MED ORDER — FLUTICASONE-SALMETEROL 100-50 MCG/DOSE IN AEPB: 1.0000 | INHALATION_SPRAY | Freq: Two times a day (BID) | RESPIRATORY_TRACT | Status: DC

## 2014-09-09 NOTE — Patient Instructions (Signed)
Yuo seem to have bad copd Continue spiriva daily STart advair disc 100/50 , 1 puff twice daily Have CXR 2 view next week or two Have PFT test next week or two  Followup   - finish above test and return to see my NP Tammy next week or two

## 2014-09-09 NOTE — Progress Notes (Signed)
Subjective:    Patient ID: Sean Figueroa, male    DOB: 1933/01/16, 80 y.o.   MRN: [id number] IOV 09/09/2014 PCP [name], MD Cards - Dr Mare Ferrari   HPI   IOV 09/09/2014  Chief Complaint  Patient presents with  . Pulmonary Consult    Pt referred by Dr. Mellody Drown for COPD.    80 year old male accompanied by his neighbor  He had a history of a remote apical myocardial infarction. He developed crescendo angina in March 2013. He underwent high risk drug-eluting stent placement to the circumflex and a drug-eluting stent to the left main with support of the impellla device by Dr. Burt Knack. He is now on long-term dual antiplatelet therapy with aspirin and Plavix. Most recent visit 07/18/14 with cardiology felt to be stable from a cardiac standpoint with echo July 2014 showing chronic systolic CHF ef 88%  Hopwever, patient and neighbor note that past 1 year he has had progressive decline in functional status with associated fatigue and worsening dyspnea on exertion. Neighbor states he cannot walk 30 feet without stopping. AT home he struggles to get trhough ADLS due to dyspnea. IN fact he was brought in by wheel chair from office parking lot to our office. He has associated cough that is also worse past few months and is assocciated with sputum production. Dyspnea is rated as moderate-severe while cough is at moderate  There is no associated orthopnea or pnd.   Noted these symptoms are worsening despite baseline spiriva    has a past medical history of Coronary artery disease; COPD (chronic obstructive pulmonary disease); Hyperlipidemia; Hypertension; Long-term (current) use of anticoagulants; LVH (left ventricular hypertrophy); Atrial fibrillation; Tobacco abuse; and Systolic heart failure.   reports that he has been smoking Cigarettes.  He has a 48.75 pack-year smoking history. He has never used smokeless tobacco.   has past surgical history that includes Coronary stent placement (12/2011);  Cholecystectomy; and Hernia repair.  Immunization History  Administered Date(s) Administered  . Influenza Split 08/29/2014    History   Social History  . Marital Status: Widowed    Spouse Name: N/A    Number of Children: N/A  . Years of Education: N/A   Occupational History  . Not on file.   Social History Main Topics  . Smoking status: Current Every Day Smoker -- 0.75 packs/day for 65 years    Types: Cigarettes  . Smokeless tobacco: Never Used  . Alcohol Use: No  . Drug Use: No  . Sexual Activity: Not Currently   Other Topics Concern  . Not on file   Social History Narrative  . No narrative on file    Allergies  Allergen Reactions  . Indomethacin     unknown  . Lipitor [Atorvastatin Calcium]     Leg pain  . Pravachol     Leg pain  . Procardia [Nifedipine]   . Zaroxolyn [Metolazone]     Current outpatient prescriptions:allopurinol (ZYLOPRIM) 100 MG tablet, as directed. , Disp: , Rfl: ;  ALPRAZolam (XANAX) 0.25 MG tablet, TAKE 1 TABLET TWICE DAILY AS NEEDED, Disp: 120 tablet, Rfl: 1;  aspirin 81 MG tablet, Take 81 mg by mouth daily.  , Disp: , Rfl: ;  calcium carbonate 200 MG capsule, Take 500 mg by mouth daily.  , Disp: , Rfl: ;  cholecalciferol (VITAMIN D) 400 UNITS TABS, Take by mouth., Disp: , Rfl:  clopidogrel (PLAVIX) 75 MG tablet, TAKE 1 TABLET EVERY DAY WITH BREAKFAST, Disp: 90 tablet, Rfl:  0;  CRESTOR 10 MG tablet, TAKE 1 TABLET (10 MG TOTAL) BY MOUTH DAILY., Disp: 90 tablet, Rfl: 0;  fish oil-omega-3 fatty acids 1000 MG capsule, Take 2 g by mouth daily.  , Disp: , Rfl: ;  furosemide (LASIX) 40 MG tablet, Take 1.5 tablets (60 mg total) by mouth daily., Disp: 135 tablet, Rfl: 3 guaiFENesin (MUCINEX) 600 MG 12 hr tablet, Take 1,200 mg by mouth 2 (two) times daily as needed.  , Disp: , Rfl: ;  isosorbide mononitrate (IMDUR) 60 MG 24 hr tablet, TAKE 1 TABLET (60 MG TOTAL) BY MOUTH 2 (TWO) TIMES DAILY., Disp: 180 tablet, Rfl: 1;  levalbuterol (XOPENEX HFA) 45 MCG/ACT  inhaler, Inhale 1-2 puffs into the lungs every 4 (four) hours as needed. For shortness of breath, Disp: , Rfl:  meclizine (ANTIVERT) 25 MG tablet, TAKE 1 TABLET (25 MG TOTAL) BY MOUTH 3 (THREE) TIMES DAILY AS NEEDED FOR DIZZINESS. FOR DIZZINESS, Disp: 30 tablet, Rfl: 3;  metoprolol (LOPRESSOR) 50 MG tablet, TAKE 1 TABLET (50 MG TOTAL) BY MOUTH 2 (TWO) TIMES DAILY., Disp: 180 tablet, Rfl: 3;  nitroGLYCERIN (NITROSTAT) 0.4 MG SL tablet, Place 1 tablet (0.4 mg total) under the tongue every 5 (five) minutes as needed., Disp: 25 tablet, Rfl: 3 potassium chloride SA (K-DUR,KLOR-CON) 20 MEQ tablet, Take 1 tablet (20 mEq total) by mouth daily., Disp: 90 tablet, Rfl: 3;  PROAIR HFA 108 (90 BASE) MCG/ACT inhaler, INHALE 2 PUFFS INTO LUNGS EVERY 6 HOURS AS NEEDED FOR WHEEZING, Disp: 8.5 each, Rfl: 5;  RANEXA 500 MG 12 hr tablet, TAKE 1 TABLET TWICE DAILY, Disp: 180 tablet, Rfl: 3;  sodium bicarbonate 650 MG tablet, Take 650 mg by mouth daily. , Disp: , Rfl:  SPIRIVA HANDIHALER 18 MCG inhalation capsule, PLACE 1 CAPSULE (18 MCG TOTAL) INTO INHALER AND INHALE DAILY., Disp: 90 capsule, Rfl: 3;  temazepam (RESTORIL) 30 MG capsule, TAKE ONE CAPSULE BY MOUTH AT BEDTIME AS NEEDED, Disp: 90 capsule, Rfl: 1;  THEO-24 300 MG 24 hr capsule, TAKE 1 CAPSULE (300 MG TOTAL) BY MOUTH DAILY., Disp: 30 capsule, Rfl: 1;  traMADol (ULTRAM) 50 MG tablet, , Disp: , Rfl:  valsartan-hydrochlorothiazide (DIOVAN-HCT) 160-25 MG per tablet, Take 1 tablet by mouth daily., Disp: 90 tablet, Rfl: 3;  predniSONE (DELTASONE) 10 MG tablet, , Disp: , Rfl:     Review of Systems  Constitutional: Negative for fever and unexpected weight change.  HENT: Negative for congestion, dental problem, ear pain, nosebleeds, postnasal drip, rhinorrhea, sinus pressure, sneezing, sore throat and trouble swallowing.   Eyes: Negative for redness and itching.  Respiratory: Positive for cough and shortness of breath. Negative for chest tightness and wheezing.     Cardiovascular: Negative for palpitations and leg swelling.  Gastrointestinal: Negative for nausea and vomiting.  Genitourinary: Negative for dysuria.  Musculoskeletal: Positive for joint swelling.  Skin: Negative for rash.  Neurological: Negative for headaches.  Hematological: Does not bruise/bleed easily.  Psychiatric/Behavioral: Negative for dysphoric mood. The patient is not nervous/anxious.        Objective:   Physical Exam  Nursing note and vitals reviewed. Constitutional: He is oriented to person, place, and time. He appears well-developed and well-nourished. No distress.  Body mass index is 28.05 kg/(m^2). Deconditioned male - sitting in wheel chair  HENT:  Head: Normocephalic and atraumatic.  Right Ear: External ear normal.  Left Ear: External ear normal.  Mouth/Throat: Oropharynx is clear and moist. No oropharyngeal exudate.  Eyes: Conjunctivae and EOM are normal. Pupils are equal,  round, and reactive to light. Right eye exhibits no discharge. Left eye exhibits no discharge. No scleral icterus.  Neck: Normal range of motion. Neck supple. No JVD present. No tracheal deviation present. No thyromegaly present.  Cardiovascular: Normal rate, regular rhythm and intact distal pulses.  Exam reveals no gallop and no friction rub.   No murmur heard. Pulmonary/Chest: Effort normal and breath sounds normal. No respiratory distress. He has no wheezes. He has no rales. He exhibits no tenderness.  Poor air movement barrell chest  Abdominal: Soft. Bowel sounds are normal. He exhibits no distension and no mass. There is no tenderness. There is no rebound and no guarding.  Musculoskeletal: Normal range of motion. He exhibits no edema and no tenderness.  Lymphadenopathy:    He has no cervical adenopathy.  Neurological: He is alert and oriented to person, place, and time. He has normal reflexes. No cranial nerve deficit. Coordination normal.  Skin: Skin is warm and dry. No rash noted. He is  not diaphoretic. No erythema. No pallor.  Dry skin  Psychiatric: He has a normal mood and affect. His behavior is normal. Judgment and thought content normal.    Filed Vitals:   09/09/14 1616  BP: 148/88  Pulse: 60  Height: 5\' 9"  (1.753 m)  Weight: 190 lb (86.183 kg)  SpO2: 98%         Assessment & Plan:  #COPD =- he appearst to have severe or very severe copd based on story and exam - suspect significant deconditioning as well  - high symptom burder  PLAN Continue spiriva daily Add/STart advair disc 100/50 , 1 puff twice daily Have CXR 2 view next week or two Have PFT test next week or two Do COPD CAT score at followup  Followup   - finish above test and return to see my NP Tammy next week or two - to review results and optimize therapy  - COPD CAT Score at followup   Dr. Brand Males, M.D., [facility]C.P Pulmonary and Critical Care Medicine Staff Physician [facility] Pager: [phone], If no answer or between  15:00h - 7:00h: call [phone]  09/12/2014 12:10 AM

## 2014-09-20 ENCOUNTER — Ambulatory Visit (INDEPENDENT_AMBULATORY_CARE_PROVIDER_SITE_OTHER)
Admission: RE | Admit: 2014-09-20 | Discharge: 2014-09-20 | Disposition: A | Discharge status: Home / Self Care | Payer: Medicare Other | Source: Ambulatory Visit | Attending: Internal Medicine | Admitting: Internal Medicine

## 2014-09-20 DIAGNOSIS — J449 Chronic obstructive pulmonary disease, unspecified: Secondary | ICD-10-CM

## 2014-09-23 ENCOUNTER — Ambulatory Visit (HOSPITAL_COMMUNITY)
Admission: RE | Admit: 2014-09-23 | Discharge: 2014-09-23 | Disposition: A | Discharge status: Home / Self Care | Payer: Medicare Other | Source: Ambulatory Visit | Attending: Internal Medicine | Admitting: Internal Medicine

## 2014-09-23 ENCOUNTER — Ambulatory Visit (INDEPENDENT_AMBULATORY_CARE_PROVIDER_SITE_OTHER): Payer: Medicare Other | Admitting: Adult Health

## 2014-09-23 VITALS — BP 138/66 | HR 61 | Temp 97.0°F | Ht 66.0 in | Wt 190.0 lb

## 2014-09-23 DIAGNOSIS — J441 Chronic obstructive pulmonary disease with (acute) exacerbation: Secondary | ICD-10-CM

## 2014-09-23 DIAGNOSIS — J439 Emphysema, unspecified: Secondary | ICD-10-CM

## 2014-09-23 DIAGNOSIS — R0603 Acute respiratory distress: Principal | ICD-10-CM

## 2014-09-23 DIAGNOSIS — J449 Chronic obstructive pulmonary disease, unspecified: Secondary | ICD-10-CM

## 2014-09-23 LAB — PULMONARY FUNCTION TEST
FEF 25-75 POST: 0.24 L/s
FEF 25-75 Pre: 0.21 L/sec
FEF2575-%CHANGE-POST: 14 %
FEF2575-%PRED-PRE: 12 %
FEF2575-%Pred-Post: 14 %
FEV1-%Change-Post: 6 %
FEV1-%PRED-POST: 33 %
FEV1-%PRED-PRE: 31 %
FEV1-Post: 0.8 L
FEV1-Pre: 0.75 L
FEV1FVC-%CHANGE-POST: 3 %
FEV1FVC-%Pred-Pre: 58 %
FEV6-%CHANGE-POST: 4 %
FEV6-%Pred-Post: 48 %
FEV6-%Pred-Pre: 46 %
FEV6-Post: 1.54 L
FEV6-Pre: 1.47 L
FEV6FVC-%Change-Post: 1 %
FEV6FVC-%Pred-Post: 90 %
FEV6FVC-%Pred-Pre: 88 %
FVC-%Change-Post: 2 %
FVC-%PRED-POST: 54 %
FVC-%Pred-Pre: 52 %
FVC-Post: 1.84 L
FVC-Pre: 1.79 L
PRE FEV1/FVC RATIO: 42 %
Post FEV1/FVC ratio: 43 %
Post FEV6/FVC ratio: 84 %
Pre FEV6/FVC Ratio: 82 %
RV % pred: 89 %
RV: 2.16 L
TLC % pred: 64 %
TLC: 4.01 L

## 2014-09-23 MED ORDER — TIOTROPIUM BROMIDE MONOHYDRATE 18 MCG IN CAPS: ORAL_CAPSULE | RESPIRATORY_TRACT | Status: DC

## 2014-09-23 MED ORDER — ALBUTEROL SULFATE (2.5 MG/3ML) 0.083% IN NEBU
2.5000 mg | INHALATION_SOLUTION | Freq: Once | RESPIRATORY_TRACT | Status: AC
  Administered 2014-09-23: 2.5 mg via RESPIRATORY_TRACT

## 2014-09-23 MED ORDER — FLUTICASONE-SALMETEROL 100-50 MCG/DOSE IN AEPB: 1.0000 | INHALATION_SPRAY | Freq: Two times a day (BID) | RESPIRATORY_TRACT | Status: DC

## 2014-09-23 NOTE — Patient Instructions (Addendum)
You have Severe COPD  Most important goal is to quit smoking  Continue on Advair and Spiriva  Wear Oxygen 2.5 l/m At bedtime   Please contact office for sooner follow up if symptoms do not improve or worsen or seek emergency care  Follow up Dr. Chase Caller in 6-8 weeks and As needed

## 2014-09-29 NOTE — Assessment & Plan Note (Signed)
Severe COPD  Smoking cessation   Plan  Most important goal is to quit smoking  Continue on Advair and Spiriva  Wear Oxygen 2.5 l/m At bedtime   Please contact office for sooner follow up if symptoms do not improve or worsen or seek emergency care  Follow up Dr. Chase Caller in 6-8 weeks and As needed

## 2014-09-29 NOTE — Progress Notes (Signed)
Subjective:    Patient ID: Sean Figueroa, male    DOB: 07/10/1933, 80 y.o.   MRN: [id number] IOV 09/09/2014 PCP [name], MD Cards - Dr Mare Ferrari   HPI   IOV 09/09/2014  Chief Complaint  Patient presents with  . Pulmonary Consult    Pt referred by Dr. Mellody Drown for COPD.    80 year old male accompanied by his neighbor  He had a history of a remote apical myocardial infarction. He developed crescendo angina in March 2013. He underwent high risk drug-eluting stent placement to the circumflex and a drug-eluting stent to the left main with support of the impellla device by Dr. Burt Knack. He is now on long-term dual antiplatelet therapy with aspirin and Plavix. Most recent visit 07/18/14 with cardiology felt to be stable from a cardiac standpoint with echo July 2014 showing chronic systolic CHF ef 16%  Hopwever, patient and neighbor note that past 1 year he has had progressive decline in functional status with associated fatigue and worsening dyspnea on exertion. Neighbor states he cannot walk 30 feet without stopping. AT home he struggles to get trhough ADLS due to dyspnea. IN fact he was brought in by wheel chair from office parking lot to our office. He has associated cough that is also worse past few months and is assocciated with sputum production. Dyspnea is rated as moderate-severe while cough is at moderate  There is no associated orthopnea or pnd.   Noted these symptoms are worsening despite baseline spiriva   09/23/14 Follow up COPD  Patient returns for a follow-up visit Patient was seen 2 weeks ago for pulmonary consult for COPD He underwent a PFT that showed FEV1 31%, ratio 42, no sign  bronchodilator response, FVC 52%, diffusing capacity was unable to be completed. +Air trapping .  He remains on Advair and Spiriva .  He continues to smoke, advised on smoking cessation  On O2 at At bedtime   CXR last ov showed Chronic left basilar process likely small effusion with  associatedatelectasis/scarring. Has hx of small left effusion on previous cxr and CT from 2014  CAT score today 25  He denies any chest pain, orthopnea, PND or leg swelling    Past Medical History  Diagnosis Date  . 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 Has chronic angina; S/P DES to the LCX and DES to the L MAIN 12/2011. Now on Plavix and ASA.   Marland Kitchen COPD (chronic obstructive pulmonary disease)     Uses 2.5L O2 nightly  . Hyperlipidemia   . Hypertension   . Long-term (current) use of anticoagulants     For hx of LV apical aneurysm  . LVH (left ventricular hypertrophy)   . Atrial fibrillation     Unclear history  . Tobacco abuse   . Systolic heart failure     EF is 35% per cath 12/2011      Review of Systems  Constitutional:   No  weight loss, night sweats,  Fevers, chills, +fatigue, or  lassitude.  HEENT:   No headaches,  Difficulty swallowing,  Tooth/dental problems, or  Sore throat,                No sneezing, itching, ear ache, nasal congestion, post nasal drip,   CV:  No chest pain,  Orthopnea, PND, swelling in lower extremities, anasarca, dizziness, palpitations, syncope.   GI  No heartburn, indigestion, abdominal pain, nausea, vomiting, diarrhea, change in bowel habits, loss of appetite, bloody stools.   Resp:  No chest wall deformity  Skin: no rash or lesions.  GU: no dysuria, change in color of urine, no urgency or frequency.  No flank pain, no hematuria   MS:  No joint pain or swelling.  No decreased range of motion.  No back pain.  Psych:  No change in mood or affect. No depression or anxiety.  No memory loss.         Objective:   Physical Exam   GEN: A/Ox3; pleasant , NAD elderly   HEENT:  [place]/AT,  EACs-clear, TMs-wnl, NOSE-clear, THROAT-clear, no lesions, no postnasal drip or exudate noted.   NECK:  Supple w/ fair ROM; no JVD; normal carotid  impulses w/o bruits; no thyromegaly or nodules palpated; no lymphadenopathy.  RESP  Decreased BS in bases no accessory muscle use, no dullness to percussion  CARD:  RRR, no m/r/g  , no peripheral edema, pulses intact, no cyanosis or clubbing.  GI:   Soft & nt; nml bowel sounds; no organomegaly or masses detected.  Musco: Warm bil, no deformities or joint swelling noted.   Neuro: alert, no focal deficits noted.    Skin: Warm, no lesions or rashes        cXR 09/20/14  Chronic left basilar process likely small effusion with associated atelectasis/scarring.

## 2014-10-06 ENCOUNTER — Inpatient Hospital Stay (HOSPITAL_COMMUNITY)
Admission: EM | Admit: 2014-10-06 | Discharge: 2014-10-08 | DRG: 292 | Disposition: A | Discharge status: Home / Self Care | Payer: Medicare Other | Attending: Cardiology | Admitting: Cardiology

## 2014-10-06 ENCOUNTER — Emergency Department (HOSPITAL_COMMUNITY): Payer: Medicare Other

## 2014-10-06 ENCOUNTER — Encounter (HOSPITAL_COMMUNITY): Payer: Self-pay | Admitting: *Deleted

## 2014-10-06 DIAGNOSIS — I5023 Acute on chronic systolic (congestive) heart failure: Secondary | ICD-10-CM | POA: Diagnosis not present

## 2014-10-06 DIAGNOSIS — Z79899 Other long term (current) drug therapy: Secondary | ICD-10-CM | POA: Diagnosis not present

## 2014-10-06 DIAGNOSIS — I255 Ischemic cardiomyopathy: Secondary | ICD-10-CM | POA: Diagnosis present

## 2014-10-06 DIAGNOSIS — I25119 Atherosclerotic heart disease of native coronary artery with unspecified angina pectoris: Secondary | ICD-10-CM

## 2014-10-06 DIAGNOSIS — R0602 Shortness of breath: Secondary | ICD-10-CM | POA: Diagnosis not present

## 2014-10-06 DIAGNOSIS — I252 Old myocardial infarction: Secondary | ICD-10-CM | POA: Diagnosis not present

## 2014-10-06 DIAGNOSIS — I509 Heart failure, unspecified: Secondary | ICD-10-CM | POA: Insufficient documentation

## 2014-10-06 DIAGNOSIS — Z7982 Long term (current) use of aspirin: Secondary | ICD-10-CM | POA: Diagnosis not present

## 2014-10-06 DIAGNOSIS — I2510 Atherosclerotic heart disease of native coronary artery without angina pectoris: Secondary | ICD-10-CM

## 2014-10-06 DIAGNOSIS — E785 Hyperlipidemia, unspecified: Secondary | ICD-10-CM | POA: Diagnosis present

## 2014-10-06 DIAGNOSIS — Z9981 Dependence on supplemental oxygen: Secondary | ICD-10-CM | POA: Diagnosis not present

## 2014-10-06 DIAGNOSIS — Z7901 Long term (current) use of anticoagulants: Secondary | ICD-10-CM | POA: Diagnosis not present

## 2014-10-06 DIAGNOSIS — L03115 Cellulitis of right lower limb: Secondary | ICD-10-CM | POA: Diagnosis present

## 2014-10-06 DIAGNOSIS — R200 Anesthesia of skin: Secondary | ICD-10-CM

## 2014-10-06 DIAGNOSIS — Z888 Allergy status to other drugs, medicaments and biological substances status: Secondary | ICD-10-CM

## 2014-10-06 DIAGNOSIS — I119 Hypertensive heart disease without heart failure: Secondary | ICD-10-CM

## 2014-10-06 DIAGNOSIS — Z9119 Patient's noncompliance with other medical treatment and regimen: Secondary | ICD-10-CM | POA: Diagnosis present

## 2014-10-06 DIAGNOSIS — I10 Essential (primary) hypertension: Secondary | ICD-10-CM | POA: Diagnosis present

## 2014-10-06 DIAGNOSIS — I480 Paroxysmal atrial fibrillation: Secondary | ICD-10-CM | POA: Diagnosis present

## 2014-10-06 DIAGNOSIS — J449 Chronic obstructive pulmonary disease, unspecified: Secondary | ICD-10-CM

## 2014-10-06 DIAGNOSIS — Z9049 Acquired absence of other specified parts of digestive tract: Secondary | ICD-10-CM | POA: Diagnosis present

## 2014-10-06 DIAGNOSIS — F17210 Nicotine dependence, cigarettes, uncomplicated: Secondary | ICD-10-CM | POA: Diagnosis present

## 2014-10-06 DIAGNOSIS — Z955 Presence of coronary angioplasty implant and graft: Secondary | ICD-10-CM | POA: Diagnosis not present

## 2014-10-06 HISTORY — DX: Personal history of other diseases of the musculoskeletal system and connective tissue: Z87.39

## 2014-10-06 HISTORY — DX: Dependence on supplemental oxygen: Z99.81

## 2014-10-06 HISTORY — DX: Unspecified osteoarthritis, unspecified site: M19.90

## 2014-10-06 HISTORY — DX: Pneumonia, unspecified organism: J18.9

## 2014-10-06 HISTORY — DX: Dorsalgia, unspecified: M54.9

## 2014-10-06 HISTORY — DX: Intracardiac thrombosis, not elsewhere classified: I51.3

## 2014-10-06 HISTORY — DX: Acute myocardial infarction, unspecified: I21.9

## 2014-10-06 HISTORY — DX: Other chronic pain: G89.29

## 2014-10-06 LAB — BASIC METABOLIC PANEL
ANION GAP: 12 (ref 5–15)
BUN: 13 mg/dL (ref 6–23)
CALCIUM: 8.8 mg/dL (ref 8.4–10.5)
CO2: 28 mEq/L (ref 19–32)
Chloride: 102 mEq/L (ref 96–112)
Creatinine, Ser: 0.87 mg/dL (ref 0.50–1.35)
GFR calc non Af Amer: 80 mL/min — ABNORMAL LOW (ref >90)
GLUCOSE: 109 mg/dL — AB (ref 70–99)
POTASSIUM: 4 meq/L (ref 3.7–5.3)
SODIUM: 142 meq/L (ref 137–147)

## 2014-10-06 LAB — I-STAT TROPONIN, ED: Troponin i, poc: 0.01 ng/mL (ref 0.00–0.08)

## 2014-10-06 LAB — CBC
HCT: 45.7 % (ref 39.0–52.0)
HEMATOCRIT: 43.3 % (ref 39.0–52.0)
HEMOGLOBIN: 15.2 g/dL (ref 13.0–17.0)
Hemoglobin: 14.1 g/dL (ref 13.0–17.0)
MCH: 32.6 pg (ref 26.0–34.0)
MCH: 33.3 pg (ref 26.0–34.0)
MCHC: 32.6 g/dL (ref 30.0–36.0)
MCHC: 33.3 g/dL (ref 30.0–36.0)
MCV: 100.2 fL — AB (ref 78.0–100.0)
MCV: 100 fL (ref 78.0–100.0)
Platelets: 168 10*3/uL (ref 150–400)
Platelets: 165 10*3/uL (ref 150–400)
RBC: 4.32 MIL/uL (ref 4.22–5.81)
RBC: 4.57 MIL/uL (ref 4.22–5.81)
RDW: 14.3 % (ref 11.5–15.5)
RDW: 14.2 % (ref 11.5–15.5)
WBC: 7 10*3/uL (ref 4.0–10.5)
WBC: 8.7 10*3/uL (ref 4.0–10.5)

## 2014-10-06 LAB — CREATININE, SERUM
CREATININE: 0.8 mg/dL (ref 0.50–1.35)
GFR calc Af Amer: >90 mL/min (ref >90)
GFR calc non Af Amer: 83 mL/min — ABNORMAL LOW (ref >90)

## 2014-10-06 LAB — PRO B NATRIURETIC PEPTIDE: Pro B Natriuretic peptide (BNP): 1522 pg/mL — ABNORMAL HIGH (ref 0–450)

## 2014-10-06 LAB — TROPONIN I: Troponin I: <0.3 ng/mL (ref <0.30)

## 2014-10-06 LAB — MRSA PCR SCREENING: MRSA by PCR: NEGATIVE

## 2014-10-06 MED ORDER — SODIUM CHLORIDE 0.9 % IJ SOLN: 3.0000 mL | INTRAMUSCULAR | Status: DC | PRN

## 2014-10-06 MED ORDER — ISOSORBIDE MONONITRATE ER 60 MG PO TB24
60.0000 mg | ORAL_TABLET | Freq: Two times a day (BID) | ORAL | Status: DC
  Administered 2014-10-06 – 2014-10-08 (×4): 60 mg via ORAL
  Filled 2014-10-06 (×5): qty 1

## 2014-10-06 MED ORDER — RANOLAZINE ER 500 MG PO TB12
500.0000 mg | ORAL_TABLET | Freq: Two times a day (BID) | ORAL | Status: DC
  Administered 2014-10-06 – 2014-10-08 (×4): 500 mg via ORAL
  Filled 2014-10-06 (×5): qty 1

## 2014-10-06 MED ORDER — HEPARIN SODIUM (PORCINE) 5000 UNIT/ML IJ SOLN
5000.0000 [IU] | Freq: Three times a day (TID) | INTRAMUSCULAR | Status: DC
  Administered 2014-10-06 – 2014-10-08 (×5): 5000 [IU] via SUBCUTANEOUS
  Filled 2014-10-06 (×8): qty 1

## 2014-10-06 MED ORDER — TIOTROPIUM BROMIDE MONOHYDRATE 18 MCG IN CAPS
18.0000 ug | ORAL_CAPSULE | Freq: Every day | RESPIRATORY_TRACT | Status: DC
  Administered 2014-10-07 – 2014-10-08 (×2): 18 ug via RESPIRATORY_TRACT
  Filled 2014-10-06: qty 5

## 2014-10-06 MED ORDER — ALPRAZOLAM 0.25 MG PO TABS
0.2500 mg | ORAL_TABLET | Freq: Two times a day (BID) | ORAL | Status: DC | PRN
  Administered 2014-10-06 – 2014-10-08 (×2): 0.25 mg via ORAL
  Filled 2014-10-06 (×2): qty 1

## 2014-10-06 MED ORDER — THEOPHYLLINE ER 300 MG PO CP24
300.0000 mg | ORAL_CAPSULE | Freq: Every day | ORAL | Status: DC
  Administered 2014-10-07 – 2014-10-08 (×2): 300 mg via ORAL
  Filled 2014-10-06 (×4): qty 1

## 2014-10-06 MED ORDER — ROSUVASTATIN CALCIUM 10 MG PO TABS
10.0000 mg | ORAL_TABLET | Freq: Every day | ORAL | Status: DC
  Administered 2014-10-07 – 2014-10-08 (×2): 10 mg via ORAL
  Filled 2014-10-06 (×2): qty 1

## 2014-10-06 MED ORDER — ONDANSETRON HCL 4 MG/2ML IJ SOLN: 4.0000 mg | Freq: Four times a day (QID) | INTRAMUSCULAR | Status: DC | PRN

## 2014-10-06 MED ORDER — OMEGA-3 FATTY ACIDS 1000 MG PO CAPS: 2.0000 g | ORAL_CAPSULE | Freq: Every day | ORAL | Status: DC

## 2014-10-06 MED ORDER — CALCIUM CARBONATE 200 MG PO CAPS: 500.0000 mg | ORAL_CAPSULE | Freq: Every day | ORAL | Status: DC

## 2014-10-06 MED ORDER — CLOPIDOGREL BISULFATE 75 MG PO TABS
75.0000 mg | ORAL_TABLET | Freq: Every day | ORAL | Status: DC
  Administered 2014-10-07 – 2014-10-08 (×2): 75 mg via ORAL
  Filled 2014-10-06 (×2): qty 1

## 2014-10-06 MED ORDER — GUAIFENESIN ER 600 MG PO TB12
1200.0000 mg | ORAL_TABLET | Freq: Two times a day (BID) | ORAL | Status: DC | PRN
  Filled 2014-10-06: qty 2

## 2014-10-06 MED ORDER — TEMAZEPAM 15 MG PO CAPS: 30.0000 mg | ORAL_CAPSULE | Freq: Every evening | ORAL | Status: DC | PRN

## 2014-10-06 MED ORDER — SODIUM BICARBONATE 650 MG PO TABS
650.0000 mg | ORAL_TABLET | Freq: Every day | ORAL | Status: DC
  Administered 2014-10-07 – 2014-10-08 (×2): 650 mg via ORAL
  Filled 2014-10-06 (×2): qty 1

## 2014-10-06 MED ORDER — ALBUTEROL SULFATE HFA 108 (90 BASE) MCG/ACT IN AERS: 2.0000 | INHALATION_SPRAY | Freq: Four times a day (QID) | RESPIRATORY_TRACT | Status: DC | PRN

## 2014-10-06 MED ORDER — NITROGLYCERIN 0.4 MG SL SUBL: 0.4000 mg | SUBLINGUAL_TABLET | SUBLINGUAL | Status: DC | PRN

## 2014-10-06 MED ORDER — ALLOPURINOL 100 MG PO TABS
100.0000 mg | ORAL_TABLET | Freq: Every day | ORAL | Status: DC
  Administered 2014-10-07 – 2014-10-08 (×2): 100 mg via ORAL
  Filled 2014-10-06 (×3): qty 1

## 2014-10-06 MED ORDER — MOMETASONE FURO-FORMOTEROL FUM 100-5 MCG/ACT IN AERO
2.0000 | INHALATION_SPRAY | Freq: Two times a day (BID) | RESPIRATORY_TRACT | Status: DC
  Administered 2014-10-06 – 2014-10-08 (×4): 2 via RESPIRATORY_TRACT
  Filled 2014-10-06: qty 8.8

## 2014-10-06 MED ORDER — MECLIZINE HCL 25 MG PO TABS
25.0000 mg | ORAL_TABLET | Freq: Three times a day (TID) | ORAL | Status: DC | PRN
  Filled 2014-10-06: qty 1

## 2014-10-06 MED ORDER — CHLORHEXIDINE GLUCONATE 0.12 % MT SOLN
15.0000 mL | Freq: Two times a day (BID) | OROMUCOSAL | Status: DC
  Administered 2014-10-06 – 2014-10-08 (×4): 15 mL via OROMUCOSAL
  Filled 2014-10-06 (×7): qty 15

## 2014-10-06 MED ORDER — CETYLPYRIDINIUM CHLORIDE 0.05 % MT LIQD
7.0000 mL | Freq: Two times a day (BID) | OROMUCOSAL | Status: DC
  Administered 2014-10-07: 7 mL via OROMUCOSAL

## 2014-10-06 MED ORDER — ALBUTEROL SULFATE (2.5 MG/3ML) 0.083% IN NEBU: 2.5000 mg | INHALATION_SOLUTION | Freq: Four times a day (QID) | RESPIRATORY_TRACT | Status: DC | PRN

## 2014-10-06 MED ORDER — FUROSEMIDE 10 MG/ML IJ SOLN
80.0000 mg | Freq: Two times a day (BID) | INTRAMUSCULAR | Status: DC
Start: 2014-10-06 — End: 2014-10-07
  Administered 2014-10-06: 80 mg via INTRAVENOUS
  Filled 2014-10-06 (×4): qty 8

## 2014-10-06 MED ORDER — CALCIUM CARBONATE 1250 (500 CA) MG PO TABS
1.0000 | ORAL_TABLET | Freq: Every day | ORAL | Status: DC
  Administered 2014-10-07 – 2014-10-08 (×2): 500 mg via ORAL
  Filled 2014-10-06 (×3): qty 1

## 2014-10-06 MED ORDER — TRAMADOL HCL 50 MG PO TABS: 50.0000 mg | ORAL_TABLET | Freq: Four times a day (QID) | ORAL | Status: DC | PRN

## 2014-10-06 MED ORDER — METOPROLOL TARTRATE 50 MG PO TABS
50.0000 mg | ORAL_TABLET | Freq: Two times a day (BID) | ORAL | Status: DC
  Administered 2014-10-06 – 2014-10-08 (×4): 50 mg via ORAL
  Filled 2014-10-06 (×5): qty 1

## 2014-10-06 MED ORDER — SODIUM CHLORIDE 0.9 % IV SOLN: 250.0000 mL | INTRAVENOUS | Status: DC | PRN

## 2014-10-06 MED ORDER — SODIUM CHLORIDE 0.9 % IJ SOLN
3.0000 mL | Freq: Two times a day (BID) | INTRAMUSCULAR | Status: DC
  Administered 2014-10-06 – 2014-10-08 (×4): 3 mL via INTRAVENOUS

## 2014-10-06 MED ORDER — ASPIRIN EC 81 MG PO TBEC
81.0000 mg | DELAYED_RELEASE_TABLET | Freq: Every day | ORAL | Status: DC
  Filled 2014-10-06 (×2): qty 1

## 2014-10-06 MED ORDER — POTASSIUM CHLORIDE CRYS ER 20 MEQ PO TBCR
20.0000 meq | EXTENDED_RELEASE_TABLET | Freq: Every day | ORAL | Status: DC
  Filled 2014-10-06: qty 1

## 2014-10-06 MED ORDER — OMEGA-3-ACID ETHYL ESTERS 1 G PO CAPS
1.0000 g | ORAL_CAPSULE | Freq: Every day | ORAL | Status: DC
  Administered 2014-10-07 – 2014-10-08 (×2): 1 g via ORAL
  Filled 2014-10-06 (×3): qty 1

## 2014-10-06 MED ORDER — VITAMIN D 1000 UNITS PO TABS
1000.0000 [IU] | ORAL_TABLET | Freq: Every day | ORAL | Status: DC
  Administered 2014-10-07 – 2014-10-08 (×2): 1000 [IU] via ORAL
  Filled 2014-10-06 (×2): qty 1

## 2014-10-06 MED ORDER — CEPHALEXIN 500 MG PO CAPS
500.0000 mg | ORAL_CAPSULE | Freq: Three times a day (TID) | ORAL | Status: DC
  Administered 2014-10-06 – 2014-10-08 (×6): 500 mg via ORAL
  Filled 2014-10-06 (×8): qty 1

## 2014-10-06 MED ORDER — ACETAMINOPHEN 325 MG PO TABS: 650.0000 mg | ORAL_TABLET | ORAL | Status: DC | PRN

## 2014-10-06 NOTE — ED Provider Notes (Signed)
CSN: [id number]     Arrival date & time 10/06/14  0930 History   First MD Initiated Contact with Patient 10/06/14 [phone]     Chief Complaint  Patient presents with  . Arm Pain  . Shortness of Breath   HPI  Patient is a 80 year old male who presents to the emergency room with left arm numbness and shortness of breath. Patient states that he has been having intermittent left arm numbness and tingling for the past month. It usually goes away on its own. Patient states that when he woke up this morning it was severe. He also felt that his shortness of breath was considerably worse. He tried taking 2 nitroglycerin without any relief. Patient states that he has a history of multiple stents in his heart, and coronary artery disease. Patient was trying to hold out to go to see Dr. Mare Ferrari in the office. Patient felt that given his worsening shortness of breath and his worsening left arm numbness that he needed to come here instead.  Past Medical History  Diagnosis Date  . 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 Has chronic angina; S/P DES to the LCX and DES to the L MAIN 12/2011. Now on Plavix and ASA.   Marland Kitchen COPD (chronic obstructive pulmonary disease)     Uses 2.5L O2 nightly  . Hyperlipidemia   . Hypertension   . Long-term (current) use of anticoagulants     For hx of LV apical aneurysm  . LVH (left ventricular hypertrophy)   . Atrial fibrillation     Unclear history  . Tobacco abuse   . Systolic heart failure     EF is 35% per cath 12/2011   Past Surgical History  Procedure Laterality Date  . Coronary stent placement  12/2011    LCX and L main - drug eluting  . Cholecystectomy    . Hernia repair     Family History  Problem Relation Age of Onset  . Heart disease Mother   . Cerebral palsy Daughter   . Hypertension Mother    History  Substance Use Topics  . Smoking status:  Current Every Day Smoker -- 0.75 packs/day for 65 years    Types: Cigarettes  . Smokeless tobacco: Never Used  . Alcohol Use: No    Review of Systems  Constitutional: Negative for fever, chills and fatigue.  Respiratory: Positive for cough, chest tightness, shortness of breath and wheezing.   Cardiovascular: Negative for chest pain and palpitations.  Gastrointestinal: Negative for nausea, vomiting, abdominal pain, diarrhea and constipation.  Neurological: Positive for syncope and numbness.  All other systems reviewed and are negative.     Allergies  Indomethacin; Lipitor; Pravachol; Procardia; and Zaroxolyn  Home Medications   Prior to Admission medications   Medication Sig Start Date End Date Taking? Authorizing Provider  allopurinol (ZYLOPRIM) 100 MG tablet Take 100 mg by mouth daily.  03/23/12  Yes Historical Provider, MD  ALPRAZolam Duanne Moron) 0.25 MG tablet TAKE 1 TABLET TWICE DAILY AS NEEDED 09/08/14  Yes Darlin Coco, MD  aspirin 81 MG tablet Take 81 mg by mouth daily.     Yes Historical Provider, MD  calcium carbonate 200 MG capsule Take 500 mg by mouth daily.     Yes Historical Provider, MD  clopidogrel (PLAVIX) 75 MG tablet TAKE 1 TABLET EVERY DAY WITH BREAKFAST 08/10/14  Yes Darlin Coco, MD  CRESTOR 10 MG tablet TAKE 1 TABLET (10 MG TOTAL) BY MOUTH DAILY. 08/24/14  Yes Darlin Coco, MD  [employer] D3 1000 UNITS capsule Take 1,000 Units by mouth daily. 09/11/14  Yes Historical Provider, MD  fish oil-omega-3 fatty acids 1000 MG capsule Take 2 g by mouth daily.     Yes Historical Provider, MD  Fluticasone-Salmeterol (ADVAIR DISKUS) 100-50 MCG/DOSE AEPB Inhale 1 puff into the lungs 2 (two) times daily. 09/09/14  Yes Brand Males, MD  furosemide (LASIX) 40 MG tablet Take 1.5 tablets (60 mg total) by mouth daily. 03/24/12  Yes Darlin Coco, MD  guaiFENesin (MUCINEX) 600 MG 12 hr tablet Take 1,200 mg by mouth 2 (two) times daily as needed.     Yes Historical Provider, MD   isosorbide mononitrate (IMDUR) 60 MG 24 hr tablet TAKE 1 TABLET (60 MG TOTAL) BY MOUTH 2 (TWO) TIMES DAILY. 08/05/14  Yes Darlin Coco, MD  meclizine (ANTIVERT) 25 MG tablet TAKE 1 TABLET (25 MG TOTAL) BY MOUTH 3 (THREE) TIMES DAILY AS NEEDED FOR DIZZINESS. FOR DIZZINESS 12/29/13  Yes Darlin Coco, MD  metoprolol (LOPRESSOR) 50 MG tablet TAKE 1 TABLET (50 MG TOTAL) BY MOUTH 2 (TWO) TIMES DAILY. 04/27/14  Yes Darlin Coco, MD  nitroGLYCERIN (NITROSTAT) 0.4 MG SL tablet Place 1 tablet (0.4 mg total) under the tongue every 5 (five) minutes as needed. 08/27/13  Yes Darlin Coco, MD  potassium chloride SA (K-DUR,KLOR-CON) 20 MEQ tablet Take 1 tablet (20 mEq total) by mouth daily. 03/24/12  Yes Darlin Coco, MD  PROAIR HFA 108 (90 BASE) MCG/ACT inhaler INHALE 2 PUFFS INTO LUNGS EVERY 6 HOURS AS NEEDED FOR WHEEZING 07/18/13  Yes Darlin Coco, MD  RANEXA 500 MG 12 hr tablet TAKE 1 TABLET TWICE DAILY 08/25/14  Yes Darlin Coco, MD  sodium bicarbonate 650 MG tablet Take 650 mg by mouth daily.    Yes Historical Provider, MD  temazepam (RESTORIL) 30 MG capsule TAKE ONE CAPSULE BY MOUTH AT BEDTIME AS NEEDED 09/08/14  Yes Darlin Coco, MD  THEO-24 300 MG 24 hr capsule TAKE 1 CAPSULE (300 MG TOTAL) BY MOUTH DAILY. 08/30/13  Yes Darlin Coco, MD  tiotropium (SPIRIVA HANDIHALER) 18 MCG inhalation capsule PLACE 1 CAPSULE (18 MCG TOTAL) INTO INHALER AND INHALE DAILY. 09/23/14  Yes Tammy S Parrett, NP  traMADol (ULTRAM) 50 MG tablet Take 50 mg by mouth every 6 (six) hours as needed for moderate pain.  06/13/14  Yes Historical Provider, MD  valsartan-hydrochlorothiazide (DIOVAN-HCT) 160-25 MG per tablet Take 1 tablet by mouth daily. Patient not taking: Reported on 10/06/2014 03/24/12   Darlin Coco, MD   BP 156/77 mmHg  Pulse 67  Temp(Src) 97.4 F (36.3 C) (Oral)  Resp 23  SpO2 95% Physical Exam  Constitutional: He is oriented to person, place, and time. He appears well-developed and  well-nourished. No distress.  HENT:  Head: Normocephalic and atraumatic.  Mouth/Throat: Oropharynx is clear and moist. No oropharyngeal exudate.  Eyes: Conjunctivae and EOM are normal. Pupils are equal, round, and reactive to light. No scleral icterus.  Neck: Normal range of motion. Neck supple. No JVD present. No thyromegaly present.  Cardiovascular: Normal rate, regular rhythm, normal heart sounds and intact distal pulses.  Exam reveals no gallop and no friction rub.   No murmur heard. 2+ bilateral pitting edema of the legs with some redness and warmth.    Pulmonary/Chest: Effort normal. No respiratory distress. He has no wheezes. He has rales (generalized coarse rales). He exhibits no tenderness.  Abdominal: Soft. Bowel sounds are normal. He exhibits no distension and no mass. There is no tenderness. There is no rebound and no guarding.  Musculoskeletal: Normal range of motion.  Lymphadenopathy:    He has no cervical adenopathy.  Neurological: He is alert and oriented to person, place, and time.  Skin: Skin is warm and dry. He is not diaphoretic.  Psychiatric: He has a normal mood and affect. His behavior is normal. Judgment and thought content normal.  Nursing note and vitals reviewed.   ED Course  Procedures (including critical care time) Labs Review Labs Reviewed  CBC - Abnormal; Notable for the following:    MCV 100.2 (*)    All other components within normal limits  BASIC METABOLIC PANEL - Abnormal; Notable for the following:    Glucose, Bld 109 (*)    GFR calc non Af Amer 80 (*)    All other components within normal limits  PRO B NATRIURETIC PEPTIDE - Abnormal; Notable for the following:    Pro B Natriuretic peptide (BNP) 1522.0 (*)    All other components within normal limits  Randolm Idol, ED    Imaging Review Dg Chest Port 1 View  10/06/2014   CLINICAL DATA:  Arm pain and shortness of breath.  EXAM: PORTABLE CHEST - 1 VIEW  COMPARISON:  09/20/2014  FINDINGS:  The cardiac silhouette is partially obscured without gross interval change. There is new increased pulmonary vascular congestion with increased interstitial densities throughout both lungs. Consolidation in the left lung base has increased, again without evidence of a small left pleural effusion. No pneumothorax is identified.  IMPRESSION: 1. Increased pulmonary vascular congestion and bilateral interstitial densities, which may reflect mild edema. 2. Increased left basilar atelectasis versus consolidation with persistent small left pleural effusion.   Electronically Signed   By: Logan Bores   On: 10/06/2014 10:36     EKG Interpretation   Date/Time:  Thursday October 06 2014 09:31:11 EST Ventricular Rate:  73 PR Interval:  168 QRS Duration: 96 QT Interval:  408 QTC Calculation: 449 R Axis:   -34 Text Interpretation:  Sinus rhythm with frequent Premature ventricular  complexes Possible Left atrial enlargement Left axis deviation Low voltage  QRS Inferior infarct , age undetermined Cannot rule out Anteroseptal  infarct , age undetermined Abnormal ECG Confirmed by Hazle Coca [phone]) on  10/06/2014 9:59:43 AM      MDM   Final diagnoses:  Chronic obstructive pulmonary disease, unspecified COPD, unspecified chronic bronchitis type  Acute on chronic congestive heart failure, unspecified congestive heart failure type  Coronary artery disease involving native coronary artery of native heart with angina pectoris  Left arm numbness   Patient is a 80 year old male who presents to emergency room with left arm numbness and shortness of breath 1 day. Physical exam reveals alert and nontoxic appearing male with coarse crackles in all lung fields. It is also noted that there is bilateral lower extremity edema with some chronic venous stasis changes versus cellulitis. CBC reveals normal white blood cells, and no anemia. BMP is unremarkable. I-STAT troponin is negative. BNP appears to be at baseline.  Chest x-ray reveals moderate edema with vascular congestion and bilateral interstitial densities which is thought to likely be edema. There is a small left pleural effusion. EKG is normal sinus rhythm with multiple PVCs. Believe that this is likely CHF exacerbation versus COPD exacerbation. I have spoken with the cards Master who sent Dr. Mare Ferrari to see the patient. There is a pending  note for an H&P. It appears that cardiology will be admitting the patient at this time. Patient has been seen by and discussed with Dr. Ralene Bathe who agrees with the above workup and plan. Patient to be admitted at this time.    Cherylann Parr, PA-C 10/06/14 [place], MD 10/06/14 1711

## 2014-10-06 NOTE — H&P (Signed)
Patient ID: Sean Figueroa MRN: [id number], DOB/AGE: [birth year]   Admit date: 10/06/2014   Primary Physician: [name], MD Primary Cardiologist: Dr. Mare Ferrari  Pt. Profile:  80 year old Caucasian male with past medical history of CAD with remote apical infarct and the high risk PCI to left main in 3532, chronic systolic heart failure with baseline EF 45-50%, ischemic cardiomyopathy, COPD, hypertension, hyperlipidemia and remote history of apical LV thrombus presented with L arm pain   Problem List  Past Medical History  Diagnosis Date  . 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 Has chronic angina; S/P DES to the LCX and DES to the L MAIN 12/2011. Now on Plavix and ASA.   Marland Kitchen COPD (chronic obstructive pulmonary disease)     Uses 2.5L O2 nightly  . Hyperlipidemia   . Hypertension   . Long-term (current) use of anticoagulants     For hx of LV apical aneurysm  . LVH (left ventricular hypertrophy)   . Atrial fibrillation     Unclear history  . Tobacco abuse   . Systolic heart failure     EF is 35% per cath 12/2011    Past Surgical History  Procedure Laterality Date  . Coronary stent placement  12/2011    LCX and L main - drug eluting  . Cholecystectomy    . Hernia repair       Allergies  Allergies  Allergen Reactions  . Indomethacin     unknown  . Lipitor [Atorvastatin Calcium]     Leg pain  . Pravachol     Leg pain  . Procardia [Nifedipine]   . Zaroxolyn [Metolazone]     HPI  The patient is a 80 year old Caucasian male with past medical history of CAD with remote apical infarct and high risk PCI to left main in 9924, chronic systolic heart failure with baseline EF 45-50%, ischemic cardiomyopathy, COPD, hypertension, hyperlipidemia and remote history of apical LV thrombus. According to the patient, he was on Coumadin at one point for LV thrombus, however has  since switched to aspirin and Plavix since high risk left main and left circumflex stent in March 2013. He states he has not been very compliant with daily weight, salt and fluid restriction. He tries to take his Lasix every day, however occasionally missed a few doses. He missed his Lasix dose yesterday as well. He denies any recent chest pain, however has been noticing some left sided arm pain and tingling sensation for the last several weeks. He states he had similar L arm pain along with chest pain during initial heart attack 40 yrs ago. Initially the left arm pain would last no more than a minute each time, however the frequency has been getting worse. He denies any exacerbating or alleviating factors. He has not been very active at home and spend most of time sitting in front of the TV. He has been noticing increasing lower extremity edema. He has chronic orthopnea and has intermittent paroxysmal nocturnal dyspnea.  Patient presented to [facility] in the morning of 10/06/2014 with complaint of left arm pain and shortness of breath. According to the patient, the left arm pain was the main concern at this time. Initial CBC and BMP were normal. Creatinine 0.87. ProBNP was markedly elevated at 1522. Point-of-care troponin negative. Chest x-ray was concerning for mild pulmonary edema with left basilar pleural effusion. EKG  showed normal sinus rhythm with PVCs, however no significant ST-T wave changes. Cardiology has been consulted for heart failure.  Home Medications  Prior to Admission medications   Medication Sig Start Date End Date Taking? Authorizing Provider  allopurinol (ZYLOPRIM) 100 MG tablet Take 100 mg by mouth daily.  03/23/12  Yes Historical Provider, MD  ALPRAZolam Duanne Moron) 0.25 MG tablet TAKE 1 TABLET TWICE DAILY AS NEEDED 09/08/14  Yes Darlin Coco, MD  aspirin 81 MG tablet Take 81 mg by mouth daily.     Yes Historical Provider, MD  calcium carbonate 200 MG capsule Take 500 mg by  mouth daily.     Yes Historical Provider, MD  clopidogrel (PLAVIX) 75 MG tablet TAKE 1 TABLET EVERY DAY WITH BREAKFAST 08/10/14  Yes Darlin Coco, MD  CRESTOR 10 MG tablet TAKE 1 TABLET (10 MG TOTAL) BY MOUTH DAILY. 08/24/14  Yes Darlin Coco, MD  [employer]S D3 1000 UNITS capsule Take 1,000 Units by mouth daily. 09/11/14  Yes Historical Provider, MD  fish oil-omega-3 fatty acids 1000 MG capsule Take 2 g by mouth daily.     Yes Historical Provider, MD  Fluticasone-Salmeterol (ADVAIR DISKUS) 100-50 MCG/DOSE AEPB Inhale 1 puff into the lungs 2 (two) times daily. 09/09/14  Yes Brand Males, MD  furosemide (LASIX) 40 MG tablet Take 1.5 tablets (60 mg total) by mouth daily. 03/24/12  Yes Darlin Coco, MD  guaiFENesin (MUCINEX) 600 MG 12 hr tablet Take 1,200 mg by mouth 2 (two) times daily as needed.     Yes Historical Provider, MD  isosorbide mononitrate (IMDUR) 60 MG 24 hr tablet TAKE 1 TABLET (60 MG TOTAL) BY MOUTH 2 (TWO) TIMES DAILY. 08/05/14  Yes Darlin Coco, MD  meclizine (ANTIVERT) 25 MG tablet TAKE 1 TABLET (25 MG TOTAL) BY MOUTH 3 (THREE) TIMES DAILY AS NEEDED FOR DIZZINESS. FOR DIZZINESS 12/29/13  Yes Darlin Coco, MD  metoprolol (LOPRESSOR) 50 MG tablet TAKE 1 TABLET (50 MG TOTAL) BY MOUTH 2 (TWO) TIMES DAILY. 04/27/14  Yes Darlin Coco, MD  nitroGLYCERIN (NITROSTAT) 0.4 MG SL tablet Place 1 tablet (0.4 mg total) under the tongue every 5 (five) minutes as needed. 08/27/13  Yes Darlin Coco, MD  potassium chloride SA (K-DUR,KLOR-CON) 20 MEQ tablet Take 1 tablet (20 mEq total) by mouth daily. 03/24/12  Yes Darlin Coco, MD  PROAIR HFA 108 (90 BASE) MCG/ACT inhaler INHALE 2 PUFFS INTO LUNGS EVERY 6 HOURS AS NEEDED FOR WHEEZING 07/18/13  Yes Darlin Coco, MD  RANEXA 500 MG 12 hr tablet TAKE 1 TABLET TWICE DAILY 08/25/14  Yes Darlin Coco, MD  sodium bicarbonate 650 MG tablet Take 650 mg by mouth daily.    Yes Historical Provider, MD  temazepam (RESTORIL) 30 MG capsule TAKE  ONE CAPSULE BY MOUTH AT BEDTIME AS NEEDED 09/08/14  Yes Darlin Coco, MD  THEO-24 300 MG 24 hr capsule TAKE 1 CAPSULE (300 MG TOTAL) BY MOUTH DAILY. 08/30/13  Yes Darlin Coco, MD  tiotropium (SPIRIVA HANDIHALER) 18 MCG inhalation capsule PLACE 1 CAPSULE (18 MCG TOTAL) INTO INHALER AND INHALE DAILY. 09/23/14  Yes Tammy S Parrett, NP  traMADol (ULTRAM) 50 MG tablet Take 50 mg by mouth every 6 (six) hours as needed for moderate pain.  06/13/14  Yes Historical Provider, MD  valsartan-hydrochlorothiazide (DIOVAN-HCT) 160-25 MG per tablet Take 1 tablet by mouth daily. Patient not taking: Reported on 10/06/2014 03/24/12   Darlin Coco, MD    Family History  Family History  Problem Relation Age of Onset  . Heart  disease Mother   . Cerebral palsy Daughter     Social History  History   Social History  . Marital Status: Widowed    Spouse Name: N/A    Number of Children: N/A  . Years of Education: N/A   Occupational History  . Not on file.   Social History Main Topics  . Smoking status: Current Every Day Smoker -- 0.75 packs/day for 65 years    Types: Cigarettes  . Smokeless tobacco: Never Used  . Alcohol Use: No  . Drug Use: No  . Sexual Activity: Not Currently   Other Topics Concern  . Not on file   Social History Narrative     Review of Systems General:  No chills, fever, night sweats or weight changes.  Cardiovascular:  No chest pain, palpitations. L arm pain,  dyspnea on exertion chronic, on home O2. Chronic orthopnea, edema, paroxysmal nocturnal dyspnea Dermatological: No rash. R lower leg redness Respiratory: +cough with thick yellow phlegm, dyspnea Urologic: No hematuria, dysuria Abdominal:   No nausea, vomiting, diarrhea, bright red blood per rectum, melena, or hematemesis Neurologic:  No visual changes, wkns, changes in mental status. All other systems reviewed and are otherwise negative except as noted above.  Physical Exam  Blood pressure 156/77, pulse  67, temperature 97.4 F (36.3 C), temperature source Oral, resp. rate 23, SpO2 95 %.  General: Pleasant, NAD Psych: Normal affect. Neuro: Alert and oriented X 3. Moves all extremities spontaneously. HEENT: L eye red  Neck: Supple without bruits or JVD. Lungs:  Resp regular and unlabored, bilateral rhonchi and rale, worse on L side Heart: RRR no s3, s4, or murmurs. Abdomen: Soft, non-tender, non-distended, BS + x 4.  Extremities: No clubbing, cyanosis. DP/PT/Radials 2+ and equal bilaterally. 1-2+ LE edema, bilateral LE redness worse on R side with increasing warmth on R side  Labs  Troponin (Point of Care Test)  Recent Labs  10/06/14 1018  TROPIPOC 0.01   No results for input(s): CKTOTAL, CKMB, TROPONINI in the last 72 hours. Lab Results  Component Value Date   WBC 7.0 10/06/2014   HGB 14.1 10/06/2014   HCT 43.3 10/06/2014   MCV 100.2* 10/06/2014   PLT 168 10/06/2014    Recent Labs Lab 10/06/14 1000  NA 142  K 4.0  CL 102  CO2 28  BUN 13  CREATININE 0.87  CALCIUM 8.8  GLUCOSE 109*   Lab Results  Component Value Date   CHOL 100 07/18/2014   HDL 42.50 07/18/2014   LDLCALC 45 07/18/2014   TRIG 64.0 07/18/2014   No results found for: DDIMER   Radiology/Studies  Dg Chest 2 View  09/20/2014   CLINICAL DATA:  Dyspnea.  Smoker.  EXAM: CHEST  2 VIEW  COMPARISON:  03/29/2013 and 03/15/2013 as well as chest CT 04/05/2013  FINDINGS: Examination demonstrates chronic opacification over the left base compatible with small effusion and associated atelectasis as there may be slight worsening of the effusion. Mild stable cardiomegaly. Remainder of the exam is unchanged.  IMPRESSION: Chronic left basilar process likely small effusion with associated atelectasis/scarring. Possible slight worsening of the effusion.   Electronically Signed   By: Marin Olp M.D.   On: 09/20/2014 11:51   Dg Chest Port 1 View  10/06/2014   CLINICAL DATA:  Arm pain and shortness of breath.  EXAM:  PORTABLE CHEST - 1 VIEW  COMPARISON:  09/20/2014  FINDINGS: The cardiac silhouette is partially obscured without gross interval change. There is new increased  pulmonary vascular congestion with increased interstitial densities throughout both lungs. Consolidation in the left lung base has increased, again without evidence of a small left pleural effusion. No pneumothorax is identified.  IMPRESSION: 1. Increased pulmonary vascular congestion and bilateral interstitial densities, which may reflect mild edema. 2. Increased left basilar atelectasis versus consolidation with persistent small left pleural effusion.   Electronically Signed   By: Logan Bores   On: 10/06/2014 10:36    ECG  NSR with PVCs, no significant ST-T wave changes  Echocardiogram 06/17/2013  LV EF: 40% -  45%  ------------------------------------------------------------ Indications:   Abnormal UFCT 794.30. Cardiomyopathy - ischemic 414.8. Rule LV thrombus.  ------------------------------------------------------------ History:  PMH: Acquired from the patient and from the patient's chart. Paroxysmal atrial fibrillation. Coronary artery disease. Ischemic cardiomyopathy, with an ejection fraction of 50%by echocardiography. The dysfunction is primarily systolic. Chronic obstructive pulmonary disease. PMH:  Myocardial infarction. Risk factors: Current tobacco use.  ------------------------------------------------------------ Study Conclusions  - Left ventricle: The cavity size was mildly dilated. There was moderate concentric hypertrophy. Systolic function was mildly to moderately reduced. The estimated ejection fraction was in the range of 40% to 45%. There is akinesis of the basalinferolateral, inferior, and apical myocardium. There is hypokinesis of the anteroseptal myocardium. Doppler parameters are consistent with abnormal left ventricular relaxation (grade 1 diastolic dysfunction).  Doppler parameters are consistent with high ventricular filling pressure. There was a thrombus. Acoustic contrast opacification revealed a flat (mural), apical anteriorthrombus. - Mitral valve: Mild regurgitation. - Left atrium: The atrium was mildly dilated.    ASSESSMENT AND PLAN  1. Acute on chronic systolic HF  - noncompliant with lasix, dietary restriction  - start on IV lasix 80mg  BID  - per Pharmacy, pt has not been taking his valsartan/HCTZ in last 30 days, will hold for now, however if BP allow and Cr is not elevated, expect restarting ARB later  2. R lower extremity redness  - obtain venous duplex to r/o DVT  - RLE redness worse than L, skin appears warm, will start Keflex to prophylactically treat for cellulitis  3. CAD  - s/p remote apical infarct  - s/p high risk DES to L main and LCx in Mar 2013  - complaining of L arm pain, but no chest pain, (had both during remote infarct) will trend serial trop for now. If symptom persist after diuresis, then can consider ischemic workup  - repeat echo  4.  ischemic cardiomyopathy  - Echo 06/17/2013 EF 81-19%, grade 1 diastolic dysfunction, LV thrombus, mild MR  5. COPD  - on home O2, 2-2.5L  - LFT on 09/23/2014  6. Hypertension 7. hyperlipidemia  8. remote history of apical LV thrombus 9. Red eye, no drainage, afebrile, ?subconjunctival hemorrhage?   Hilbert Corrigan, PA-C 10/06/2014, 12:56 PM

## 2014-10-06 NOTE — ED Notes (Signed)
Attempted report x1. 

## 2014-10-06 NOTE — ED Notes (Signed)
Pt with hx of cad to ED c/o L arm pain and sob for several days.  Took 2 nitro with no relief.

## 2014-10-07 DIAGNOSIS — L03115 Cellulitis of right lower limb: Secondary | ICD-10-CM

## 2014-10-07 DIAGNOSIS — I517 Cardiomegaly: Secondary | ICD-10-CM

## 2014-10-07 LAB — BASIC METABOLIC PANEL
ANION GAP: 11 (ref 5–15)
BUN: 11 mg/dL (ref 6–23)
CHLORIDE: 99 meq/L (ref 96–112)
CO2: 34 mEq/L — ABNORMAL HIGH (ref 19–32)
Calcium: 8.8 mg/dL (ref 8.4–10.5)
Creatinine, Ser: 1.03 mg/dL (ref 0.50–1.35)
GFR calc non Af Amer: 67 mL/min — ABNORMAL LOW (ref >90)
GFR, EST AFRICAN AMERICAN: 78 mL/min — AB (ref >90)
Glucose, Bld: 90 mg/dL (ref 70–99)
POTASSIUM: 3.6 meq/L — AB (ref 3.7–5.3)
Sodium: 144 mEq/L (ref 137–147)

## 2014-10-07 LAB — TROPONIN I: Troponin I: <0.3 ng/mL (ref <0.30)

## 2014-10-07 MED ORDER — IRBESARTAN 75 MG PO TABS
75.0000 mg | ORAL_TABLET | Freq: Every day | ORAL | Status: DC
Start: 2014-10-07 — End: 2014-10-08
  Administered 2014-10-07 – 2014-10-08 (×2): 75 mg via ORAL
  Filled 2014-10-07 (×2): qty 1

## 2014-10-07 MED ORDER — ASPIRIN EC 81 MG PO TBEC
81.0000 mg | DELAYED_RELEASE_TABLET | Freq: Every day | ORAL | Status: DC
  Administered 2014-10-07 – 2014-10-08 (×2): 81 mg via ORAL
  Filled 2014-10-07 (×2): qty 1

## 2014-10-07 MED ORDER — FUROSEMIDE 80 MG PO TABS
80.0000 mg | ORAL_TABLET | Freq: Two times a day (BID) | ORAL | Status: DC
  Administered 2014-10-07 – 2014-10-08 (×3): 80 mg via ORAL
  Filled 2014-10-07 (×6): qty 1

## 2014-10-07 MED ORDER — POTASSIUM CHLORIDE CRYS ER 20 MEQ PO TBCR
20.0000 meq | EXTENDED_RELEASE_TABLET | Freq: Two times a day (BID) | ORAL | Status: DC
  Administered 2014-10-07 – 2014-10-08 (×3): 20 meq via ORAL
  Filled 2014-10-07 (×4): qty 1

## 2014-10-07 MED ORDER — NEPRO/CARBSTEADY PO LIQD
237.0000 mL | ORAL | Status: DC
  Administered 2014-10-07: 237 mL via ORAL
  Filled 2014-10-07 (×3): qty 237

## 2014-10-07 NOTE — Progress Notes (Signed)
INITIAL NUTRITION ASSESSMENT  DOCUMENTATION CODES Per approved criteria  -Not Applicable   INTERVENTION: Nepro Shake po once daily, each supplement provides 425 kcal and 19 grams protein  NUTRITION DIAGNOSIS: Inadequate oral intake related to chronic illness as evidenced by reported intake less than estimated needs.   Goal: Pt to meet >/= 90% of their estimated nutrition needs   Monitor:  Weight trend, po intake, acceptance of supplements  Reason for Assessment: MST  80 y.o. male  Admitting Dx: <principal problem not specified>  ASSESSMENT: The patient is a 80 year old Caucasian male with past medical history of CAD with remote apical infarct and high risk PCI to left main in 7782, chronic systolic heart failure with baseline EF 45-50%, ischemic cardiomyopathy, COPD, hypertension, hyperlipidemia and remote history of apical LV thrombus.  - Pt not in room during RD visit.  - Per MD note, pt has not been compliant with daily weight, salt, and fluid restriction. Pt has been drinking a lot of chicken noodle soup which is high in sodium.  - Pt's weight has decreased by 16 lbs since 11/6. Unsure of pt's dry weight.  - PO intake is recorded as 10-75%. - Will send nutritional supplement daily.   Height: Ht Readings from Last 1 Encounters:  10/06/14 5\' 11"  (1.803 m)    Weight: Wt Readings from Last 1 Encounters:  10/07/14 174 lb (78.926 kg)    Ideal Body Weight: 75.3 kg  % Ideal Body Weight: 105%  Wt Readings from Last 10 Encounters:  10/07/14 174 lb (78.926 kg)  09/23/14 190 lb (86.183 kg)  09/09/14 190 lb (86.183 kg)  07/18/14 196 lb (88.905 kg)  03/16/14 184 lb (83.462 kg)  10/15/13 188 lb (85.276 kg)  06/09/13 183 lb (83.008 kg)  02/01/13 180 lb 3.2 oz (81.738 kg)  09/24/12 188 lb 6.4 oz (85.458 kg)  06/24/12 179 lb (81.194 kg)    Usual Body Weight: 180-190 lbs per chart history  % Usual Body Weight: 97%  BMI:  Body mass index is 24.28 kg/(m^2).  Estimated  Nutritional Needs: Kcal: 2000-2200 Protein: 105-130 g Fluid: 1200 ml fluid restriction  Skin: intact  Diet Order: Diet renal/carb modified with 1200 ml fluid restriction  EDUCATION NEEDS: -No education needs identified at this time   Intake/Output Summary (Last 24 hours) at 10/07/14 1429 Last data filed at 10/07/14 1400  Gross per 24 hour  Intake    966 ml  Output   2400 ml  Net  -1434 ml    Last BM: 11/19   Labs:   Recent Labs Lab 10/06/14 1000 10/06/14 1925 10/07/14 0510  NA 142  --  144  K 4.0  --  3.6*  CL 102  --  99  CO2 28  --  34*  BUN 13  --  11  CREATININE 0.87 0.80 1.03  CALCIUM 8.8  --  8.8  GLUCOSE 109*  --  90    CBG (last 3)  No results for input(s): GLUCAP in the last 72 hours.  Scheduled Meds: . allopurinol  100 mg Oral Daily  . antiseptic oral rinse  7 mL Mouth Rinse q12n4p  . aspirin EC  81 mg Oral Daily  . calcium carbonate  1 tablet Oral Q breakfast  . cephALEXin  500 mg Oral 3 times per day  . chlorhexidine  15 mL Mouth Rinse BID  . cholecalciferol  1,000 Units Oral Daily  . clopidogrel  75 mg Oral Daily  . furosemide  80 mg  Oral BID  . heparin  5,000 Units Subcutaneous 3 times per day  . irbesartan  75 mg Oral Daily  . isosorbide mononitrate  60 mg Oral BID  . metoprolol  50 mg Oral BID  . mometasone-formoterol  2 puff Inhalation BID  . omega-3 acid ethyl esters  1 g Oral Daily  . potassium chloride  20 mEq Oral BID  . ranolazine  500 mg Oral BID  . rosuvastatin  10 mg Oral Daily  . sodium bicarbonate  650 mg Oral Daily  . sodium chloride  3 mL Intravenous Q12H  . theophylline  300 mg Oral Daily  . tiotropium  18 mcg Inhalation Daily    Continuous Infusions:   Past Medical History  Diagnosis Date  . 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 Has chronic angina; S/P DES to the LCX and DES to the L MAIN 12/2011. Now on  Plavix and ASA.   Marland Kitchen Hyperlipidemia   . Hypertension   . Long-term (current) use of anticoagulants     For hx of LV apical aneurysm  . LVH (left ventricular hypertrophy)   . Atrial fibrillation     Unclear history  . Tobacco abuse   . Systolic heart failure     EF is 35% per cath 12/2011  . Myocardial infarction 1981    [name] 01/14/2012  . Left ventricular apical thrombus     hx/notes 10/06/2014  . On home oxygen therapy     "2.5L 24/7" (10/06/2014)  . COPD (chronic obstructive pulmonary disease)   . Pneumonia "several times"  . Arthritis     "shoulders; back" (10/06/2014)  . History of gout   . Chronic back pain     "anytime it's wet and cold" (10/06/2014)    Past Surgical History  Procedure Laterality Date  . Cholecystectomy    . Hernia repair    . Coronary angioplasty with stent placement  12/2011    LCX and L main - drug eluting  . Cardiac catheterization  1981    [name] 01/14/2012    Laurette Schimke MS, RD, LDN

## 2014-10-07 NOTE — Progress Notes (Signed)
*  PRELIMINARY RESULTS* Vascular Ultrasound Lower extremity venous duplex has been completed.  Preliminary findings: No evidence of DVT.   Landry Mellow, RDMS, RVT  10/07/2014, 2:17 PM

## 2014-10-07 NOTE — Progress Notes (Signed)
CARDIAC REHAB PHASE I   PRE:  Rate/Rhythm: 82 SR with PVC's  BP:  Supine:   Sitting: 113/64  Standing:    SaO2: 95 4L  MODE:  Ambulation: 100 ft   POST:  Rate/Rhythm: 76 SR with PVC's  BP:  Supine:   Sitting: 128/65  Standing:    SaO2: 96 3L 1450-1530 Assisted X 1 used walker and O2 3L to ambulate. Gait steady with walker. Pt c/o of weakness and he his very DOE. Pt able to walk only 100 feet. He was exhausted by end and walk and SOB. RA sat in hall 95% on 3L and end of walk  To recliner after walk with call light in reach. Started CHF education with pt. He had CHF packet. We discussed CHF zones, daily weights and  fluid and sodium restrictions. Pt is fragile and seems very deconditioned. He lives with his son. Pt sometimes forgets his medications and he eat a lot of canned foods.He has only a cane and O2 at home. He will probably need a walker for home use. Would recommend [facility] RN and PT. We will continue to follow pt.  Rodney Langton RN 10/07/2014 3:43 PM

## 2014-10-07 NOTE — Progress Notes (Addendum)
Patient Name: Sean Figueroa Date of Encounter: 10/07/2014     Active Problems:   Acute on chronic systolic heart failure    SUBJECTIVE  The patient is feeling well today.  He has had no further left arm pain.  He has a loose cough but is not producing much sputum.  His right leg redness has improved and is less painful.  He has had a good initial diuresis.  CURRENT MEDS . allopurinol  100 mg Oral Daily  . antiseptic oral rinse  7 mL Mouth Rinse q12n4p  . aspirin EC  81 mg Oral Daily  . calcium carbonate  1 tablet Oral Q breakfast  . cephALEXin  500 mg Oral 3 times per day  . chlorhexidine  15 mL Mouth Rinse BID  . cholecalciferol  1,000 Units Oral Daily  . clopidogrel  75 mg Oral Daily  . furosemide  80 mg Intravenous BID  . heparin  5,000 Units Subcutaneous 3 times per day  . isosorbide mononitrate  60 mg Oral BID  . metoprolol  50 mg Oral BID  . mometasone-formoterol  2 puff Inhalation BID  . omega-3 acid ethyl esters  1 g Oral Daily  . potassium chloride SA  20 mEq Oral Daily  . ranolazine  500 mg Oral BID  . rosuvastatin  10 mg Oral Daily  . sodium bicarbonate  650 mg Oral Daily  . sodium chloride  3 mL Intravenous Q12H  . theophylline  300 mg Oral Daily  . tiotropium  18 mcg Inhalation Daily    OBJECTIVE  Filed Vitals:   10/06/14 1747 10/06/14 2153 10/07/14 0114 10/07/14 0533  BP: 170/91 136/71 121/54 126/71  Pulse: 66 71 73 64  Temp: 97.5 F (36.4 C)  97.3 F (36.3 C) 98.2 F (36.8 C)  TempSrc: Oral  Oral Oral  Resp: 20 28 22 20   Height: 5\' 11"  (1.803 m)     Weight: 178 lb 9.2 oz (81 kg)   174 lb (78.926 kg)  SpO2: 98% 98% 100% 97%    Intake/Output Summary (Last 24 hours) at 10/07/14 0755 Last data filed at 10/07/14 3810  Gross per 24 hour  Intake    363 ml  Output   2200 ml  Net  -1837 ml   Filed Weights   10/06/14 1747 10/07/14 0533  Weight: 178 lb 9.2 oz (81 kg) 174 lb (78.926 kg)    PHYSICAL EXAM  General: Pleasant, NAD. Neuro: Alert  and oriented X 3. Moves all extremities spontaneously. Psych: Normal affect. HEENT:  Normal.  Right eye redness has improved.  Neck: Supple without bruits or JVD. Lungs:  Resp regular and unlabored, moderate coarse rhonchi persist Heart: RRR no s3, s4, or murmurs. Abdomen: Soft, non-tender, non-distended, BS + x 4.  Extremities: Minimal pretibial edema right greater than left.  Minimal residual erythema right leg.  Accessory Clinical Findings  CBC  Recent Labs  10/06/14 1000 10/06/14 1925  WBC 7.0 8.7  HGB 14.1 15.2  HCT 43.3 45.7  MCV 100.2* 100.0  PLT 168 175   Basic Metabolic Panel  Recent Labs  10/06/14 1000 10/06/14 1925 10/07/14 0510  NA 142  --  144  K 4.0  --  3.6*  CL 102  --  99  CO2 28  --  34*  GLUCOSE 109*  --  90  BUN 13  --  11  CREATININE 0.87 0.80 1.03  CALCIUM 8.8  --  8.8   Liver Function Tests  No results for input(s): AST, ALT, ALKPHOS, BILITOT, PROT, ALBUMIN in the last 72 hours. No results for input(s): LIPASE, AMYLASE in the last 72 hours. Cardiac Enzymes  Recent Labs  10/06/14 1925 10/06/14 2348 10/07/14 0510  TROPONINI <0.30 <0.30 <0.30   BNP Invalid input(s): POCBNP D-Dimer No results for input(s): DDIMER in the last 72 hours. Hemoglobin A1C No results for input(s): HGBA1C in the last 72 hours. Fasting Lipid Panel No results for input(s): CHOL, HDL, LDLCALC, TRIG, CHOLHDL, LDLDIRECT in the last 72 hours. Thyroid Function Tests No results for input(s): TSH, T4TOTAL, T3FREE, THYROIDAB in the last 72 hours.  Invalid input(s): FREET3  TELE  Normal sinus rhythm  ECG    Radiology/Studies  Dg Chest 2 View  09/20/2014   CLINICAL DATA:  Dyspnea.  Smoker.  EXAM: CHEST  2 VIEW  COMPARISON:  03/29/2013 and 03/15/2013 as well as chest CT 04/05/2013  FINDINGS: Examination demonstrates chronic opacification over the left base compatible with small effusion and associated atelectasis as there may be slight worsening of the effusion.  Mild stable cardiomegaly. Remainder of the exam is unchanged.  IMPRESSION: Chronic left basilar process likely small effusion with associated atelectasis/scarring. Possible slight worsening of the effusion.   Electronically Signed   By: Marin Olp M.D.   On: 09/20/2014 11:51   Dg Chest Port 1 View  10/06/2014   CLINICAL DATA:  Arm pain and shortness of breath.  EXAM: PORTABLE CHEST - 1 VIEW  COMPARISON:  09/20/2014  FINDINGS: The cardiac silhouette is partially obscured without gross interval change. There is new increased pulmonary vascular congestion with increased interstitial densities throughout both lungs. Consolidation in the left lung base has increased, again without evidence of a small left pleural effusion. No pneumothorax is identified.  IMPRESSION: 1. Increased pulmonary vascular congestion and bilateral interstitial densities, which may reflect mild edema. 2. Increased left basilar atelectasis versus consolidation with persistent small left pleural effusion.   Electronically Signed   By: Logan Bores   On: 10/06/2014 10:36    ASSESSMENT AND PLAN 1. Acute on chronic systolic HF Improved with IV Lasix.  Blood pressure is satisfactory.  We will restart valsartan today.  We will not restart HCTZ.  2. R lower extremity redness Cellulitis has improved on cephalexin  3. CAD - s/p remote apical infarct - s/p high risk DES to L main and LCx in Mar 2013 Troponins are normal 3.  No further left arm pain. - repeat echo.  Continue lifelong dual antiplatelet therapy  4. ischemic cardiomyopathy - Echo 06/17/2013 EF 18-29%, grade 1 diastolic dysfunction, LV thrombus, mild MR. repeat echo pending today.  5. COPD - on home O2, 2-2.5L - LFT on 09/23/2014  6. Hypertension 7. hyperlipidemia  8. remote history of apical LV thrombus 9. Red eye, no drainage, afebrile, ?subconjunctival  hemorrhage?  Redness of eye has improved overnight.  Plan: Switch to oral Lasix today.  He states that he has been drinking a lot of chicken noodle soup which is high in salt.  He will avoid this in the future.  He will try to do better with his diet. Ambulate in hall today. Resume ARB today. Await echo. Anticipate home tomorrow if stable. Replete potassium. Recheck BMET in am. Signed, Darlin Coco MD

## 2014-10-07 NOTE — Progress Notes (Signed)
Utilization review completed.  

## 2014-10-07 NOTE — Progress Notes (Signed)
  Echocardiogram 2D Echocardiogram has been performed.  Sean Figueroa 10/07/2014, 10:47 AM

## 2014-10-07 NOTE — Plan of Care (Signed)
Problem: Phase I Progression Outcomes Goal: Pain controlled with appropriate interventions Outcome: Completed/Met Date Met:  10/07/14 Goal: Voiding-avoid urinary catheter unless indicated Outcome: Completed/Met Date Met:  10/07/14

## 2014-10-08 ENCOUNTER — Other Ambulatory Visit: Payer: Self-pay | Admitting: Physician Assistant

## 2014-10-08 DIAGNOSIS — I5023 Acute on chronic systolic (congestive) heart failure: Secondary | ICD-10-CM

## 2014-10-08 LAB — BASIC METABOLIC PANEL
ANION GAP: 15 (ref 5–15)
BUN: 17 mg/dL (ref 6–23)
CALCIUM: 9.6 mg/dL (ref 8.4–10.5)
CO2: 33 meq/L — AB (ref 19–32)
Chloride: 96 mEq/L (ref 96–112)
Creatinine, Ser: 1.17 mg/dL (ref 0.50–1.35)
GFR calc Af Amer: 67 mL/min — ABNORMAL LOW (ref >90)
GFR calc non Af Amer: 57 mL/min — ABNORMAL LOW (ref >90)
GLUCOSE: 90 mg/dL (ref 70–99)
POTASSIUM: 3.7 meq/L (ref 3.7–5.3)
SODIUM: 144 meq/L (ref 137–147)

## 2014-10-08 MED ORDER — IRBESARTAN 75 MG PO TABS: 75.0000 mg | ORAL_TABLET | Freq: Every day | ORAL | Status: DC

## 2014-10-08 MED ORDER — CEPHALEXIN 500 MG PO CAPS: 500.0000 mg | ORAL_CAPSULE | Freq: Three times a day (TID) | ORAL | Status: DC

## 2014-10-08 MED ORDER — FUROSEMIDE 40 MG PO TABS: 80.0000 mg | ORAL_TABLET | Freq: Two times a day (BID) | ORAL | Status: DC

## 2014-10-08 MED ORDER — POTASSIUM CHLORIDE CRYS ER 20 MEQ PO TBCR: 20.0000 meq | EXTENDED_RELEASE_TABLET | Freq: Two times a day (BID) | ORAL | Status: DC

## 2014-10-08 NOTE — Care Management Note (Signed)
    Page 1 of 2   10/08/2014     12:31:43 PM CARE MANAGEMENT NOTE 10/08/2014  Patient:  Sean Figueroa, Sean Figueroa   Account Number:  [id number]  Date Initiated:  10/08/2014  Documentation initiated by:  [facility]  Subjective/Objective Assessment:   adm: presented with L arm pain     Action/Plan:   discharge planning   Anticipated DC Date:  10/08/2014   Anticipated DC Plan:  [place]  CM consult      Choice offered to / List presented to:     DME arranged  [facility]      DME agency  [place] arranged  HH-1 RN  [place].   Status of service:  Completed, signed off Medicare Important Message given?   (If response is "NO", the following Medicare IM given date fields will be blank) Date Medicare IM given:   Medicare IM given by:   Date Additional Medicare IM given:   Additional Medicare IM given by:    Discharge Disposition:  HOME/SELF CARE  Per UR Regulation:    If discussed at Long Length of Stay Meetings, dates discussed:    Comments:  10/08/14 CM met with pt in room to confirm home oxygen vendor.  Pt states it is [facility].  CM called [facility] rep, James to please deliver a tank of O2 to the room so the pt can get home.  Pt is being transported home by neighbor whoi does not have access into house to get pt's tank.  Pt to have HHPT/RN.  CM has requested orders.  Referral called to [facility] rep, Miranda.   No other CM needs were communicated.  Mariane Masters, BSN, CM [phone].

## 2014-10-08 NOTE — Plan of Care (Signed)
Problem: Phase I Progression Outcomes Goal: Hemodynamically stable Outcome: Completed/Met Date Met:  10/08/14

## 2014-10-08 NOTE — Progress Notes (Signed)
The patient was having intermittent short runs of ventricular bigeminy.  He has been asymptomatic.  The MD on-call was text paged via [place].

## 2014-10-08 NOTE — Discharge Summary (Signed)
CARDIOLOGY DISCHARGE SUMMARY   Patient ID: Sean Figueroa MRN: [id number] DOB/AGE: [id number] 78 y.o.  Admit date: 10/06/2014 Discharge date: 10/08/2014  PCP: [name], MD Primary Cardiologist: Dr. Mare Ferrari  Primary Discharge Diagnosis:  Acute on chronic systolic heart failure  Secondary Discharge Diagnosis:  Cellulitis, RLE  Consults: Case Management  Procedures: 2-D echocardiogram  Hospital Course: Sean Figueroa is a 80 y.o. male with past medical history of CAD with remote apical infarct and the high risk PCI to left main in 8676, chronic systolic heart failure with baseline EF 45-50%, ischemic cardiomyopathy, COPD, hypertension, hyperlipidemia and remote history of apical LV thrombus on Coumadin presented 11/19 with L arm pain. He was found to be in heart failure and there was concern for right lower extremity cellulitis.  He was diuresed with IV Lasix and lost 5 pounds during his hospital stay. As his volume status improved, his respiratory status improved. His renal function and electrolytes were followed closely and his potassium was supplemented as needed.  His right leg improved on antibiotics and he will complete those as an outpatient.  He had fallen recently at home. Because of the falls and the heart failure, a HHRN, PT/OT were ordered for him. He is on home O2 as well.  By 11/21, his respiratory status, weight and activity level were approaching baseline. He was seen by Dr. Marlou Porch and all data were reviewed. No further inpatient workup was indicated and he is considered stable for discharge, to follow up as an outpatient.  Labs:   Lab Results  Component Value Date   WBC 8.7 10/06/2014   HGB 15.2 10/06/2014   HCT 45.7 10/06/2014   MCV 100.0 10/06/2014   PLT 165 10/06/2014     Recent Labs Lab 10/08/14 0510  NA 144  K 3.7  CL 96  CO2 33*  BUN 17  CREATININE 1.17  CALCIUM 9.6  GLUCOSE 90    Recent Labs  10/06/14 1925 10/06/14 2348  10/07/14 0510  TROPONINI <0.30 <0.30 <0.30   PRO B NATRIURETIC PEPTIDE (BNP)  Date/Time Value Ref Range Status  10/06/2014 10:00 AM 1522.0* 0 - 450 pg/mL Final  01/14/2012 11:04 AM 1942.0* 0 - 450 pg/mL Final      Radiology:  Dg Chest Port 1 View 10/06/2014   CLINICAL DATA:  Arm pain and shortness of breath.  EXAM: PORTABLE CHEST - 1 VIEW  COMPARISON:  09/20/2014  FINDINGS: The cardiac silhouette is partially obscured without gross interval change. There is new increased pulmonary vascular congestion with increased interstitial densities throughout both lungs. Consolidation in the left lung base has increased, again without evidence of a small left pleural effusion. No pneumothorax is identified.  IMPRESSION: 1. Increased pulmonary vascular congestion and bilateral interstitial densities, which may reflect mild edema. 2. Increased left basilar atelectasis versus consolidation with persistent small left pleural effusion.   Electronically Signed   By: Logan Bores   On: 10/06/2014 10:36    EKG: 10/06/2014 SR, L axis deviation, frequent PVCs  FOLLOW UP PLANS AND APPOINTMENTS Allergies  Allergen Reactions  . Indomethacin     unknown  . Lipitor [Atorvastatin Calcium]     Leg pain  . Pravachol     Leg pain  . Procardia [Nifedipine]   . Zaroxolyn [Metolazone]      Medication List    STOP taking these medications        valsartan-hydrochlorothiazide 160-25 MG per tablet  Commonly known as:  DIOVAN-HCT  TAKE these medications        allopurinol 100 MG tablet  Commonly known as:  ZYLOPRIM  Take 100 mg by mouth daily.     ALPRAZolam 0.25 MG tablet  Commonly known as:  XANAX  TAKE 1 TABLET TWICE DAILY AS NEEDED     aspirin 81 MG tablet  Take 81 mg by mouth daily.     calcium carbonate 200 MG capsule  Take 500 mg by mouth daily.     cephALEXin 500 MG capsule  Commonly known as:  KEFLEX  Take 1 capsule (500 mg total) by mouth every 8 (eight) hours.     clopidogrel 75  MG tablet  Commonly known as:  PLAVIX  TAKE 1 TABLET EVERY DAY WITH BREAKFAST     CRESTOR 10 MG tablet  Generic drug:  rosuvastatin  TAKE 1 TABLET (10 MG TOTAL) BY MOUTH DAILY.     [employer] D3 1000 UNITS capsule  Generic drug:  Cholecalciferol  Take 1,000 Units by mouth daily.     fish oil-omega-3 fatty acids 1000 MG capsule  Take 2 g by mouth daily.     Fluticasone-Salmeterol 100-50 MCG/DOSE Aepb  Commonly known as:  ADVAIR DISKUS  Inhale 1 puff into the lungs 2 (two) times daily.     furosemide 40 MG tablet  Commonly known as:  LASIX  Take 2 tablets (80 mg total) by mouth 2 (two) times daily.     guaiFENesin 600 MG 12 hr tablet  Commonly known as:  MUCINEX  Take 1,200 mg by mouth 2 (two) times daily as needed.     irbesartan 75 MG tablet  Commonly known as:  AVAPRO  Take 1 tablet (75 mg total) by mouth daily.     isosorbide mononitrate 60 MG 24 hr tablet  Commonly known as:  IMDUR  TAKE 1 TABLET (60 MG TOTAL) BY MOUTH 2 (TWO) TIMES DAILY.     meclizine 25 MG tablet  Commonly known as:  ANTIVERT  TAKE 1 TABLET (25 MG TOTAL) BY MOUTH 3 (THREE) TIMES DAILY AS NEEDED FOR DIZZINESS. FOR DIZZINESS     metoprolol 50 MG tablet  Commonly known as:  LOPRESSOR  TAKE 1 TABLET (50 MG TOTAL) BY MOUTH 2 (TWO) TIMES DAILY.     nitroGLYCERIN 0.4 MG SL tablet  Commonly known as:  NITROSTAT  Place 1 tablet (0.4 mg total) under the tongue every 5 (five) minutes as needed.     potassium chloride SA 20 MEQ tablet  Commonly known as:  K-DUR,KLOR-CON  Take 1 tablet (20 mEq total) by mouth 2 (two) times daily.     PROAIR HFA 108 (90 BASE) MCG/ACT inhaler  Generic drug:  albuterol  INHALE 2 PUFFS INTO LUNGS EVERY 6 HOURS AS NEEDED FOR WHEEZING     RANEXA 500 MG 12 hr tablet  Generic drug:  ranolazine  TAKE 1 TABLET TWICE DAILY     sodium bicarbonate 650 MG tablet  Take 650 mg by mouth daily.     temazepam 30 MG capsule  Commonly known as:  RESTORIL  TAKE ONE CAPSULE BY MOUTH AT  BEDTIME AS NEEDED     THEO-24 300 MG 24 hr capsule  Generic drug:  theophylline  TAKE 1 CAPSULE (300 MG TOTAL) BY MOUTH DAILY.     tiotropium 18 MCG inhalation capsule  Commonly known as:  SPIRIVA HANDIHALER  PLACE 1 CAPSULE (18 MCG TOTAL) INTO INHALER AND INHALE DAILY.     traMADol 50 MG tablet  Commonly known as:  ULTRAM  Take 50 mg by mouth every 6 (six) hours as needed for moderate pain.        Discharge Instructions    (HEART FAILURE PATIENTS) Call MD:  Anytime you have any of the following symptoms: 1) 3 pound weight gain in 24 hours or 5 pounds in 1 week 2) shortness of breath, with or without a dry hacking cough 3) swelling in the hands, feet or stomach 4) if you have to sleep on extra pillows at night in order to breathe.    Complete by:  As directed      Diet - low sodium heart healthy    Complete by:  As directed      Increase activity slowly    Complete by:  As directed           Follow-up Information    Follow up with [place].   Why:  home health physical therapy and nurse   Contact information:   [address] [place] [address] [phone]       Follow up with Darlin Coco, MD.   Specialty:  Cardiology   Why:  The office will call   Contact information:   [address] [place] [address] [phone]       BRING ALL MEDICATIONS WITH YOU TO FOLLOW UP APPOINTMENTS  Time spent with patient to include physician time: 36 min Signed: Rosaria Ferries, PA-C 10/08/2014, 1:30 PM Co-Sign MD

## 2014-10-08 NOTE — Progress Notes (Signed)
Subjective:  No complaints, cough improved. Leg improved.   Objective:  Vital Signs in the last 24 hours: Temp:  [97.5 F (36.4 C)-98.7 F (37.1 C)] 98.7 F (37.1 C) (11/21 0943) Pulse Rate:  [62-73] 73 (11/21 0943) Resp:  [18-20] 18 (11/21 0548) BP: (105-142)/(55-98) 142/77 mmHg (11/21 0943) SpO2:  [94 %-98 %] 97 % (11/21 0943) Weight:  [173 lb (78.472 kg)] 173 lb (78.472 kg) (11/21 0548)  Intake/Output from previous day: 11/20 0701 - 11/21 0700 In: 843 [P.O.:840; I.V.:3] Out: 975 [Urine:975]   Physical Exam: General: Pleasant, NAD. Neuro: Alert and oriented X 3. Moves all extremities spontaneously. Psych: Normal affect. HEENT: Normal. Right eye redness has improved. Neck: Supple without bruits or JVD. Lungs: Resp regular and unlabored, moderate coarse rhonchi persist Heart: RRR no s3, s4, or murmurs. Abdomen: Soft, non-tender, non-distended, BS + x 4.  Extremities: Minimal pretibial edema right greater than left. Minimal residual erythema right leg.    Lab Results:  Recent Labs  10/06/14 1000 10/06/14 1925  WBC 7.0 8.7  HGB 14.1 15.2  PLT 168 165    Recent Labs  10/07/14 0510 10/08/14 0510  NA 144 144  K 3.6* 3.7  CL 99 96  CO2 34* 33*  GLUCOSE 90 90  BUN 11 17  CREATININE 1.03 1.17    Recent Labs  10/06/14 2348 10/07/14 0510  TROPONINI <0.30 <0.30   Telemetry: NSR Personally viewed.   Cardiac Studies:  ECHO EF 45%, no mural thrombus  Scheduled Meds: . allopurinol  100 mg Oral Daily  . antiseptic oral rinse  7 mL Mouth Rinse q12n4p  . aspirin EC  81 mg Oral Daily  . calcium carbonate  1 tablet Oral Q breakfast  . cephALEXin  500 mg Oral 3 times per day  . chlorhexidine  15 mL Mouth Rinse BID  . cholecalciferol  1,000 Units Oral Daily  . clopidogrel  75 mg Oral Daily  . feeding supplement (NEPRO CARB STEADY)  237 mL Oral Q24H  . furosemide  80 mg Oral BID  . heparin  5,000 Units Subcutaneous 3 times per day  .  irbesartan  75 mg Oral Daily  . isosorbide mononitrate  60 mg Oral BID  . metoprolol  50 mg Oral BID  . mometasone-formoterol  2 puff Inhalation BID  . omega-3 acid ethyl esters  1 g Oral Daily  . potassium chloride  20 mEq Oral BID  . ranolazine  500 mg Oral BID  . rosuvastatin  10 mg Oral Daily  . sodium bicarbonate  650 mg Oral Daily  . sodium chloride  3 mL Intravenous Q12H  . theophylline  300 mg Oral Daily  . tiotropium  18 mcg Inhalation Daily   Continuous Infusions:  PRN Meds:.sodium chloride, acetaminophen, albuterol, ALPRAZolam, guaiFENesin, meclizine, nitroGLYCERIN, ondansetron (ZOFRAN) IV, sodium chloride, temazepam, traMADol  Assessment/Plan:  Active Problems:   Acute on chronic systolic heart failure  OK for DC Diet modification BMET stable.   1. Acute on chronic systolic HF Improved with IV Lasix. Back on PO. Blood pressure is satisfactory.  Valsartan. We will not restart HCTZ.  2. R lower extremity redness Cellulitis has improved on cephalexin. Continue for 7 day course.  3. CAD - s/p remote apical infarct - s/p high risk DES to L main and LCx in Mar 2013 Troponins are normal 3. No further left arm pain.   4. ischemic cardiomyopathy - Echo 06/17/2013 EF 44-92%, grade 1 diastolic dysfunction, LV thrombus, mild  MR. repeat echo shows no mural thrombus, same EF.  5. COPD - on home O2, 2-2.5L - LFT on 09/23/2014  - chronic cough  6. Hypertension 7. hyperlipidemia  8. remote history of apical LV thrombus 9. Red eye, no drainage, afebrile,  Redness of eye has improved.   Will get home health PT/RN for him.  One week transitional care visit with Dr. Mare Ferrari or APP.  BMET at that time.    [place], [place] 10/08/2014, 11:44 AM

## 2014-10-08 NOTE — Plan of Care (Signed)
Problem: Phase I Progression Outcomes Goal: EF % per last Echo/documented,Core Reminder form on chart Outcome: Completed/Met Date Met:  10/08/14 EF 40-45%

## 2014-10-08 NOTE — Plan of Care (Signed)
Problem: Phase II Progression Outcomes Goal: Tolerating diet Outcome: Completed/Met Date Met:  10/08/14

## 2014-10-08 NOTE — Plan of Care (Signed)
Problem: Phase II Progression Outcomes Goal: Pain controlled Outcome: Completed/Met Date Met:  10/08/14

## 2014-10-08 NOTE — Progress Notes (Addendum)
CARDIAC REHAB PHASE I   PRE:  Rate/Rhythm: 87 sinus rhythm  BP:  Supine:   Sitting: 137/70  Standing:    SaO2: 99% 4L  MODE:  Ambulation: 200 ft   POST:  Rate/Rhythem: 81 sinus rhythm  BP:  Supine:   Sitting: 143/71  Standing:    SaO2: 95% 4 L  [phone] Pt ambulated in hallway using rolling walker x1 assist.  Pt took 3 standing rest breaks due to dyspnea.  Education reinforced including importance of using rolling walker at home for safety,  low sodium diet, how to read labels, daily weights and when to call MD.  Understanding verbalized   Carolyne Littles

## 2014-10-08 NOTE — Plan of Care (Signed)
Problem: Phase I Progression Outcomes Goal: Other Phase I Outcomes/Goals Outcome: Completed/Met Date Met:  10/08/14

## 2014-10-08 NOTE — Plan of Care (Signed)
Problem: Phase I Progression Outcomes Goal: Up in chair, BRP Outcome: Completed/Met Date Met:  10/08/14     

## 2014-10-11 ENCOUNTER — Telehealth: Payer: Self-pay | Admitting: Cardiology

## 2014-10-11 DIAGNOSIS — I5023 Acute on chronic systolic (congestive) heart failure: Secondary | ICD-10-CM

## 2014-10-11 NOTE — Telephone Encounter (Signed)
New message   appt on  12/4 @ 10:00  Sean Figueroa is a patient of Dr. Mare Ferrari. He needs a TOC appointment and a BMET next week.   thank you  Suanne Marker

## 2014-10-12 NOTE — Telephone Encounter (Signed)
Patient contacted regarding discharge from [facility] on 10/08/2014  Patient understands to follow up with provider Ellen Henri on 12/22/13 at 10:00 at [facility]. Patient understands discharge instructions? yes Patient understands medications and regiment? yes Patient understands to bring all medications to this visit? Yes  Pt states he feels good.  Wt this AM was 165 lbs.  No SOB, no edema in hands or ankles and no CP. Using O2 at night.  States he is sleeping much better. Discussed his diet and what to avoid during holidays.  Verbalizes understanding and realizes he needs to make adjustments to his eating and life style habits. 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

## 2014-10-13 LAB — CULTURE, BLOOD (ROUTINE X 2)
Culture: NO GROWTH
Culture: NO GROWTH

## 2014-10-21 ENCOUNTER — Ambulatory Visit (INDEPENDENT_AMBULATORY_CARE_PROVIDER_SITE_OTHER): Payer: Medicare Other | Admitting: Cardiology

## 2014-10-21 ENCOUNTER — Other Ambulatory Visit (INDEPENDENT_AMBULATORY_CARE_PROVIDER_SITE_OTHER): Payer: Medicare Other | Admitting: *Deleted

## 2014-10-21 ENCOUNTER — Encounter: Payer: Self-pay | Admitting: Cardiology

## 2014-10-21 ENCOUNTER — Telehealth: Payer: Self-pay | Admitting: Cardiology

## 2014-10-21 VITALS — BP 98/58 | HR 52 | Ht 71.0 in | Wt 177.8 lb

## 2014-10-21 DIAGNOSIS — I252 Old myocardial infarction: Secondary | ICD-10-CM

## 2014-10-21 DIAGNOSIS — I480 Paroxysmal atrial fibrillation: Secondary | ICD-10-CM

## 2014-10-21 DIAGNOSIS — I5023 Acute on chronic systolic (congestive) heart failure: Secondary | ICD-10-CM

## 2014-10-21 NOTE — Telephone Encounter (Signed)
New Message  Daria from [facility] called asking about the oxygen prescription. Her concerns: Need demo for Ins, Ht, Wt, qualifying dx, saturation level to be only in office notes, says saturation level was 84% (while at rest?). If pt needs 3 L of O2 (continuous or at night?) portable gas. Please call back and discuss.

## 2014-10-21 NOTE — Patient Instructions (Signed)
Your physician recommends that you continue on your current medications as directed. Please refer to the Current Medication list given to you today.   Your physician recommends that you return for lab work in:  [place] physician recommends that you schedule a follow-up appointment  WITH DR BRACKBILL IN 3 MONTHS    CONTINUE TO WEIGHT DAILY  CONTACT OFFICE IF WEIGHT IS MORE THAN 3LBS IN 24 HOURS   Low-Sodium Eating Plan Sodium raises blood pressure and causes water to be held in the body. Getting less sodium from food will help lower your blood pressure, reduce any swelling, and protect your heart, liver, and kidneys. We get sodium by adding salt (sodium chloride) to food. Most of our sodium comes from canned, boxed, and frozen foods. Restaurant foods, fast foods, and pizza are also very high in sodium. Even if you take medicine to lower your blood pressure or to reduce fluid in your body, getting less sodium from your food is important. WHAT IS MY PLAN? Most people should limit their sodium intake to 2,300 mg a day. Your health care provider recommends that you limit your sodium intake to __________ a day.  WHAT DO I NEED TO KNOW ABOUT THIS EATING PLAN? For the low-sodium eating plan, you will follow these general guidelines:  Choose foods with a % Daily Value for sodium of less than 5% (as listed on the food label).   Use salt-free seasonings or herbs instead of table salt or sea salt.   Check with your health care provider or pharmacist before using salt substitutes.   Eat fresh foods.  Eat more vegetables and fruits.  Limit canned vegetables. If you do use them, rinse them well to decrease the sodium.   Limit cheese to 1 oz (28 g) per day.   Eat lower-sodium products, often labeled as "lower sodium" or "no salt added."  Avoid foods that contain monosodium glutamate (MSG). MSG is sometimes added to [place] food and some canned foods.  Check food labels  (Nutrition Facts labels) on foods to learn how much sodium is in one serving.  Eat more home-cooked food and less restaurant, buffet, and fast food.  When eating at a restaurant, ask that your food be prepared with less salt or none, if possible.  HOW DO I READ FOOD LABELS FOR SODIUM INFORMATION? The Nutrition Facts label lists the amount of sodium in one serving of the food. If you eat more than one serving, you must multiply the listed amount of sodium by the number of servings. Food labels may also identify foods as:  Sodium free--Less than 5 mg in a serving.  Very low sodium--35 mg or less in a serving.  Low sodium--140 mg or less in a serving.  Light in sodium--50% less sodium in a serving. For example, if a food that usually has 300 mg of sodium is changed to become light in sodium, it will have 150 mg of sodium.  Reduced sodium--25% less sodium in a serving. For example, if a food that usually has 400 mg of sodium is changed to reduced sodium, it will have 300 mg of sodium. WHAT FOODS CAN I EAT? Grains Low-sodium cereals, including oats, puffed wheat and rice, and shredded wheat cereals. Low-sodium crackers. Unsalted rice and pasta. Lower-sodium bread.  Vegetables Frozen or fresh vegetables. Low-sodium or reduced-sodium canned vegetables. Low-sodium or reduced-sodium tomato sauce and paste. Low-sodium or reduced-sodium tomato and vegetable juices.  Fruits Fresh, frozen, and canned fruit.  Fruit juice.  Meat and Other Protein Products Low-sodium canned tuna and salmon. Fresh or frozen meat, poultry, seafood, and fish. Lamb. Unsalted nuts. Dried beans, peas, and lentils without added salt. Unsalted canned beans. Homemade soups without salt. Eggs.  Dairy Milk. Soy milk. Ricotta cheese. Low-sodium or reduced-sodium cheeses. Yogurt.  Condiments Fresh and dried herbs and spices. Salt-free seasonings. Onion and garlic powders. Low-sodium varieties of mustard and ketchup. Lemon  juice.  Fats and Oils Reduced-sodium salad dressings. Unsalted butter.  Other Unsalted popcorn and pretzels.  The items listed above may not be a complete list of recommended foods or beverages. Contact your dietitian for more options. WHAT FOODS ARE NOT RECOMMENDED? Grains Instant hot cereals. Bread stuffing, pancake, and biscuit mixes. Croutons. Seasoned rice or pasta mixes. Noodle soup cups. Boxed or frozen macaroni and cheese. Self-rising flour. Regular salted crackers. Vegetables Regular canned vegetables. Regular canned tomato sauce and paste. Regular tomato and vegetable juices. Frozen vegetables in sauces. Salted french fries. Olives. Angie Fava. Relishes. Sauerkraut. Salsa. Meat and Other Protein Products Salted, canned, smoked, spiced, or pickled meats, seafood, or fish. Bacon, ham, sausage, hot dogs, corned beef, chipped beef, and packaged luncheon meats. Salt pork. Jerky. Pickled herring. Anchovies, regular canned tuna, and sardines. Salted nuts. Dairy Processed cheese and cheese spreads. Cheese curds. Blue cheese and cottage cheese. Buttermilk.  Condiments Onion and garlic salt, seasoned salt, table salt, and sea salt. Canned and packaged gravies. Worcestershire sauce. Tartar sauce. Barbecue sauce. Teriyaki sauce. Soy sauce, including reduced sodium. Steak sauce. Fish sauce. Oyster sauce. Cocktail sauce. Horseradish. Regular ketchup and mustard. Meat flavorings and tenderizers. Bouillon cubes. Hot sauce. Tabasco sauce. Marinades. Taco seasonings. Relishes. Fats and Oils Regular salad dressings. Salted butter. Margarine. Ghee. Bacon fat.  Other Potato and tortilla chips. Corn chips and puffs. Salted popcorn and pretzels. Canned or dried soups. Pizza. Frozen entrees and pot pies.  The items listed above may not be a complete list of foods and beverages to avoid. Contact your dietitian for more information. Document Released: 04/26/2002 Document Revised: 11/09/2013 Document  Reviewed: 09/08/2013 [facility] Patient Information 2015 [place], [place]. This information is not intended to replace advice given to you by your health care provider. Make sure you discuss any questions you have with your health care provider.

## 2014-10-21 NOTE — Telephone Encounter (Signed)
Follow up      Home health received order for oxygen, however, it is not completed and they cannot get oxygen to pt until they get more info.  Please call

## 2014-10-21 NOTE — Telephone Encounter (Signed)
Will forward to [employer] PA (patient seen by her today) and Delsa Sale CMA

## 2014-10-21 NOTE — Progress Notes (Addendum)
11/01/2014 Pao Haffey Else   September 11, 1933  [id number]  Primary Physician [name], MD Primary Cardiologist: Dr. Mare Ferrari  HPI:  Sean Figueroa is a patient of Dr. Sherryl Barters who presents to clinic today for post hospital follow-up. He is a 80 y.o. male with a past medical history of CAD with remote apical infarct and underwent high risk PCI to left main in 4098, chronic systolic heart failure with baseline EF 45-50%, ischemic cardiomyopathy, COPD, hypertension, hyperlipidemia and a remote history of apical LV thrombus on Coumadin, who presented to [facility] on 10/06/14 with dyspnea and right leg pain. He was found to be in acute heart failure and there was concern for right lower extremity cellulitis.  He was diuresed with IV Lasix and lost 5 pounds during his hospital stay. As his volume status improved, his respiratory status improved. His renal function and electrolytes were followed closely and his potassium was supplemented as needed.  His right leg improved on antibiotics and he was instructed to complete antibiotic course as an outpatient.  Due to recent history of falls at home and acute heart failure exacerbation, a HHRN, PT/OT were ordered at time of discharge. He was also sent home on home O2 as well.  By 11/21, his respiratory status, weight and activity level were approaching baseline. He was seen by Dr. Marlou Porch and all data were reviewed. No further inpatient workup was indicated and he is considered stable for discharge. His discharge weight was 173 pounds.  Today in clinic, he reports that he has done fairly well since discharge. He has mild dyspnea with exertional activities. His condition improves when he uses his supplemental home O2. He uses 3 L with ambulation. He dose not require any supplemental O2 at rest. He denies any resting dyspnea, orthopnea, PND or weight gain. He reports full compliance with daily weights and adherence to a low sodium diet. He reports full compliance  with his medications.  He has completed his course of antibiotics for his cellulitis. He denies any further pain or swelling. No fevers or chills.   Current Outpatient Prescriptions  Medication Sig Dispense Refill  . allopurinol (ZYLOPRIM) 100 MG tablet Take 100 mg by mouth daily.     Marland Kitchen ALPRAZolam (XANAX) 0.25 MG tablet TAKE 1 TABLET TWICE DAILY AS NEEDED 120 tablet 1  . aspirin 81 MG tablet Take 81 mg by mouth daily.      . calcium carbonate 200 MG capsule Take 500 mg by mouth daily.      . clopidogrel (PLAVIX) 75 MG tablet TAKE 1 TABLET EVERY DAY WITH BREAKFAST 90 tablet 0  . CRESTOR 10 MG tablet TAKE 1 TABLET (10 MG TOTAL) BY MOUTH DAILY. 90 tablet 0  . [employer] D3 1000 UNITS capsule Take 1,000 Units by mouth daily.  5  . fish oil-omega-3 fatty acids 1000 MG capsule Take 2 g by mouth daily.      . Fluticasone-Salmeterol (ADVAIR DISKUS) 100-50 MCG/DOSE AEPB Inhale 1 puff into the lungs 2 (two) times daily. 60 each 5  . furosemide (LASIX) 40 MG tablet Take 2 tablets (80 mg total) by mouth 2 (two) times daily. 120 tablet 11  . guaiFENesin (MUCINEX) 600 MG 12 hr tablet Take 1,200 mg by mouth 2 (two) times daily as needed.      . irbesartan (AVAPRO) 75 MG tablet Take 1 tablet (75 mg total) by mouth daily. 30 tablet 11  . isosorbide mononitrate (IMDUR) 60 MG 24 hr tablet TAKE 1 TABLET (60 MG  TOTAL) BY MOUTH 2 (TWO) TIMES DAILY. 180 tablet 1  . meclizine (ANTIVERT) 25 MG tablet TAKE 1 TABLET (25 MG TOTAL) BY MOUTH 3 (THREE) TIMES DAILY AS NEEDED FOR DIZZINESS. FOR DIZZINESS 30 tablet 3  . metoprolol (LOPRESSOR) 50 MG tablet TAKE 1 TABLET (50 MG TOTAL) BY MOUTH 2 (TWO) TIMES DAILY. 180 tablet 3  . nitroGLYCERIN (NITROSTAT) 0.4 MG SL tablet Place 1 tablet (0.4 mg total) under the tongue every 5 (five) minutes as needed. 25 tablet 3  . potassium chloride SA (K-DUR,KLOR-CON) 20 MEQ tablet Take 1 tablet (20 mEq total) by mouth 2 (two) times daily. 90 tablet 3  . PROAIR HFA 108 (90 BASE) MCG/ACT inhaler  INHALE 2 PUFFS INTO LUNGS EVERY 6 HOURS AS NEEDED FOR WHEEZING 8.5 each 5  . RANEXA 500 MG 12 hr tablet TAKE 1 TABLET TWICE DAILY 180 tablet 3  . sodium bicarbonate 650 MG tablet Take 650 mg by mouth daily.     . temazepam (RESTORIL) 30 MG capsule TAKE ONE CAPSULE BY MOUTH AT BEDTIME AS NEEDED 90 capsule 1  . THEO-24 300 MG 24 hr capsule TAKE 1 CAPSULE (300 MG TOTAL) BY MOUTH DAILY. 30 capsule 1  . tiotropium (SPIRIVA HANDIHALER) 18 MCG inhalation capsule PLACE 1 CAPSULE (18 MCG TOTAL) INTO INHALER AND INHALE DAILY. 30 capsule 5  . traMADol (ULTRAM) 50 MG tablet Take 50 mg by mouth every 6 (six) hours as needed for moderate pain.      No current facility-administered medications for this visit.    Allergies  Allergen Reactions  . Indomethacin     unknown  . Lipitor [Atorvastatin Calcium]     Leg pain  . Pravachol     Leg pain  . Procardia [Nifedipine]   . Zaroxolyn [Metolazone]     History   Social History  . Marital Status: Widowed    Spouse Name: N/A    Number of Children: N/A  . Years of Education: N/A   Occupational History  . Not on file.   Social History Main Topics  . Smoking status: Current Every Day Smoker -- 0.75 packs/day for 65 years    Types: Cigarettes  . Smokeless tobacco: Never Used  . Alcohol Use: Yes     Comment: "quit drinking in the 1990's"  . Drug Use: No  . Sexual Activity: Not Currently   Other Topics Concern  . Not on file   Social History Narrative     Review of Systems: General: negative for chills, fever, night sweats or weight changes.  Cardiovascular: negative for chest pain, dyspnea on exertion, edema, orthopnea, palpitations, paroxysmal nocturnal dyspnea or shortness of breath Dermatological: negative for rash Respiratory: negative for cough or wheezing Urologic: negative for hematuria Abdominal: negative for nausea, vomiting, diarrhea, bright red blood per rectum, melena, or hematemesis Neurologic: negative for visual changes,  syncope, or dizziness All other systems reviewed and are otherwise negative except as noted above.    Blood pressure 98/58, pulse 52, height 5\' 11"  (1.803 m), weight 177 lb 12.8 oz (80.65 kg), SpO2 84 %.  General appearance: alert, cooperative and no distress Neck: no carotid bruit and no JVD Lungs: clear to auscultation bilaterally Heart: regular rate and rhythm, S1, S2 normal, no murmur, click, rub or gallop Extremities: Trace lower extremity edema billaterally; right lower extremity without signs of cellulitis Pulses: 2+ and symmetric Skin: Warm and dry Neurologic: Grossly normal  EKG sinus bradycardia. heart rate 52 bpm. No skin make abnormalities.  ASSESSMENT  AND PLAN:   1. Chronic systolic CHF: EF 44-62% on recent echo. Acute exacerbation resolved. He appears euvolemic on physical exam. His weight today in the office is 177 pounds (was 174 day of discharge). He denies any resting dyspnea, orthopnea, PND, weight gain or lower extremity edema at home. Continue current dose of Lasix.  Will check a BMET  today to assess renal function and electrolytes. Continue daily weights and a low sodium diet. Continue beta blocker and ARB. O2 sats 84 % on RA. He will need to continue supplemental home O2. Order has been placed for Home Health to continue with home O2.   2. Right lower extremity cellulitis: Resolved. No further erythema, pain or swelling. No fevers or chills. He completed full course of antibiotics.   PLAN continue current plan of care. Patient instructed to follow-up with Dr. Mare Ferrari in 3 months for reassessment.  [place], [place] 11/01/2014 12:03 PM

## 2014-10-23 LAB — BASIC METABOLIC PANEL
BUN: 28 mg/dL — ABNORMAL HIGH (ref 6–23)
CALCIUM: 8.9 mg/dL (ref 8.4–10.5)
CO2: 33 mEq/L — ABNORMAL HIGH (ref 19–32)
Chloride: 96 mEq/L (ref 96–112)
Creatinine, Ser: 1.4 mg/dL (ref 0.4–1.5)
GFR: 50.24 mL/min — ABNORMAL LOW (ref >60.00)
GLUCOSE: 100 mg/dL — AB (ref 70–99)
POTASSIUM: 4.5 meq/L (ref 3.5–5.1)
SODIUM: 136 meq/L (ref 135–145)

## 2014-10-24 NOTE — Progress Notes (Signed)
Quick Note:  Please report to patient. The recent labs are stable. Continue same medication and careful diet. The kidneys are drier. Be sure to drink plenty of water. ______

## 2014-10-27 ENCOUNTER — Encounter (HOSPITAL_COMMUNITY): Payer: Self-pay | Admitting: Cardiovascular Disease

## 2014-10-27 ENCOUNTER — Telehealth: Payer: Self-pay | Admitting: Cardiology

## 2014-10-27 NOTE — Telephone Encounter (Signed)
Spoke with Randall Hiss from [facility] respiratory  service regarding pt needing the 02 saturation, so oxygen can be dispense. Randall Hiss is aware that on the last O/V on 10/21/14/ with the PA; oxygen saturation  was not checked. Randall Hiss did not know who had order in home oxygen on pt. Randall Hiss is aware that patient is F/U by pulmonary medicine Dr Chase Caller and oxygen saturation was measures on their office visits.

## 2014-10-27 NOTE — Telephone Encounter (Signed)
F/U   Daria from [facility] calling back states pt being serviced by [facility] and the information about saturation levels needs to be sent there. [facility] telephone is [phone] and fax # [phone].

## 2014-10-27 NOTE — Telephone Encounter (Signed)
New Msg   Daria with [facility] calling to get saturation levels for pt. States there was order for oxygen but not enough info, please contact at [phone] ext [phone]

## 2014-10-28 ENCOUNTER — Telehealth: Payer: Self-pay | Admitting: Cardiology

## 2014-10-28 NOTE — Telephone Encounter (Signed)
Spoke with Sean Figueroa and advised order faxed She stated Medicare requires O2 sat be in office note not just on Rx. Waiting on addendum to be done

## 2014-10-28 NOTE — Telephone Encounter (Signed)
New Msg   Bryson Ha of [facility] [phone] ext [phone] needing Saturation levels for pt. Pt will now be receiving oxygen care from  [facility] and he is switching from Nocturnal Oxygen to continuous.  Please contact with info.

## 2014-10-31 ENCOUNTER — Telehealth: Payer: Self-pay | Admitting: Cardiology

## 2014-10-31 NOTE — Telephone Encounter (Signed)
New message        Pt would like to know an update on his oxygen tanks

## 2014-10-31 NOTE — Telephone Encounter (Signed)
Follow Up   Pt is calling to follow up on oxygen tank need. Pt feels he needs smaller oxygen tanks to get around. Please call.

## 2014-10-31 NOTE — Telephone Encounter (Signed)
Left message to call back  

## 2014-11-01 ENCOUNTER — Encounter: Payer: Self-pay | Admitting: Cardiology

## 2014-11-01 NOTE — Telephone Encounter (Signed)
New Prob    Nurse is requesting a few more things for pt. Requesting a call back from nurse.

## 2014-11-01 NOTE — Telephone Encounter (Signed)
Addendum has been done to office visit from 10/21/14 Faxed to [facility]

## 2014-11-01 NOTE — Telephone Encounter (Signed)
Left message for patient to call back   Spoke with Melissa and refaxed information stating patients O2 Sat done on room air at rest

## 2014-11-02 NOTE — Telephone Encounter (Signed)
Spoke with patient and he heard from [facility] and they will be bringing out his oxygen today

## 2014-11-12 ENCOUNTER — Other Ambulatory Visit: Payer: Self-pay | Admitting: Cardiology

## 2014-11-14 ENCOUNTER — Other Ambulatory Visit: Payer: Medicare Other

## 2014-11-14 ENCOUNTER — Ambulatory Visit: Payer: Medicare Other | Admitting: Cardiology

## 2014-11-14 NOTE — Telephone Encounter (Signed)
Rx refill sent to patient pharmacy   

## 2014-11-15 ENCOUNTER — Other Ambulatory Visit (INDEPENDENT_AMBULATORY_CARE_PROVIDER_SITE_OTHER): Payer: Medicare Other | Admitting: *Deleted

## 2014-11-15 DIAGNOSIS — E7800 Pure hypercholesterolemia, unspecified: Secondary | ICD-10-CM

## 2014-11-15 DIAGNOSIS — I480 Paroxysmal atrial fibrillation: Secondary | ICD-10-CM

## 2014-11-15 DIAGNOSIS — I200 Unstable angina: Secondary | ICD-10-CM

## 2014-11-15 DIAGNOSIS — I2510 Atherosclerotic heart disease of native coronary artery without angina pectoris: Secondary | ICD-10-CM

## 2014-11-15 LAB — HEPATIC FUNCTION PANEL
ALBUMIN: 3 g/dL — AB (ref 3.5–5.2)
ALT: 13 U/L (ref 0–53)
AST: 15 U/L (ref 0–37)
Alkaline Phosphatase: 79 U/L (ref 39–117)
BILIRUBIN TOTAL: 0.5 mg/dL (ref 0.2–1.2)
Bilirubin, Direct: 0.1 mg/dL (ref 0.0–0.3)
Total Protein: 6.3 g/dL (ref 6.0–8.3)

## 2014-11-15 LAB — LIPID PANEL
Cholesterol: 110 mg/dL (ref 0–200)
HDL: 44.5 mg/dL (ref >39.00)
LDL CALC: 56 mg/dL (ref 0–99)
NONHDL: 65.5
Total CHOL/HDL Ratio: 2
Triglycerides: 50 mg/dL (ref 0.0–149.0)
VLDL: 10 mg/dL (ref 0.0–40.0)

## 2014-11-15 LAB — BASIC METABOLIC PANEL
BUN: 23 mg/dL (ref 6–23)
CALCIUM: 8.6 mg/dL (ref 8.4–10.5)
CO2: 33 mEq/L — ABNORMAL HIGH (ref 19–32)
CREATININE: 1.1 mg/dL (ref 0.4–1.5)
Chloride: 101 mEq/L (ref 96–112)
GFR: 68.55 mL/min (ref >60.00)
GLUCOSE: 107 mg/dL — AB (ref 70–99)
POTASSIUM: 4 meq/L (ref 3.5–5.1)
Sodium: 140 mEq/L (ref 135–145)

## 2014-11-16 ENCOUNTER — Other Ambulatory Visit: Payer: Self-pay | Admitting: Cardiology

## 2014-11-19 ENCOUNTER — Other Ambulatory Visit: Payer: Self-pay | Admitting: Cardiology

## 2014-11-21 ENCOUNTER — Encounter: Payer: Self-pay | Admitting: Internal Medicine

## 2014-11-21 ENCOUNTER — Ambulatory Visit: Payer: Medicare Other | Admitting: Internal Medicine

## 2014-12-13 ENCOUNTER — Other Ambulatory Visit: Payer: Self-pay | Admitting: Cardiology

## 2014-12-13 ENCOUNTER — Ambulatory Visit: Payer: Self-pay | Admitting: Internal Medicine

## 2014-12-13 DIAGNOSIS — G4700 Insomnia, unspecified: Secondary | ICD-10-CM

## 2014-12-15 ENCOUNTER — Other Ambulatory Visit: Payer: Self-pay | Admitting: Cardiology

## 2014-12-15 DIAGNOSIS — G4700 Insomnia, unspecified: Secondary | ICD-10-CM

## 2014-12-21 ENCOUNTER — Telehealth: Payer: Self-pay | Admitting: Cardiology

## 2014-12-21 ENCOUNTER — Encounter: Payer: Self-pay | Admitting: Cardiology

## 2014-12-21 ENCOUNTER — Ambulatory Visit (INDEPENDENT_AMBULATORY_CARE_PROVIDER_SITE_OTHER): Payer: Medicare Other | Admitting: Cardiology

## 2014-12-21 VITALS — BP 118/76 | HR 78 | Ht 71.0 in | Wt 176.0 lb

## 2014-12-21 DIAGNOSIS — R42 Dizziness and giddiness: Secondary | ICD-10-CM

## 2014-12-21 DIAGNOSIS — I5023 Acute on chronic systolic (congestive) heart failure: Secondary | ICD-10-CM

## 2014-12-21 DIAGNOSIS — I119 Hypertensive heart disease without heart failure: Secondary | ICD-10-CM

## 2014-12-21 DIAGNOSIS — I259 Chronic ischemic heart disease, unspecified: Secondary | ICD-10-CM

## 2014-12-21 NOTE — Telephone Encounter (Signed)
Spoke with patient regarding medication to be d/c after today's visit Patient reviewed his medications and has not been taking Avapro (namebrand or generic) Discussed with  Dr. Mare Ferrari and will have patient decrease his Imdur 60 mg to once a day Advised patient, verbalized understanding

## 2014-12-21 NOTE — Telephone Encounter (Signed)
New Msg         Pt calling, states he was informed to stop taking medication AVAPRO at appt this morning.   Pt isn't familiar with this medication and would like a call back.

## 2014-12-21 NOTE — Progress Notes (Signed)
Cardiology Office Note   Date:  12/21/2014   ID:  YAHYE SIEBERT, DOB 05/21/1933, MRN [id number]  PCP:  [name], MD  Cardiologist:   Darlin Coco, MD   No chief complaint on file.     History of Present Illness: Sean Figueroa is a 81 y.o. male who presents for follow-up office visit.  This pleasant 82 year old gentleman is seen for a scheduled followup office visit. He has ischemic heart disease. He had a history of a remote apical myocardial infarction. He developed crescendo angina in March 2013. He underwent high risk drug-eluting stent placement to the circumflex and a drug-eluting stent to the left main with support of the impellla device by Dr. Burt Knack. He is now on long-term dual antiplatelet therapy with aspirin and Plavix. Since last visit he has been doing well from the cardiac standpoint. He has not had to take nitroglycerin more than once or twice since he had his stents. He has had no chest pain recently. He continues to smoke and he is trying to quit. The patient presented to [facility] on 10/06/14 with dyspnea and right leg pain. He was found to be in acute heart failure and there was concern for right lower extremity cellulitis.  He was diuresed with IV Lasix and lost 5 pounds during his hospital stay. As his volume status improved, his respiratory status improved. His renal function and electrolytes were followed closely and his potassium was supplemented as needed.  His right leg improved on antibiotics and he was instructed to complete antibiotic course as an outpatient.  Due to recent history of falls at home and acute heart failure exacerbation, a HHRN, PT/OT were ordered at time of discharge. He was also sent home on home O2 as well. He returns today with a neighbor who will become his healthcare power of attorney soon.  The patient's only living relative is a daughter who is in a nursing home and is in very poor health. The neighbor reports that the patient has been  complaining of dizzy spells and he has had several recent falls.  Patient states that 30 minutes after he takes his morning medicine he feels very dizzy.  Complains of weakness in his legs.  Past Medical History  Diagnosis Date  . 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 Has chronic angina; S/P DES to the LCX and DES to the L MAIN 12/2011. Now on Plavix and ASA.   Marland Kitchen Hyperlipidemia   . Hypertension   . Long-term (current) use of anticoagulants     For hx of LV apical aneurysm  . LVH (left ventricular hypertrophy)   . Atrial fibrillation     Unclear history  . Tobacco abuse   . Systolic heart failure     EF is 35% per cath 12/2011  . Myocardial infarction 1981    [name] 01/14/2012  . Left ventricular apical thrombus     hx/notes 10/06/2014  . On home oxygen therapy     "2.5L 24/7" (10/06/2014)  . COPD (chronic obstructive pulmonary disease)   . Pneumonia "several times"  . Arthritis     "shoulders; back" (10/06/2014)  . History of gout   . Chronic back pain     "anytime it's wet and cold" (10/06/2014)    Past Surgical History  Procedure Laterality Date  . Cholecystectomy    . Hernia repair    .  Coronary angioplasty with stent placement  12/2011    LCX and L main - drug eluting  . Cardiac catheterization  1981    [name] 01/14/2012  . Left heart catheterization with coronary angiogram N/A 01/16/2012    Procedure: LEFT HEART CATHETERIZATION WITH CORONARY ANGIOGRAM;  Surgeon: Sherren Mocha, MD;  Location: [facility] CATH LAB;  Service: Cardiovascular;  Laterality: N/A;  . Percutaneous coronary stent intervention (pci-s) N/A 01/20/2012    Procedure: PERCUTANEOUS CORONARY STENT INTERVENTION (PCI-S);  Surgeon: Sherren Mocha, MD;  Location: [facility] CATH LAB;  Service: Cardiovascular;  Laterality: N/A;     Current Outpatient Prescriptions  Medication Sig Dispense Refill  . allopurinol  (ZYLOPRIM) 100 MG tablet Take 100 mg by mouth daily.     Marland Kitchen ALPRAZolam (XANAX) 0.25 MG tablet TAKE 1 TABLET TWICE DAILY AS NEEDED 120 tablet 1  . aspirin 81 MG tablet Take 81 mg by mouth daily.      . calcium carbonate 200 MG capsule Take 500 mg by mouth daily.      . clopidogrel (PLAVIX) 75 MG tablet TAKE 1 TABLET EVERY DAY WITH BREAKFAST 90 tablet 1  . CRESTOR 10 MG tablet TAKE 1 TABLET (10 MG TOTAL) BY MOUTH DAILY. 90 tablet 0  . [employer]S D3 1000 UNITS capsule Take 1,000 Units by mouth daily.  5  . fish oil-omega-3 fatty acids 1000 MG capsule Take 2 g by mouth daily.      . Fluticasone-Salmeterol (ADVAIR DISKUS) 100-50 MCG/DOSE AEPB Inhale 1 puff into the lungs 2 (two) times daily. 60 each 5  . furosemide (LASIX) 40 MG tablet Take 2 tablets (80 mg total) by mouth 2 (two) times daily. 120 tablet 11  . guaiFENesin (MUCINEX) 600 MG 12 hr tablet Take 1,200 mg by mouth 2 (two) times daily as needed.      . isosorbide mononitrate (IMDUR) 60 MG 24 hr tablet TAKE 1 TABLET (60 MG TOTAL) BY MOUTH 2 (TWO) TIMES DAILY. 180 tablet 1  . meclizine (ANTIVERT) 25 MG tablet TAKE 1 TABLET (25 MG TOTAL) BY MOUTH 3 (THREE) TIMES DAILY AS NEEDED FOR DIZZINESS. FOR DIZZINESS 30 tablet 3  . metoprolol (LOPRESSOR) 50 MG tablet TAKE 1 TABLET (50 MG TOTAL) BY MOUTH 2 (TWO) TIMES DAILY. 180 tablet 3  . nitroGLYCERIN (NITROSTAT) 0.4 MG SL tablet Place 1 tablet (0.4 mg total) under the tongue every 5 (five) minutes as needed. 25 tablet 3  . potassium chloride SA (K-DUR,KLOR-CON) 20 MEQ tablet Take 1 tablet (20 mEq total) by mouth 2 (two) times daily. 90 tablet 3  . PROAIR HFA 108 (90 BASE) MCG/ACT inhaler INHALE 2 PUFFS INTO LUNGS EVERY 6 HOURS AS NEEDED FOR WHEEZING 8.5 each 5  . RANEXA 500 MG 12 hr tablet TAKE 1 TABLET TWICE DAILY 180 tablet 3  . sodium bicarbonate 650 MG tablet Take 650 mg by mouth daily.     . temazepam (RESTORIL) 30 MG capsule TAKE ONE CAPSULE BY MOUTH AT BEDTIME AS NEEDED 90 capsule 1  . THEO-24 300 MG 24  hr capsule TAKE 1 CAPSULE (300 MG TOTAL) BY MOUTH DAILY. 30 capsule 1  . tiotropium (SPIRIVA HANDIHALER) 18 MCG inhalation capsule PLACE 1 CAPSULE (18 MCG TOTAL) INTO INHALER AND INHALE DAILY. 30 capsule 5  . traMADol (ULTRAM) 50 MG tablet Take 50 mg by mouth every 6 (six) hours as needed for moderate pain.      No current facility-administered medications for this visit.    Allergies:   Indomethacin; Lipitor; Pravachol; Procardia;  and Zaroxolyn    Social History:  The patient  reports that he has been smoking Cigarettes.  He has a 48.75 pack-year smoking history. He has never used smokeless tobacco. He reports that he drinks alcohol. He reports that he does not use illicit drugs.   Family History:  The patient's family history includes Cerebral palsy in his daughter; Heart disease in his mother; Hypertension in his mother.    ROS:  Please see the history of present illness.   Otherwise, review of systems are positive for none.   All other systems are reviewed and negative.    PHYSICAL EXAM: VS:  BP 118/76 mmHg  Pulse 78  Ht 5\' 11"  (1.803 m)  Wt 176 lb (79.833 kg)  BMI 24.56 kg/m2 , BMI Body mass index is 24.56 kg/(m^2). GEN: Well nourished, well developed, in no acute distress HEENT: normal Neck: no JVD, carotid bruits, or masses Cardiac: RRR; no murmurs, rubs, or gallops, mild edema  Respiratory:  clear to auscultation bilaterally, normal work of breathing.  The breath sounds are decreased at the left base. GI: soft, nontender, nondistended, + BS MS: no deformity or atrophy Skin: warm and dry, no rash Neuro:  Strength and sensation are intact.  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  His blood pressure is 100/60 in both arms sitting up.  His left radial pulse is weaker than his right radial pulse.  EKG:  EKG is not ordered today.    Recent Labs: 10/06/2014: Hemoglobin 15.2; Platelets 165; Pro  B Natriuretic peptide (BNP) 1522.0* 11/15/2014: ALT 13; BUN 23; Creatinine 1.1; Potassium 4.0; Sodium 140    Lipid Panel    Component Value Date/Time   CHOL 110 11/15/2014 1120   TRIG 50.0 11/15/2014 1120   HDL 44.50 11/15/2014 1120   CHOLHDL 2 11/15/2014 1120   VLDL 10.0 11/15/2014 1120   LDLCALC 56 11/15/2014 1120      Wt Readings from Last 3 Encounters:  12/21/14 176 lb (79.833 kg)  10/21/14 177 lb 12.8 oz (80.65 kg)  10/08/14 173 lb (78.472 kg)      Other studies Reviewed:    ASSESSMENT AND PLAN:  1. Ischemic heart disease status post remote apical myocardial infarction and more recently status post drug-eluting stent placement in March 2013 to the circumflex and drug-eluting stent to the left main coronary artery. The patient is on long-term dual antiplatelet therapy. 2. COPD with ongoing tobacco abuse 3. Hypercholesterolemia 4. past history of gout, on allopurinol 5. Osteoarthritis 6.  Dizziness and history of falls 7.  Borderline low blood pressure  Current medicines are reviewed at length with the patient today.  The patient has concerns regarding medicines.  The following changes have been made:  Because of his low blood pressure and has complaints of dizziness after taking his medication we will stop his Avapro now.  This will allow a higher ambient blood pressure and hopefully will prevent future falls    Orders Placed This Encounter  Procedures  . Basic metabolic panel  . CBC with Differential/Platelet     Disposition:   FU with Dr. Mare Ferrari in 3 months for office visit, EKG, CBC, and basal metabolic panel.   Signed, Darlin Coco, MD  12/21/2014 12:08 PM    [facility] [address], [place], [place]  [address] Phone: [phone]; Fax: [phone]

## 2014-12-21 NOTE — Patient Instructions (Signed)
STOP AVAPRO   Your physician recommends that you schedule a follow-up appointment in: 3 MONTH OV/EKG/CBC/BMET

## 2014-12-23 ENCOUNTER — Ambulatory Visit: Payer: Self-pay | Admitting: Internal Medicine

## 2015-01-09 ENCOUNTER — Telehealth: Payer: Self-pay | Admitting: Cardiology

## 2015-01-09 NOTE — Telephone Encounter (Signed)
Calling stating he has received a letter from [facility] stating they will no longer cover his Theo-24 300 mg daily.  States they recommend switching to another medication.  [employer]'s phone # is [phone] and his ID is [id number].  States he has a 30 day supply now. Advised that Dr. Mare Ferrari is not in the office today but will forward message to him and Rip Harbour Pratt,LPN.

## 2015-01-09 NOTE — Telephone Encounter (Signed)
New message      Pt c/o medication issue:  1. Name of Medication: Theo-24 2. How are you currently taking this medication (dosage and times per day)? 300mg  daily 3. Are you having a reaction (difficulty breathing--STAT)?no 4. What is your medication issue? Insurance company will no longer pay for this medication.  What else can he take

## 2015-01-10 NOTE — Telephone Encounter (Signed)
Okay to switch to generic preferred by [employer]

## 2015-01-12 NOTE — Telephone Encounter (Signed)
Phone does not seem to be working, will continue to to try reach patient

## 2015-01-17 NOTE — Telephone Encounter (Signed)
Spoke with patient and he had no information Discussed further with  Dr. Mare Ferrari and will have patient follow up with his Pulmonologist regarding his [facility] patient, verbalized understanding

## 2015-01-23 ENCOUNTER — Ambulatory Visit: Payer: Medicare Other | Admitting: Cardiology

## 2015-01-24 ENCOUNTER — Other Ambulatory Visit: Payer: Self-pay

## 2015-01-24 ENCOUNTER — Telehealth: Payer: Self-pay

## 2015-01-24 NOTE — Telephone Encounter (Signed)
Advised patient per  Dr. Mare Ferrari to contact Pulmonologist regarding his Sean Figueroa and what be recommended in place of this

## 2015-01-27 ENCOUNTER — Telehealth: Payer: Self-pay | Admitting: Internal Medicine

## 2015-01-27 NOTE — Telephone Encounter (Signed)
Called spoke with pt. He reports the theophylline is too expensive and can't afford. He wants to know if MR has an alternative. Please advise thanks

## 2015-01-30 NOTE — Telephone Encounter (Signed)
Patient notified.  Nothing further needed. 

## 2015-01-30 NOTE — Telephone Encounter (Signed)
There is no need for theo in setting of copd. Continue spiriva and advair as before  Dr. Brand Males, M.D., [facility].C.P Pulmonary and Critical Care Medicine Staff Physician [facility] Pager: [phone], If no answer or between  15:00h - 7:00h: call [phone]  01/30/2015 3:33 PM

## 2015-02-09 ENCOUNTER — Other Ambulatory Visit: Payer: Self-pay | Admitting: Cardiology

## 2015-02-09 DIAGNOSIS — F419 Anxiety disorder, unspecified: Secondary | ICD-10-CM

## 2015-02-13 ENCOUNTER — Other Ambulatory Visit: Payer: Self-pay | Admitting: Cardiology

## 2015-02-16 ENCOUNTER — Other Ambulatory Visit: Payer: Self-pay | Admitting: Cardiology

## 2015-02-16 ENCOUNTER — Encounter: Payer: Self-pay | Admitting: Internal Medicine

## 2015-02-16 ENCOUNTER — Ambulatory Visit (INDEPENDENT_AMBULATORY_CARE_PROVIDER_SITE_OTHER): Payer: Medicare Other | Admitting: Internal Medicine

## 2015-02-16 VITALS — BP 142/78 | HR 61 | Ht 71.0 in | Wt 179.0 lb

## 2015-02-16 DIAGNOSIS — R5381 Other malaise: Secondary | ICD-10-CM | POA: Diagnosis not present

## 2015-02-16 DIAGNOSIS — F17200 Nicotine dependence, unspecified, uncomplicated: Secondary | ICD-10-CM | POA: Insufficient documentation

## 2015-02-16 DIAGNOSIS — Z720 Tobacco use: Secondary | ICD-10-CM | POA: Diagnosis not present

## 2015-02-16 DIAGNOSIS — J449 Chronic obstructive pulmonary disease, unspecified: Secondary | ICD-10-CM | POA: Diagnosis not present

## 2015-02-16 MED ORDER — IPRATROPIUM BROMIDE 0.02 % IN SOLN: RESPIRATORY_TRACT | Status: DC

## 2015-02-16 MED ORDER — ALBUTEROL SULFATE (2.5 MG/3ML) 0.083% IN NEBU: 2.5000 mg | INHALATION_SOLUTION | Freq: Four times a day (QID) | RESPIRATORY_TRACT | Status: DC | PRN

## 2015-02-16 NOTE — Progress Notes (Signed)
Subjective:    Patient ID: Sean Figueroa, male    DOB: 11-May-1933, 81 y.o.   MRN: [id number]  HPI  #Severe COPD a PFT 2015:  that showed FEV1 31%, ratio 42, no sign  bronchodilator response, FVC 52%, diffusing capacity was unable to be completed. +Air trapping .  He remains on Advair and Spiriva .   #SMoking He continues to smoke, advised on smoking cessation   #Chronic systolic CHF ef 17%  #Imaging CT May 2014 - no nodule or cancer   OV 02/16/2015  Chief Complaint  Patient presents with  . Follow-up    Pt stated his breathing has improved since last OV. Pt stated the advair is helping. Pt c/o prod cough with yellow mucus and chest tightness when coughing. Pt quesitoning if he should be on neb meds.     81 year old male with severe COPD. Presents for follow-up. Overall he reports stable disease. However he continues to smoke. He also reports significant exertional dyspnea and dizziness and physical deconditioning that is ongoing. He wants to be better from this even though he has improved from several months ago. He continues with the Spiriva and Advair diligently but is finding the cost of medications expensive. He wants to switch to nebulizer therapy. Medication list shows theophylline but due to cost is not taking this [he does not know that this medication is ineffective in COPD and has significant toxicity]. He continues to smoke and is reluctant to quit    has a past medical history of Coronary artery disease; Hyperlipidemia; Hypertension; Long-term (current) use of anticoagulants; LVH (left ventricular hypertrophy); Atrial fibrillation; Tobacco abuse; Systolic heart failure; Myocardial infarction (1981); Left ventricular apical thrombus; On home oxygen therapy; COPD (chronic obstructive pulmonary disease); Pneumonia ("several times"); Arthritis; History of gout; and Chronic back pain.   reports that he has been smoking Cigarettes.  He has a 48.75 pack-year smoking history. He  has never used smokeless tobacco.  Past Surgical History  Procedure Laterality Date  . Cholecystectomy    . Hernia repair    . Coronary angioplasty with stent placement  12/2011    LCX and L main - drug eluting  . Cardiac catheterization  1981    [name] 01/14/2012  . Left heart catheterization with coronary angiogram N/A 01/16/2012    Procedure: LEFT HEART CATHETERIZATION WITH CORONARY ANGIOGRAM;  Surgeon: Sherren Mocha, MD;  Location: [facility] CATH LAB;  Service: Cardiovascular;  Laterality: N/A;  . Percutaneous coronary stent intervention (pci-s) N/A 01/20/2012    Procedure: PERCUTANEOUS CORONARY STENT INTERVENTION (PCI-S);  Surgeon: Sherren Mocha, MD;  Location: [facility] CATH LAB;  Service: Cardiovascular;  Laterality: N/A;    Allergies  Allergen Reactions  . Indomethacin     unknown  . Lipitor [Atorvastatin Calcium]     Leg pain  . Pravachol     Leg pain  . Procardia [Nifedipine]   . Zaroxolyn [Metolazone]     Immunization History  Administered Date(s) Administered  . Influenza Split 08/29/2014    Family History  Problem Relation Age of Onset  . Heart disease Mother   . Cerebral palsy Daughter   . Hypertension Mother      Current outpatient prescriptions:  .  allopurinol (ZYLOPRIM) 100 MG tablet, Take 100 mg by mouth daily. , Disp: , Rfl:  .  ALPRAZolam (XANAX) 0.25 MG tablet, TAKE 1 TABLET BY MOUTH TWICE A DAY AS NEEDED FOR ANXIETY, Disp: 120 tablet, Rfl: 1 .  aspirin 81 MG tablet, Take 81 mg  by mouth daily.  , Disp: , Rfl:  .  calcium carbonate 200 MG capsule, Take 500 mg by mouth daily.  , Disp: , Rfl:  .  clopidogrel (PLAVIX) 75 MG tablet, TAKE 1 TABLET EVERY DAY WITH BREAKFAST, Disp: 90 tablet, Rfl: 1 .  CRESTOR 10 MG tablet, TAKE 1 TABLET (10 MG TOTAL) BY MOUTH DAILY., Disp: 90 tablet, Rfl: 0 .  CVS D3 1000 UNITS capsule, Take 1,000 Units by mouth daily., Disp: , Rfl: 5 .  fish oil-omega-3 fatty acids 1000 MG capsule, Take 2 g by mouth daily.  , Disp: , Rfl:  .   Fluticasone-Salmeterol (ADVAIR DISKUS) 100-50 MCG/DOSE AEPB, Inhale 1 puff into the lungs 2 (two) times daily., Disp: 60 each, Rfl: 5 .  furosemide (LASIX) 40 MG tablet, Take 2 tablets (80 mg total) by mouth 2 (two) times daily., Disp: 120 tablet, Rfl: 11 .  guaiFENesin (MUCINEX) 600 MG 12 hr tablet, Take 1,200 mg by mouth 2 (two) times daily as needed.  , Disp: , Rfl:  .  isosorbide mononitrate (IMDUR) 60 MG 24 hr tablet, Take 60 mg by mouth daily., Disp: , Rfl:  .  meclizine (ANTIVERT) 25 MG tablet, TAKE 1 TABLET (25 MG TOTAL) BY MOUTH 3 (THREE) TIMES DAILY AS NEEDED FOR DIZZINESS. FOR DIZZINESS, Disp: 30 tablet, Rfl: 3 .  metoprolol (LOPRESSOR) 50 MG tablet, TAKE 1 TABLET (50 MG TOTAL) BY MOUTH 2 (TWO) TIMES DAILY., Disp: 180 tablet, Rfl: 3 .  nitroGLYCERIN (NITROSTAT) 0.4 MG SL tablet, Place 1 tablet (0.4 mg total) under the tongue every 5 (five) minutes as needed., Disp: 25 tablet, Rfl: 3 .  potassium chloride SA (K-DUR,KLOR-CON) 20 MEQ tablet, Take 1 tablet (20 mEq total) by mouth 2 (two) times daily., Disp: 90 tablet, Rfl: 3 .  PROAIR HFA 108 (90 BASE) MCG/ACT inhaler, INHALE 2 PUFFS INTO LUNGS EVERY 6 HOURS AS NEEDED FOR WHEEZING, Disp: 8.5 each, Rfl: 5 .  RANEXA 500 MG 12 hr tablet, TAKE 1 TABLET TWICE DAILY, Disp: 180 tablet, Rfl: 3 .  sodium bicarbonate 650 MG tablet, Take 650 mg by mouth daily. , Disp: , Rfl:  .  temazepam (RESTORIL) 30 MG capsule, TAKE ONE CAPSULE BY MOUTH AT BEDTIME AS NEEDED, Disp: 90 capsule, Rfl: 1 .  tiotropium (SPIRIVA HANDIHALER) 18 MCG inhalation capsule, PLACE 1 CAPSULE (18 MCG TOTAL) INTO INHALER AND INHALE DAILY., Disp: 30 capsule, Rfl: 5 .  traMADol (ULTRAM) 50 MG tablet, Take 50 mg by mouth every 6 (six) hours as needed for moderate pain. , Disp: , Rfl:  .  THEO-24 300 MG 24 hr capsule, TAKE 1 CAPSULE (300 MG TOTAL) BY MOUTH DAILY. (Patient not taking: Reported on 02/16/2015), Disp: 30 capsule, Rfl: 1    Review of Systems  Constitutional: Negative for  fever and unexpected weight change.  HENT: Positive for congestion. Negative for dental problem, ear pain, nosebleeds, postnasal drip, rhinorrhea, sinus pressure, sneezing, sore throat and trouble swallowing.   Eyes: Negative for redness and itching.  Respiratory: Positive for cough, chest tightness and shortness of breath. Negative for wheezing.   Cardiovascular: Negative for palpitations and leg swelling.  Gastrointestinal: Negative for nausea and vomiting.  Genitourinary: Negative for dysuria.  Musculoskeletal: Negative for joint swelling.  Skin: Negative for rash.  Neurological: Negative for headaches.  Hematological: Does not bruise/bleed easily.  Psychiatric/Behavioral: Negative for dysphoric mood. The patient is not nervous/anxious.        Objective:   Physical Exam  Constitutional: He is  oriented to person, place, and time. He appears well-developed and well-nourished. No distress.  Body mass index is 24.98 kg/(m^2).   HENT:  Head: Normocephalic and atraumatic.  Right Ear: External ear normal.  Left Ear: External ear normal.  Mouth/Throat: Oropharynx is clear and moist. No oropharyngeal exudate.  Smells of tobcco  Eyes: Conjunctivae and EOM are normal. Pupils are equal, round, and reactive to light. Right eye exhibits no discharge. Left eye exhibits no discharge. No scleral icterus.  Neck: Normal range of motion. Neck supple. No JVD present. No tracheal deviation present. No thyromegaly present.  Cardiovascular: Normal rate, regular rhythm and intact distal pulses.  Exam reveals no gallop and no friction rub.   No murmur heard. Pulmonary/Chest: Effort normal and breath sounds normal. No respiratory distress. He has no wheezes. He has no rales. He exhibits no tenderness.  barrell chest  Abdominal: Soft. Bowel sounds are normal. He exhibits no distension and no mass. There is no tenderness. There is no rebound and no guarding.  Musculoskeletal: Normal range of motion. He  exhibits no edema or tenderness.  Physical deconditioned Sitting in wheel chair  Lymphadenopathy:    He has no cervical adenopathy.  Neurological: He is alert and oriented to person, place, and time. He has normal reflexes. No cranial nerve deficit. Coordination normal.  Skin: Skin is warm and dry. No rash noted. He is not diaphoretic. No erythema. No pallor.  Psychiatric: He has a normal mood and affect. His behavior is normal. Judgment and thought content normal.  Nursing note and vitals reviewed.   Filed Vitals:   02/16/15 1413  BP: 142/78  Pulse: 61  Height: 5\' 11"  (1.803 m)  Weight: 179 lb (81.194 kg)  SpO2: 95%         Assessment & Plan:     ICD-9-CM ICD-10-CM   1. COPD, severe 496 J44.9   2. Physical deconditioning 799.3 R53.81   3. Smoking 305.1 Z72.0     Stable disease Due to cost - stop spiriva and advair Start duoneb 4-6 times daily scheduled  + albtuerol neb as needed Do not take theodur- glad you stopped it due to cost - wil remove from med list Start home PT - referral needed Quit smoking  Followup  6 weeks with NP to see progress    Dr. Brand Males, M.D., [facility].C.P Pulmonary and Critical Care Medicine Staff Physician [place] [facility] Pager: [phone], If no answer or between  15:00h - 7:00h: call [phone]  02/16/2015 2:43 PM

## 2015-02-16 NOTE — Patient Instructions (Addendum)
ICD-9-CM ICD-10-CM   1. COPD, severe 496 J44.9   2. Physical deconditioning 799.3 R53.81   3. Smoking 305.1 Z72.0     Stable disease Due to cost - stop spiriva and advair Start duoneb 4-6 times daily scheduled  + albtuerol neb as needed Do not take theodur- glad you stopped it due to cost - wil remove from med list Start home PT - referral needed Quit smoking  Followup  6 weeks with NP to see progress

## 2015-02-17 ENCOUNTER — Telehealth: Payer: Self-pay | Admitting: Internal Medicine

## 2015-02-17 DIAGNOSIS — R5381 Other malaise: Secondary | ICD-10-CM

## 2015-02-17 NOTE — Telephone Encounter (Signed)
Order was sent to [facility] 

## 2015-02-24 ENCOUNTER — Telehealth: Payer: Self-pay | Admitting: Internal Medicine

## 2015-02-24 NOTE — Telephone Encounter (Signed)
Patient called because he has not received his nebulizer.  I called [facility] and they told me that they sent a prescription request to our office needing to verify the dosage of the neb.  They faxed to wrong fax number, gave them correct fax number and asked them to fax it again.  They said that once it has been signed and faxed back to them, they can send patient the Neb.  Patient notified. Awaiting fax from [place].  To Daneil Dan to follow up

## 2015-02-24 NOTE — Telephone Encounter (Signed)
Kim at [facility] just faxed [place] CMN that needs to be signed & sent back to her. She sent to our [phone].

## 2015-02-26 ENCOUNTER — Other Ambulatory Visit: Payer: Self-pay | Admitting: Cardiology

## 2015-02-27 NOTE — Telephone Encounter (Signed)
This was signed by Chase Caller and faxed back, spoke with [facility], Christina who says they have signed cmn.  Verdie Mosher  They said they will contact patient in ref to this .Verdie Mosher

## 2015-02-27 NOTE — Telephone Encounter (Signed)
[facility], answering service answered and had to leave a message to call back.  Spoke with pt, advised him that we would call [facility] and look into the holdup.  Sean Dan do you have this CMN or does this need to be refaxed to our office?

## 2015-02-27 NOTE — Telephone Encounter (Signed)
Pt has not gotten his nebulizer machine yet. Please call pt back at [phone]

## 2015-02-27 NOTE — Telephone Encounter (Signed)
Alida, has this been faxed back yet? Thanks.

## 2015-03-16 ENCOUNTER — Other Ambulatory Visit: Payer: Self-pay

## 2015-03-21 ENCOUNTER — Other Ambulatory Visit (INDEPENDENT_AMBULATORY_CARE_PROVIDER_SITE_OTHER): Payer: Medicare Other | Admitting: *Deleted

## 2015-03-21 ENCOUNTER — Encounter: Payer: Self-pay | Admitting: Cardiology

## 2015-03-21 ENCOUNTER — Ambulatory Visit (INDEPENDENT_AMBULATORY_CARE_PROVIDER_SITE_OTHER): Payer: Medicare Other | Admitting: Cardiology

## 2015-03-21 VITALS — BP 126/50 | HR 60 | Ht 67.0 in | Wt 180.8 lb

## 2015-03-21 DIAGNOSIS — I5023 Acute on chronic systolic (congestive) heart failure: Secondary | ICD-10-CM | POA: Diagnosis not present

## 2015-03-21 DIAGNOSIS — I259 Chronic ischemic heart disease, unspecified: Secondary | ICD-10-CM

## 2015-03-21 DIAGNOSIS — I119 Hypertensive heart disease without heart failure: Secondary | ICD-10-CM

## 2015-03-21 DIAGNOSIS — J438 Other emphysema: Secondary | ICD-10-CM

## 2015-03-21 LAB — CBC WITH DIFFERENTIAL/PLATELET
Basophils Absolute: 0 10*3/uL (ref 0.0–0.1)
Basophils Relative: 0.4 % (ref 0.0–3.0)
Eosinophils Absolute: 0.5 10*3/uL (ref 0.0–0.7)
Eosinophils Relative: 7.6 % — ABNORMAL HIGH (ref 0.0–5.0)
HEMATOCRIT: 39.9 % (ref 39.0–52.0)
HEMOGLOBIN: 13.2 g/dL (ref 13.0–17.0)
LYMPHS ABS: 1.6 10*3/uL (ref 0.7–4.0)
Lymphocytes Relative: 25.4 % (ref 12.0–46.0)
MCHC: 33.2 g/dL (ref 30.0–36.0)
MCV: 102.6 fl — ABNORMAL HIGH (ref 78.0–100.0)
MONOS PCT: 8.3 % (ref 3.0–12.0)
Monocytes Absolute: 0.5 10*3/uL (ref 0.1–1.0)
NEUTROS ABS: 3.6 10*3/uL (ref 1.4–7.7)
Neutrophils Relative %: 58.3 % (ref 43.0–77.0)
Platelets: 174 10*3/uL (ref 150.0–400.0)
RBC: 3.89 Mil/uL — ABNORMAL LOW (ref 4.22–5.81)
RDW: 14.9 % (ref 11.5–15.5)
WBC: 6.3 10*3/uL (ref 4.0–10.5)

## 2015-03-21 LAB — BASIC METABOLIC PANEL
BUN: 12 mg/dL (ref 6–23)
CALCIUM: 8.8 mg/dL (ref 8.4–10.5)
CO2: 39 mEq/L — ABNORMAL HIGH (ref 19–32)
Chloride: 103 mEq/L (ref 96–112)
Creatinine, Ser: 0.81 mg/dL (ref 0.40–1.50)
GFR: 97.49 mL/min (ref >60.00)
Glucose, Bld: 104 mg/dL — ABNORMAL HIGH (ref 70–99)
Potassium: 3.5 mEq/L (ref 3.5–5.1)
Sodium: 144 mEq/L (ref 135–145)

## 2015-03-21 NOTE — Patient Instructions (Signed)
Medication Instructions:  Use generic Mucinex (Guiafenisen)  Labwork: none  Testing/Procedures: none  Follow-Up: Your physician recommends that you schedule a follow-up appointment in: 4 months with fasting labs (lp/bmet/hfp)

## 2015-03-21 NOTE — Addendum Note (Signed)
Addended by: Eulis Foster on: 03/21/2015 08:22 AM   Modules accepted: Orders

## 2015-03-21 NOTE — Progress Notes (Signed)
Cardiology Office Note   Date:  03/21/2015   ID:  Sean Figueroa, DOB 1932/12/05, MRN [id number]  PCP:  [name], MD  Cardiologist: Darlin Coco MD  No chief complaint on file.     History of Present Illness: Sean Figueroa is a 81 y.o. male who presents for follow-up office visit  This pleasant 81 year old gentleman is seen for a scheduled followup office visit. He has ischemic heart disease. He had a history of a remote apical myocardial infarction. He developed crescendo angina in March 2013. He underwent high risk drug-eluting stent placement to the circumflex and a drug-eluting stent to the left main with support of the impellla device by Dr. Burt Knack. He is now on long-term dual antiplatelet therapy with aspirin and Plavix. Since last visit he has been doing well from the cardiac standpoint. He has not had to take nitroglycerin more than once or twice since he had his stents. He has had no chest pain recently. He continues to smoke and he is trying to quit. The patient presented to [facility] on 10/06/14 with dyspnea and right leg pain. He was found to be in acute heart failure and there was concern for right lower extremity cellulitis.  He was diuresed with IV Lasix and lost 5 pounds during his hospital stay. As his volume status improved, his respiratory status improved. His renal function and electrolytes were followed closely and his potassium was supplemented as needed.  His right leg improved on antibiotics and he was instructed to complete antibiotic course as an outpatient.  Due to recent history of falls at home and acute heart failure exacerbation, a HHRN, PT/OT were ordered at time of discharge. He was also sent home on home O2 as well. He returns today with a neighbor who will become his healthcare power of attorney soon. The patient's only living relative is a daughter who is in a nursing home and is in very poor health. Since last visit the patient has been doing  reasonably well.  Unfortunately he still smokes occasionally.  He has had a cough for the past month.  He has received 2 different antibiotics from his PCP.  He has not been taking any Mucinex recently.   Past Medical History  Diagnosis Date  . 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 Has chronic angina; S/P DES to the LCX and DES to the L MAIN 12/2011. Now on Plavix and ASA.   Marland Kitchen Hyperlipidemia   . Hypertension   . Long-term (current) use of anticoagulants     For hx of LV apical aneurysm  . LVH (left ventricular hypertrophy)   . Atrial fibrillation     Unclear history  . Tobacco abuse   . Systolic heart failure     EF is 35% per cath 12/2011  . Myocardial infarction 1981    [name] 01/14/2012  . Left ventricular apical thrombus     hx/notes 10/06/2014  . On home oxygen therapy     "2.5L 24/7" (10/06/2014)  . COPD (chronic obstructive pulmonary disease)   . Pneumonia "several times"  . Arthritis     "shoulders; back" (10/06/2014)  . History of gout   . Chronic back pain     "anytime it's wet and cold" (10/06/2014)  . Nocturia   . Syncopal episodes     Past Surgical History  Procedure Laterality Date  .  Cholecystectomy    . Hernia repair    . Coronary angioplasty with stent placement  12/2011    LCX and L main - drug eluting  . Cardiac catheterization  1981    [name] 01/14/2012  . Left heart catheterization with coronary angiogram N/A 01/16/2012    Procedure: LEFT HEART CATHETERIZATION WITH CORONARY ANGIOGRAM;  Surgeon: Sherren Mocha, MD;  Location: [facility] CATH LAB;  Service: Cardiovascular;  Laterality: N/A;  . Percutaneous coronary stent intervention (pci-s) N/A 01/20/2012    Procedure: PERCUTANEOUS CORONARY STENT INTERVENTION (PCI-S);  Surgeon: Sherren Mocha, MD;  Location: [facility] CATH LAB;  Service: Cardiovascular;  Laterality: N/A;     Current Outpatient Prescriptions    Medication Sig Dispense Refill  . albuterol (PROVENTIL) (2.5 MG/3ML) 0.083% nebulizer solution Take 3 mLs (2.5 mg total) by nebulization every 6 (six) hours as needed for wheezing or shortness of breath. 75 mL 12  . allopurinol (ZYLOPRIM) 100 MG tablet Take 100 mg by mouth daily.     Marland Kitchen ALPRAZolam (XANAX) 0.25 MG tablet TAKE 1 TABLET BY MOUTH TWICE A DAY AS NEEDED FOR ANXIETY 120 tablet 1  . aspirin 81 MG tablet Take 81 mg by mouth daily.      . calcium carbonate 200 MG capsule Take 500 mg by mouth daily.      . clopidogrel (PLAVIX) 75 MG tablet Take 75 mg by mouth daily.    . CRESTOR 10 MG tablet TAKE 1 TABLET (10 MG TOTAL) BY MOUTH DAILY. 90 tablet 0  . [employer] D3 1000 UNITS capsule Take 1,000 Units by mouth daily.  5  . fish oil-omega-3 fatty acids 1000 MG capsule Take 2 g by mouth daily.      . Fluticasone-Salmeterol (ADVAIR DISKUS) 100-50 MCG/DOSE AEPB Inhale 1 puff into the lungs 2 (two) times daily. 60 each 5  . furosemide (LASIX) 40 MG tablet Take 2 tablets (80 mg total) by mouth 2 (two) times daily. 120 tablet 11  . guaiFENesin (MUCINEX) 600 MG 12 hr tablet Take 1,200 mg by mouth 2 (two) times daily as needed.      Marland Kitchen ipratropium (ATROVENT) 0.02 % nebulizer solution Take 4-6 times daily scheduled 75 mL 12  . irbesartan (AVAPRO) 75 MG tablet   10  . isosorbide mononitrate (IMDUR) 60 MG 24 hr tablet Take 1 tablet (60 mg total) by mouth daily. 90 tablet 1  . meclizine (ANTIVERT) 25 MG tablet TAKE 1 TABLET (25 MG TOTAL) BY MOUTH 3 (THREE) TIMES DAILY AS NEEDED FOR DIZZINESS. FOR DIZZINESS 30 tablet 3  . metoprolol (LOPRESSOR) 50 MG tablet TAKE 1 TABLET (50 MG TOTAL) BY MOUTH 2 (TWO) TIMES DAILY. 180 tablet 3  . nitroGLYCERIN (NITROSTAT) 0.4 MG SL tablet Place 1 tablet (0.4 mg total) under the tongue every 5 (five) minutes as needed. 25 tablet 3  . potassium chloride SA (K-DUR,KLOR-CON) 20 MEQ tablet Take 1 tablet (20 mEq total) by mouth 2 (two) times daily. 90 tablet 3  . PROAIR HFA 108 (90  BASE) MCG/ACT inhaler INHALE 2 PUFFS INTO LUNGS EVERY 6 HOURS AS NEEDED FOR WHEEZING 8.5 each 5  . ranolazine (RANEXA) 500 MG 12 hr tablet Take 500 mg by mouth 2 (two) times daily.    . sodium bicarbonate 650 MG tablet Take 650 mg by mouth daily.     . temazepam (RESTORIL) 30 MG capsule TAKE ONE CAPSULE BY MOUTH AT BEDTIME AS NEEDED 90 capsule 1  . tiotropium (SPIRIVA HANDIHALER) 18 MCG inhalation capsule PLACE 1 CAPSULE (  18 MCG TOTAL) INTO INHALER AND INHALE DAILY. 30 capsule 5  . traMADol (ULTRAM) 50 MG tablet Take 50 mg by mouth every 6 (six) hours as needed for moderate pain.      No current facility-administered medications for this visit.    Allergies:   Indomethacin; Lipitor; Pravachol; Procardia; and Zaroxolyn    Social History:  The patient  reports that he has been smoking Cigarettes.  He has a 48.75 pack-year smoking history. He has never used smokeless tobacco. He reports that he drinks alcohol. He reports that he does not use illicit drugs.   Family History:  The patient's family history includes Cerebral palsy in his daughter; Heart disease in his mother; Hypertension in his mother.    ROS:  Please see the history of present illness.   Otherwise, review of systems are positive for none.   All other systems are reviewed and negative.    PHYSICAL EXAM: VS:  BP 126/50 mmHg  Pulse 60  Ht 5\' 7"  (1.702 m)  Wt 180 lb 12.8 oz (82.01 kg)  BMI 28.31 kg/m2 , BMI Body mass index is 28.31 kg/(m^2). GEN: Well nourished, well developed, in no acute distress HEENT: normal Neck: no JVD, carotid bruits, or masses Cardiac: RRR; no murmurs, rubs, or gallops,no edema  Respiratory:  clear to auscultation bilaterally, normal work of breathing GI: soft, nontender, nondistended, + BS MS: no deformity or atrophy Skin: warm and dry, no rash Neuro:  Strength and sensation are intact Psych: euthymic mood, full affect   EKG:  EKG is ordered today. The ekg ordered today demonstrates normal  sinus rhythm at 60 bpm.  Occasional PVCs.  Old anteroseptal infarct.  Since last tracing of 10/21/14, no significant change   Recent Labs: 10/06/2014: Pro B Natriuretic peptide (BNP) 1522.0* 11/15/2014: ALT 13 03/21/2015: BUN 12; Creatinine 0.81; Hemoglobin 13.2; Platelets 174.0; Potassium 3.5; Sodium 144    Lipid Panel    Component Value Date/Time   CHOL 110 11/15/2014 1120   TRIG 50.0 11/15/2014 1120   HDL 44.50 11/15/2014 1120   CHOLHDL 2 11/15/2014 1120   VLDL 10.0 11/15/2014 1120   LDLCALC 56 11/15/2014 1120      Wt Readings from Last 3 Encounters:  03/21/15 180 lb 12.8 oz (82.01 kg)  02/16/15 179 lb (81.194 kg)  12/21/14 176 lb (79.833 kg)         ASSESSMENT AND PLAN:  1. Ischemic heart disease status post remote apical myocardial infarction and more recently status post drug-eluting stent placement in March 2013 to the circumflex and drug-eluting stent to the left main coronary artery. The patient is on long-term dual antiplatelet therapy. 2. COPD with ongoing tobacco abuse 3. Hypercholesterolemia 4. past history of gout, on allopurinol 5. Osteoarthritis 6. Dizziness and history of falls 7. Borderline low blood pressure  Plan: I urged him to stop smoking.  Continue current medication.  Recheck in 4 months for office visit and lipid panel hepatic function panel and basal metabolic panel Signed, Darlin Coco MD 03/21/2015 7:37 PM    [facility] [place], [place], [place]  [address] Phone: [phone]; Fax: [phone]

## 2015-03-22 NOTE — Progress Notes (Signed)
Quick Note:  Please report to patient. The recent labs are stable. Continue same medication and careful diet. ______ 

## 2015-03-24 ENCOUNTER — Other Ambulatory Visit: Payer: Self-pay | Admitting: Physician Assistant

## 2015-03-30 ENCOUNTER — Encounter: Payer: Self-pay | Admitting: Cardiology

## 2015-04-03 ENCOUNTER — Ambulatory Visit (INDEPENDENT_AMBULATORY_CARE_PROVIDER_SITE_OTHER): Payer: Medicare Other | Admitting: Internal Medicine

## 2015-04-03 ENCOUNTER — Encounter: Payer: Self-pay | Admitting: Internal Medicine

## 2015-04-03 VITALS — BP 138/82 | HR 55 | Ht 67.0 in | Wt 184.0 lb

## 2015-04-03 DIAGNOSIS — R5381 Other malaise: Secondary | ICD-10-CM | POA: Diagnosis not present

## 2015-04-03 DIAGNOSIS — J449 Chronic obstructive pulmonary disease, unspecified: Secondary | ICD-10-CM

## 2015-04-03 DIAGNOSIS — Z720 Tobacco use: Secondary | ICD-10-CM | POA: Diagnosis not present

## 2015-04-03 DIAGNOSIS — J9611 Chronic respiratory failure with hypoxia: Secondary | ICD-10-CM | POA: Insufficient documentation

## 2015-04-03 DIAGNOSIS — F17200 Nicotine dependence, unspecified, uncomplicated: Secondary | ICD-10-CM

## 2015-04-03 NOTE — Progress Notes (Signed)
Subjective:    Patient ID: Sean Figueroa, male    DOB: [birth year], 81 y.o.   MRN: [id number]  HPI   #Severe COPD a PFT 2015:  that showed FEV1 31%, ratio 42, no sign  bronchodilator response, FVC 52%, diffusing capacity was unable to be completed. +Air trapping .  He remains on Advair and Spiriva .   #SMoking He continues to smoke, advised on smoking cessation   #Chronic systolic CHF ef 95%  #Imaging CT May 2014 - no nodule or cancer   OV 02/16/2015  Chief Complaint  Patient presents with  . Follow-up    Pt stated his breathing has improved since last OV. Pt stated the advair is helping. Pt c/o prod cough with yellow mucus and chest tightness when coughing. Pt quesitoning if he should be on neb meds.    81 year old male with severe COPD. Presents for follow-up. Overall he reports stable disease. However he continues to smoke. He also reports significant exertional dyspnea and dizziness and physical deconditioning that is ongoing. He wants to be better from this even though he has improved from several months ago. He continues with the Spiriva and Advair diligently but is finding the cost of medications expensive. He wants to switch to nebulizer therapy. Medication list shows theophylline but due to cost is not taking this [he does not know that this medication is ineffective in COPD and has significant toxicity]. He continues to smoke and is reluctant to quit  OV 04/03/2015  Chief Complaint  Patient presents with  . Follow-up    Pt stated his breathing feels his breathing is doing well. Pt completed PT. Pt c/o prod cough with white mucus. Pt denies PC/tightness.    Follow-up  - Severe COPD with chronic respiratory failure: Last visit we changde his inhalers to nebulizers. He is using his nebulizers 3 times a day as opposed to 4 times a day as advised. Nevertheless he is had significant improvement with his dyspnea. He feels that it is helping and is better than mdi. COPD stable.  He is not in exacerbation. No new issues. I notice that he has never had alpha-1 antitrypsin genetic test. He has finished his home physical therapy program but is not interested in pulmonary rehabilitation  - Smoking: This continues to be a problem. He will not quit.  - Physical deconditioning: This is significantly improved after home physical therapy but he is not interested in pulmonary rehabilitation because of cost and logistic issues. His neighbor who is with him and checks on him  multiple times a day also  does not have the resources to take him to rehabilitation.   Review of Systems  Constitutional: Negative for fever and unexpected weight change.  HENT: Negative for congestion, dental problem, ear pain, nosebleeds, postnasal drip, rhinorrhea, sinus pressure, sneezing, sore throat and trouble swallowing.   Eyes: Negative for redness and itching.  Respiratory: Positive for cough and shortness of breath. Negative for chest tightness and wheezing.   Cardiovascular: Negative for palpitations and leg swelling.  Gastrointestinal: Negative for nausea and vomiting.  Genitourinary: Negative for dysuria.  Musculoskeletal: Negative for joint swelling.  Skin: Negative for rash.  Neurological: Negative for headaches.  Hematological: Does not bruise/bleed easily.  Psychiatric/Behavioral: Negative for dysphoric mood. The patient is not nervous/anxious.        Objective:   Physical Exam  Constitutional: He is oriented to person, place, and time. He appears well-developed and well-nourished. No distress.  HENT:  Head: Normocephalic  and atraumatic.  Right Ear: External ear normal.  Left Ear: External ear normal.  Mouth/Throat: Oropharynx is clear and moist. No oropharyngeal exudate.  Tobacco smell +  Eyes: Conjunctivae and EOM are normal. Pupils are equal, round, and reactive to light. Right eye exhibits no discharge. Left eye exhibits no discharge. No scleral icterus.  Neck: Normal range of  motion. Neck supple. No JVD present. No tracheal deviation present. No thyromegaly present.  Cardiovascular: Normal rate, regular rhythm and intact distal pulses.  Exam reveals no gallop and no friction rub.   No murmur heard. Pulmonary/Chest: Effort normal and breath sounds normal. No respiratory distress. He has no wheezes. He has no rales. He exhibits no tenderness.  barrell chest  Abdominal: Soft. Bowel sounds are normal. He exhibits no distension and no mass. There is no tenderness. There is no rebound and no guarding.  Musculoskeletal: Normal range of motion. He exhibits no edema or tenderness.  Looks a bit more conditioned  Lymphadenopathy:    He has no cervical adenopathy.  Neurological: He is alert and oriented to person, place, and time. He has normal reflexes. No cranial nerve deficit. Coordination normal.  Skin: Skin is warm and dry. No rash noted. He is not diaphoretic. No erythema. No pallor.  Psychiatric: He has a normal mood and affect. His behavior is normal. Judgment and thought content normal.  Nursing note and vitals reviewed.   Filed Vitals:   04/03/15 1419  BP: 138/82  Pulse: 55  Height: 5\' 7"  (1.702 m)  Weight: 184 lb (83.462 kg)  SpO2: 97%         Assessment & Plan:     ICD-9-CM ICD-10-CM   1. Chronic respiratory failure with hypoxia 518.83 J96.11 Alpha-1 antitrypsin phenotype   799.02    2. COPD, severe 496 J44.9 Alpha-1 antitrypsin phenotype  3. Smoking 305.1 Z72.0 Alpha-1 antitrypsin phenotype  4. Physical deconditioning 799.3 R53.81    - COPD with chronic respiratory failure: Stable disease. Advised to continue nebulizers. Advised increase nebulizer frequency. Continue oxygen. Check alpha 1 blood work  - Smoking: Advised to quit  - Physical deconditioning: This is improved. Home physical therapy is completed. He is not interested in pulmonary rehabilitation due to logistic and financial reasons. He is happy with his quality of life. Advised him to  be as active as possible  Follow-up  - 6 months with nurse partition Tammy Parrott or sooner if needed   Dr. Brand Males, M.D., [facility].C.P Pulmonary and Critical Care Medicine Staff Physician [facility] Pager: [phone], If no answer or between  15:00h - 7:00h: call [phone]  04/03/2015 2:40 PM

## 2015-04-03 NOTE — Patient Instructions (Addendum)
ICD-9-CM ICD-10-CM   1. Chronic respiratory failure with hypoxia 518.83 J96.11 Alpha-1 antitrypsin phenotype   799.02    2. COPD, severe 496 J44.9 Alpha-1 antitrypsin phenotype  3. Smoking 305.1 Z72.0 Alpha-1 antitrypsin phenotype  4. Physical deconditioning 799.3 R53.81     Stable COpd disease Improved physical conditioning Continue smoking  PLAN Check alpha 1 blood test genetic test for copd Continue  duoneb 4-6 times daily scheduled  + albtuerol neb as needed Be active to extent possible. Glad you finished PT. Respect desire to refuse rehab Quit smoking when possible  Followup  6 months with NP Tammy

## 2015-04-06 ENCOUNTER — Other Ambulatory Visit: Payer: Medicare Other

## 2015-04-06 DIAGNOSIS — J449 Chronic obstructive pulmonary disease, unspecified: Secondary | ICD-10-CM

## 2015-04-06 DIAGNOSIS — J9611 Chronic respiratory failure with hypoxia: Secondary | ICD-10-CM

## 2015-04-06 DIAGNOSIS — F17200 Nicotine dependence, unspecified, uncomplicated: Secondary | ICD-10-CM

## 2015-04-12 LAB — ALPHA-1 ANTITRYPSIN PHENOTYPE: A-1 Antitrypsin: 169 mg/dL (ref 83–199)

## 2015-04-14 NOTE — Progress Notes (Signed)
Quick Note:  Called and spoke to pt. Informed pt of the results per MR. Pt verbalized understanding and denied any further questions or concerns at this time. ______ 

## 2015-04-14 NOTE — Telephone Encounter (Signed)
Oxygen issue resolved

## 2015-04-17 ENCOUNTER — Other Ambulatory Visit: Payer: Self-pay | Admitting: Cardiology

## 2015-04-28 ENCOUNTER — Other Ambulatory Visit: Payer: Self-pay | Admitting: Cardiology

## 2015-05-21 ENCOUNTER — Other Ambulatory Visit: Payer: Self-pay | Admitting: Cardiology

## 2015-05-31 ENCOUNTER — Telehealth: Payer: Self-pay | Admitting: Internal Medicine

## 2015-05-31 NOTE — Telephone Encounter (Signed)
I called spoke with pt. He wanted to ensure it was okay for him to have a humidifier. i advised this was okay. Nothing further needed

## 2015-06-09 ENCOUNTER — Other Ambulatory Visit: Payer: Self-pay | Admitting: Cardiology

## 2015-06-09 DIAGNOSIS — G4700 Insomnia, unspecified: Secondary | ICD-10-CM

## 2015-06-14 NOTE — Telephone Encounter (Signed)
Hi Melinda, I cannot refill this medication. Can you refill please?

## 2015-06-14 NOTE — Telephone Encounter (Signed)
Follow up:   Pt called and would like a refill on Temazapam 30mg  called in.

## 2015-07-11 ENCOUNTER — Other Ambulatory Visit: Payer: Self-pay | Admitting: *Deleted

## 2015-07-11 DIAGNOSIS — I119 Hypertensive heart disease without heart failure: Secondary | ICD-10-CM

## 2015-07-11 MED ORDER — FUROSEMIDE 40 MG PO TABS: 80.0000 mg | ORAL_TABLET | Freq: Two times a day (BID) | ORAL | Status: DC

## 2015-07-19 ENCOUNTER — Other Ambulatory Visit: Payer: Self-pay | Admitting: Cardiology

## 2015-07-19 DIAGNOSIS — F419 Anxiety disorder, unspecified: Secondary | ICD-10-CM

## 2015-07-19 NOTE — Telephone Encounter (Signed)
Okay to refill xanax. 

## 2015-07-21 ENCOUNTER — Telehealth: Payer: Self-pay | Admitting: Cardiology

## 2015-07-21 ENCOUNTER — Telehealth: Payer: Self-pay | Admitting: Internal Medicine

## 2015-07-21 DIAGNOSIS — J449 Chronic obstructive pulmonary disease, unspecified: Secondary | ICD-10-CM

## 2015-07-21 NOTE — Telephone Encounter (Signed)
Patient says that he is having problems with SOB, sweaty and dizzy at night.  Mostly happens if he is moving around. Dr. Mare Ferrari is checking his heart on 9/9.  He said that Dr. Mare Ferrari recommended patient call Dr. Chase Caller because he thinks it may be lung related.  No cough. No tightness in chest. Patient scheduled to see Dr. Chase Caller on 08/11/15 at 3pm.    Patient wants to know if he should increase his oxygen at night?  He is currently on 3L continuous.  MR - Please advise.

## 2015-07-21 NOTE — Telephone Encounter (Signed)
New Message    Pt c/o Shortness Of Breath: STAT if SOB developed within the last 24 hours or pt is noticeably SOB on the phone  1. Are you currently SOB (can you hear that pt is SOB on the phone)? no  2. How long have you been experiencing SOB? Last night  3. Are you SOB when sitting or when up moving around? Laying down  4. Are you currently experiencing any other symptoms? sweaty

## 2015-07-21 NOTE — Telephone Encounter (Signed)
Spoke with patient regarding shortness of breath  He states that this has been going on for a while and happens at night Patient denies any chest pain  Scheduled to see  Dr. Mare Ferrari next week  Advised to keep appointment but to call and get an appointment with his pulmonologist as well  Patient verbalized understanding.

## 2015-07-21 NOTE — Telephone Encounter (Signed)
Patient notified and given recommendations for ER Order entered for ONO on 3L Nothing further needed.

## 2015-07-21 NOTE — Telephone Encounter (Signed)
Cannot make any rec without seeing him. We should overnightpulse ox on 3L at night before making other recs. So please order. IF problems worsening/concerning at any time at all go to ER instead of waiting for fu with me

## 2015-07-21 NOTE — Telephone Encounter (Signed)
agree

## 2015-07-25 ENCOUNTER — Encounter: Payer: Self-pay | Admitting: *Deleted

## 2015-07-26 ENCOUNTER — Encounter: Payer: Self-pay | Admitting: Cardiology

## 2015-07-26 ENCOUNTER — Ambulatory Visit (INDEPENDENT_AMBULATORY_CARE_PROVIDER_SITE_OTHER): Payer: Medicare Other | Admitting: Cardiology

## 2015-07-26 ENCOUNTER — Other Ambulatory Visit: Payer: Medicare Other

## 2015-07-26 VITALS — BP 120/70 | HR 58 | Ht 67.0 in | Wt 171.4 lb

## 2015-07-26 DIAGNOSIS — I5023 Acute on chronic systolic (congestive) heart failure: Secondary | ICD-10-CM | POA: Diagnosis not present

## 2015-07-26 DIAGNOSIS — R001 Bradycardia, unspecified: Secondary | ICD-10-CM

## 2015-07-26 DIAGNOSIS — R42 Dizziness and giddiness: Secondary | ICD-10-CM

## 2015-07-26 DIAGNOSIS — I480 Paroxysmal atrial fibrillation: Secondary | ICD-10-CM

## 2015-07-26 MED ORDER — METOPROLOL TARTRATE 25 MG PO TABS: 25.0000 mg | ORAL_TABLET | Freq: Two times a day (BID) | ORAL | Status: DC

## 2015-07-26 NOTE — Progress Notes (Signed)
Cardiology Office Note   Date:  07/26/2015   ID:  Sean Figueroa, DOB Jul 05, 1933, MRN [id number]  PCP:  [name], MD  Cardiologist: Darlin Coco MD  No chief complaint on file.     History of Present Illness: Sean Figueroa is a 81 y.o. male who presents for follow-up visit  This pleasant 81 year old gentleman is seen for a scheduled followup office visit. He has ischemic heart disease. He had a history of a remote apical myocardial infarction. He developed crescendo angina in March 2013. He underwent high risk drug-eluting stent placement to the circumflex and a drug-eluting stent to the left main with support of the impellla device by Dr. Burt Knack. He is now on long-term dual antiplatelet therapy with aspirin and Plavix. Since last visit he has been doing well from the cardiac standpoint. He has not had to take nitroglycerin more than once or twice since he had his stents. He has had no chest pain recently. He continues to smoke and he is trying to quit. The patient presented to [facility] on 10/06/14 with dyspnea and right leg pain. He was found to be in acute heart failure and there was concern for right lower extremity cellulitis.  He was diuresed with IV Lasix and lost 5 pounds during his hospital stay. As his volume status improved, his respiratory status improved. His renal function and electrolytes were followed closely and his potassium was supplemented as needed.  His right leg improved on antibiotics and he was instructed to complete antibiotic course as an outpatient.  Due to recent history of falls at home and acute heart failure exacerbation, a HHRN, PT/OT were ordered at time of discharge. He was also sent home on home O2 as well. He returns today with a neighbor who will become his healthcare power of attorney soon. The patient's only living relative is a daughter who is in a nursing home and is in very poor health. Since last visit the patient has been doing reasonably  well. Unfortunately he still smokes occasionally. He has had a cough for the past month. He has received 2 different antibiotics from his PCP. He has not been taking any Mucinex recently. He has had marked sinus bradycardia.  He complains of lightheadedness at times.  We will reduce his beta blocker because of his bradycardia.  Past Medical History  Diagnosis Date  . 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 Has chronic angina; S/P DES to the LCX and DES to the L MAIN 12/2011. Now on Plavix and ASA.   Marland Kitchen Hyperlipidemia   . Hypertension   . Long-term (current) use of anticoagulants     For hx of LV apical aneurysm  . LVH (left ventricular hypertrophy)   . Atrial fibrillation     Unclear history  . Tobacco abuse   . Systolic heart failure     EF is 35% per cath 12/2011  . Myocardial infarction 1981    [name] 01/14/2012  . Left ventricular apical thrombus     hx/notes 10/06/2014  . On home oxygen therapy     "2.5L 24/7" (10/06/2014)  . COPD (chronic obstructive pulmonary disease)   . Pneumonia "several times"  . Arthritis     "shoulders; back" (10/06/2014)  . History of gout   . Chronic back pain     "anytime it's wet and cold" (10/06/2014)  .  Nocturia   . Syncopal episodes     Past Surgical History  Procedure Laterality Date  . Cholecystectomy    . Hernia repair    . Coronary angioplasty with stent placement  12/2011    LCX and L main - drug eluting  . Cardiac catheterization  1981    [name] 01/14/2012  . Left heart catheterization with coronary angiogram N/A 01/16/2012    Procedure: LEFT HEART CATHETERIZATION WITH CORONARY ANGIOGRAM;  Surgeon: Sherren Mocha, MD;  Location: [facility] CATH LAB;  Service: Cardiovascular;  Laterality: N/A;  . Percutaneous coronary stent intervention (pci-s) N/A 01/20/2012    Procedure: PERCUTANEOUS CORONARY STENT INTERVENTION (PCI-S);  Surgeon:  Sherren Mocha, MD;  Location: [facility] CATH LAB;  Service: Cardiovascular;  Laterality: N/A;     Current Outpatient Prescriptions  Medication Sig Dispense Refill  . albuterol (PROVENTIL) (2.5 MG/3ML) 0.083% nebulizer solution Take 3 mLs (2.5 mg total) by nebulization every 6 (six) hours as needed for wheezing or shortness of breath. 75 mL 12  . allopurinol (ZYLOPRIM) 100 MG tablet Take 100 mg by mouth daily.     Marland Kitchen ALPRAZolam (XANAX) 0.25 MG tablet TAKE 1 TABLET BY MOUTH TWICE A DAY AS NEEDED FOR ANXIETY 120 tablet 2  . aspirin 81 MG tablet Take 81 mg by mouth daily.      . calcium carbonate 200 MG capsule Take 500 mg by mouth daily.      . clopidogrel (PLAVIX) 75 MG tablet Take 75 mg by mouth daily.    . CRESTOR 10 MG tablet TAKE 1 TABLET (10 MG TOTAL) BY MOUTH DAILY. 90 tablet 0  . [facility] D3 1000 UNITS capsule Take 1,000 Units by mouth daily.  5  . fish oil-omega-3 fatty acids 1000 MG capsule Take 2 g by mouth daily.      . Fluticasone-Salmeterol (ADVAIR DISKUS) 100-50 MCG/DOSE AEPB Inhale 1 puff into the lungs 2 (two) times daily. 60 each 5  . furosemide (LASIX) 40 MG tablet Take 2 tablets (80 mg total) by mouth 2 (two) times daily. 360 tablet 0  . guaiFENesin (MUCINEX) 600 MG 12 hr tablet Take 1,200 mg by mouth 2 (two) times daily as needed.      Marland Kitchen ipratropium (ATROVENT) 0.02 % nebulizer solution Take 4-6 times daily scheduled 75 mL 12  . irbesartan (AVAPRO) 75 MG tablet   10  . isosorbide mononitrate (IMDUR) 60 MG 24 hr tablet TAKE 1 TABLET (60 MG TOTAL) BY MOUTH 2 (TWO) TIMES DAILY. 180 tablet 1  . KLOR-CON M20 20 MEQ tablet TAKE 1 TABLET BY MOUTH TWICE A DAY 180 tablet 1  . meclizine (ANTIVERT) 25 MG tablet TAKE 1 TABLET (25 MG TOTAL) BY MOUTH 3 (THREE) TIMES DAILY AS NEEDED FOR DIZZINESS. FOR DIZZINESS 30 tablet 3  . metoprolol (LOPRESSOR) 25 MG tablet Take 1 tablet (25 mg total) by mouth 2 (two) times daily. 60 tablet 5  . NITROSTAT 0.4 MG SL tablet PLACE 1 TABLET (0.4 MG TOTAL) UNDER THE  TONGUE EVERY 5 (FIVE) MINUTES AS NEEDED. 25 tablet 6  . PROAIR HFA 108 (90 BASE) MCG/ACT inhaler INHALE 2 PUFFS INTO LUNGS EVERY 6 HOURS AS NEEDED FOR WHEEZING 8.5 each 5  . ranolazine (RANEXA) 500 MG 12 hr tablet Take 500 mg by mouth 2 (two) times daily.    . sodium bicarbonate 650 MG tablet Take 650 mg by mouth daily.     . temazepam (RESTORIL) 30 MG capsule TAKE ONE CAPSULE BY MOUTH AT BEDTIME AS NEEDED 90  capsule 1  . tiotropium (SPIRIVA HANDIHALER) 18 MCG inhalation capsule PLACE 1 CAPSULE (18 MCG TOTAL) INTO INHALER AND INHALE DAILY. 30 capsule 5  . traMADol (ULTRAM) 50 MG tablet Take 50 mg by mouth every 6 (six) hours as needed for moderate pain.      No current facility-administered medications for this visit.    Allergies:   Indomethacin; Lipitor; Pravachol; Procardia; and Zaroxolyn    Social History:  The patient  reports that he has been smoking Cigarettes.  He has a 48.75 pack-year smoking history. He has never used smokeless tobacco. He reports that he drinks alcohol. He reports that he does not use illicit drugs.   Family History:  The patient's family history includes Cerebral palsy in his daughter; Heart disease in his mother; Hypertension in his mother.    ROS:  Please see the history of present illness.   Otherwise, review of systems are positive for none.   All other systems are reviewed and negative.    PHYSICAL EXAM: VS:  BP 120/70 mmHg  Pulse 58  Ht 5\' 7"  (1.702 m)  Wt 171 lb 6.4 oz (77.747 kg)  BMI 26.84 kg/m2 , BMI Body mass index is 26.84 kg/(m^2). GEN: Well nourished, well developed, in no acute distress HEENT: normal Neck: no JVD, carotid bruits, or masses Cardiac: RRR; no murmurs, rubs, or gallops,no edema  Respiratory:  Scattered rhonchi. GI: soft, nontender, nondistended, + BS MS: no deformity or atrophy Skin: warm and dry, no rash Neuro:  Strength and sensation are intact Psych: euthymic mood, full affect   EKG:  EKG is ordered today. The ekg  ordered today demonstrates sinus bradycardia 57/m.  Old anterior wall myocardial infarction.  Occasional PVCs.   Recent Labs: 10/06/2014: Pro B Natriuretic peptide (BNP) 1522.0* 11/15/2014: ALT 13 03/21/2015: BUN 12; Creatinine, Ser 0.81; Hemoglobin 13.2; Platelets 174.0; Potassium 3.5; Sodium 144    Lipid Panel    Component Value Date/Time   CHOL 110 11/15/2014 1120   TRIG 50.0 11/15/2014 1120   HDL 44.50 11/15/2014 1120   CHOLHDL 2 11/15/2014 1120   VLDL 10.0 11/15/2014 1120   LDLCALC 56 11/15/2014 1120      Wt Readings from Last 3 Encounters:  07/26/15 171 lb 6.4 oz (77.747 kg)  04/03/15 184 lb (83.462 kg)  03/21/15 180 lb 12.8 oz (82.01 kg)        ASSESSMENT AND PLAN:  1. Ischemic heart disease status post remote apical myocardial infarction and more recently status post drug-eluting stent placement in March 2013 to the circumflex and drug-eluting stent to the left main coronary artery. The patient is on long-term dual antiplatelet therapy. 2. COPD with ongoing tobacco abuse 3. Hypercholesterolemia 4. past history of gout, on allopurinol 5. Osteoarthritis 6. Dizziness and history of falls 7. Borderline low blood pressure  Plan: I urged him to stop smoking. Continue current medication. Recheck in 4 months for office visit and lipid panel hepatic function panel and basal metabolic panel   Current medicines are reviewed at length with the patient today.  The patient does not have concerns regarding medicines.  The following changes have been made:  Reduce metoprolol tartrate down to 25 mg twice a day  Labs/ tests ordered today include:   Orders Placed This Encounter  Procedures  . EKG 12-Lead     Disposition: Reduce metoprolol.  Recheck in 4 months for office visit and EKG.  His recent labs from 06/28/15 were reviewed and were satisfactory  Signed, Darlin Coco  MD 07/26/2015 7:07 PM    [address], [place],  [place]  [address] Phone: [phone]; Fax: [phone]

## 2015-07-26 NOTE — Patient Instructions (Addendum)
Medication Instructions:  DECREASE YOU METOPROLOL TO 25 MG TWICE A DAY, NEW RX SENT TO THE PHARMACY  OK TO TAKE 1/2 OF THE 50 MG TABLETS UNTIL THEY ARE ALL USED UP  Labwork: NONE  Testing/Procedures: NONE  Follow-Up: Your physician wants you to follow-up in: [place] will receive a reminder letter in the mail two months in advance. If you don't receive a letter, please call our office to schedule the follow-up appointment.   CALL THE OFFICE AND LET us KNOW HOW AND IF YOU ARE TAKING THIS MEDICATION

## 2015-07-31 ENCOUNTER — Telehealth: Payer: Self-pay | Admitting: Cardiology

## 2015-07-31 NOTE — Telephone Encounter (Signed)
Will forward to  Dr. Brackbill so he will be aware 

## 2015-07-31 NOTE — Telephone Encounter (Signed)
Okay; thanks.

## 2015-07-31 NOTE — Telephone Encounter (Signed)
New message    Patient calling     Pt c/o medication issue:  1. Name of Medication: irbesartan  75 mg   2. How are you currently taking this medication (dosage and times per day)? Once a day   3. Are you having a reaction (difficulty breathing--STAT)? No   4. What is your medication issue? Was told to call back  - Patient states he taken this medication.

## 2015-08-10 ENCOUNTER — Other Ambulatory Visit: Payer: Self-pay | Admitting: Cardiology

## 2015-08-11 ENCOUNTER — Ambulatory Visit (INDEPENDENT_AMBULATORY_CARE_PROVIDER_SITE_OTHER): Payer: Medicare Other | Admitting: Internal Medicine

## 2015-08-11 ENCOUNTER — Encounter: Payer: Self-pay | Admitting: Internal Medicine

## 2015-08-11 ENCOUNTER — Other Ambulatory Visit: Payer: Self-pay | Admitting: Cardiology

## 2015-08-11 VITALS — BP 132/58 | HR 67 | Ht 67.0 in | Wt 175.0 lb

## 2015-08-11 DIAGNOSIS — Z720 Tobacco use: Secondary | ICD-10-CM | POA: Diagnosis not present

## 2015-08-11 DIAGNOSIS — Z23 Encounter for immunization: Secondary | ICD-10-CM | POA: Diagnosis not present

## 2015-08-11 DIAGNOSIS — J449 Chronic obstructive pulmonary disease, unspecified: Secondary | ICD-10-CM

## 2015-08-11 DIAGNOSIS — F17200 Nicotine dependence, unspecified, uncomplicated: Secondary | ICD-10-CM

## 2015-08-11 DIAGNOSIS — J9611 Chronic respiratory failure with hypoxia: Secondary | ICD-10-CM | POA: Diagnosis not present

## 2015-08-11 NOTE — Progress Notes (Signed)
Subjective:    Patient ID: Sean Figueroa, male    DOB: Apr 05, 1933, 81 y.o.   MRN: [id number]  HPI  HPI   #Severe COPD - MM a PFT 2015:  that showed FEV1 31%, ratio 42, no sign  bronchodilator response, FVC 52%, diffusing capacity was unable to be completed. +Air trapping . On duoneb due to cost   #SMoking He continues to smoke,   #Chronic systolic CHF ef 31%  #Imaging CT May 2014 - no nodule or cancer   OV 08/11/2015  Chief Complaint  Patient presents with  . Acute Visit    Pt states the dizziness and SOB was d/t the BP med. Pt states the metoprolol was reduce to 25mg  BID and pts states he feels better now. Pt states his SOB is at baseline, mild cough - at baseline. Pt denies CP/tightness.    Follow-up severe COPD with chronic respiratory failure. Last visit was in May 2016. At which time we changed to Spiriva and Advair to DuoNeb due to cost issues. He now presents for follow-up as usual with his friend. He is a poor historian partly due to hard of hearing issues and age. He reports that the nebulizers are working well for him. Initially stated with my nurse that he is taking Spiriva and Advair but he confirmed to me that he is only on nebulizers. He continues to smoke. Earlier this month he had some dizziness and diaphoresis but it appears that his cardiologist based on chart review has cut down his Lopressor dose and he is feeling better as a result. He is not interested in quitting smoking. He has never had a flu shot or pneumonia vaccine and he is open to having it both today    Immunization History  Administered Date(s) Administered  . Influenza Split 08/29/2014      Current outpatient prescriptions:  .  albuterol (PROVENTIL) (2.5 MG/3ML) 0.083% nebulizer solution, Take 3 mLs (2.5 mg total) by nebulization every 6 (six) hours as needed for wheezing or shortness of breath., Disp: 75 mL, Rfl: 12 .  allopurinol (ZYLOPRIM) 100 MG tablet, Take 100 mg by mouth daily. , Disp:  , Rfl:  .  ALPRAZolam (XANAX) 0.25 MG tablet, TAKE 1 TABLET BY MOUTH TWICE A DAY AS NEEDED FOR ANXIETY, Disp: 120 tablet, Rfl: 2 .  aspirin 81 MG tablet, Take 81 mg by mouth daily.  , Disp: , Rfl:  .  calcium carbonate 200 MG capsule, Take 500 mg by mouth daily.  , Disp: , Rfl:  .  clopidogrel (PLAVIX) 75 MG tablet, Take 1 tablet (75 mg total) by mouth daily. With breakfast, Disp: 90 tablet, Rfl: 0 .  CRESTOR 10 MG tablet, TAKE 1 TABLET (10 MG TOTAL) BY MOUTH DAILY., Disp: 90 tablet, Rfl: 0 .  [facility]S D3 1000 UNITS capsule, Take 1,000 Units by mouth daily., Disp: , Rfl: 5 .  fish oil-omega-3 fatty acids 1000 MG capsule, Take 2 g by mouth daily.  , Disp: , Rfl:  .  furosemide (LASIX) 40 MG tablet, Take 2 tablets (80 mg total) by mouth 2 (two) times daily., Disp: 360 tablet, Rfl: 0 .  guaiFENesin (MUCINEX) 600 MG 12 hr tablet, Take 1,200 mg by mouth 2 (two) times daily as needed.  , Disp: , Rfl:  .  ipratropium (ATROVENT) 0.02 % nebulizer solution, Take 4-6 times daily scheduled, Disp: 75 mL, Rfl: 12 .  irbesartan (AVAPRO) 75 MG tablet, , Disp: , Rfl: 10 .  isosorbide  mononitrate (IMDUR) 60 MG 24 hr tablet, TAKE 1 TABLET (60 MG TOTAL) BY MOUTH 2 (TWO) TIMES DAILY., Disp: 180 tablet, Rfl: 1 .  KLOR-CON M20 20 MEQ tablet, TAKE 1 TABLET BY MOUTH TWICE A DAY, Disp: 180 tablet, Rfl: 1 .  meclizine (ANTIVERT) 25 MG tablet, TAKE 1 TABLET (25 MG TOTAL) BY MOUTH 3 (THREE) TIMES DAILY AS NEEDED FOR DIZZINESS. FOR DIZZINESS, Disp: 30 tablet, Rfl: 3 .  metoprolol (LOPRESSOR) 25 MG tablet, Take 1 tablet (25 mg total) by mouth 2 (two) times daily., Disp: 60 tablet, Rfl: 5 .  NITROSTAT 0.4 MG SL tablet, PLACE 1 TABLET (0.4 MG TOTAL) UNDER THE TONGUE EVERY 5 (FIVE) MINUTES AS NEEDED., Disp: 25 tablet, Rfl: 6 .  PROAIR HFA 108 (90 BASE) MCG/ACT inhaler, INHALE 2 PUFFS INTO LUNGS EVERY 6 HOURS AS NEEDED FOR WHEEZING, Disp: 8.5 each, Rfl: 5 .  sodium bicarbonate 650 MG tablet, Take 650 mg by mouth daily. , Disp: , Rfl:  .   temazepam (RESTORIL) 30 MG capsule, TAKE ONE CAPSULE BY MOUTH AT BEDTIME AS NEEDED, Disp: 90 capsule, Rfl: 1 .  traMADol (ULTRAM) 50 MG tablet, Take 50 mg by mouth every 6 (six) hours as needed for moderate pain. , Disp: , Rfl:  .  RANEXA 500 MG 12 hr tablet, TAKE 1 TABLET TWICE DAILY (Patient not taking: Reported on 08/11/2015), Disp: 180 tablet, Rfl: 0    Review of Systems  Constitutional: Negative for fever and unexpected weight change.  HENT: Negative for congestion, dental problem, ear pain, nosebleeds, postnasal drip, rhinorrhea, sinus pressure, sneezing, sore throat and trouble swallowing.   Eyes: Negative for redness and itching.  Respiratory: Positive for cough and shortness of breath. Negative for chest tightness and wheezing.   Cardiovascular: Negative for palpitations and leg swelling.  Gastrointestinal: Negative for nausea and vomiting.  Genitourinary: Negative for dysuria.  Musculoskeletal: Negative for joint swelling.  Skin: Negative for rash.  Neurological: Negative for headaches.  Hematological: Does not bruise/bleed easily.  Psychiatric/Behavioral: Negative for dysphoric mood. The patient is not nervous/anxious.        Objective:   Physical Exam  Constitutional: He is oriented to person, place, and time. He appears well-developed and well-nourished. No distress.  Body mass index is 27.4 kg/(m^2). Sitting on wheel chair HOH   HENT:  Head: Normocephalic and atraumatic.  Right Ear: External ear normal.  Left Ear: External ear normal.  Mouth/Throat: Oropharynx is clear and moist. No oropharyngeal exudate.  Eyes: Conjunctivae and EOM are normal. Pupils are equal, round, and reactive to light. Right eye exhibits no discharge. Left eye exhibits no discharge. No scleral icterus.  Neck: Normal range of motion. Neck supple. No JVD present. No tracheal deviation present. No thyromegaly present.  Cardiovascular: Normal rate, regular rhythm and intact distal pulses.  Exam  reveals no gallop and no friction rub.   No murmur heard. Pulmonary/Chest: Effort normal and breath sounds normal. No respiratory distress. He has no wheezes. He has no rales. He exhibits no tenderness.  barell chest  Abdominal: Soft. Bowel sounds are normal. He exhibits no distension and no mass. There is no tenderness. There is no rebound and no guarding.  Musculoskeletal: Normal range of motion. He exhibits no edema or tenderness.  Lymphadenopathy:    He has no cervical adenopathy.  Neurological: He is alert and oriented to person, place, and time. He has normal reflexes. No cranial nerve deficit. Coordination normal.  Skin: Skin is warm and dry. No rash noted.  He is not diaphoretic. No erythema. No pallor.  Psychiatric: He has a normal mood and affect. His behavior is normal. Judgment and thought content normal.  Nursing note and vitals reviewed.   Filed Vitals:   08/11/15 1452  BP: 132/58  Pulse: 67  Height: 5\' 7"  (1.702 m)  Weight: 175 lb (79.379 kg)  SpO2: 98%         Assessment & Plan:     ICD-9-CM ICD-10-CM   1. Chronic respiratory failure with hypoxia 518.83 J96.11    799.02    2. COPD, severe 496 J44.9   3. Smoking 305.1 Z72.0    Stable COpd disease with physical conditioning and smoking  PLAN Flu shot 08/11/2015 Prevnar 08/11/2015 Continue  duoneb 4-6 times daily scheduled  + albtuerol neb as needed Be active to extent possible.  Quit smoking when possible  Followup  6 months with NP Tammy    Dr. Brand Males, M.D., [facility].C.P Pulmonary and Critical Care Medicine Staff Physician [facility] Pager: [phone], If no answer or between  15:00h - 7:00h: call [phone]  08/11/2015 3:10 PM

## 2015-08-11 NOTE — Patient Instructions (Signed)
ICD-9-CM ICD-10-CM   1. Chronic respiratory failure with hypoxia 518.83 J96.11    799.02    2. COPD, severe 496 J44.9   3. Smoking 305.1 Z72.0     Stable COpd disease with physical conditioning and smoking  PLAN Flu shot 08/11/2015 Prevnar 08/11/2015 Continue  duoneb 4-6 times daily scheduled  + albtuerol neb as needed Be active to extent possible.  Quit smoking when possible  Followup  6 months with NP Tammy

## 2015-08-13 ENCOUNTER — Other Ambulatory Visit: Payer: Self-pay | Admitting: Internal Medicine

## 2015-08-14 DIAGNOSIS — Z23 Encounter for immunization: Secondary | ICD-10-CM | POA: Diagnosis not present

## 2015-08-17 NOTE — Addendum Note (Signed)
Addended by: Maurice March on: 08/17/2015 12:10 PM   Modules accepted: Orders

## 2015-08-20 ENCOUNTER — Other Ambulatory Visit: Payer: Self-pay | Admitting: Cardiology

## 2015-08-21 ENCOUNTER — Telehealth: Payer: Self-pay | Admitting: Cardiology

## 2015-08-21 NOTE — Telephone Encounter (Signed)
Pt c/o medication issue: 1. Name of Medication: metoprolol 25 mg   4. What is your medication issue? Pt would like a call back to determine if it is the reason why he is having dizzy spells. Please call back to discuss.

## 2015-08-21 NOTE — Telephone Encounter (Signed)
Agree with assessment. 

## 2015-08-21 NOTE — Telephone Encounter (Signed)
Spoke with patient and he had been having dizzy spells  He did figure out that he had been taking his Metoprolol 25 mg tablets along with his 50 mg tablets  Patient stated he realized and changed to the Metoprolol 25 mg only. Realized yesterday  No vital signs obtained or way to check at home Patient feeling better Advised to call back if any problems

## 2015-08-25 ENCOUNTER — Telehealth: Payer: Self-pay

## 2015-08-25 NOTE — Telephone Encounter (Signed)
[facility]oke with [facility] and Rx only $34.18, advised patient

## 2015-08-25 NOTE — Telephone Encounter (Signed)
Patient called stating that his insurance, [employer] will not pay for his Temazepam and it will be $145.00.  He is asking if there is something that can be prescribed that will be less expensive or that the insurance will pay for.

## 2015-08-25 NOTE — Telephone Encounter (Signed)
Thanks for helping him.

## 2015-08-25 NOTE — Telephone Encounter (Signed)
Left message to call back  

## 2015-08-28 ENCOUNTER — Other Ambulatory Visit: Payer: Self-pay | Admitting: Physician Assistant

## 2015-09-04 ENCOUNTER — Other Ambulatory Visit: Payer: Self-pay | Admitting: Cardiology

## 2015-09-05 NOTE — Telephone Encounter (Signed)
Okay to refill meclizine 

## 2015-09-16 ENCOUNTER — Other Ambulatory Visit: Payer: Self-pay | Admitting: Cardiology

## 2015-09-21 ENCOUNTER — Encounter (HOSPITAL_COMMUNITY): Payer: Self-pay | Admitting: Emergency Medicine

## 2015-09-21 ENCOUNTER — Emergency Department (HOSPITAL_COMMUNITY): Payer: Medicare Other

## 2015-09-21 ENCOUNTER — Inpatient Hospital Stay (HOSPITAL_COMMUNITY)
Admission: EM | Admit: 2015-09-21 | Discharge: 2015-09-24 | DRG: 190 | Disposition: A | Discharge status: Home / Self Care | Payer: Medicare Other | Attending: Internal Medicine | Admitting: Internal Medicine

## 2015-09-21 DIAGNOSIS — F17210 Nicotine dependence, cigarettes, uncomplicated: Secondary | ICD-10-CM | POA: Diagnosis present

## 2015-09-21 DIAGNOSIS — Z7902 Long term (current) use of antithrombotics/antiplatelets: Secondary | ICD-10-CM | POA: Diagnosis not present

## 2015-09-21 DIAGNOSIS — I110 Hypertensive heart disease with heart failure: Secondary | ICD-10-CM | POA: Diagnosis present

## 2015-09-21 DIAGNOSIS — J9602 Acute respiratory failure with hypercapnia: Secondary | ICD-10-CM | POA: Diagnosis not present

## 2015-09-21 DIAGNOSIS — I2510 Atherosclerotic heart disease of native coronary artery without angina pectoris: Secondary | ICD-10-CM | POA: Diagnosis present

## 2015-09-21 DIAGNOSIS — J9622 Acute and chronic respiratory failure with hypercapnia: Secondary | ICD-10-CM | POA: Diagnosis present

## 2015-09-21 DIAGNOSIS — I480 Paroxysmal atrial fibrillation: Secondary | ICD-10-CM | POA: Diagnosis present

## 2015-09-21 DIAGNOSIS — J9621 Acute and chronic respiratory failure with hypoxia: Secondary | ICD-10-CM

## 2015-09-21 DIAGNOSIS — T380X5A Adverse effect of glucocorticoids and synthetic analogues, initial encounter: Secondary | ICD-10-CM | POA: Diagnosis present

## 2015-09-21 DIAGNOSIS — J441 Chronic obstructive pulmonary disease with (acute) exacerbation: Secondary | ICD-10-CM | POA: Diagnosis present

## 2015-09-21 DIAGNOSIS — J9692 Respiratory failure, unspecified with hypercapnia: Secondary | ICD-10-CM | POA: Diagnosis present

## 2015-09-21 DIAGNOSIS — J438 Other emphysema: Secondary | ICD-10-CM | POA: Diagnosis not present

## 2015-09-21 DIAGNOSIS — R0602 Shortness of breath: Secondary | ICD-10-CM | POA: Diagnosis not present

## 2015-09-21 DIAGNOSIS — J449 Chronic obstructive pulmonary disease, unspecified: Secondary | ICD-10-CM

## 2015-09-21 DIAGNOSIS — M549 Dorsalgia, unspecified: Secondary | ICD-10-CM | POA: Diagnosis present

## 2015-09-21 DIAGNOSIS — E785 Hyperlipidemia, unspecified: Secondary | ICD-10-CM | POA: Diagnosis present

## 2015-09-21 DIAGNOSIS — I5022 Chronic systolic (congestive) heart failure: Secondary | ICD-10-CM | POA: Diagnosis present

## 2015-09-21 DIAGNOSIS — M109 Gout, unspecified: Secondary | ICD-10-CM | POA: Diagnosis present

## 2015-09-21 DIAGNOSIS — I252 Old myocardial infarction: Secondary | ICD-10-CM | POA: Diagnosis not present

## 2015-09-21 DIAGNOSIS — Z9981 Dependence on supplemental oxygen: Secondary | ICD-10-CM | POA: Diagnosis not present

## 2015-09-21 DIAGNOSIS — G8929 Other chronic pain: Secondary | ICD-10-CM | POA: Diagnosis present

## 2015-09-21 DIAGNOSIS — M1990 Unspecified osteoarthritis, unspecified site: Secondary | ICD-10-CM | POA: Diagnosis present

## 2015-09-21 DIAGNOSIS — K219 Gastro-esophageal reflux disease without esophagitis: Secondary | ICD-10-CM | POA: Insufficient documentation

## 2015-09-21 DIAGNOSIS — R739 Hyperglycemia, unspecified: Secondary | ICD-10-CM | POA: Diagnosis present

## 2015-09-21 DIAGNOSIS — Z955 Presence of coronary angioplasty implant and graft: Secondary | ICD-10-CM

## 2015-09-21 DIAGNOSIS — Z7982 Long term (current) use of aspirin: Secondary | ICD-10-CM

## 2015-09-21 DIAGNOSIS — I119 Hypertensive heart disease without heart failure: Secondary | ICD-10-CM | POA: Diagnosis not present

## 2015-09-21 DIAGNOSIS — Z79899 Other long term (current) drug therapy: Secondary | ICD-10-CM

## 2015-09-21 DIAGNOSIS — Z7901 Long term (current) use of anticoagulants: Secondary | ICD-10-CM | POA: Diagnosis not present

## 2015-09-21 DIAGNOSIS — Z720 Tobacco use: Secondary | ICD-10-CM | POA: Diagnosis present

## 2015-09-21 LAB — CBC
HCT: 39.8 % (ref 39.0–52.0)
HCT: 40.6 % (ref 39.0–52.0)
Hemoglobin: 12.9 g/dL — ABNORMAL LOW (ref 13.0–17.0)
Hemoglobin: 13.1 g/dL (ref 13.0–17.0)
MCH: 33.7 pg (ref 26.0–34.0)
MCH: 33.5 pg (ref 26.0–34.0)
MCHC: 32.4 g/dL (ref 30.0–36.0)
MCHC: 32.3 g/dL (ref 30.0–36.0)
MCV: 103.9 fL — ABNORMAL HIGH (ref 78.0–100.0)
MCV: 103.8 fL — AB (ref 78.0–100.0)
PLATELETS: 198 10*3/uL (ref 150–400)
PLATELETS: 218 10*3/uL (ref 150–400)
RBC: 3.83 MIL/uL — AB (ref 4.22–5.81)
RBC: 3.91 MIL/uL — ABNORMAL LOW (ref 4.22–5.81)
RDW: 13.9 % (ref 11.5–15.5)
RDW: 13.9 % (ref 11.5–15.5)
WBC: 11.2 10*3/uL — AB (ref 4.0–10.5)
WBC: 9.1 10*3/uL (ref 4.0–10.5)

## 2015-09-21 LAB — I-STAT ARTERIAL BLOOD GAS, ED
ACID-BASE EXCESS: 8 mmol/L — AB (ref 0.0–2.0)
Bicarbonate: 38.8 mEq/L — ABNORMAL HIGH (ref 20.0–24.0)
O2 Saturation: 100 %
TCO2: 41 mmol/L (ref 0–100)
pCO2 arterial: 86.6 mmHg (ref 35.0–45.0)
pH, Arterial: 7.259 — ABNORMAL LOW (ref 7.350–7.450)
pO2, Arterial: 311 mmHg — ABNORMAL HIGH (ref 80.0–100.0)

## 2015-09-21 LAB — BASIC METABOLIC PANEL
Anion gap: 5 (ref 5–15)
BUN: 14 mg/dL (ref 6–20)
CALCIUM: 8.9 mg/dL (ref 8.9–10.3)
CO2: 36 mmol/L — AB (ref 22–32)
CREATININE: 1.03 mg/dL (ref 0.61–1.24)
Chloride: 98 mmol/L — ABNORMAL LOW (ref 101–111)
Glucose, Bld: 134 mg/dL — ABNORMAL HIGH (ref 65–99)
Potassium: 4.9 mmol/L (ref 3.5–5.1)
SODIUM: 139 mmol/L (ref 135–145)

## 2015-09-21 LAB — MRSA PCR SCREENING: MRSA BY PCR: NEGATIVE

## 2015-09-21 LAB — BLOOD GAS, ARTERIAL
Acid-Base Excess: 7.4 mmol/L — ABNORMAL HIGH (ref 0.0–2.0)
BICARBONATE: 32.7 meq/L — AB (ref 20.0–24.0)
DRAWN BY: 225631
O2 Content: 3 L/min
O2 Saturation: 92.6 %
PH ART: 7.368 (ref 7.350–7.450)
Patient temperature: 98.6
TCO2: 34.5 mmol/L (ref 0–100)
pCO2 arterial: 58.2 mmHg (ref 35.0–45.0)
pO2, Arterial: 63.5 mmHg — ABNORMAL LOW (ref 80.0–100.0)

## 2015-09-21 LAB — I-STAT CG4 LACTIC ACID, ED: Lactic Acid, Venous: 1.5 mmol/L (ref 0.5–2.0)

## 2015-09-21 LAB — TROPONIN I
Troponin I: <0.03 ng/mL (ref <0.031)
Troponin I: <0.03 ng/mL (ref <0.031)

## 2015-09-21 LAB — CREATININE, SERUM
Creatinine, Ser: 0.98 mg/dL (ref 0.61–1.24)
GFR calc non Af Amer: >60 mL/min (ref >60)

## 2015-09-21 LAB — BRAIN NATRIURETIC PEPTIDE: B NATRIURETIC PEPTIDE 5: 114 pg/mL — AB (ref 0.0–100.0)

## 2015-09-21 LAB — PROTIME-INR
INR: 0.93 (ref 0.00–1.49)
PROTHROMBIN TIME: 12.7 s (ref 11.6–15.2)

## 2015-09-21 MED ORDER — ACETAMINOPHEN 325 MG PO TABS: 650.0000 mg | ORAL_TABLET | Freq: Four times a day (QID) | ORAL | Status: DC | PRN

## 2015-09-21 MED ORDER — MECLIZINE HCL 25 MG PO TABS
25.0000 mg | ORAL_TABLET | Freq: Three times a day (TID) | ORAL | Status: DC | PRN
  Filled 2015-09-21: qty 1

## 2015-09-21 MED ORDER — METHYLPREDNISOLONE SODIUM SUCC 125 MG IJ SOLR
60.0000 mg | Freq: Four times a day (QID) | INTRAMUSCULAR | Status: DC
  Administered 2015-09-21 – 2015-09-22 (×4): 60 mg via INTRAVENOUS
  Filled 2015-09-21 (×4): qty 2

## 2015-09-21 MED ORDER — ALBUTEROL SULFATE (2.5 MG/3ML) 0.083% IN NEBU: 2.5000 mg | INHALATION_SOLUTION | RESPIRATORY_TRACT | Status: DC | PRN

## 2015-09-21 MED ORDER — LEVOFLOXACIN IN D5W 500 MG/100ML IV SOLN
500.0000 mg | INTRAVENOUS | Status: DC
  Administered 2015-09-22: 500 mg via INTRAVENOUS
  Filled 2015-09-21: qty 100

## 2015-09-21 MED ORDER — MAGNESIUM SULFATE 2 GM/50ML IV SOLN
2.0000 g | Freq: Once | INTRAVENOUS | Status: AC
  Administered 2015-09-21: 2 g via INTRAVENOUS
  Filled 2015-09-21: qty 50

## 2015-09-21 MED ORDER — CETYLPYRIDINIUM CHLORIDE 0.05 % MT LIQD
7.0000 mL | Freq: Two times a day (BID) | OROMUCOSAL | Status: DC
  Administered 2015-09-21 – 2015-09-24 (×6): 7 mL via OROMUCOSAL

## 2015-09-21 MED ORDER — METHYLPREDNISOLONE SODIUM SUCC 125 MG IJ SOLR: 60.0000 mg | Freq: Two times a day (BID) | INTRAMUSCULAR | Status: DC

## 2015-09-21 MED ORDER — ALBUTEROL SULFATE (2.5 MG/3ML) 0.083% IN NEBU
5.0000 mg | INHALATION_SOLUTION | Freq: Once | RESPIRATORY_TRACT | Status: DC
  Filled 2015-09-21: qty 6

## 2015-09-21 MED ORDER — ALBUTEROL (5 MG/ML) CONTINUOUS INHALATION SOLN
10.0000 mg/h | INHALATION_SOLUTION | RESPIRATORY_TRACT | Status: DC
  Administered 2015-09-21: 10 mg/h via RESPIRATORY_TRACT
  Filled 2015-09-21: qty 20

## 2015-09-21 MED ORDER — IPRATROPIUM BROMIDE 0.02 % IN SOLN
RESPIRATORY_TRACT | Status: AC
  Filled 2015-09-21: qty 2.5

## 2015-09-21 MED ORDER — CLOPIDOGREL BISULFATE 75 MG PO TABS
75.0000 mg | ORAL_TABLET | Freq: Every day | ORAL | Status: DC
  Administered 2015-09-22 – 2015-09-24 (×3): 75 mg via ORAL
  Filled 2015-09-21 (×3): qty 1

## 2015-09-21 MED ORDER — SODIUM CHLORIDE 0.9 % IV SOLN
INTRAVENOUS | Status: AC
  Administered 2015-09-21: 14:00:00 via INTRAVENOUS

## 2015-09-21 MED ORDER — IPRATROPIUM-ALBUTEROL 0.5-2.5 (3) MG/3ML IN SOLN
3.0000 mL | Freq: Once | RESPIRATORY_TRACT | Status: AC
  Administered 2015-09-21: 3 mL via RESPIRATORY_TRACT
  Filled 2015-09-21: qty 6

## 2015-09-21 MED ORDER — ENOXAPARIN SODIUM 40 MG/0.4ML ~~LOC~~ SOLN
40.0000 mg | SUBCUTANEOUS | Status: DC
Start: 2015-09-21 — End: 2015-09-24
  Administered 2015-09-21 – 2015-09-23 (×3): 40 mg via SUBCUTANEOUS
  Filled 2015-09-21 (×3): qty 0.4

## 2015-09-21 MED ORDER — SODIUM BICARBONATE 650 MG PO TABS
650.0000 mg | ORAL_TABLET | Freq: Every day | ORAL | Status: DC
  Administered 2015-09-22: 650 mg via ORAL
  Filled 2015-09-21: qty 1

## 2015-09-21 MED ORDER — ONDANSETRON HCL 4 MG PO TABS: 4.0000 mg | ORAL_TABLET | Freq: Four times a day (QID) | ORAL | Status: DC | PRN

## 2015-09-21 MED ORDER — ONDANSETRON HCL 4 MG/2ML IJ SOLN: 4.0000 mg | Freq: Four times a day (QID) | INTRAMUSCULAR | Status: DC | PRN

## 2015-09-21 MED ORDER — IPRATROPIUM BROMIDE 0.02 % IN SOLN: 0.5000 mg | RESPIRATORY_TRACT | Status: DC

## 2015-09-21 MED ORDER — IPRATROPIUM BROMIDE 0.02 % IN SOLN: 0.5000 mg | RESPIRATORY_TRACT | Status: DC | PRN

## 2015-09-21 MED ORDER — LEVALBUTEROL HCL 0.63 MG/3ML IN NEBU
0.6300 mg | INHALATION_SOLUTION | Freq: Four times a day (QID) | RESPIRATORY_TRACT | Status: DC
  Administered 2015-09-21 – 2015-09-24 (×12): 0.63 mg via RESPIRATORY_TRACT
  Filled 2015-09-21 (×15): qty 3

## 2015-09-21 MED ORDER — ALPRAZOLAM 0.25 MG PO TABS
0.2500 mg | ORAL_TABLET | Freq: Two times a day (BID) | ORAL | Status: DC | PRN
  Administered 2015-09-21 – 2015-09-23 (×3): 0.25 mg via ORAL
  Filled 2015-09-21 (×4): qty 1

## 2015-09-21 MED ORDER — TEMAZEPAM 7.5 MG PO CAPS
30.0000 mg | ORAL_CAPSULE | Freq: Every evening | ORAL | Status: DC | PRN
  Administered 2015-09-21 – 2015-09-24 (×2): 30 mg via ORAL
  Filled 2015-09-21: qty 2
  Filled 2015-09-21: qty 4

## 2015-09-21 MED ORDER — ALBUTEROL SULFATE (2.5 MG/3ML) 0.083% IN NEBU: 2.5000 mg | INHALATION_SOLUTION | RESPIRATORY_TRACT | Status: DC

## 2015-09-21 MED ORDER — ACETAMINOPHEN 650 MG RE SUPP: 650.0000 mg | Freq: Four times a day (QID) | RECTAL | Status: DC | PRN

## 2015-09-21 MED ORDER — IPRATROPIUM BROMIDE 0.02 % IN SOLN
0.5000 mg | Freq: Once | RESPIRATORY_TRACT | Status: AC
Start: 2015-09-21 — End: 2015-09-21
  Administered 2015-09-21: 0.5 mg via RESPIRATORY_TRACT
  Filled 2015-09-21: qty 2.5

## 2015-09-21 MED ORDER — LEVOFLOXACIN IN D5W 500 MG/100ML IV SOLN
500.0000 mg | INTRAVENOUS | Status: DC
  Administered 2015-09-21: 500 mg via INTRAVENOUS
  Filled 2015-09-21: qty 100

## 2015-09-21 MED ORDER — LEVALBUTEROL HCL 0.63 MG/3ML IN NEBU
INHALATION_SOLUTION | RESPIRATORY_TRACT | Status: AC
  Filled 2015-09-21: qty 3

## 2015-09-21 MED ORDER — IPRATROPIUM BROMIDE 0.02 % IN SOLN
0.5000 mg | Freq: Four times a day (QID) | RESPIRATORY_TRACT | Status: DC
  Administered 2015-09-21 – 2015-09-24 (×12): 0.5 mg via RESPIRATORY_TRACT
  Filled 2015-09-21 (×12): qty 2.5

## 2015-09-21 MED ORDER — METHYLPREDNISOLONE SODIUM SUCC 125 MG IJ SOLR
125.0000 mg | Freq: Once | INTRAMUSCULAR | Status: AC
  Administered 2015-09-21: 125 mg via INTRAVENOUS
  Filled 2015-09-21: qty 2

## 2015-09-21 NOTE — ED Provider Notes (Signed)
CSN: [id number]     Arrival date & time 09/21/15  1022 History   First MD Initiated Contact with Patient 09/21/15 1043     Chief Complaint  Patient presents with  . COPD     (Consider location/radiation/quality/duration/timing/severity/associated sxs/prior Treatment) HPI Comments: Patient home with difficulty breathing, cough, congestion for the past week. States he has not felt well since he got his flu shot and pneumonia shot last week. History of COPD on chronic oxygen 3 L. States he's had a cough productive of clear mucus. Denies fever. Has intermittent chest pain with coughing. EMS gave him a nebulizer with minimal relief. He denies any leg pain or leg swelling. He denies any chest pain at this time. Denies any back pain.  The history is provided by the patient and the EMS personnel.    Past Medical History  Diagnosis Date  . 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 Has chronic angina; S/P DES to the LCX and DES to the L MAIN 12/2011. Now on Plavix and ASA.   Marland Kitchen Hyperlipidemia   . Hypertension   . Long-term (current) use of anticoagulants     For hx of LV apical aneurysm  . LVH (left ventricular hypertrophy)   . Atrial fibrillation ([place])     Unclear history  . Tobacco abuse   . Systolic heart failure     EF is 35% per cath 12/2011  . Myocardial infarction [facility]) 1981    [name] 01/14/2012  . Left ventricular apical thrombus ([place])     hx/notes 10/06/2014  . On home oxygen therapy     "2.5L 24/7" (10/06/2014)  . COPD (chronic obstructive pulmonary disease) ([place])   . Pneumonia "several times"  . Arthritis     "shoulders; back" (10/06/2014)  . History of gout   . Chronic back pain     "anytime it's wet and cold" (10/06/2014)  . Nocturia   . Syncopal episodes    Past Surgical History  Procedure Laterality Date  . Cholecystectomy    . Hernia repair    . Coronary  angioplasty with stent placement  12/2011    LCX and L main - drug eluting  . Cardiac catheterization  1981    [name] 01/14/2012  . Left heart catheterization with coronary angiogram N/A 01/16/2012    Procedure: LEFT HEART CATHETERIZATION WITH CORONARY ANGIOGRAM;  Surgeon: Sherren Mocha, MD;  Location: [facility] CATH LAB;  Service: Cardiovascular;  Laterality: N/A;  . Percutaneous coronary stent intervention (pci-s) N/A 01/20/2012    Procedure: PERCUTANEOUS CORONARY STENT INTERVENTION (PCI-S);  Surgeon: Sherren Mocha, MD;  Location: [facility] CATH LAB;  Service: Cardiovascular;  Laterality: N/A;   Family History  Problem Relation Age of Onset  . Heart disease Mother   . Cerebral palsy Daughter   . Hypertension Mother    Social History  Substance Use Topics  . Smoking status: Current Every Day Smoker -- 0.75 packs/day for 65 years    Types: Cigarettes  . Smokeless tobacco: Never Used  . Alcohol Use: Yes     Comment: "quit drinking in the 1990's"    Review of Systems  Constitutional: Positive for activity change and appetite change. Negative for fever.  HENT: Negative for congestion and rhinorrhea.   Respiratory: Positive for cough, chest tightness and shortness of breath.   Cardiovascular: Negative for chest pain and leg swelling.  Gastrointestinal: Negative for  nausea, vomiting and abdominal pain.  Genitourinary: Negative for dysuria and hematuria.  Musculoskeletal: Negative for arthralgias.  Skin: Negative for rash.  Neurological: Negative for dizziness, weakness, light-headedness and headaches.  A complete 10 system review of systems was obtained and all systems are negative except as noted in the HPI and PMH.      Allergies  Indomethacin; Lipitor; Pravachol; Procardia; and Zaroxolyn  Home Medications   Prior to Admission medications   Medication Sig Start Date End Date Taking? Authorizing Provider  allopurinol (ZYLOPRIM) 100 MG tablet Take 100 mg by mouth daily.  03/23/12  Yes  Historical Provider, MD  ALPRAZolam Duanne Moron) 0.25 MG tablet TAKE 1 TABLET BY MOUTH TWICE A DAY AS NEEDED FOR ANXIETY 07/20/15  Yes Darlin Coco, MD  aspirin 81 MG tablet Take 81 mg by mouth daily.     Yes Historical Provider, MD  calcium carbonate 200 MG capsule Take 500 mg by mouth daily.     Yes Historical Provider, MD  clopidogrel (PLAVIX) 75 MG tablet Take 1 tablet (75 mg total) by mouth daily. With breakfast 08/11/15  Yes Darlin Coco, MD  CRESTOR 10 MG tablet TAKE 1 TABLET BY MOUTH DAILY 08/21/15  Yes Darlin Coco, MD  [employer] D3 1000 UNITS capsule Take 1,000 Units by mouth daily. 09/11/14  Yes Historical Provider, MD  fish oil-omega-3 fatty acids 1000 MG capsule Take 2 g by mouth daily.     Yes Historical Provider, MD  furosemide (LASIX) 40 MG tablet Take 2 tablets (80 mg total) by mouth 2 (two) times daily. 07/11/15  Yes Darlin Coco, MD  guaiFENesin (MUCINEX) 600 MG 12 hr tablet Take 1,200 mg by mouth 2 (two) times daily as needed for cough or to loosen phlegm.    Yes Historical Provider, MD  ipratropium (ATROVENT) 0.02 % nebulizer solution Take 4-6 times daily scheduled 02/16/15  Yes Brand Males, MD  irbesartan (AVAPRO) 75 MG tablet TAKE 1 TABLET (75 MG TOTAL) BY MOUTH DAILY. 08/28/15  Yes Rhonda G Barrett, PA-C  isosorbide mononitrate (IMDUR) 60 MG 24 hr tablet TAKE 1 TABLET (60 MG TOTAL) BY MOUTH 2 (TWO) TIMES DAILY. 04/18/15  Yes Darlin Coco, MD  KLOR-CON M20 20 MEQ tablet TAKE 1 TABLET BY MOUTH TWICE A DAY 09/18/15  Yes Darlin Coco, MD  meclizine (ANTIVERT) 25 MG tablet Take 1 tablet (25 mg total) by mouth 3 (three) times daily as needed for dizziness. Additional refills from PCP 09/05/15  Yes Darlin Coco, MD  metoprolol (LOPRESSOR) 25 MG tablet Take 1 tablet (25 mg total) by mouth 2 (two) times daily. 07/26/15  Yes Darlin Coco, MD  NITROSTAT 0.4 MG SL tablet PLACE 1 TABLET (0.4 MG TOTAL) UNDER THE TONGUE EVERY 5 (FIVE) MINUTES AS NEEDED. 04/28/15  Yes Darlin Coco, MD  PROAIR HFA 108 (90 BASE) MCG/ACT inhaler INHALE 2 PUFFS INTO LUNGS EVERY 6 HOURS AS NEEDED FOR WHEEZING 07/18/13  Yes Darlin Coco, MD  sodium bicarbonate 650 MG tablet Take 650 mg by mouth daily.    Yes Historical Provider, MD  temazepam (RESTORIL) 30 MG capsule TAKE ONE CAPSULE BY MOUTH AT BEDTIME AS NEEDED 06/14/15  Yes Darlin Coco, MD  traMADol (ULTRAM) 50 MG tablet Take 50 mg by mouth every 6 (six) hours as needed for moderate pain.  06/13/14  Yes Historical Provider, MD   BP 139/77 mmHg  Pulse 83  Resp 21  Ht 5\' 10"  (1.778 m)  Wt 175 lb (79.379 kg)  BMI 25.11 kg/m2  SpO2 96%  Physical Exam  Constitutional: He is oriented to person, place, and time. He appears well-developed and well-nourished. He appears distressed.  Increased work of breathing, moist cough  HENT:  Head: Normocephalic and atraumatic.  Mouth/Throat: Oropharynx is clear and moist. No oropharyngeal exudate.  Eyes: Conjunctivae and EOM are normal. Pupils are equal, round, and reactive to light.  Neck: Normal range of motion. Neck supple.  No meningismus.  Cardiovascular: Normal rate, regular rhythm, normal heart sounds and intact distal pulses.   No murmur heard. Pulmonary/Chest: He is in respiratory distress. He has wheezes. He has rales.  Abdominal: Soft. There is no tenderness. There is no rebound and no guarding.  Musculoskeletal: Normal range of motion. He exhibits edema. He exhibits no tenderness.  Trace pedal edema bilaterally  Neurological: He is alert and oriented to person, place, and time. No cranial nerve deficit. He exhibits normal muscle tone. Coordination normal.  No ataxia on finger to nose bilaterally. No pronator drift. 5/5 strength throughout. CN 2-12 intact.Equal grip strength. Sensation intact.   Skin: Skin is warm.  Psychiatric: He has a normal mood and affect. His behavior is normal.  Nursing note and vitals reviewed.   ED Course  Procedures (including critical care  time) Labs Review Labs Reviewed  BASIC METABOLIC PANEL - Abnormal; Notable for the following:    Chloride 98 (*)    CO2 36 (*)    Glucose, Bld 134 (*)    All other components within normal limits  CBC - Abnormal; Notable for the following:    WBC 11.2 (*)    RBC 3.83 (*)    Hemoglobin 12.9 (*)    MCV 103.9 (*)    All other components within normal limits  BRAIN NATRIURETIC PEPTIDE - Abnormal; Notable for the following:    B Natriuretic Peptide 114.0 (*)    All other components within normal limits  CBC - Abnormal; Notable for the following:    RBC 3.91 (*)    MCV 103.8 (*)    All other components within normal limits  BLOOD GAS, ARTERIAL - Abnormal; Notable for the following:    pCO2 arterial 58.2 (*)    pO2, Arterial 63.5 (*)    Bicarbonate 32.7 (*)    Acid-Base Excess 7.4 (*)    All other components within normal limits  I-STAT ARTERIAL BLOOD GAS, ED - Abnormal; Notable for the following:    pH, Arterial 7.259 (*)    pCO2 arterial 86.6 (*)    pO2, Arterial 311.0 (*)    Bicarbonate 38.8 (*)    Acid-Base Excess 8.0 (*)    All other components within normal limits  MRSA PCR SCREENING  TROPONIN I  PROTIME-INR  CREATININE, SERUM  TROPONIN I  BASIC METABOLIC PANEL  CBC  I-STAT CG4 LACTIC ACID, ED  I-STAT CG4 LACTIC ACID, ED    Imaging Review Dg Chest Portable 1 View  09/21/2015  CLINICAL DATA:  Short of breath and chest pain EXAM: PORTABLE CHEST 1 VIEW COMPARISON:  10/06/2014 FINDINGS: Mild hyperinflation with findings consistent with COPD. Density in the left lung base has improved in the interval. Some of this may be chronic lingular atelectasis. This was present on a CT in 2014. Mild pleural thickening or effusion. Right lung clear Negative for heart failure IMPRESSION: Left lower lobe pleural and parenchymal density has improved since the prior study. This is likely related to chronic scarring based on prior CT. No superimposed acute abnormality. Electronically Signed    By: Franchot Gallo  M.D.   On: 09/21/2015 11:08   I have personally reviewed and evaluated these images and lab results as part of my medical decision-making.   EKG Interpretation None      MDM   Final diagnoses:  Acute respiratory failure with hypercapnia (HCC)   COPD exacerbation with cough, shortness of breath and wheezing. He is given nebulizers and steroids.  ABG shows CO2 retention with pH of 7.2 and PCO2 of 86.  He is placed on BiPAP. Chest x-ray shows stable scarring left base.  Nebs, steroids, magnesium, antibiotics for COPD exacerbation. Comfortable on bipap.   D/w NP Black who requests CCM consult.  Seen by Dr. Chase Caller who feels patient can be admitted to stepdown by hospitalist.   CRITICAL CARE Performed by: Ezequiel Essex Total critical care time: 40 minutes Critical care time was exclusive of separately billable procedures and treating other patients. Critical care was necessary to treat or prevent imminent or life-threatening deterioration. Critical care was time spent personally by me on the following activities: development of treatment plan with patient and/or surrogate as well as nursing, discussions with consultants, evaluation of patient's response to treatment, examination of patient, obtaining history from patient or surrogate, ordering and performing treatments and interventions, ordering and review of laboratory studies, ordering and review of radiographic studies, pulse oximetry and re-evaluation of patient's condition.    ED ECG REPORT   Date: 09/21/2015  Rate: 63  Rhythm: normal sinus rhythm  QRS Axis: normal  Intervals: normal  ST/T Wave abnormalities: nonspecific ST/T changes  Conduction Disutrbances:none  Narrative Interpretation:   Old EKG Reviewed: unchanged  I have personally reviewed the EKG tracing and agree with the computerized printout as noted.   Ezequiel Essex, MD 09/21/15 [phone]

## 2015-09-21 NOTE — ED Notes (Signed)
From home via [facility] for COPD, SOB, cough, denies CP on arrival, VSS, 18 left forearm,   5mg  Alb 0.5mg  Atrovent

## 2015-09-21 NOTE — H&P (Signed)
[facility] History and Physical  RICKE KIMOTO DJM:[id number] DOB: 1933-05-04 DOA: 09/21/2015  Referring physician: Wyvonnia Dusky PCP: [name], MD   Chief Complaint: sob  HPI: Sean Figueroa is a 81 y.o. male with a past medical history that includes COPD on 3 L of oxygen at home, continued tobacco use, CAD, chronic respiratory failure, chronic systolic heart failure, PAF, presents to emergency department with the chief complaint of worsening shortness of breath. Initial evaluation reveals acute on chronic respiratory failure related to COPD exacerbation.  Patient reports last week he went to his primary care provider and got the flu shot and the pneumonia vaccine. Shortly thereafter he developed gradual worsening of his shortness of breath. Associated symptoms include worsening productive cough describing the mucus is clear. He denies any chest pain palpitations fever chills. He denies any abdominal pain nausea vomiting diarrhea constipation. He denies headache visual disturbances numbness or tingling of his extremities. He took home nebs with little improvement.  He called EMS who provided him with a nebulizer treatment and he got minimal relief. Transported to the emergency department.  Workup in the emergency department includes chest x-ray revealing left lower lobe pleural and parenchymal density has improved since the prior study. This is likely related to chronic scarring based on prior CT. No superimposed acute abnormality, [place] blood gas with a pH of 7.25, PCO2 86.6, PO2 311, bicarbonate 38.8, lactic acid 2.22, basic metabolic panel with chloride of 98 CO2 36 serum glucose 134.  Emergency department blood pressure somewhat soft respirations 33. Due to his respiratory distress he was placed on BiPAP. At the time of my exam respirations 23 oxygen saturation 100% on BiPAP. In the emergency department he is provided with Solu-Medrol, mag sulfate, Levaquin, DuoNeb's 2.    Review of  Systems:  10 point review of systems complete and all systems are negative except as indicated in history of present illness  Past Medical History  Diagnosis Date  . 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 Has chronic angina; S/P DES to the LCX and DES to the L MAIN 12/2011. Now on Plavix and ASA.   Marland Kitchen Hyperlipidemia   . Hypertension   . Long-term (current) use of anticoagulants     For hx of LV apical aneurysm  . LVH (left ventricular hypertrophy)   . Atrial fibrillation ([place])     Unclear history  . Tobacco abuse   . Systolic heart failure     EF is 35% per cath 12/2011  . Myocardial infarction [facility]) 1981    [name] 01/14/2012  . Left ventricular apical thrombus ([place])     hx/notes 10/06/2014  . On home oxygen therapy     "2.5L 24/7" (10/06/2014)  . COPD (chronic obstructive pulmonary disease) ([place])   . Pneumonia "several times"  . Arthritis     "shoulders; back" (10/06/2014)  . History of gout   . Chronic back pain     "anytime it's wet and cold" (10/06/2014)  . Nocturia   . Syncopal episodes    Past Surgical History  Procedure Laterality Date  . Cholecystectomy    . Hernia repair    . Coronary angioplasty with stent placement  12/2011    LCX and L main - drug eluting  . Cardiac catheterization  1981    [name] 01/14/2012  . Left heart catheterization with coronary angiogram N/A 01/16/2012  Procedure: LEFT HEART CATHETERIZATION WITH CORONARY ANGIOGRAM;  Surgeon: Sherren Mocha, MD;  Location[facility] CATH LAB;  Service: Cardiovascular;  Laterality: N/A;  . Percutaneous coronary stent intervention (pci-s) N/A 01/20/2012    Procedure: PERCUTANEOUS CORONARY STENT INTERVENTION (PCI-S);  Surgeon: Sherren Mocha, MD;  Location: [facility] CATH LAB;  Service: Cardiovascular;  Laterality: N/A;   Social History:  reports that he has been smoking Cigarettes.  He has a 48.75 pack-year smoking  history. He has never used smokeless tobacco. He reports that he drinks alcohol. He reports that he does not use illicit drugs.  Allergies  Allergen Reactions  . Indomethacin     unknown  . Lipitor [Atorvastatin Calcium]     Leg pain  . Pravachol     Leg pain  . Procardia [Nifedipine]   . Zaroxolyn [Metolazone]     Family History  Problem Relation Age of Onset  . Heart disease Mother   . Cerebral palsy Daughter   . Hypertension Mother      Prior to Admission medications   Medication Sig Start Date End Date Taking? Authorizing Provider  allopurinol (ZYLOPRIM) 100 MG tablet Take 100 mg by mouth daily.  03/23/12  Yes Historical Provider, MD  ALPRAZolam Duanne Moron) 0.25 MG tablet TAKE 1 TABLET BY MOUTH TWICE A DAY AS NEEDED FOR ANXIETY 07/20/15  Yes Darlin Coco, MD  aspirin 81 MG tablet Take 81 mg by mouth daily.     Yes Historical Provider, MD  calcium carbonate 200 MG capsule Take 500 mg by mouth daily.     Yes Historical Provider, MD  clopidogrel (PLAVIX) 75 MG tablet Take 1 tablet (75 mg total) by mouth daily. With breakfast 08/11/15  Yes Darlin Coco, MD  CRESTOR 10 MG tablet TAKE 1 TABLET BY MOUTH DAILY 08/21/15  Yes Darlin Coco, MD  [employer]S D3 1000 UNITS capsule Take 1,000 Units by mouth daily. 09/11/14  Yes Historical Provider, MD  fish oil-omega-3 fatty acids 1000 MG capsule Take 2 g by mouth daily.     Yes Historical Provider, MD  furosemide (LASIX) 40 MG tablet Take 2 tablets (80 mg total) by mouth 2 (two) times daily. 07/11/15  Yes Darlin Coco, MD  guaiFENesin (MUCINEX) 600 MG 12 hr tablet Take 1,200 mg by mouth 2 (two) times daily as needed for cough or to loosen phlegm.    Yes Historical Provider, MD  ipratropium (ATROVENT) 0.02 % nebulizer solution Take 4-6 times daily scheduled 02/16/15  Yes Brand Males, MD  irbesartan (AVAPRO) 75 MG tablet TAKE 1 TABLET (75 MG TOTAL) BY MOUTH DAILY. 08/28/15  Yes Rhonda G Barrett, PA-C  isosorbide mononitrate (IMDUR) 60 MG 24  hr tablet TAKE 1 TABLET (60 MG TOTAL) BY MOUTH 2 (TWO) TIMES DAILY. 04/18/15  Yes Darlin Coco, MD  KLOR-CON M20 20 MEQ tablet TAKE 1 TABLET BY MOUTH TWICE A DAY 09/18/15  Yes Darlin Coco, MD  meclizine (ANTIVERT) 25 MG tablet Take 1 tablet (25 mg total) by mouth 3 (three) times daily as needed for dizziness. Additional refills from PCP 09/05/15  Yes Darlin Coco, MD  metoprolol (LOPRESSOR) 25 MG tablet Take 1 tablet (25 mg total) by mouth 2 (two) times daily. 07/26/15  Yes Darlin Coco, MD  NITROSTAT 0.4 MG SL tablet PLACE 1 TABLET (0.4 MG TOTAL) UNDER THE TONGUE EVERY 5 (FIVE) MINUTES AS NEEDED. 04/28/15  Yes Darlin Coco, MD  PROAIR HFA 108 (90 BASE) MCG/ACT inhaler INHALE 2 PUFFS INTO LUNGS EVERY 6 HOURS AS NEEDED FOR WHEEZING 07/18/13  Yes Darlin Coco, MD  sodium bicarbonate 650 MG tablet Take 650 mg by mouth daily.    Yes Historical Provider, MD  temazepam (RESTORIL) 30 MG capsule TAKE ONE CAPSULE BY MOUTH AT BEDTIME AS NEEDED 06/14/15  Yes Darlin Coco, MD  traMADol (ULTRAM) 50 MG tablet Take 50 mg by mouth every 6 (six) hours as needed for moderate pain.  06/13/14  Yes Historical Provider, MD   Physical Exam: Filed Vitals:   09/21/15 1045 09/21/15 1100 09/21/15 1200 09/21/15 1218  BP: 104/56 117/67 97/63 97/63   Pulse: 64 59  66  Resp: 18 28 33 23  Height:      Weight:      SpO2: 100% 100%  100%    Wt Readings from Last 3 Encounters:  09/21/15 79.379 kg (175 lb)  08/11/15 79.379 kg (175 lb)  07/26/15 77.747 kg (171 lb 6.4 oz)    General:  Appears calm and comfortable, no distress on BiPAP Eyes: PERRL, normal lids, irises & conjunctiva ENT: grossly normal hearing, lips & tongue Neck: no LAD, masses or thyromegaly Cardiovascular: RRR, no m/r/g. Trace lower extremity edema Telemetry: SR, no arrhythmias  Respiratory: Increased work of breathing with conversation. BiPAP in place tolerating well. Breath sounds quite diminished throughout. Very faint end  expiratory wheeze Abdomen: soft, ntnd Skin: no rash or induration seen on limited exam Musculoskeletal: grossly normal tone BUE/BLE Psychiatric: grossly normal mood and affect, speech fluent and appropriate Neurologic: grossly non-focal.          Labs on Admission:  Basic Metabolic Panel:  Recent Labs Lab 09/21/15 1108  NA 139  K 4.9  CL 98*  CO2 36*  GLUCOSE 134*  BUN 14  CREATININE 1.03  CALCIUM 8.9   Liver Function Tests: No results for input(s): AST, ALT, ALKPHOS, BILITOT, PROT, ALBUMIN in the last 168 hours. No results for input(s): LIPASE, AMYLASE in the last 168 hours. No results for input(s): AMMONIA in the last 168 hours. CBC:  Recent Labs Lab 09/21/15 1108  WBC 11.2*  HGB 12.9*  HCT 39.8  MCV 103.9*  PLT 198   Cardiac Enzymes:  Recent Labs Lab 09/21/15 1108  TROPONINI <0.03    BNP (last 3 results)  Recent Labs  09/21/15 1108  BNP 114.0*    ProBNP (last 3 results)  Recent Labs  10/06/14 1000  PROBNP 1522.0*    CBG: No results for input(s): GLUCAP in the last 168 hours.  Radiological Exams on Admission: Dg Chest Portable 1 View  09/21/2015  CLINICAL DATA:  Short of breath and chest pain EXAM: PORTABLE CHEST 1 VIEW COMPARISON:  10/06/2014 FINDINGS: Mild hyperinflation with findings consistent with COPD. Density in the left lung base has improved in the interval. Some of this may be chronic lingular atelectasis. This was present on a CT in 2014. Mild pleural thickening or effusion. Right lung clear Negative for heart failure IMPRESSION: Left lower lobe pleural and parenchymal density has improved since the prior study. This is likely related to chronic scarring based on prior CT. No superimposed acute abnormality. Electronically Signed   By: Franchot Gallo M.D.   On: 09/21/2015 11:08    EKG: Independently reviewed his rhythm with no acute changes  Assessment/Plan Principal Problem:   Hypercapnic respiratory failure (HCC) Active  Problems:   COPD (chronic obstructive pulmonary disease) (HCC)   Tobacco abuse   CAD (coronary artery disease)   PAF (paroxysmal atrial fibrillation) (HCC)   COPD, severe (HCC)   Chronic systolic heart failure (  [place])   #1. Hypercapnic respiratory failure: Acute on chronic. Likely related to COPD exacerbation. Admit to step down. Upon presentation he was in respiratory distress ABGs as noted above. Placed on BiPAP and respiratory effort improved. Will continue nebulizers antibiotics and steroids. Will wean as able to baseline. At home he wears 3 L nasal cannula. Pulmonology notified and will consult.  #2 COPD exacerbation. Patient reports flu shot and pneumonia vaccine last week. Denies any other sick contacts or recent illnesses. He saw his pulmonologist in September of this year. See note dated 08/11/2015. COPD classified as severe. Patient continues to smoke. Evaluated by pulmonology in ED who recommend BiPaP on and off during the day plus at bedtime for 24 48 hours as well as aggressive bronchodilation nebulization, steroids.   #3. Chronic systolic heart failure. Review indicates the EF 45%. Last cardiology visit in May of this year. See note dated May 2016.  #4. CAD. Chart review indicates history of ischemic heart disease status post remote apical myocardial infarction and status post drug eluding stent placement 2013. Cardiology note indicates he is on long-term dual antiplatelet. Will continue this. Denies chest pain. EKG without acute changes  #5. PAF: EKG as noted above. Will continue home meds. Currently in sinus  ramaswamy Code Status: full DVT Prophylaxis: Family Communication: none present Disposition Plan: home when ready  Time spent: 27 minutes  [facility]

## 2015-09-21 NOTE — Progress Notes (Signed)
ANTIBIOTIC CONSULT NOTE - INITIAL  Pharmacy Consult for Levaquin Indication: COPD exacerbation  Allergies  Allergen Reactions  . Indomethacin     unknown  . Lipitor [Atorvastatin Calcium]     Leg pain  . Pravachol     Leg pain  . Procardia [Nifedipine]   . Zaroxolyn [Metolazone]   Patient Measurements: Height: 5\' 10"  (177.8 cm) Weight: 175 lb (79.379 kg) IBW/kg (Calculated) : 73 Vital Signs: BP: 150/112 mmHg (11/03 1430) Pulse Rate: 77 (11/03 1430) Labs:  Recent Labs  09/21/15 1108  WBC 11.2*  HGB 12.9*  PLT 198  CREATININE 1.03   Estimated Creatinine Clearance: 59.1 mL/min (by C-G formula based on Cr of 1.03).  Microbiology: No results found for this or any previous visit (from the past 720 hour(s)).  Medical History: Past Medical History  Diagnosis Date  . 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 Has chronic angina; S/P DES to the LCX and DES to the L MAIN 12/2011. Now on Plavix and ASA.   Marland Kitchen Hyperlipidemia   . Hypertension   . Long-term (current) use of anticoagulants     For hx of LV apical aneurysm  . LVH (left ventricular hypertrophy)   . Atrial fibrillation ([place])     Unclear history  . Tobacco abuse   . Systolic heart failure     EF is 35% per cath 12/2011  . Myocardial infarction [facility]) 1981    [name] 01/14/2012  . Left ventricular apical thrombus ([place])     hx/notes 10/06/2014  . On home oxygen therapy     "2.5L 24/7" (10/06/2014)  . COPD (chronic obstructive pulmonary disease) ([place])   . Pneumonia "several times"  . Arthritis     "shoulders; back" (10/06/2014)  . History of gout   . Chronic back pain     "anytime it's wet and cold" (10/06/2014)  . Nocturia   . Syncopal episodes     Medications:  Anti-infectives    Start     Dose/Rate Route Frequency Ordered Stop   09/21/15 1230  levofloxacin (LEVAQUIN) IVPB 500 mg  Status:   Discontinued     500 mg 100 mL/hr over 60 Minutes Intravenous Every 24 hours 09/21/15 1222 09/21/15 1538     Assessment: 81 year old male with COPD on 3L oxygen prior to admission now admitted with worsening shortness of breath and concern for COPD exacerbation. Pharmacy has been consulted to dose Levaquin for this indication.   The patient's WBC is mildly elevated and SCr is 1.03 which may be falsely low with current age. Estimated CrCl ~ 50 to 60 mL/min. Lactic acid is 1.5. Patient's temperature has not yet been recorded. Last dose of Levaquin 500 given at 1500 today.   Goal of Therapy:  Clinical resolution of infection  Plan:  Levaquin 500 mg po every 24 hours.  Monitor renal function, clinical status, and culture results.   Thanks,   Sloan Leiter, PharmD, BCPS Clinical Pharmacist [phone] 09/21/2015,3:55 PM

## 2015-09-21 NOTE — Consult Note (Signed)
Name: Sean Figueroa MRN: [id number] DOB: 1932-12-29    ADMISSION DATE:  09/21/2015 CONSULTATION DATE:  11/3  REFERRING MD :  EDP  CHIEF COMPLAINT:  SOB  BRIEF PATIENT DESCRIPTION: 81 year old male with history of COPD on home O2 presented to [facility] ED 11/3 complaining of progressive shortness of breath and minimally productive cough over the past week. He was started on BiPAP in the emergency department with some improvement, PCCM to see.  SIGNIFICANT EVENTS    STUDIES:  PFT 2015: showed FEV1 31%, ratio 42, no sign bronchodilator response, FVC 52%, diffusing capacity was unable to be completed. +Air trapping . Echo 2014: LVEF 40-45%, grade 1 DD   HISTORY OF PRESENT ILLNESS:  81 year old male with past medical history as below, which includes COPD on home O2, current smoker, systolic CHF, hypertension, and atrial fibrillation. He is followed by MR in pulmonary clinic. 11/3 he presented to the emergency department from home with complaints of shortness of breath and minimally productive cough which has been progressive over the past week. Cough is intermittently productive of gray sputum. He has intermittent substernal chest pain as well, which he is unable to clearly describe. No radiation and aggravating, alleviating factors noted. Questionable history of subjective fevers. In the emergency department he was notably dyspneic, and ABG showed a respiratory acidosis. He was started on BiPAP which he tolerated well. Currently he has been taken off BiPAP and has no complaints. States breathing is at baseline.  PAST MEDICAL HISTORY :   has a past medical history of Coronary artery disease; Hyperlipidemia; Hypertension; Long-term (current) use of anticoagulants; LVH (left ventricular hypertrophy); Atrial fibrillation ([place]); Tobacco abuse; Systolic heart failure; Myocardial infarction ([place]) (1981); Left ventricular apical thrombus ([place]); On home oxygen therapy; COPD (chronic obstructive  pulmonary disease) ([place]); Pneumonia ("several times"); Arthritis; History of gout; Chronic back pain; Nocturia; and Syncopal episodes.  has past surgical history that includes Cholecystectomy; Hernia repair; Coronary angioplasty with stent (12/2011); Cardiac catheterization (1981); left heart catheterization with coronary angiogram (N/A, 01/16/2012); and percutaneous coronary stent intervention (pci-s) (N/A, 01/20/2012). Prior to Admission medications   Medication Sig Start Date End Date Taking? Authorizing Provider  allopurinol (ZYLOPRIM) 100 MG tablet Take 100 mg by mouth daily.  03/23/12  Yes Historical Provider, MD  ALPRAZolam Duanne Moron) 0.25 MG tablet TAKE 1 TABLET BY MOUTH TWICE A DAY AS NEEDED FOR ANXIETY 07/20/15  Yes Darlin Coco, MD  aspirin 81 MG tablet Take 81 mg by mouth daily.     Yes Historical Provider, MD  calcium carbonate 200 MG capsule Take 500 mg by mouth daily.     Yes Historical Provider, MD  clopidogrel (PLAVIX) 75 MG tablet Take 1 tablet (75 mg total) by mouth daily. With breakfast 08/11/15  Yes Darlin Coco, MD  CRESTOR 10 MG tablet TAKE 1 TABLET BY MOUTH DAILY 08/21/15  Yes Darlin Coco, MD  [employer] D3 1000 UNITS capsule Take 1,000 Units by mouth daily. 09/11/14  Yes Historical Provider, MD  fish oil-omega-3 fatty acids 1000 MG capsule Take 2 g by mouth daily.     Yes Historical Provider, MD  furosemide (LASIX) 40 MG tablet Take 2 tablets (80 mg total) by mouth 2 (two) times daily. 07/11/15  Yes Darlin Coco, MD  guaiFENesin (MUCINEX) 600 MG 12 hr tablet Take 1,200 mg by mouth 2 (two) times daily as needed for cough or to loosen phlegm.    Yes Historical Provider, MD  ipratropium (ATROVENT) 0.02 % nebulizer solution Take 4-6 times  daily scheduled 02/16/15  Yes Brand Males, MD  irbesartan (AVAPRO) 75 MG tablet TAKE 1 TABLET (75 MG TOTAL) BY MOUTH DAILY. 08/28/15  Yes Rhonda G Barrett, PA-C  isosorbide mononitrate (IMDUR) 60 MG 24 hr tablet TAKE 1 TABLET (60 MG TOTAL) BY  MOUTH 2 (TWO) TIMES DAILY. 04/18/15  Yes Darlin Coco, MD  KLOR-CON M20 20 MEQ tablet TAKE 1 TABLET BY MOUTH TWICE A DAY 09/18/15  Yes Darlin Coco, MD  meclizine (ANTIVERT) 25 MG tablet Take 1 tablet (25 mg total) by mouth 3 (three) times daily as needed for dizziness. Additional refills from PCP 09/05/15  Yes Darlin Coco, MD  metoprolol (LOPRESSOR) 25 MG tablet Take 1 tablet (25 mg total) by mouth 2 (two) times daily. 07/26/15  Yes Darlin Coco, MD  NITROSTAT 0.4 MG SL tablet PLACE 1 TABLET (0.4 MG TOTAL) UNDER THE TONGUE EVERY 5 (FIVE) MINUTES AS NEEDED. 04/28/15  Yes Darlin Coco, MD  PROAIR HFA 108 (90 BASE) MCG/ACT inhaler INHALE 2 PUFFS INTO LUNGS EVERY 6 HOURS AS NEEDED FOR WHEEZING 07/18/13  Yes Darlin Coco, MD  sodium bicarbonate 650 MG tablet Take 650 mg by mouth daily.    Yes Historical Provider, MD  temazepam (RESTORIL) 30 MG capsule TAKE ONE CAPSULE BY MOUTH AT BEDTIME AS NEEDED 06/14/15  Yes Darlin Coco, MD  traMADol (ULTRAM) 50 MG tablet Take 50 mg by mouth every 6 (six) hours as needed for moderate pain.  06/13/14  Yes Historical Provider, MD   Allergies  Allergen Reactions  . Indomethacin     unknown  . Lipitor [Atorvastatin Calcium]     Leg pain  . Pravachol     Leg pain  . Procardia [Nifedipine]   . Zaroxolyn [Metolazone]     FAMILY HISTORY:  family history includes Cerebral palsy in his daughter; Heart disease in his mother; Hypertension in his mother. SOCIAL HISTORY:  reports that he has been smoking Cigarettes.  He has a 48.75 pack-year smoking history. He has never used smokeless tobacco. He reports that he drinks alcohol. He reports that he does not use illicit drugs.  REVIEW OF SYSTEMS:   Bolds are positive  Constitutional: weight loss, gain, night sweats, Fevers, chills, fatigue .  HEENT: headaches, Sore throat, sneezing, nasal congestion, post nasal drip, Difficulty swallowing, Tooth/dental problems, visual complaints visual changes,  ear ache CV:  chest pain, radiates: ,Orthopnea, PND, swelling in lower extremities, dizziness, palpitations, syncope.  GI  heartburn, indigestion, abdominal pain, nausea, vomiting, diarrhea, change in bowel habits, loss of appetite, bloody stools.  Resp: cough, minimally productive for grey sputum, hemoptysis, dyspnea, chest pain, pleuritic.  Skin: rash or itching or icterus GU: dysuria, change in color of urine, urgency or frequency. flank pain, hematuria  MS: joint pain or swelling. decreased range of motion  Psych: change in mood or affect. depression or anxiety.  Neuro: difficulty with speech, weakness, numbness, ataxia   SUBJECTIVE:   VITAL SIGNS: Pulse Rate:  [59-66] 66 (11/03 1218) Resp:  [18-33] 23 (11/03 1218) BP: (97-136)/(56-67) 97/63 mmHg (11/03 1218) SpO2:  [100 %] 100 % (11/03 1218) FiO2 (%):  [40 %] 40 % (11/03 1218) Weight:  [79.379 kg (175 lb)] 79.379 kg (175 lb) (11/03 1027)  PHYSICAL EXAMINATION: General:  Chronically ill appearing male in NAD now off BiPAP Neuro:  Alert, oriented, non-focal. Speech somewhat unclear, but suspect this is baseline HEENT:  [place]/AT, no appreciable JVD Cardiovascular:  RRR, no MRG Lungs:  No wheeze, unlabored Abdomen:  Soft, non-tender, non-distended Musculoskeletal:  No acute deformities or ROM limitation Skin:  Venous stasis ulcers with some skin breakdown on anterior surface of bilateral lower extremities. L > R with some erythema. +1 bilateral lower extremity pitting edema.    Recent Labs Lab 09/21/15 1108  NA 139  K 4.9  CL 98*  CO2 36*  BUN 14  CREATININE 1.03  GLUCOSE 134*    Recent Labs Lab 09/21/15 1108  HGB 12.9*  HCT 39.8  WBC 11.2*  PLT 198   Dg Chest Portable 1 View  09/21/2015  CLINICAL DATA:  Short of breath and chest pain EXAM: PORTABLE CHEST 1 VIEW COMPARISON:  10/06/2014 FINDINGS: Mild hyperinflation with findings consistent with COPD. Density in the left lung base has improved in the interval. Some of  this may be chronic lingular atelectasis. This was present on a CT in 2014. Mild pleural thickening or effusion. Right lung clear Negative for heart failure IMPRESSION: Left lower lobe pleural and parenchymal density has improved since the prior study. This is likely related to chronic scarring based on prior CT. No superimposed acute abnormality. Electronically Signed   By: Franchot Gallo M.D.   On: 09/21/2015 11:08    ASSESSMENT / PLAN:  Acute on chronic respiratory failure - r/t AECOPD +/- mild volume overload in setting systolic CHF AECOPD  Tobacco abuse  Hx systolic CHF   PLAN -  Ok for admit to SDU per [facility]  Supplemental O2 as need to keep sats 88-92% - wears 3L [place] at home BiPAP today 2 hours on, 4 hours off Mandatory QHS BiPAP while inpatient IV steroids  levaquin  BD's - atrovent/xopenex q6 horus, PRN xopenex PRN xanax  Gentle diuresis  Pulmonary hygiene - IS, flutter  Trend troponin   HTN  ?AFib  CAD Gout  Chronic pain  PLAN -  Per primary   PCCM will follow as consult. Pulmonary outpatient follow-up appointment made at time of admission   Georgann Housekeeper, [facility] Pulmonology/Critical Care Pager [phone] or [phone]  09/21/2015 2:05 PM

## 2015-09-22 DIAGNOSIS — J438 Other emphysema: Secondary | ICD-10-CM

## 2015-09-22 DIAGNOSIS — I2510 Atherosclerotic heart disease of native coronary artery without angina pectoris: Secondary | ICD-10-CM

## 2015-09-22 DIAGNOSIS — I10 Essential (primary) hypertension: Secondary | ICD-10-CM

## 2015-09-22 DIAGNOSIS — J441 Chronic obstructive pulmonary disease with (acute) exacerbation: Principal | ICD-10-CM

## 2015-09-22 DIAGNOSIS — I5022 Chronic systolic (congestive) heart failure: Secondary | ICD-10-CM

## 2015-09-22 DIAGNOSIS — I480 Paroxysmal atrial fibrillation: Secondary | ICD-10-CM

## 2015-09-22 LAB — CBC
HCT: 39.3 % (ref 39.0–52.0)
Hemoglobin: 12.9 g/dL — ABNORMAL LOW (ref 13.0–17.0)
MCH: 33.9 pg (ref 26.0–34.0)
MCHC: 32.8 g/dL (ref 30.0–36.0)
MCV: 103.1 fL — AB (ref 78.0–100.0)
PLATELETS: 211 10*3/uL (ref 150–400)
RBC: 3.81 MIL/uL — ABNORMAL LOW (ref 4.22–5.81)
RDW: 13.9 % (ref 11.5–15.5)
WBC: 12.1 10*3/uL — ABNORMAL HIGH (ref 4.0–10.5)

## 2015-09-22 LAB — BASIC METABOLIC PANEL
Anion gap: 7 (ref 5–15)
BUN: 16 mg/dL (ref 6–20)
CALCIUM: 9 mg/dL (ref 8.9–10.3)
CHLORIDE: 97 mmol/L — AB (ref 101–111)
CO2: 33 mmol/L — AB (ref 22–32)
CREATININE: 1.12 mg/dL (ref 0.61–1.24)
GFR calc non Af Amer: >60 mL/min (ref >60)
Glucose, Bld: 197 mg/dL — ABNORMAL HIGH (ref 65–99)
Potassium: 4.2 mmol/L (ref 3.5–5.1)
Sodium: 137 mmol/L (ref 135–145)

## 2015-09-22 LAB — GLUCOSE, CAPILLARY
GLUCOSE-CAPILLARY: 151 mg/dL — AB (ref 65–99)
Glucose-Capillary: 248 mg/dL — ABNORMAL HIGH (ref 65–99)

## 2015-09-22 MED ORDER — GI COCKTAIL ~~LOC~~
30.0000 mL | Freq: Once | ORAL | Status: AC
  Administered 2015-09-22: 30 mL via ORAL
  Filled 2015-09-22: qty 30

## 2015-09-22 MED ORDER — NITROGLYCERIN 0.4 MG SL SUBL
SUBLINGUAL_TABLET | SUBLINGUAL | Status: AC
  Administered 2015-09-22: 0.4 mg
  Filled 2015-09-22: qty 1

## 2015-09-22 MED ORDER — METOPROLOL TARTRATE 25 MG PO TABS
25.0000 mg | ORAL_TABLET | Freq: Two times a day (BID) | ORAL | Status: DC
  Administered 2015-09-22 – 2015-09-24 (×4): 25 mg via ORAL
  Filled 2015-09-22 (×4): qty 1

## 2015-09-22 MED ORDER — METHYLPREDNISOLONE SODIUM SUCC 125 MG IJ SOLR
60.0000 mg | Freq: Three times a day (TID) | INTRAMUSCULAR | Status: DC
  Administered 2015-09-22 – 2015-09-23 (×2): 60 mg via INTRAVENOUS
  Filled 2015-09-22 (×2): qty 2

## 2015-09-22 MED ORDER — INSULIN ASPART 100 UNIT/ML ~~LOC~~ SOLN
0.0000 [IU] | Freq: Three times a day (TID) | SUBCUTANEOUS | Status: DC
  Administered 2015-09-22: 5 [IU] via SUBCUTANEOUS
  Administered 2015-09-23 (×2): 3 [IU] via SUBCUTANEOUS
  Administered 2015-09-24: 5 [IU] via SUBCUTANEOUS
  Administered 2015-09-24: 3 [IU] via SUBCUTANEOUS

## 2015-09-22 MED ORDER — BUDESONIDE 0.25 MG/2ML IN SUSP
0.2500 mg | Freq: Two times a day (BID) | RESPIRATORY_TRACT | Status: DC
  Administered 2015-09-22 – 2015-09-24 (×5): 0.25 mg via RESPIRATORY_TRACT
  Filled 2015-09-22 (×5): qty 2

## 2015-09-22 MED ORDER — ASPIRIN 81 MG PO CHEW
81.0000 mg | CHEWABLE_TABLET | Freq: Every day | ORAL | Status: DC
  Administered 2015-09-22 – 2015-09-24 (×3): 81 mg via ORAL
  Filled 2015-09-22 (×3): qty 1

## 2015-09-22 MED ORDER — ASPIRIN 81 MG PO TABS: 81.0000 mg | ORAL_TABLET | Freq: Every day | ORAL | Status: DC

## 2015-09-22 MED ORDER — ISOSORBIDE MONONITRATE ER 60 MG PO TB24
60.0000 mg | ORAL_TABLET | Freq: Every day | ORAL | Status: DC
  Administered 2015-09-22 – 2015-09-24 (×3): 60 mg via ORAL
  Filled 2015-09-22 (×2): qty 2
  Filled 2015-09-22: qty 1

## 2015-09-22 MED ORDER — FUROSEMIDE 40 MG PO TABS
40.0000 mg | ORAL_TABLET | Freq: Every day | ORAL | Status: DC
  Administered 2015-09-22 – 2015-09-24 (×3): 40 mg via ORAL
  Filled 2015-09-22 (×3): qty 1

## 2015-09-22 MED ORDER — INSULIN ASPART 100 UNIT/ML ~~LOC~~ SOLN
0.0000 [IU] | Freq: Every day | SUBCUTANEOUS | Status: DC
Start: 2015-09-22 — End: 2015-09-24
  Administered 2015-09-23: 2 [IU] via SUBCUTANEOUS

## 2015-09-22 MED ORDER — PANTOPRAZOLE SODIUM 40 MG PO TBEC
40.0000 mg | DELAYED_RELEASE_TABLET | Freq: Every day | ORAL | Status: DC
  Administered 2015-09-23 – 2015-09-24 (×2): 40 mg via ORAL
  Filled 2015-09-22 (×2): qty 1

## 2015-09-22 MED ORDER — IRBESARTAN 75 MG PO TABS
75.0000 mg | ORAL_TABLET | Freq: Every day | ORAL | Status: DC
  Administered 2015-09-22 – 2015-09-24 (×3): 75 mg via ORAL
  Filled 2015-09-22 (×4): qty 1

## 2015-09-22 NOTE — Progress Notes (Signed)
Patient reported a "burning" left chest pain, given 2 nitro with relief, ekg done. Patient now denies pain, does describe a feeling of indigestion. MD on call was made aware.  Will continue to monitor patient.

## 2015-09-22 NOTE — Care Management Note (Signed)
Case Management Note  Patient Details  Name: Sean Figueroa MRN: [id number] Date of Birth: February 27, 1933  Subjective/Objective:     Pt lives with son, has home O2 through [employer].  States he has Therapist, sports and PT through [place] but they have not provided services since April per [facility] liaison and PCP's office.                            Expected Discharge Plan:  Home/Self Care  Discharge planning Services  CM Consult  DME Arranged:    DME Agency:  [facility]  Status of Service:  In process, will continue to follow  Girard Cooter, RN 09/22/2015, 3:20 PM

## 2015-09-22 NOTE — Progress Notes (Signed)
[facility] PROGRESS NOTE  Sean Figueroa VOZ:[id number] DOB: Feb 04, 1933 DOA: 09/21/2015 PCP: [name], MD  Assessment/Plan:  #1. Acute on chronic resp failure with Hypercapnia: Likely related to COPD exacerbation. Admit to step down. Upon presentation he was in respiratory distress ABGs showed CO2 of 87. Placed on BiPAP and respiratory effort improved.  -Will continue nebulizers, antibiotics and steroids for COPD as mentioned below -per PCCM rec's will use BIPAP QHS schedule for 48 hours -continue 3L O2 supplementation during the day -follow clinical response -started on flutter valve   #2 COPD exacerbation. Patient reports flu shot and pneumonia vaccine last week. Denies any other sick contacts or recent illnesses.  -will continue pulmicort, solumedrol and PRN nebulizer treatment -will continue flutter valve, mucinex, levaquin and PRN oxygen supplementation  #3. Chronic systolic heart failure. Review indicates the EF 45%. Last cardiology visit in May of this year. -compensated and stable -will continue avapro, lasix and b-blocker -daily weights -strict intake and output   #4. CAD. Chart review indicates history of ischemic heart disease status post remote apical myocardial infarction and status post drug eluding stent placement 2013.  -continue ASA and plavix -continue b-blocker, Avapro and Imdur  #5. Hx of PAF: currently in sinus rhythm and with rate controlled -will continue metoprolol -patient on ASA  #6 HTN: stable overall -will continue metoprol, imdur, avapro and lasix   Code Status: Full Family Communication: no family at bedside Disposition Plan: remains in stepdown for another night of BIPAP as instructed by PCCM, continue treatment for COPD exacerbation.   Consultants:  PCCM   Procedures:  None   Antibiotics:  levaquin 11/03  HPI/Subjective: Afebrile, reports improvement in his breathing. Patient is alert, awake and oriented X 3. In the morning  was complaining of chest/epigastric pain (relieved with GI cocktail)   Objective: Filed Vitals:   09/22/15 1558  BP: 144/63  Pulse: 47  Temp: 98.6 F (37 C)  Resp: 34    Intake/Output Summary (Last 24 hours) at 09/22/15 1612 Last data filed at 09/22/15 1300  Gross per 24 hour  Intake    570 ml  Output    275 ml  Net    295 ml   Filed Weights   09/21/15 1027 09/22/15 0500  Weight: 79.379 kg (175 lb) 76.023 kg (167 lb 9.6 oz)    Exam:   General:  Afebrile, no CP (even he endorses some pain in am, relived with GI cocktail); breathing improving and patient was AAOX3.  Cardiovascular: S1 and S2, no rubs or gallops; no JVD  Respiratory: end exp wheezing, scattered rhonchi, fair air movement   Abdomen: soft, NT, ND, positive BS  Musculoskeletal: no edema or cyanosis    Data Reviewed: Basic Metabolic Panel:  Recent Labs Lab 09/21/15 1108 09/21/15 1430 09/22/15 0501  NA 139  --  137  K 4.9  --  4.2  CL 98*  --  97*  CO2 36*  --  33*  GLUCOSE 134*  --  197*  BUN 14  --  16  CREATININE 1.03 0.98 1.12  CALCIUM 8.9  --  9.0   CBC:  Recent Labs Lab 09/21/15 1108 09/21/15 1430 09/22/15 0501  WBC 11.2* 9.1 12.1*  HGB 12.9* 13.1 12.9*  HCT 39.8 40.6 39.3  MCV 103.9* 103.8* 103.1*  PLT 198 218 211   Cardiac Enzymes:  Recent Labs Lab 09/21/15 1108 09/21/15 1430  TROPONINI <0.03 <0.03   BNP (last 3 results)  Recent Labs  09/21/15 1108  BNP 114.0*    ProBNP (last 3 results)  Recent Labs  10/06/14 1000  PROBNP 1522.0*    CBG: No results for input(s): GLUCAP in the last 168 hours.  Recent Results (from the past 240 hour(s))  MRSA PCR Screening     Status: None   Collection Time: 09/21/15  3:43 PM  Result Value Ref Range Status   MRSA by PCR NEGATIVE NEGATIVE Final    Comment:        The GeneXpert MRSA Assay (FDA approved for NASAL specimens only), is one component of a comprehensive MRSA colonization surveillance program. It is  not intended to diagnose MRSA infection nor to guide or monitor treatment for MRSA infections.      Studies: Dg Chest Portable 1 View  09/21/2015  CLINICAL DATA:  Short of breath and chest pain EXAM: PORTABLE CHEST 1 VIEW COMPARISON:  10/06/2014 FINDINGS: Mild hyperinflation with findings consistent with COPD. Density in the left lung base has improved in the interval. Some of this may be chronic lingular atelectasis. This was present on a CT in 2014. Mild pleural thickening or effusion. Right lung clear Negative for heart failure IMPRESSION: Left lower lobe pleural and parenchymal density has improved since the prior study. This is likely related to chronic scarring based on prior CT. No superimposed acute abnormality. Electronically Signed   By: Franchot Gallo M.D.   On: 09/21/2015 11:08    Scheduled Meds: . antiseptic oral rinse  7 mL Mouth Rinse BID  . budesonide (PULMICORT) nebulizer solution  0.25 mg Nebulization BID  . clopidogrel  75 mg Oral Q breakfast  . enoxaparin (LOVENOX) injection  40 mg Subcutaneous Q24H  . insulin aspart  0-15 Units Subcutaneous TID WC  . insulin aspart  0-5 Units Subcutaneous QHS  . ipratropium  0.5 mg Nebulization Q6H  . levalbuterol  0.63 mg Nebulization Q6H  . levofloxacin (LEVAQUIN) IV  500 mg Intravenous Q24H  . methylPREDNISolone (SOLU-MEDROL) injection  60 mg Intravenous 3 times per day  . sodium bicarbonate  650 mg Oral Daily   Continuous Infusions:   Principal Problem:   Hypercapnic respiratory failure (HCC) Active Problems:   COPD (chronic obstructive pulmonary disease) (HCC)   Tobacco abuse   CAD (coronary artery disease)   PAF (paroxysmal atrial fibrillation) (HCC)   COPD, severe (HCC)   Chronic systolic heart failure ([place])   Acute on chronic respiratory failure with hypoxia and hypercapnia (HCC)   Acute on chronic respiratory failure with hypercapnia ([place])    Time spent: 35 minutes    Barton Dubois  [facility] Pager [phone]. If 7PM-7AM, please contact night-coverage at [email or web address], password [facility] 09/22/2015, 4:12 PM  LOS: 1 day

## 2015-09-22 NOTE — Progress Notes (Signed)
Patient is resting comfortably on 3L nasal cannula, RR  -15 SpO2 - 95% HR 94. Bipap not placed on at this time. RT will continue to monitor throughout the night.

## 2015-09-22 NOTE — Evaluation (Signed)
Physical Therapy Evaluation Patient Details Name: Sean Figueroa MRN: [id number] DOB: 12/05/1932 Today's Date: 09/22/2015   History of Present Illness  ROMUALD MCCASLIN is a 81 y.o. male with a Past Medical History of CAD, HLD, HTN, A. fib, systolic CHF, MI, COPD oxygen dependent who presents with hypercapnic respiratory failure secondary to COPD exacerbation.  Clinical Impression  Pt admitted with above diagnosis. Pt currently with functional limitations due to the deficits listed below (see PT Problem List). Pt able to ambulate a short distance but limited by HR incr.  Hr from 104-130 bpm with pt winded as well with sats 86-90% on 3LO2.  Took incr time for sat to return to 95% on 3LO2.  BP 138/59 at rest, 118/56 in ssitting and 143/76 once in chair.  Pt with decr endurance but still feels he and son can manage at home with [facility] f/u.    Pt will benefit from skilled PT to increase their independence and safety with mobility to allow discharge to the venue listed below.      Follow Up Recommendations Supervision/Assistance - 24 hour;Home health PT (HHOT)    Equipment Recommendations  None recommended by PT    Recommendations for Other Services       Precautions / Restrictions Precautions Precautions: Fall Restrictions Weight Bearing Restrictions: No      Mobility  Bed Mobility Overal bed mobility: Needs Assistance Bed Mobility: Supine to Sit     Supine to sit: Min assist     General bed mobility comments: pt needed assist to elevate trunk  Transfers Overall transfer level: Needs assistance Equipment used: Rolling walker (2 wheeled) Transfers: Sit to/from Stand Sit to Stand: Min assist;+2 physical assistance         General transfer comment: Pt needed assist to power up as well as steadying assist once up.  Ambulation/Gait Ambulation/Gait assistance: Min assist;+2 safety/equipment Ambulation Distance (Feet): 8 Feet Assistive device: Rolling walker (2 wheeled) Gait  Pattern/deviations: Step-through pattern;Decreased stride length;Staggering left;Staggering right   Gait velocity interpretation: Below normal speed for age/gender General Gait Details: Pt ambulated with RW with cues for sequencing steps and RW as well as cues to stay close to rW.  Pt needed assist to steer RW.  Pt with generalized unsteady gait.HR incr therefore did not ambulate very far.  Stairs            Wheelchair Mobility    Modified Rankin (Stroke Patients Only)       Balance Overall balance assessment: Needs assistance;History of Falls         Standing balance support: Bilateral upper extremity supported;During functional activity Standing balance-Leahy Scale: Poor Standing balance comment: requires RW for support.                             Pertinent Vitals/Pain Pain Assessment: No/denies pain  See above    Home Living Family/patient expects to be discharged to:: Private residence Living Arrangements: Children Available Help at Discharge: Family;Friend(s);Available 24 hours/day (son handicapped but can assist pt, neighbor Jenny Reichmann A too) Type of Home: House Home Access: Stairs to enter Entrance Stairs-Rails: Right Entrance Stairs-Number of Steps: 3 Home Layout: One level Home Equipment: Cane - single point;Walker - 2 wheels      Prior Function Level of Independence: Independent with assistive device(s)         Comments: pt states he used cane and was I with ADLs     Hand Dominance  Extremity/Trunk Assessment   Upper Extremity Assessment: Defer to OT evaluation           Lower Extremity Assessment: Generalized weakness         Communication   Communication: No difficulties  Cognition Arousal/Alertness: Awake/alert Behavior During Therapy: WFL for tasks assessed/performed Overall Cognitive Status: Within Functional Limits for tasks assessed                      General Comments General comments (skin  integrity, edema, etc.): Son and neighbor, Jenny Reichmann came in and they feel that pt will do best at home.  Son states he can help his dad and Jenny Reichmann agrees with this.      Exercises        Assessment/Plan    PT Assessment Patient needs continued PT services  PT Diagnosis Generalized weakness   PT Problem List Decreased activity tolerance;Decreased balance;Decreased mobility;Decreased knowledge of use of DME;Decreased safety awareness;Decreased knowledge of precautions;Cardiopulmonary status limiting activity  PT Treatment Interventions DME instruction;Gait training;Functional mobility training;Therapeutic activities;Therapeutic exercise;Balance training;Stair training;Patient/family education   PT Goals (Current goals can be found in the Care Plan section) Acute Rehab PT Goals Patient Stated Goal: to get better PT Goal Formulation: With patient Time For Goal Achievement: 10/06/15 Potential to Achieve Goals: Good    Frequency Min 3X/week   Barriers to discharge        Co-evaluation               End of Session Equipment Utilized During Treatment: Gait belt;Oxygen Activity Tolerance: Patient limited by fatigue Patient left: in chair;with call bell/phone within reach;with chair alarm set;with family/visitor present Nurse Communication: Mobility status         Time: 2458-0998 PT Time Calculation (min) (ACUTE ONLY): 23 min   Charges:   PT Evaluation $Initial PT Evaluation Tier I: 1 Procedure PT Treatments $Gait Training: 8-22 mins   PT G CodesDenice Paradise 10-09-2015, 1:58 PM [place] Seriyah Collison,PT Acute Rehabilitation [phone] (pager)

## 2015-09-22 NOTE — Progress Notes (Signed)
Initial Nutrition Assessment  DOCUMENTATION CODES:   Not applicable  INTERVENTION:   Ensure Enlive po BID, each supplement provides 350 kcal and 20 grams of protein  NUTRITION DIAGNOSIS:   Increased nutrient needs related to chronic illness as evidenced by estimated needs  GOAL:   Patient will meet greater than or equal to 90% of their needs  MONITOR:   PO intake, Supplement acceptance, Labs, Weight trends, I & O's  REASON FOR ASSESSMENT:   Consult COPD Protocol  ASSESSMENT:   81 y.o. male with PMH of CAD, HLD, HTN, A. fib, systolic CHF, MI, COPD oxygen dependent who presented with hypercapnic respiratory failure secondary to COPD exacerbation.  Patient reports a good appetite.  Ate his breakfast well.  Nutrient needs increased given COPD.  Pt loves Ensure and/or Boost supplements.  RD to order.  Nutrition focused physical exam completed.  No muscle or subcutaneous fat depletion noticed.  Diet Order:  Diet Heart Room service appropriate?: Yes; Fluid consistency:: Thin  Skin:  Reviewed, no issues  Last BM:  N/A  Height:   Ht Readings from Last 1 Encounters:  09/21/15 5\' 10"  (1.778 m)    Weight:   Wt Readings from Last 1 Encounters:  09/22/15 167 lb 9.6 oz (76.023 kg)    Ideal Body Weight:  75.4 kg  BMI:  Body mass index is 24.05 kg/(m^2).  Estimated Nutritional Needs:   Kcal:  1600-1800  Protein:  75-85 gm  Fluid:  1.6-1.8 L  EDUCATION NEEDS:   No education needs identified at this time  Arthur Holms, RD, LDN Pager #: [phone] After-Hours Pager #: [phone]

## 2015-09-22 NOTE — Progress Notes (Signed)
Placed BiPAP on patient for the night. He is tolerating it well and RT will continue to monitor.

## 2015-09-22 NOTE — Progress Notes (Signed)
The patient has not voided during the night.  He has ">200 ml" urine per bladder scanner; he denies discomfort. On call has been notified. Will continue to monitor.

## 2015-09-22 NOTE — Progress Notes (Signed)
Occupational Therapy Evaluation Patient Details Name: Sean Figueroa MRN: [id number] DOB: 10-May-1933 Today's Date: 09/22/2015    History of Present Illness Sean Figueroa is a 81 y.o. male with a Past Medical History of CAD, HLD, HTN, A. fib, systolic CHF, MI, COPD oxygen dependent who presents with hypercapnic respiratory failure secondary to COPD exacerbation.   Clinical Impression   PTA, pt lived with developmentally delayed son and was mod i with mobility and ADL. Has "meals on wheels" and a neighbor who assists as he can. Pt ambulated @12  feet before desat into high 70s on 4L; HR 33. Dyspnea 3/4. After sitting, O2 returned to 93 3L. Pt will benefit from [facility] and [place]. Will follow acutely. Will assess use of rollator with pt during activities on next visit.      Follow Up Recommendations  Home health OT;[place]; Supervision - Intermittent    Equipment Recommendations  3 in 1 bedside comode  Rollator  Recommendations for Other Services       Precautions / Restrictions Precautions Precautions: Fall Restrictions Weight Bearing Restrictions: No      Mobility Bed Mobility               General bed mobility comments: OOB in chair  Transfers Overall transfer level: Needs assistance Equipment used: Rolling walker (2 wheeled)   Sit to Stand: Min assist         General transfer comment: steady assist only    Balance             Standing balance-Leahy Scale: Poor Standing balance comment: reports frequent falls.                            ADL Overall ADL's : Needs assistance/impaired Eating/Feeding: Modified independent Eating/Feeding Details (indicate cue type and reason): nsg reports desat to 86 while eating Grooming: Set up;Sitting   Upper Body Bathing: Minimal assitance;Sitting   Lower Body Bathing: Moderate assistance;Sit to/from stand   Upper Body Dressing : Minimal assistance;Sitting   Lower Body Dressing: Moderate  assistance;Sit to/from stand   Toilet Transfer: Minimal assistance;RW   Toileting- Clothing Manipulation and Hygiene: Minimal assistance;Sit to/from stand       Functional mobility during ADLs: Minimal assistance;Rolling walker General ADL Comments: Pt sats ADL have become more difficult due to exertion/SOB. Pt with 3/4 dypsnea after ambulating @15  ft with desat to 77. Pt given enrgy conservation sheet - began education. Pt states he uses a "red nebulizer" before he takes showers. States he is in the process of having his bathroom remodeled. Would benefit from shower chair if it will be awhile until he gets bathroom remodeled     Vision     Perception     Praxis      Pertinent Vitals/Pain Pain Assessment: No/denies pain; desat to 77 4L with activity; HR 2 120; RR 33     Hand Dominance Right   Extremity/Trunk Assessment Upper Extremity Assessment Upper Extremity Assessment: Generalized weakness   Lower Extremity Assessment Lower Extremity Assessment: Generalized weakness   Cervical / Trunk Assessment Cervical / Trunk Assessment: Normal   Communication Communication Communication: No difficulties   Cognition Arousal/Alertness: Awake/alert Behavior During Therapy: WFL for tasks assessed/performed Overall Cognitive Status: Within Functional Limits for tasks assessed                     General Comments       Exercises  Shoulder Instructions      Home Living Family/patient expects to be discharged to:: Private residence Living Arrangements: Children Available Help at Discharge: Family;Friend(s);Available 24 hours/day (son handicapped but can assist pt, neighbor Jenny Reichmann A too) Type of Home: House Home Access: Stairs to enter [employer] of Steps: 3 Entrance Stairs-Rails: Right Home Layout: One level     Bathroom Shower/Tub: Other (comment);Tub/shower unit (pt sponge bathes)   Bathroom Toilet: Standard Bathroom Accessibility: Yes How  Accessible: Accessible via walker Home Equipment: [place] - single point;Walker - 2 wheels   Additional Comments: Currently having bathroom remodeled      Prior Functioning/Environment Level of Independence: Independent with assistive device(s)        Comments: pt states he used cane and was I with ADLs; has meals on wheels; neighbor helps with his son and as much as possible around the house    OT Diagnosis: Generalized weakness   OT Problem List: Decreased strength;Decreased activity tolerance;Impaired balance (sitting and/or standing);Decreased knowledge of use of DME or AE;Cardiopulmonary status limiting activity   OT Treatment/Interventions: Self-care/ADL training;Therapeutic exercise;Energy conservation;DME and/or AE instruction;Therapeutic activities;Patient/family education;Balance training    OT Goals(Current goals can be found in the care plan section) Acute Rehab OT Goals Patient Stated Goal: to get better OT Goal Formulation: With patient Time For Goal Achievement: 10/06/15 Potential to Achieve Goals: Good ADL Goals Pt Will Perform Lower Body Bathing: with set-up;with supervision;with adaptive equipment;sit to/from stand Pt Will Perform Lower Body Dressing: with set-up;with supervision;with adaptive equipment;sit to/from stand Pt Will Transfer to Toilet: with supervision;bedside commode;ambulating (using pursed lip breathing technique) Pt Will Perform Toileting - Clothing Manipulation and hygiene: with supervision;sit to/from stand Additional ADL Goal #1: Pt will verbalize implementation of 3 energy conservation techniques for ADL  OT Frequency: Min 2X/week   Barriers to D/C: Decreased caregiver support          Co-evaluation              End of Session Equipment Utilized During Treatment: Gait belt;Rolling walker;Oxygen (4L) Nurse Communication: Mobility status;Other (comment) (desat)  Activity Tolerance: Other (comment) (limited by O2 desat) Patient left:  in chair;with call bell/phone within reach   Time: 1645-1705 OT Time Calculation (min): 20 min Charges:  OT General Charges $OT Visit: 1 Procedure OT Evaluation $Initial OT Evaluation Tier I: 1 Procedure G-Codes:    Cleota Pellerito,HILLARY 10/04/15, 5:27 PM   [facility], OTR/L  [phone] 04-Oct-2015

## 2015-09-23 DIAGNOSIS — J441 Chronic obstructive pulmonary disease with (acute) exacerbation: Secondary | ICD-10-CM | POA: Insufficient documentation

## 2015-09-23 DIAGNOSIS — K219 Gastro-esophageal reflux disease without esophagitis: Secondary | ICD-10-CM

## 2015-09-23 DIAGNOSIS — J9602 Acute respiratory failure with hypercapnia: Secondary | ICD-10-CM

## 2015-09-23 LAB — CBC
HEMATOCRIT: 38.2 % — AB (ref 39.0–52.0)
HEMOGLOBIN: 12.6 g/dL — AB (ref 13.0–17.0)
MCH: 33.3 pg (ref 26.0–34.0)
MCHC: 33 g/dL (ref 30.0–36.0)
MCV: 101.1 fL — AB (ref 78.0–100.0)
Platelets: 233 10*3/uL (ref 150–400)
RBC: 3.78 MIL/uL — ABNORMAL LOW (ref 4.22–5.81)
RDW: 14.2 % (ref 11.5–15.5)
WBC: 19.9 10*3/uL — ABNORMAL HIGH (ref 4.0–10.5)

## 2015-09-23 LAB — BASIC METABOLIC PANEL
Anion gap: 8 (ref 5–15)
BUN: 33 mg/dL — AB (ref 6–20)
CALCIUM: 8.8 mg/dL — AB (ref 8.9–10.3)
CHLORIDE: 97 mmol/L — AB (ref 101–111)
CO2: 33 mmol/L — AB (ref 22–32)
CREATININE: 1.13 mg/dL (ref 0.61–1.24)
GFR calc Af Amer: >60 mL/min (ref >60)
GFR calc non Af Amer: 59 mL/min — ABNORMAL LOW (ref >60)
GLUCOSE: 193 mg/dL — AB (ref 65–99)
Potassium: 4.4 mmol/L (ref 3.5–5.1)
Sodium: 138 mmol/L (ref 135–145)

## 2015-09-23 LAB — GLUCOSE, CAPILLARY
GLUCOSE-CAPILLARY: 152 mg/dL — AB (ref 65–99)
Glucose-Capillary: 184 mg/dL — ABNORMAL HIGH (ref 65–99)
Glucose-Capillary: 112 mg/dL — ABNORMAL HIGH (ref 65–99)
Glucose-Capillary: 228 mg/dL — ABNORMAL HIGH (ref 65–99)

## 2015-09-23 MED ORDER — METHYLPREDNISOLONE SODIUM SUCC 125 MG IJ SOLR
60.0000 mg | Freq: Two times a day (BID) | INTRAMUSCULAR | Status: DC
  Administered 2015-09-23 – 2015-09-24 (×2): 60 mg via INTRAVENOUS
  Filled 2015-09-23 (×2): qty 2

## 2015-09-23 MED ORDER — LEVOFLOXACIN 500 MG PO TABS
500.0000 mg | ORAL_TABLET | Freq: Every day | ORAL | Status: DC
  Administered 2015-09-23 – 2015-09-24 (×2): 500 mg via ORAL
  Filled 2015-09-23 (×4): qty 1

## 2015-09-23 NOTE — Progress Notes (Signed)
Report called and received from Joycelyn Schmid, [place] on [facility].

## 2015-09-23 NOTE — Progress Notes (Signed)
NURSING PROGRESS NOTE  Sean Figueroa [id number] Transfer Data: 09/23/2015 2:20 PM Attending Provider: Barton Dubois, MD IWL:[id number],HER, MD Code Status: Full  Sean Figueroa is a 81 y.o. male patient transferred from [place]  -No acute distress noted.  -No complaints of shortness of breath.  -No complaints of chest pain.   Last Documented Vital Signs: Blood pressure 132/76, pulse 68, temperature 97.9 F (36.6 C), temperature source Oral, resp. rate 19, height 5\' 10"  (1.778 m), weight 76.794 kg (169 lb 4.8 oz), SpO2 97 %.  IV Fluids:  IV in place, occlusive dsg intact without redness, IV cath forearm left, condition patent and no redness IV push only, no IV fluids.   Allergies:  Indomethacin; Lipitor; Pravachol; Procardia; and Zaroxolyn  Past Medical History:   has a past medical history of Coronary artery disease; Hyperlipidemia; Hypertension; Long-term (current) use of anticoagulants; LVH (left ventricular hypertrophy); Atrial fibrillation ([place]); Tobacco abuse; Systolic heart failure; Myocardial infarction ([place]) (1981); Left ventricular apical thrombus ([place]); On home oxygen therapy; COPD (chronic obstructive pulmonary disease) ([place]); Pneumonia ("several times"); Arthritis; History of gout; Chronic back pain; Nocturia; and Syncopal episodes.  Past Surgical History:   has past surgical history that includes Cholecystectomy; Hernia repair; Coronary angioplasty with stent (12/2011); Cardiac catheterization (1981); left heart catheterization with coronary angiogram (N/A, 01/16/2012); and percutaneous coronary stent intervention (pci-s) (N/A, 01/20/2012).  Social History:   reports that he has been smoking Cigarettes.  He has a 48.75 pack-year smoking history. He has never used smokeless tobacco. He reports that he drinks alcohol. He reports that he does not use illicit drugs.  Skin: Abrasions/redness to shins, bilateral  Patient/Family orientated to room. Information packet given to patient/family.  Admission inpatient armband information verified with patient/family to include name and date of birth and placed on patient arm. Side rails up x 2, fall assessment and education completed with patient/family. Patient/family able to verbalize understanding of risk associated with falls and verbalized understanding to call for assistance before getting out of bed. Call light within reach. Patient/family able to voice and demonstrate understanding of unit orientation instructions.

## 2015-09-23 NOTE — Progress Notes (Signed)
Upon entering pts room pt requested to have BIPAP removed.  RN removed BIPAP and replaced pts [place] @ 4L.  Sats are 96%, pt is sitting up in bed and resting comfortably.  RN will continue to monitor.

## 2015-09-23 NOTE — Progress Notes (Signed)
[facility] PROGRESS NOTE  RUSTIN ERHART URK:[id number] DOB: November 07, 1933 DOA: 09/21/2015 PCP: [name], MD  Assessment/Plan:  #1. Acute on chronic resp failure with Hypercapnia: Likely related to COPD exacerbation. Admit to step down. Upon presentation he was in respiratory distress ABGs showed CO2 of 87.after BIPAP use CO2 down to 58 and breathing significantly improved. -Will continue nebulizers, antibiotics and steroids as mentioned below -per PCCM rec's has used bipap for 48 hours now; breathing significantly improved and last CO2 in the 58 range. Will move to med-surg bed and observe for another 24 hours while continue tx for COPD exacerbation. -continue 3L O2 supplementation  -follow clinical response -continue [place] of flutter valve   #2 COPD exacerbation. Patient reports flu shot and pneumonia vaccine last week. Denies any other sick contacts or recent illnesses.  -will continue pulmicort, solumedrol and PRN nebulizer treatment -will continue flutter valve, mucinex, levaquin and oxygen supplementation -will switch abx's to PO and will start steroids tapering   #3. Chronic systolic heart failure. Review indicates the EF 45%. Last cardiology visit in May of this year. -compensated and stable currently -will continue avapro, lasix and b-blocker -continue daily weights and strict intake and output   #4. CAD. Chart review indicates history of ischemic heart disease status post remote apical myocardial infarction and status post drug eluding stent placement 2013.  -continue ASA and plavix -continue b-blocker, Avapro and Imdur  #5. Hx of PAF: currently in sinus rhythm and with rate controlled -will continue metoprolol -patient on ASA -CHADsVASC 3  #6 HTN: stable overall -will continue metoprol, imdur, avapro and lasix  #7hyperglycemia: due to steroids most likely -no prior hx of diabetes -will continue SSI -follow CBG's as steroids are tapered off -will check A1C   Code  Status: Full Family Communication: no family at bedside Disposition Plan: remains in stepdown for another night of BIPAP as instructed by PCCM, continue treatment for COPD exacerbation.   Consultants:  PCCM   Procedures:  None   Antibiotics:  levaquin 11/03  HPI/Subjective: Afebrile, no CP, no nausea or vomiting. Tolerated use of BIPAP overnight. Breathing a lot better and in no distress currently.  Objective: Filed Vitals:   09/23/15 0800  BP: 135/84  Pulse: 84  Temp: 97.5 F (36.4 C)  Resp: 28    Intake/Output Summary (Last 24 hours) at 09/23/15 0853 Last data filed at 09/22/15 2345  Gross per 24 hour  Intake    580 ml  Output   1025 ml  Net   -445 ml   Filed Weights   09/21/15 1027 09/22/15 0500 09/23/15 0500  Weight: 79.379 kg (175 lb) 76.023 kg (167 lb 9.6 oz) 76.794 kg (169 lb 4.8 oz)    Exam:   General:  Afebrile, no CP, no nausea, no vomiting and reporting he is breathing a lot better  Cardiovascular: S1 and S2, no rubs or gallops; no JVD  Respiratory: mild exp wheezing, scattered rhonchi, improved air movement   Abdomen: soft, NT, ND, positive BS  Musculoskeletal: no edema or cyanosis    Data Reviewed: Basic Metabolic Panel:  Recent Labs Lab 09/21/15 1108 09/21/15 1430 09/22/15 0501 09/23/15 0527  NA 139  --  137 138  K 4.9  --  4.2 4.4  CL 98*  --  97* 97*  CO2 36*  --  33* 33*  GLUCOSE 134*  --  197* 193*  BUN 14  --  16 33*  CREATININE 1.03 0.98 1.12 1.13  CALCIUM 8.9  --  9.0 8.8*   CBC:  Recent Labs Lab 09/21/15 1108 09/21/15 1430 09/22/15 0501 09/23/15 0527  WBC 11.2* 9.1 12.1* 19.9*  HGB 12.9* 13.1 12.9* 12.6*  HCT 39.8 40.6 39.3 38.2*  MCV 103.9* 103.8* 103.1* 101.1*  PLT 198 218 211 233   Cardiac Enzymes:  Recent Labs Lab 09/21/15 1108 09/21/15 1430  TROPONINI <0.03 <0.03   BNP (last 3 results)  Recent Labs  09/21/15 1108  BNP 114.0*    ProBNP (last 3 results)  Recent Labs  10/06/14 1000   PROBNP 1522.0*    CBG:  Recent Labs Lab 09/22/15 1628 09/22/15 2237 09/23/15 0754  GLUCAP 248* 151* 184*    Recent Results (from the past 240 hour(s))  MRSA PCR Screening     Status: None   Collection Time: 09/21/15  3:43 PM  Result Value Ref Range Status   MRSA by PCR NEGATIVE NEGATIVE Final    Comment:        The GeneXpert MRSA Assay (FDA approved for NASAL specimens only), is one component of a comprehensive MRSA colonization surveillance program. It is not intended to diagnose MRSA infection nor to guide or monitor treatment for MRSA infections.      Studies: Dg Chest Portable 1 View  09/21/2015  CLINICAL DATA:  Short of breath and chest pain EXAM: PORTABLE CHEST 1 VIEW COMPARISON:  10/06/2014 FINDINGS: Mild hyperinflation with findings consistent with COPD. Density in the left lung base has improved in the interval. Some of this may be chronic lingular atelectasis. This was present on a CT in 2014. Mild pleural thickening or effusion. Right lung clear Negative for heart failure IMPRESSION: Left lower lobe pleural and parenchymal density has improved since the prior study. This is likely related to chronic scarring based on prior CT. No superimposed acute abnormality. Electronically Signed   By: Franchot Gallo M.D.   On: 09/21/2015 11:08    Scheduled Meds: . antiseptic oral rinse  7 mL Mouth Rinse BID  . aspirin  81 mg Oral Daily  . budesonide (PULMICORT) nebulizer solution  0.25 mg Nebulization BID  . clopidogrel  75 mg Oral Q breakfast  . enoxaparin (LOVENOX) injection  40 mg Subcutaneous Q24H  . furosemide  40 mg Oral Daily  . insulin aspart  0-15 Units Subcutaneous TID WC  . insulin aspart  0-5 Units Subcutaneous QHS  . ipratropium  0.5 mg Nebulization Q6H  . irbesartan  75 mg Oral Daily  . isosorbide mononitrate  60 mg Oral Daily  . levalbuterol  0.63 mg Nebulization Q6H  . levofloxacin  500 mg Oral Daily  . methylPREDNISolone (SOLU-MEDROL) injection  60  mg Intravenous Q12H  . metoprolol tartrate  25 mg Oral BID  . pantoprazole  40 mg Oral Q1200   Continuous Infusions:   Principal Problem:   Hypercapnic respiratory failure (HCC) Active Problems:   COPD (chronic obstructive pulmonary disease) (HCC)   Tobacco abuse   CAD (coronary artery disease)   PAF (paroxysmal atrial fibrillation) (HCC)   COPD, severe (HCC)   Chronic systolic heart failure ([place])   Acute on chronic respiratory failure with hypoxia and hypercapnia (HCC)   Acute on chronic respiratory failure with hypercapnia ([place])    Time spent: 35 minutes    Barton Dubois  [facility] Pager [phone]. If 7PM-7AM, please contact night-coverage at [email or web address], password [facility] 09/23/2015, 8:53 AM  LOS: 2 days

## 2015-09-24 DIAGNOSIS — I119 Hypertensive heart disease without heart failure: Secondary | ICD-10-CM

## 2015-09-24 LAB — BASIC METABOLIC PANEL
Anion gap: 10 (ref 5–15)
BUN: 42 mg/dL — ABNORMAL HIGH (ref 6–20)
CALCIUM: 8.6 mg/dL — AB (ref 8.9–10.3)
CO2: 34 mmol/L — ABNORMAL HIGH (ref 22–32)
CREATININE: 1.19 mg/dL (ref 0.61–1.24)
Chloride: 95 mmol/L — ABNORMAL LOW (ref 101–111)
GFR, EST NON AFRICAN AMERICAN: 56 mL/min — AB (ref >60)
Glucose, Bld: 172 mg/dL — ABNORMAL HIGH (ref 65–99)
Potassium: 4 mmol/L (ref 3.5–5.1)
SODIUM: 139 mmol/L (ref 135–145)

## 2015-09-24 LAB — CBC
HCT: 38.6 % — ABNORMAL LOW (ref 39.0–52.0)
Hemoglobin: 12.7 g/dL — ABNORMAL LOW (ref 13.0–17.0)
MCH: 33.2 pg (ref 26.0–34.0)
MCHC: 32.9 g/dL (ref 30.0–36.0)
MCV: 100.8 fL — ABNORMAL HIGH (ref 78.0–100.0)
Platelets: 211 10*3/uL (ref 150–400)
RBC: 3.83 MIL/uL — ABNORMAL LOW (ref 4.22–5.81)
RDW: 14.4 % (ref 11.5–15.5)
WBC: 15.7 10*3/uL — ABNORMAL HIGH (ref 4.0–10.5)

## 2015-09-24 LAB — GLUCOSE, CAPILLARY
Glucose-Capillary: 157 mg/dL — ABNORMAL HIGH (ref 65–99)
Glucose-Capillary: 212 mg/dL — ABNORMAL HIGH (ref 65–99)

## 2015-09-24 MED ORDER — PREDNISONE 20 MG PO TABS: ORAL_TABLET | ORAL | Status: DC

## 2015-09-24 MED ORDER — IPRATROPIUM BROMIDE 0.02 % IN SOLN: 0.5000 mg | Freq: Two times a day (BID) | RESPIRATORY_TRACT | Status: DC

## 2015-09-24 MED ORDER — LEVOFLOXACIN 500 MG PO TABS: 500.0000 mg | ORAL_TABLET | Freq: Every day | ORAL | Status: DC

## 2015-09-24 MED ORDER — FUROSEMIDE 40 MG PO TABS: 40.0000 mg | ORAL_TABLET | Freq: Two times a day (BID) | ORAL | Status: DC

## 2015-09-24 MED ORDER — LEVALBUTEROL HCL 0.63 MG/3ML IN NEBU: 0.6300 mg | INHALATION_SOLUTION | Freq: Two times a day (BID) | RESPIRATORY_TRACT | Status: DC

## 2015-09-24 MED ORDER — PANTOPRAZOLE SODIUM 40 MG PO TBEC: 40.0000 mg | DELAYED_RELEASE_TABLET | Freq: Every day | ORAL | Status: DC

## 2015-09-24 NOTE — Discharge Summary (Signed)
Physician Discharge Summary  Sean Figueroa TDV:[id number] DOB: September 23, 1933 DOA: 09/21/2015  PCP: [name], MD  Admit date: 09/21/2015 Discharge date: 09/24/2015  Time spent: 40 minutes  Recommendations for Outpatient Follow-up:  1. Repeat CBC to follow WBC's trend 2. Please check BMET to follow electrolytes and renal function 3. Follow A1C and if elevated initiate treatment for diabetes; patient with elevated CBG's during hospitalization, but was on steroids  Discharge Diagnoses:  Principal Problem:   Hypercapnic respiratory failure ([place]), Acute on Chronic Active Problems:   COPD (chronic obstructive pulmonary disease) (HCC)   Tobacco abuse   CAD (coronary artery disease)   PAF (paroxysmal atrial fibrillation) (HCC)   COPD, severe (HCC)   Chronic systolic heart failure (HCC)   Acute on chronic respiratory failure with hypoxia and hypercapnia (HCC)   Acute on chronic respiratory failure with hypercapnia (HCC)   COPD exacerbation (HCC)   Acute respiratory failure with hypercapnia (HCC)   Esophageal reflux   Discharge Condition: stable and improved. Discharge home with outpatient follow up by PCCM and PCP.  Diet recommendation: heart healthy/low sodium and low carb diet   Filed Weights   09/22/15 0500 09/23/15 0500 09/24/15 0552  Weight: 76.023 kg (167 lb 9.6 oz) 76.794 kg (169 lb 4.8 oz) 74.2 kg (163 lb 9.3 oz)    History of present illness:  81 y.o. male with a past medical history that includes COPD on 3 L of oxygen at home, continued tobacco use, CAD, chronic respiratory failure, chronic systolic heart failure, PAF, presents to emergency department with the chief complaint of worsening shortness of breath. Initial evaluation reveals acute on chronic respiratory failure related to COPD exacerbation.  Patient reports last week he went to his primary care provider and got the flu shot and the pneumonia vaccine. Shortly thereafter he developed gradual worsening of his shortness  of breath. Associated symptoms include worsening productive cough describing the mucus is clear. He denies any chest pain palpitations fever chills. He denies any abdominal pain nausea vomiting diarrhea constipation. He denies headache visual disturbances numbness or tingling of his extremities. He took home nebs with little improvement. He called EMS who provided him with a nebulizer treatment and he got minimal relief. Transported to the emergency department.  Workup in the emergency department includes chest x-ray revealing left lower lobe pleural and parenchymal density has improved since the prior study. This is likely related to chronic scarring based on prior CT. No superimposed acute abnormality, [place] blood gas with a pH of 7.25, PCO2 86.6, PO2 311, bicarbonate 38.8, lactic acid 0.62, basic metabolic panel with chloride of 98 CO2 36 serum glucose 134.  Hospital Course:  #1. Acute on chronic resp failure with Hypercapnia: Likely related to COPD exacerbation.  -Will continue nebulizers, antibiotics and steroids as mentioned below -per PCCM rec's he used bipap for 48 hours; breathing significantly improved and last CO2 in the 58 range.  -continue 3L O2 supplementation  -continue use of flutter valve  -continue treatment for COPD exacerbation -outpatient follow up with pulmonary service in order to decide further treatment and to arrange for sleep study if needed (patient asking if he needs BIPAP or CPAP at night)   #2 COPD exacerbation. Patient reports flu shot and pneumonia vaccine one week PTA. Denies any sick contacts or recent illnesses.  -will continue home inhaler and nebulizer regimen -will discharge on tapering steroids and levaquin  -will continue flutter valve, mucinex and oxygen supplementation  #3. Chronic systolic heart failure. Review indicates the  EF 45%. Last cardiology visit in May of this year. -compensated and stable during this hospitalization  -will continue avapro,  lasix and b-blocker -continue daily weights and low sodium diet -outpatient follow up with cardiology service as previously scheduled   #4. CAD. Chart review indicates history of ischemic heart disease status post remote apical myocardial infarction and status post drug eluding stent placement 2013.  -continue ASA and plavix -continue b-blocker, Avapro and Imdur -no complaints of CP  #5. Hx of PAF: currently in sinus rhythm and with rate controlled -will continue metoprolol for rate controlled -patient is on ASA -CHADsVASC 3  #6 HTN: stable overall -will continue metoprol, imdur, avapro and lasix  #7hyperglycemia: due to steroids most likely -no prior hx of diabetes -encourage to follow low carb diet -follow CBG's as steroids are tapered off -follow A1C  Procedures:  See below for x-ray reports   Consultations:  PCCM  Discharge Exam: Filed Vitals:   09/24/15 0946  BP: 128/73  Pulse: 85  Temp:   Resp:     General: Afebrile, no CP, no nausea, no vomiting and reporting he is breathing back to baseline   Cardiovascular: S1 and S2, no rubs or gallops; no JVD  Respiratory: good air movement, no wheezing, scattered rhonchi, no rales  Abdomen: soft, NT, ND, positive BS  Musculoskeletal: no edema or cyanosis   Discharge Instructions   Discharge Instructions    Diet - low sodium heart healthy    Complete by:  As directed      Discharge instructions    Complete by:  As directed   Take medications as prescribed Keep yourself well hydrated  Follow heart healthy/low sodium diet Arrange follow up with pulmonologist after discharge ([facility] in 2 weeks or so)  Check your weight on daily basis          Current Discharge Medication List    START taking these medications   Details  levofloxacin (LEVAQUIN) 500 MG tablet Take 1 tablet (500 mg total) by mouth daily. Qty: 6 tablet, Refills: 0    pantoprazole (PROTONIX) 40 MG tablet Take 1 tablet (40 mg total) by  mouth daily. Qty: 30 tablet, Refills: 1    predniSONE (DELTASONE) 20 MG tablet Take 3 tablets by mouth daily X 2 days; then 2 tablets by mouth daily X 2 days; then 1 tablet by mouth daily X 2 days; then 1/2 tablet by mouth daily X 3 days and stop prednisone Qty: 16 tablet, Refills: 0      CONTINUE these medications which have CHANGED   Details  furosemide (LASIX) 40 MG tablet Take 1 tablet (40 mg total) by mouth 2 (two) times daily.   Associated Diagnoses: Benign hypertensive heart disease without heart failure      CONTINUE these medications which have NOT CHANGED   Details  allopurinol (ZYLOPRIM) 100 MG tablet Take 100 mg by mouth daily.     ALPRAZolam (XANAX) 0.25 MG tablet TAKE 1 TABLET BY MOUTH TWICE A DAY AS NEEDED FOR ANXIETY Qty: 120 tablet, Refills: 2   Associated Diagnoses: Anxiety    aspirin 81 MG tablet Take 81 mg by mouth daily.      calcium carbonate 200 MG capsule Take 500 mg by mouth daily.      clopidogrel (PLAVIX) 75 MG tablet Take 1 tablet (75 mg total) by mouth daily. With breakfast Qty: 90 tablet, Refills: 0    CRESTOR 10 MG tablet TAKE 1 TABLET BY MOUTH DAILY Qty: 90 tablet, Refills:  0    [facility] D3 1000 UNITS capsule Take 1,000 Units by mouth daily. Refills: 5    fish oil-omega-3 fatty acids 1000 MG capsule Take 2 g by mouth daily.      guaiFENesin (MUCINEX) 600 MG 12 hr tablet Take 1,200 mg by mouth 2 (two) times daily as needed for cough or to loosen phlegm.     ipratropium (ATROVENT) 0.02 % nebulizer solution Take 4-6 times daily scheduled Qty: 75 mL, Refills: 12    irbesartan (AVAPRO) 75 MG tablet TAKE 1 TABLET (75 MG TOTAL) BY MOUTH DAILY. Qty: 30 tablet, Refills: 3    isosorbide mononitrate (IMDUR) 60 MG 24 hr tablet TAKE 1 TABLET (60 MG TOTAL) BY MOUTH 2 (TWO) TIMES DAILY. Qty: 180 tablet, Refills: 1    KLOR-CON M20 20 MEQ tablet TAKE 1 TABLET BY MOUTH TWICE A DAY Qty: 180 tablet, Refills: 1    meclizine (ANTIVERT) 25 MG tablet Take 1 tablet  (25 mg total) by mouth 3 (three) times daily as needed for dizziness. Additional refills from PCP Qty: 30 tablet, Refills: 3    metoprolol (LOPRESSOR) 25 MG tablet Take 1 tablet (25 mg total) by mouth 2 (two) times daily. Qty: 60 tablet, Refills: 5    NITROSTAT 0.4 MG SL tablet PLACE 1 TABLET (0.4 MG TOTAL) UNDER THE TONGUE EVERY 5 (FIVE) MINUTES AS NEEDED. Qty: 25 tablet, Refills: 6    PROAIR HFA 108 (90 BASE) MCG/ACT inhaler INHALE 2 PUFFS INTO LUNGS EVERY 6 HOURS AS NEEDED FOR WHEEZING Qty: 8.5 each, Refills: 5    temazepam (RESTORIL) 30 MG capsule TAKE ONE CAPSULE BY MOUTH AT BEDTIME AS NEEDED Qty: 90 capsule, Refills: 1   Associated Diagnoses: Insomnia    traMADol (ULTRAM) 50 MG tablet Take 50 mg by mouth every 6 (six) hours as needed for moderate pain.       STOP taking these medications     sodium bicarbonate 650 MG tablet        Allergies  Allergen Reactions  . Indomethacin     unknown  . Lipitor [Atorvastatin Calcium]     Leg pain  . Pravachol     Leg pain  . Procardia [Nifedipine]   . Zaroxolyn [Metolazone]    Follow-up Information    Follow up with PARRETT,TAMMY, NP On 10/09/2015.   Specialty:  Pulmonary Disease   Why:  [facility] - Dr Golden Pop Nurse Practitioner @ 3:00pm    Contact information:   [address] [address] [phone]       The results of significant diagnostics from this hospitalization (including imaging, microbiology, ancillary and laboratory) are listed below for reference.    Significant Diagnostic Studies: Dg Chest Portable 1 View  09/21/2015  CLINICAL DATA:  Short of breath and chest pain EXAM: PORTABLE CHEST 1 VIEW COMPARISON:  10/06/2014 FINDINGS: Mild hyperinflation with findings consistent with COPD. Density in the left lung base has improved in the interval. Some of this may be chronic lingular atelectasis. This was present on a CT in 2014. Mild pleural thickening or effusion. Right lung clear Negative  for heart failure IMPRESSION: Left lower lobe pleural and parenchymal density has improved since the prior study. This is likely related to chronic scarring based on prior CT. No superimposed acute abnormality. Electronically Signed   By: Franchot Gallo M.D.   On: 09/21/2015 11:08    Microbiology: Recent Results (from the past 240 hour(s))  MRSA PCR Screening     Status: None  Collection Time: 09/21/15  3:43 PM  Result Value Ref Range Status   MRSA by PCR NEGATIVE NEGATIVE Final    Comment:        The GeneXpert MRSA Assay (FDA approved for NASAL specimens only), is one component of a comprehensive MRSA colonization surveillance program. It is not intended to diagnose MRSA infection nor to guide or monitor treatment for MRSA infections.      Labs: Basic Metabolic Panel:  Recent Labs Lab 09/21/15 1108 09/21/15 1430 09/22/15 0501 09/23/15 0527 09/24/15 0246  NA 139  --  137 138 139  K 4.9  --  4.2 4.4 4.0  CL 98*  --  97* 97* 95*  CO2 36*  --  33* 33* 34*  GLUCOSE 134*  --  197* 193* 172*  BUN 14  --  16 33* 42*  CREATININE 1.03 0.98 1.12 1.13 1.19  CALCIUM 8.9  --  9.0 8.8* 8.6*   CBC:  Recent Labs Lab 09/21/15 1108 09/21/15 1430 09/22/15 0501 09/23/15 0527 09/24/15 0246  WBC 11.2* 9.1 12.1* 19.9* 15.7*  HGB 12.9* 13.1 12.9* 12.6* 12.7*  HCT 39.8 40.6 39.3 38.2* 38.6*  MCV 103.9* 103.8* 103.1* 101.1* 100.8*  PLT 198 218 211 233 211   Cardiac Enzymes:  Recent Labs Lab 09/21/15 1108 09/21/15 1430  TROPONINI <0.03 <0.03   BNP: BNP (last 3 results)  Recent Labs  09/21/15 1108  BNP 114.0*    ProBNP (last 3 results)  Recent Labs  10/06/14 1000  PROBNP 1522.0*    CBG:  Recent Labs Lab 09/23/15 0754 09/23/15 1235 09/23/15 1642 09/23/15 2156 09/24/15 0740  GLUCAP 184* 152* 112* 228* 157*    Signed:  Barton Dubois  [facility] 09/24/2015, 10:45 AM

## 2015-09-24 NOTE — Care Management Note (Signed)
Case Management Note  Patient Details  Name: Sean Figueroa MRN: [id number] Date of Birth: 27-Jul-1933  Subjective/Objective:                   Hypercapnic respiratory failure [facility]), Acute on Chronic Action/Plan:  Discharge planning Expected Discharge Date:  09/24/15               Expected Discharge Plan:  Home/Self Care  In-House Referral:     Discharge planning Services  CM Consult  Post Acute Care Choice:  Home Health Choice offered to:  Patient  DME Arranged:    DME Agency:  [facility]  HH Arranged:  PT, OT, Nurse's Aide [place] Agency:     Status of Service:  Completed, signed off  Medicare Important Message Given:  Yes-second notification given Date Medicare IM Given:    Medicare IM give by:    Date Additional Medicare IM Given:    Additional Medicare Important Message give by:     If discussed at [place] of Stay Meetings, dates discussed:    Additional Comments: Cm met with pt in room who states his son is bringing tank to hospital but could we please wheel him to the car with our 02 and then we can hook him up to his O2 (i.e. Son does not want to come up to room with 02; CM relayed this to the RN.  Cm offered choice of home health agency.  Pt chooses [facility] to render HHPT/OT/ade.  Referral called to [facility] rep, Tiffany.  CM told charge pt's son WILL be bringing home tank at pickup.  No other CM needs were communicated. Dellie Catholic, RN 09/24/2015, 12:24 PM

## 2015-09-24 NOTE — Progress Notes (Signed)
Pt given discharge instructions, prescriptions, and care notes. Pt verbalized understanding AEB no further questions or concerns at this time. IV was discontinued, no redness, pain, or swelling noted at this time. Pt left the floor via wheelchair with staff in stable condition. 

## 2015-09-24 NOTE — Care Management Important Message (Signed)
Important Message  Patient Details  Name: Sean Figueroa MRN: [id number] Date of Birth: [birth year]   Medicare Important Message Given:  Yes-second notification given    Nathen May 09/24/2015, 10:15 AM

## 2015-09-25 LAB — HEMOGLOBIN A1C
HEMOGLOBIN A1C: 6 % — AB (ref 4.8–5.6)
MEAN PLASMA GLUCOSE: 126 mg/dL

## 2015-10-03 ENCOUNTER — Telehealth: Payer: Self-pay | Admitting: Internal Medicine

## 2015-10-03 NOTE — Telephone Encounter (Signed)
Spoke with pt. He is stating his pharmacy is telling him his proair can't refilled until after the first of the year. While speaking to him, he is very confused about what he was told. Attempted to call pt's pharmacy, line kept ringing busy. Will try back.

## 2015-10-04 NOTE — Telephone Encounter (Addendum)
Called and spoke with [facility] and they advised me that patient received 3 inhalers on 09/08/2015 and that he cannot get anymore refills until the beginning of the year. Attempted to contact patient to discuss.  Attempted to contact patient 2 times.  1st time, the daughter answered the phone and said that "daddy is doing fine" and that he has appointment with our office tomorrow, then she hung up.  The 2nd time I called, the daughter answered the phone again, could hear her in the background saying "daddy someone on the phone want to talk to you" and then she came back to phone, said "call back in 40min" and hung up again.

## 2015-10-05 NOTE — Telephone Encounter (Signed)
Pt returning csll.Sean Figueroa

## 2015-10-05 NOTE — Telephone Encounter (Signed)
Spoke with the pt  I advised that per pharmacist he can not have any more albuterol without having to pay out of pocket due to having already been dispensed 3 inhalers 09/08/15 Pt verbalized understanding  He was advised to keep f/u to discuss meds

## 2015-10-05 NOTE — Telephone Encounter (Signed)
LVM for pt to return call

## 2015-10-07 ENCOUNTER — Other Ambulatory Visit: Payer: Self-pay | Admitting: Cardiology

## 2015-10-09 ENCOUNTER — Ambulatory Visit (INDEPENDENT_AMBULATORY_CARE_PROVIDER_SITE_OTHER): Payer: Medicare Other | Admitting: Adult Health

## 2015-10-09 ENCOUNTER — Encounter: Payer: Self-pay | Admitting: Adult Health

## 2015-10-09 VITALS — BP 110/64 | HR 67 | Temp 97.5°F | Ht 68.0 in | Wt 160.0 lb

## 2015-10-09 DIAGNOSIS — J441 Chronic obstructive pulmonary disease with (acute) exacerbation: Secondary | ICD-10-CM | POA: Diagnosis not present

## 2015-10-09 DIAGNOSIS — J9611 Chronic respiratory failure with hypoxia: Secondary | ICD-10-CM

## 2015-10-09 DIAGNOSIS — I872 Venous insufficiency (chronic) (peripheral): Secondary | ICD-10-CM

## 2015-10-09 NOTE — Progress Notes (Signed)
Subjective:    Patient ID: Sean Figueroa, male    DOB: 05/03/1933, 81 y.o.   MRN: [id number]  HPI 81 yo smoker with severe GOLD III/IV COPD and CHF   10/09/2015 Post hospital follow up  Pt returns for a post hospital follow-up Patient was recently admitted with COPD exacerbation with acute on chronic respiratory failure. He was treated with antibiotics, steroids, and nebulizers Patient was present on Advair and Spiriva. However, is been a notable to afford and is now on DuoNeb 4 times daily.  Does continue to smoke. We discussed smoking cessation. Since discharge he is improved. He continues to have some residual cough and shortness of breath.. He denies any hemoptysis, orthopnea, PND, or fever. Is complain that he has leg swelling that is chronic.   Past Medical History  Diagnosis Date  . 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 Has chronic angina; S/P DES to the LCX and DES to the L MAIN 12/2011. Now on Plavix and ASA.   Marland Kitchen Hyperlipidemia   . Hypertension   . Long-term (current) use of anticoagulants     For hx of LV apical aneurysm  . LVH (left ventricular hypertrophy)   . Atrial fibrillation ([place])     Unclear history  . Tobacco abuse   . Systolic heart failure     EF is 35% per cath 12/2011  . Myocardial infarction [facility]) 1981    [name] 01/14/2012  . Left ventricular apical thrombus ([place])     hx/notes 10/06/2014  . On home oxygen therapy     "2.5L 24/7" (10/06/2014)  . COPD (chronic obstructive pulmonary disease) ([place])   . Pneumonia "several times"  . Arthritis     "shoulders; back" (10/06/2014)  . History of gout   . Chronic back pain     "anytime it's wet and cold" (10/06/2014)  . Nocturia   . Syncopal episodes    Current Outpatient Prescriptions on File Prior to Visit  Medication Sig Dispense Refill  . allopurinol (ZYLOPRIM) 100 MG tablet Take 100 mg by  mouth daily.     Marland Kitchen ALPRAZolam (XANAX) 0.25 MG tablet TAKE 1 TABLET BY MOUTH TWICE A DAY AS NEEDED FOR ANXIETY 120 tablet 2  . aspirin 81 MG tablet Take 81 mg by mouth daily.      . calcium carbonate 200 MG capsule Take 500 mg by mouth daily.      . clopidogrel (PLAVIX) 75 MG tablet Take 1 tablet (75 mg total) by mouth daily. With breakfast 90 tablet 0  . CRESTOR 10 MG tablet TAKE 1 TABLET BY MOUTH DAILY 90 tablet 0  . [facility] D3 1000 UNITS capsule Take 1,000 Units by mouth daily.  5  . fish oil-omega-3 fatty acids 1000 MG capsule Take 2 g by mouth daily.      . furosemide (LASIX) 40 MG tablet Take 1 tablet (40 mg total) by mouth 2 (two) times daily.    Marland Kitchen guaiFENesin (MUCINEX) 600 MG 12 hr tablet Take 1,200 mg by mouth 2 (two) times daily as needed for cough or to loosen phlegm.     Marland Kitchen ipratropium (ATROVENT) 0.02 % nebulizer solution Take 4-6 times daily scheduled 75 mL 12  . irbesartan (AVAPRO) 75 MG tablet TAKE 1 TABLET (75 MG TOTAL) BY MOUTH DAILY. 30 tablet 3  . isosorbide mononitrate (IMDUR) 60 MG 24 hr tablet TAKE  1 TABLET (60 MG TOTAL) BY MOUTH 2 (TWO) TIMES DAILY. 180 tablet 1  . KLOR-CON M20 20 MEQ tablet TAKE 1 TABLET BY MOUTH TWICE A DAY 180 tablet 1  . meclizine (ANTIVERT) 25 MG tablet Take 1 tablet (25 mg total) by mouth 3 (three) times daily as needed for dizziness. Additional refills from PCP 30 tablet 3  . metoprolol (LOPRESSOR) 25 MG tablet Take 1 tablet (25 mg total) by mouth 2 (two) times daily. 60 tablet 5  . NITROSTAT 0.4 MG SL tablet PLACE 1 TABLET (0.4 MG TOTAL) UNDER THE TONGUE EVERY 5 (FIVE) MINUTES AS NEEDED. 25 tablet 6  . pantoprazole (PROTONIX) 40 MG tablet Take 1 tablet (40 mg total) by mouth daily. 30 tablet 1  . predniSONE (DELTASONE) 20 MG tablet Take 3 tablets by mouth daily X 2 days; then 2 tablets by mouth daily X 2 days; then 1 tablet by mouth daily X 2 days; then 1/2 tablet by mouth daily X 3 days and stop prednisone 16 tablet 0  . PROAIR HFA 108 (90 BASE) MCG/ACT  inhaler INHALE 2 PUFFS INTO LUNGS EVERY 6 HOURS AS NEEDED FOR WHEEZING 8.5 each 5  . temazepam (RESTORIL) 30 MG capsule TAKE ONE CAPSULE BY MOUTH AT BEDTIME AS NEEDED 90 capsule 1  . traMADol (ULTRAM) 50 MG tablet Take 50 mg by mouth every 6 (six) hours as needed for moderate pain.     Marland Kitchen levofloxacin (LEVAQUIN) 500 MG tablet Take 1 tablet (500 mg total) by mouth daily. (Patient not taking: Reported on 10/09/2015) 6 tablet 0   No current facility-administered medications on file prior to visit.      Review of Systems Constitutional:   No  weight loss, night sweats,  Fevers, chills, fatigue, or  lassitude.  HEENT:   No headaches,  Difficulty swallowing,  Tooth/dental problems, or  Sore throat,                No sneezing, itching, ear ache, nasal congestion, post nasal drip,   CV:  No chest pain,  Orthopnea, PND, +swelling in lower extremities, anasarca, dizziness, palpitations, syncope.   GI  No heartburn, indigestion, abdominal pain, nausea, vomiting, diarrhea, change in bowel habits, loss of appetite, bloody stools.   Resp:  No chest wall deformity  Skin: no rash or lesions.  GU: no dysuria, change in color of urine, no urgency or frequency.  No flank pain, no hematuria   MS:  No joint pain or swelling.  No decreased range of motion.  No back pain.  Psych:  No change in mood or affect. No depression or anxiety.  No memory loss.         Objective:   Physical Exam GEN: A/Ox3; pleasant , NAD, chronically ill appearing on O2 in wc  VS reviewed   HEENT:  [place]/AT,  EACs-clear, TMs-wnl, NOSE-clear, THROAT-clear, no lesions, no postnasal drip or exudate noted. Poor dentiton   NECK:  Supple w/ fair ROM; no JVD; normal carotid impulses w/o bruits; no thyromegaly or nodules palpated; no lymphadenopathy.  RESP  Decreased BS in bases no accessory muscle use, no dullness to percussion  CARD:  RRR, no m/r/g  , tr  -1+ peripheral edema, pulses intact, no cyanosis or clubbing.  GI:    Soft & nt; nml bowel sounds; no organomegaly or masses detected.  Musco: Warm bil, no deformities or joint swelling noted.   Neuro: alert, no focal deficits noted.    Skin: Warm, stasis dermatatic changes  in legs          Assessment & Plan:

## 2015-10-09 NOTE — Assessment & Plan Note (Signed)
Recent exacerbation, now resolving  Plan Continue on Duoneb Four times a day  .  Continue on Oxygen 2.5l/m .  Work on not smoking .  Wash legs gently with soap/water, rinse and pat dry gently  Apply non stick bandage . If not healing will need to see primary doctor.  Keep legs elevated.  Low salt diet .  Follow up Dr. Chase Caller in 2-3 months and As needed   .Please contact office for sooner follow up if symptoms do not improve or worsen or seek emergency care

## 2015-10-09 NOTE — Patient Instructions (Signed)
Continue on Duoneb Four times a day  .  Continue on Oxygen 2.5l/m .  Work on not smoking .  Wash legs gently with soap/water, rinse and pat dry gently  Apply non stick bandage . If not healing will need to see primary doctor.  Keep legs elevated.  Low salt diet .  Follow up Dr. Chase Caller in 2-3 months and As needed   .Please contact office for sooner follow up if symptoms do not improve or worsen or seek emergency care

## 2015-10-09 NOTE — Assessment & Plan Note (Signed)
Chronic venous insufficiency with stasis dermatitis Skin care discussed  Plan  Wash legs gently with soap/water, rinse and pat dry gently  Apply non stick bandage . If not healing will need to see primary doctor.  Keep legs elevated.  Low salt diet .  Follow up Dr. Chase Caller in 2-3 months and As needed   .Please contact office for sooner follow up if symptoms do not improve or worsen or seek emergency care

## 2015-10-09 NOTE — Assessment & Plan Note (Signed)
Cont on O2 .  

## 2015-10-11 ENCOUNTER — Other Ambulatory Visit: Payer: Self-pay | Admitting: Cardiology

## 2015-11-03 ENCOUNTER — Ambulatory Visit: Payer: Medicare Other | Admitting: Internal Medicine

## 2015-11-06 ENCOUNTER — Other Ambulatory Visit: Payer: Self-pay | Admitting: Cardiology

## 2015-11-09 ENCOUNTER — Other Ambulatory Visit: Payer: Self-pay | Admitting: Cardiology

## 2015-11-09 NOTE — Telephone Encounter (Signed)
This Order Has Been Discontinued     Order Status By On Reason    Discontinued Radene Gunning, NP 09/21/15 1340 None      Medication Detail       Disp Refills Start End      RANEXA 500 MG 12 hr tablet (Discontinued) 180 tablet 0 08/11/2015 09/21/2015     Sig: TAKE 1 TABLET TWICE DAILY     Patient not taking: Reported on 08/11/2015         E-Prescribing Status: Receipt confirmed by pharmacy (08/11/2015 10:17 AM EDT)

## 2015-11-13 ENCOUNTER — Other Ambulatory Visit: Payer: Self-pay | Admitting: Cardiology

## 2015-11-14 ENCOUNTER — Other Ambulatory Visit: Payer: Self-pay | Admitting: *Deleted

## 2015-11-14 MED ORDER — PANTOPRAZOLE SODIUM 40 MG PO TBEC: 40.0000 mg | DELAYED_RELEASE_TABLET | Freq: Every day | ORAL | Status: DC

## 2015-11-14 MED ORDER — ROSUVASTATIN CALCIUM 10 MG PO TABS: 10.0000 mg | ORAL_TABLET | Freq: Every day | ORAL | Status: DC

## 2015-12-11 ENCOUNTER — Telehealth: Payer: Self-pay | Admitting: Adult Health

## 2015-12-11 NOTE — Telephone Encounter (Signed)
I am not sure is lung related. Based on info this does not sound like AECOPD. Mayub muscle pull for which he can try OTC tylenol or ibuprofen. If no releif or has concerns go to ER  Or visit [place] or see PCP [name], MD . I am unable to help further on telephone

## 2015-12-11 NOTE — Telephone Encounter (Signed)
Is coug worse than baseline? How many days cough?   When did pain start? How bad is it? What aggravates it? What relieves it?  Any leg swelling?  Any hemoptysis?   Alo I am out from this PM throuigh all day thu

## 2015-12-11 NOTE — Telephone Encounter (Signed)
Called spoke with pt. Aware of recs below. He verbalized understanding and had no questions.

## 2015-12-11 NOTE — Telephone Encounter (Signed)
Spoke with pt. Reports pain in his upper back on the right side. States that this pain started on Friday. Coughing is present with production of white mucus. Denies SOB, chest tightness/pain, wheezing or fever. Would like recommendations.  MR - please advise. Thanks.

## 2015-12-11 NOTE — Telephone Encounter (Signed)
Called spoke with pt. He reports his cough is not worse than baseline and has been going on x Friday. He reports he thinks the pain maybe related to the weather. Rates pain 5/10 on pain scale. When he moves around it makes pain worse. No swelling, no hemoptysis. Please advise thanks

## 2015-12-12 ENCOUNTER — Other Ambulatory Visit: Payer: Self-pay | Admitting: Cardiology

## 2015-12-12 DIAGNOSIS — K219 Gastro-esophageal reflux disease without esophagitis: Secondary | ICD-10-CM

## 2015-12-12 NOTE — Telephone Encounter (Signed)
Looks like this was originally prescribed by Barton Dubois. Ok to refill?

## 2015-12-13 ENCOUNTER — Encounter: Payer: Self-pay | Admitting: Cardiology

## 2015-12-13 ENCOUNTER — Ambulatory Visit (INDEPENDENT_AMBULATORY_CARE_PROVIDER_SITE_OTHER): Payer: Medicare Other | Admitting: Cardiology

## 2015-12-13 VITALS — BP 120/60 | HR 80 | Ht 68.0 in | Wt 160.0 lb

## 2015-12-13 DIAGNOSIS — I480 Paroxysmal atrial fibrillation: Secondary | ICD-10-CM | POA: Diagnosis not present

## 2015-12-13 NOTE — Patient Instructions (Signed)
Medication Instructions:  Your physician recommends that you continue on your current medications as directed. Please refer to the Current Medication list given to you today.   Labwork: none  Testing/Procedures: none  Follow-Up: Your physician recommends that you schedule a follow-up appointment in: 4 months with Dr. Stanford Breed.    Any Other Special Instructions Will Be Listed Below (If Applicable).     If you need a refill on your cardiac medications before your next appointment, please call your pharmacy.

## 2015-12-13 NOTE — Progress Notes (Signed)
Cardiology Office Note   Date:  12/13/2015   ID:  Sean Figueroa, DOB 12-16-1932, MRN [id number]  PCP:  Sean Panda, MD  Cardiologist: Darlin Coco MD  Chief Complaint  Patient presents with  . 4 month follow up      History of Present Illness: Sean Figueroa is a 82 y.o. male who presents for   scheduled follow-up visit  This pleasant 82 year old gentleman is seen for a scheduled followup office visit. He has ischemic heart disease. He had a history of a remote apical myocardial infarction. He developed crescendo angina in March 2013. He underwent high risk drug-eluting stent placement to the circumflex and a drug-eluting stent to the left main with support of the impellla device by Dr. Burt Knack. He is now on long-term dual antiplatelet therapy with aspirin and Plavix. Since last visit he has been doing well from the cardiac standpoint. He has not had to take nitroglycerin more than once or twice since he had his stents. He has had no chest pain recently. He continues to smoke and he is trying to quit. The patient presented to [facility] on 10/06/14 with dyspnea and right leg pain. He was found to be in acute heart failure and there was concern for right lower extremity cellulitis.  He was diuresed with IV Lasix and lost 5 pounds during his hospital stay. As his volume status improved, his respiratory status improved. His renal function and electrolytes were followed closely and his potassium was supplemented as needed.  His right leg improved on antibiotics and he was instructed to complete antibiotic course as an outpatient.  Due to recent history of falls at home and acute heart failure exacerbation, a HHRN, PT/OT were ordered at time of discharge. He was also sent home on home O2 as well. He returns today with a neighbor who will become his healthcare power of attorney soon. The patient's only living relative is a daughter who is in a nursing home and is in very poor health. The patient  also has a son with mental problems and is unable to care for himself Since last visit the patient has been doing reasonably well. Unfortunately he still smokes occasionally. He has had a cough for the past month. He has received 2 different antibiotics from his PCP. He has not been taking any Mucinex recently.  his last episode of chest pain was about 2 weeks ago.  He took 2 nitroglycerin with relief.  The patient has a lot of GI symptoms and is on ranitidine for dyspepsia.  Past Medical History  Diagnosis Date  . 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 Has chronic angina; S/P DES to the LCX and DES to the L MAIN 12/2011. Now on Plavix and ASA.   Marland Kitchen Hyperlipidemia   . Hypertension   . Long-term (current) use of anticoagulants     For hx of LV apical aneurysm  . LVH (left ventricular hypertrophy)   . Atrial fibrillation ([place])     Unclear history  . Tobacco abuse   . Systolic heart failure     EF is 35% per cath 12/2011  . Myocardial infarction [facility]) 1981    Sean Figueroa 01/14/2012  . Left ventricular apical thrombus ([place])     hx/notes 10/06/2014  . On home oxygen therapy     "2.5L 24/7" (10/06/2014)  . COPD (chronic obstructive pulmonary  disease) ([place])   . Pneumonia "several times"  . Arthritis     "shoulders; back" (10/06/2014)  . History of gout   . Chronic back pain     "anytime it's wet and cold" (10/06/2014)  . Nocturia   . Syncopal episodes     Past Surgical History  Procedure Laterality Date  . Cholecystectomy    . Hernia repair    . Coronary angioplasty with stent placement  12/2011    LCX and L main - drug eluting  . Cardiac catheterization  1981    Sean Figueroa 01/14/2012  . Left heart catheterization with coronary angiogram N/A 01/16/2012    Procedure: LEFT HEART CATHETERIZATION WITH CORONARY ANGIOGRAM;  Surgeon: Sherren Mocha, MD;  Location: [facility] CATH LAB;  Service:  Cardiovascular;  Laterality: N/A;  . Percutaneous coronary stent intervention (pci-s) N/A 01/20/2012    Procedure: PERCUTANEOUS CORONARY STENT INTERVENTION (PCI-S);  Surgeon: Sherren Mocha, MD;  Location: [facility] CATH LAB;  Service: Cardiovascular;  Laterality: N/A;     Current Outpatient Prescriptions  Medication Sig Dispense Refill  . allopurinol (ZYLOPRIM) 100 MG tablet Take 100 mg by mouth daily.     Marland Kitchen ALPRAZolam (XANAX) 0.25 MG tablet TAKE 1 TABLET BY MOUTH TWICE A DAY AS NEEDED FOR ANXIETY 120 tablet 2  . aspirin 81 MG tablet Take 81 mg by mouth daily.      . calcium carbonate 200 MG capsule Take 500 mg by mouth daily.      . clopidogrel (PLAVIX) 75 MG tablet TAKE 1 TABLET (75 MG TOTAL) BY MOUTH DAILY. WITH BREAKFAST 90 tablet 1  . [employer] D3 1000 UNITS capsule Take 1,000 Units by mouth daily.  5  . fish oil-omega-3 fatty acids 1000 MG capsule Take 2 g by mouth daily.      . furosemide (LASIX) 40 MG tablet Take 1 tablet (40 mg total) by mouth 2 (two) times daily.    Marland Kitchen guaiFENesin (MUCINEX) 600 MG 12 hr tablet Take 1,200 mg by mouth 2 (two) times daily as needed for cough or to loosen phlegm.     Marland Kitchen ipratropium (ATROVENT) 0.02 % nebulizer solution Take 0.5 mg by nebulization 4 (four) times daily.    . irbesartan (AVAPRO) 75 MG tablet TAKE 1 TABLET (75 MG TOTAL) BY MOUTH DAILY. 30 tablet 3  . isosorbide mononitrate (IMDUR) 60 MG 24 hr tablet TAKE 1 TABLET (60 MG TOTAL) BY MOUTH 2 (TWO) TIMES DAILY. 180 tablet 1  . KLOR-CON M20 20 MEQ tablet TAKE 1 TABLET BY MOUTH TWICE A DAY 180 tablet 1  . levofloxacin (LEVAQUIN) 500 MG tablet Take 1 tablet (500 mg total) by mouth daily. 6 tablet 0  . meclizine (ANTIVERT) 25 MG tablet Take 1 tablet (25 mg total) by mouth 3 (three) times daily as needed for dizziness. Additional refills from PCP 30 tablet 3  . metoprolol (LOPRESSOR) 25 MG tablet Take 1 tablet (25 mg total) by mouth 2 (two) times daily. 60 tablet 5  . NITROSTAT 0.4 MG SL tablet PLACE 1 TABLET (0.4 MG  TOTAL) UNDER THE TONGUE EVERY 5 (FIVE) MINUTES AS NEEDED. 25 tablet 6  . pantoprazole (PROTONIX) 40 MG tablet TAKE 1 TABLET BY MOUTH DAILY 30 tablet 2  . predniSONE (DELTASONE) 20 MG tablet Take 3 tablets by mouth daily X 2 days; then 2 tablets by mouth daily X 2 days; then 1 tablet by mouth daily X 2 days; then 1/2 tablet by mouth daily X 3 days and stop prednisone 16 tablet  0  . PROAIR HFA 108 (90 BASE) MCG/ACT inhaler INHALE 2 PUFFS INTO LUNGS EVERY 6 HOURS AS NEEDED FOR WHEEZING 8.5 each 5  . ranitidine (ZANTAC) 150 MG capsule Take 150 mg by mouth at bedtime.  1  . rosuvastatin (CRESTOR) 10 MG tablet TAKE 1 TABLET (10 MG TOTAL) BY MOUTH DAILY. 30 tablet 7  . sulfamethoxazole-trimethoprim (BACTRIM DS,SEPTRA DS) 800-160 MG tablet Take 1 tablet by mouth every 12 (twelve) hours.  0  . temazepam (RESTORIL) 30 MG capsule TAKE ONE CAPSULE BY MOUTH AT BEDTIME AS NEEDED 90 capsule 1  . traMADol (ULTRAM) 50 MG tablet Take 50 mg by mouth every 6 (six) hours as needed for moderate pain.      No current facility-administered medications for this visit.    Allergies:   Indomethacin; Lipitor; Pravachol; Procardia; and Zaroxolyn    Social History:  The patient  reports that he has been smoking Cigarettes.  He has a 48.75 pack-year smoking history. He has never used smokeless tobacco. He reports that he drinks alcohol. He reports that he does not use illicit drugs.   Family History:  The patient's family history includes Cerebral palsy in his daughter; Heart disease in his mother; Hypertension in his mother.    ROS:  Please see the history of present illness.   Otherwise, review of systems are positive for none.   All other systems are reviewed and negative.    PHYSICAL EXAM: VS:  BP 120/60 mmHg  Pulse 80  Ht 5\' 8"  (1.727 m)  Wt 160 lb (72.576 kg)  BMI 24.33 kg/m2 , BMI Body mass index is 24.33 kg/(m^2). GEN: Well nourished, well developed, in no acute distress HEENT: normal Neck: no JVD, carotid  bruits, or masses Cardiac: RRR; no murmurs, rubs, or gallops,no edema  Respiratory:   The patient is on nasal oxygen.  He has expiratory wheezes bilaterally related to his smoking GI: soft, nontender, nondistended, + BS MS: no deformity or atrophy Skin: warm and dry, no rash Neuro:  Strength and sensation are intact Psych: euthymic mood, full affect   EKG:  EKG is ordered today. The ekg ordered today demonstrates  Normal sinus rhythm with occasional PACs. Anteroseptal myocardial infarction. Incomplete right bundle branch block. Since prior tracing of 09/21/15, no significant change   Recent Labs: 09/21/2015: B Natriuretic Peptide 114.0* 09/24/2015: BUN 42*; Creatinine, Ser 1.19; Hemoglobin 12.7*; Platelets 211; Potassium 4.0; Sodium 139    Lipid Panel    Component Value Date/Time   CHOL 110 11/15/2014 1120   TRIG 50.0 11/15/2014 1120   HDL 44.50 11/15/2014 1120   CHOLHDL 2 11/15/2014 1120   VLDL 10.0 11/15/2014 1120   LDLCALC 56 11/15/2014 1120      Wt Readings from Last 3 Encounters:  12/13/15 160 lb (72.576 kg)  10/09/15 160 lb (72.576 kg)  09/24/15 163 lb 9.3 oz (74.2 kg)        ASSESSMENT AND PLAN:  1. Ischemic heart disease status post remote apical myocardial infarction and more recently status post drug-eluting stent placement in March 2013 to the circumflex and drug-eluting stent to the left main coronary artery. The patient is on long-term dual antiplatelet therapy. 2. COPD with ongoing tobacco abuse 3. Hypercholesterolemia 4. past history of gout, on allopurinol 5. Osteoarthritis 6. Dizziness and history of falls 7. Borderline low blood pressure     Current medicines are reviewed at length with the patient today.  The patient does not have concerns regarding medicines.  The following  changes have been made:  no change  Labs/ tests ordered today include:   Orders Placed This Encounter  Procedures  . EKG 12-Lead     Disposition:    The patient  must stop smoking. Some of his dyspepsia as well as his respiratory problem is related to the smoking. Medication. He is working with social services who have told him that he needs to move out of his home and into some type of assisted living or nursing home situation. He will continue current medication. Recheck in 4 months for office visit with Dr. Stanford Breed  Signed, Darlin Coco MD 12/13/2015 5:34 PM    [address], [place], [place]  [address] Phone: [phone]; Fax: [phone]

## 2015-12-22 ENCOUNTER — Other Ambulatory Visit: Payer: Self-pay | Admitting: Physician Assistant

## 2015-12-24 ENCOUNTER — Encounter (HOSPITAL_COMMUNITY): Payer: Self-pay | Admitting: Family Medicine

## 2015-12-24 ENCOUNTER — Inpatient Hospital Stay (HOSPITAL_COMMUNITY)
Admission: EM | Admit: 2015-12-24 | Discharge: 2016-01-01 | DRG: 374 | Disposition: A | Discharge status: Hospice | Payer: Medicare Other | Attending: Internal Medicine | Admitting: Internal Medicine

## 2015-12-24 DIAGNOSIS — K922 Gastrointestinal hemorrhage, unspecified: Secondary | ICD-10-CM | POA: Diagnosis present

## 2015-12-24 DIAGNOSIS — K219 Gastro-esophageal reflux disease without esophagitis: Secondary | ICD-10-CM | POA: Diagnosis not present

## 2015-12-24 DIAGNOSIS — J449 Chronic obstructive pulmonary disease, unspecified: Secondary | ICD-10-CM | POA: Diagnosis present

## 2015-12-24 DIAGNOSIS — C155 Malignant neoplasm of lower third of esophagus: Secondary | ICD-10-CM | POA: Diagnosis present

## 2015-12-24 DIAGNOSIS — Z7982 Long term (current) use of aspirin: Secondary | ICD-10-CM

## 2015-12-24 DIAGNOSIS — C159 Malignant neoplasm of esophagus, unspecified: Secondary | ICD-10-CM | POA: Insufficient documentation

## 2015-12-24 DIAGNOSIS — R1310 Dysphagia, unspecified: Secondary | ICD-10-CM | POA: Diagnosis not present

## 2015-12-24 DIAGNOSIS — Z6823 Body mass index (BMI) 23.0-23.9, adult: Secondary | ICD-10-CM | POA: Diagnosis not present

## 2015-12-24 DIAGNOSIS — D72829 Elevated white blood cell count, unspecified: Secondary | ICD-10-CM | POA: Diagnosis present

## 2015-12-24 DIAGNOSIS — I5022 Chronic systolic (congestive) heart failure: Secondary | ICD-10-CM | POA: Diagnosis present

## 2015-12-24 DIAGNOSIS — D500 Iron deficiency anemia secondary to blood loss (chronic): Secondary | ICD-10-CM | POA: Diagnosis present

## 2015-12-24 DIAGNOSIS — I480 Paroxysmal atrial fibrillation: Secondary | ICD-10-CM | POA: Diagnosis present

## 2015-12-24 DIAGNOSIS — Z66 Do not resuscitate: Secondary | ICD-10-CM | POA: Diagnosis present

## 2015-12-24 DIAGNOSIS — Z888 Allergy status to other drugs, medicaments and biological substances status: Secondary | ICD-10-CM | POA: Diagnosis not present

## 2015-12-24 DIAGNOSIS — M19012 Primary osteoarthritis, left shoulder: Secondary | ICD-10-CM | POA: Diagnosis present

## 2015-12-24 DIAGNOSIS — L03115 Cellulitis of right lower limb: Secondary | ICD-10-CM | POA: Diagnosis present

## 2015-12-24 DIAGNOSIS — M479 Spondylosis, unspecified: Secondary | ICD-10-CM | POA: Diagnosis present

## 2015-12-24 DIAGNOSIS — K2289 Other specified disease of esophagus: Secondary | ICD-10-CM

## 2015-12-24 DIAGNOSIS — E46 Unspecified protein-calorie malnutrition: Secondary | ICD-10-CM | POA: Diagnosis present

## 2015-12-24 DIAGNOSIS — Z9981 Dependence on supplemental oxygen: Secondary | ICD-10-CM | POA: Diagnosis not present

## 2015-12-24 DIAGNOSIS — R9389 Abnormal findings on diagnostic imaging of other specified body structures: Secondary | ICD-10-CM | POA: Insufficient documentation

## 2015-12-24 DIAGNOSIS — J690 Pneumonitis due to inhalation of food and vomit: Secondary | ICD-10-CM | POA: Diagnosis present

## 2015-12-24 DIAGNOSIS — E785 Hyperlipidemia, unspecified: Secondary | ICD-10-CM | POA: Diagnosis present

## 2015-12-24 DIAGNOSIS — F17210 Nicotine dependence, cigarettes, uncomplicated: Secondary | ICD-10-CM | POA: Diagnosis present

## 2015-12-24 DIAGNOSIS — Z515 Encounter for palliative care: Secondary | ICD-10-CM | POA: Insufficient documentation

## 2015-12-24 DIAGNOSIS — E876 Hypokalemia: Secondary | ICD-10-CM | POA: Diagnosis present

## 2015-12-24 DIAGNOSIS — M109 Gout, unspecified: Secondary | ICD-10-CM | POA: Diagnosis present

## 2015-12-24 DIAGNOSIS — L03116 Cellulitis of left lower limb: Secondary | ICD-10-CM | POA: Diagnosis present

## 2015-12-24 DIAGNOSIS — I2510 Atherosclerotic heart disease of native coronary artery without angina pectoris: Secondary | ICD-10-CM | POA: Diagnosis present

## 2015-12-24 DIAGNOSIS — K210 Gastro-esophageal reflux disease with esophagitis: Secondary | ICD-10-CM | POA: Diagnosis present

## 2015-12-24 DIAGNOSIS — Z720 Tobacco use: Secondary | ICD-10-CM | POA: Diagnosis present

## 2015-12-24 DIAGNOSIS — M19011 Primary osteoarthritis, right shoulder: Secondary | ICD-10-CM | POA: Diagnosis present

## 2015-12-24 DIAGNOSIS — M542 Cervicalgia: Secondary | ICD-10-CM | POA: Diagnosis present

## 2015-12-24 DIAGNOSIS — R112 Nausea with vomiting, unspecified: Secondary | ICD-10-CM | POA: Diagnosis present

## 2015-12-24 DIAGNOSIS — F419 Anxiety disorder, unspecified: Secondary | ICD-10-CM

## 2015-12-24 DIAGNOSIS — Z7901 Long term (current) use of anticoagulants: Secondary | ICD-10-CM | POA: Diagnosis not present

## 2015-12-24 DIAGNOSIS — Z79899 Other long term (current) drug therapy: Secondary | ICD-10-CM | POA: Diagnosis not present

## 2015-12-24 DIAGNOSIS — K229 Disease of esophagus, unspecified: Secondary | ICD-10-CM | POA: Diagnosis not present

## 2015-12-24 DIAGNOSIS — G8929 Other chronic pain: Secondary | ICD-10-CM | POA: Diagnosis present

## 2015-12-24 DIAGNOSIS — K2970 Gastritis, unspecified, without bleeding: Secondary | ICD-10-CM | POA: Diagnosis present

## 2015-12-24 DIAGNOSIS — I10 Essential (primary) hypertension: Secondary | ICD-10-CM | POA: Diagnosis present

## 2015-12-24 DIAGNOSIS — R1110 Vomiting, unspecified: Secondary | ICD-10-CM

## 2015-12-24 DIAGNOSIS — R938 Abnormal findings on diagnostic imaging of other specified body structures: Secondary | ICD-10-CM | POA: Diagnosis not present

## 2015-12-24 DIAGNOSIS — K921 Melena: Secondary | ICD-10-CM | POA: Diagnosis present

## 2015-12-24 DIAGNOSIS — I252 Old myocardial infarction: Secondary | ICD-10-CM

## 2015-12-24 DIAGNOSIS — Z955 Presence of coronary angioplasty implant and graft: Secondary | ICD-10-CM | POA: Diagnosis not present

## 2015-12-24 DIAGNOSIS — R0600 Dyspnea, unspecified: Secondary | ICD-10-CM

## 2015-12-24 DIAGNOSIS — K228 Other specified diseases of esophagus: Secondary | ICD-10-CM | POA: Insufficient documentation

## 2015-12-24 LAB — URINALYSIS, ROUTINE W REFLEX MICROSCOPIC
Glucose, UA: NEGATIVE mg/dL
HGB URINE DIPSTICK: NEGATIVE
KETONES UR: NEGATIVE mg/dL
Leukocytes, UA: NEGATIVE
Nitrite: NEGATIVE
PROTEIN: NEGATIVE mg/dL
Specific Gravity, Urine: 1.02 (ref 1.005–1.030)
pH: 5 (ref 5.0–8.0)

## 2015-12-24 LAB — COMPREHENSIVE METABOLIC PANEL
ALBUMIN: 3.1 g/dL — AB (ref 3.5–5.0)
ALK PHOS: 68 U/L (ref 38–126)
ALT: 12 U/L — AB (ref 17–63)
AST: 16 U/L (ref 15–41)
Anion gap: 13 (ref 5–15)
BILIRUBIN TOTAL: 1 mg/dL (ref 0.3–1.2)
BUN: 21 mg/dL — ABNORMAL HIGH (ref 6–20)
CALCIUM: 8.7 mg/dL — AB (ref 8.9–10.3)
CO2: 29 mmol/L (ref 22–32)
CREATININE: 1.2 mg/dL (ref 0.61–1.24)
Chloride: 91 mmol/L — ABNORMAL LOW (ref 101–111)
GFR calc non Af Amer: 55 mL/min — ABNORMAL LOW (ref >60)
GLUCOSE: 148 mg/dL — AB (ref 65–99)
Potassium: 3.4 mmol/L — ABNORMAL LOW (ref 3.5–5.1)
SODIUM: 133 mmol/L — AB (ref 135–145)
TOTAL PROTEIN: 6 g/dL — AB (ref 6.5–8.1)

## 2015-12-24 LAB — POC OCCULT BLOOD, ED: Fecal Occult Bld: POSITIVE — AB

## 2015-12-24 LAB — CBC
HCT: 40 % (ref 39.0–52.0)
Hemoglobin: 13.6 g/dL (ref 13.0–17.0)
MCH: 34.6 pg — AB (ref 26.0–34.0)
MCHC: 34 g/dL (ref 30.0–36.0)
MCV: 101.8 fL — ABNORMAL HIGH (ref 78.0–100.0)
PLATELETS: 205 10*3/uL (ref 150–400)
RBC: 3.93 MIL/uL — ABNORMAL LOW (ref 4.22–5.81)
RDW: 13.7 % (ref 11.5–15.5)
WBC: 12.7 10*3/uL — ABNORMAL HIGH (ref 4.0–10.5)

## 2015-12-24 LAB — LIPASE, BLOOD: Lipase: 16 U/L (ref 11–51)

## 2015-12-24 LAB — PROTIME-INR
INR: 1.06 (ref 0.00–1.49)
PROTHROMBIN TIME: 14 s (ref 11.6–15.2)

## 2015-12-24 MED ORDER — IPRATROPIUM-ALBUTEROL 0.5-2.5 (3) MG/3ML IN SOLN
3.0000 mL | Freq: Once | RESPIRATORY_TRACT | Status: AC
  Administered 2015-12-24: 3 mL via RESPIRATORY_TRACT
  Filled 2015-12-24: qty 3

## 2015-12-24 MED ORDER — PANTOPRAZOLE SODIUM 40 MG IV SOLR
80.0000 mg | Freq: Once | INTRAVENOUS | Status: AC
  Administered 2015-12-24: 80 mg via INTRAVENOUS
  Filled 2015-12-24: qty 80

## 2015-12-24 MED ORDER — SODIUM CHLORIDE 0.9 % IV SOLN
8.0000 mg/h | INTRAVENOUS | Status: DC
  Administered 2015-12-24 – 2015-12-25 (×2): 8 mg/h via INTRAVENOUS
  Filled 2015-12-24 (×4): qty 80

## 2015-12-24 MED ORDER — POTASSIUM CHLORIDE 10 MEQ/100ML IV SOLN
10.0000 meq | INTRAVENOUS | Status: AC
  Administered 2015-12-24: 10 meq via INTRAVENOUS
  Filled 2015-12-24: qty 100

## 2015-12-24 MED ORDER — PANTOPRAZOLE SODIUM 40 MG IV SOLR
40.0000 mg | Freq: Two times a day (BID) | INTRAVENOUS | Status: DC
  Administered 2015-12-24 – 2015-12-25 (×2): 40 mg via INTRAVENOUS
  Filled 2015-12-24 (×2): qty 40

## 2015-12-24 MED ORDER — ONDANSETRON HCL 4 MG/2ML IJ SOLN
4.0000 mg | Freq: Once | INTRAMUSCULAR | Status: AC
  Administered 2015-12-24: 4 mg via INTRAVENOUS
  Filled 2015-12-24: qty 2

## 2015-12-24 MED ORDER — SODIUM CHLORIDE 0.9 % IV BOLUS (SEPSIS)
1000.0000 mL | Freq: Once | INTRAVENOUS | Status: AC
  Administered 2015-12-24: 1000 mL via INTRAVENOUS

## 2015-12-24 NOTE — ED Provider Notes (Signed)
CSN: [id number]     Arrival date & time 12/24/15  1511 History   First MD Initiated Contact with Patient 12/24/15 1621     Chief Complaint  Patient presents with  . Melena  . Emesis  . Neck Pain     (Consider location/radiation/quality/duration/timing/severity/associated sxs/prior Treatment) HPI Comments: Patient presents emergency department with chief complaint of nausea, vomiting, and melena. Patient states that the symptoms started this morning. He states that he has not been able to keep anything down all day. He reports that he has been slightly dizzy.  States that he has been having dark stools.  He takes aspirin and plavix.  He denies any chest pain, SOB, or abdominal pain.  There are no aggravating or alleviating factors.  He denies any other associated symptoms.  The history is provided by the patient. No language interpreter was used.    Past Medical History  Diagnosis Date  . 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 Has chronic angina; S/P DES to the LCX and DES to the L MAIN 12/2011. Now on Plavix and ASA.   Marland Kitchen Hyperlipidemia   . Hypertension   . Long-term (current) use of anticoagulants     For hx of LV apical aneurysm  . LVH (left ventricular hypertrophy)   . Atrial fibrillation ([place])     Unclear history  . Tobacco abuse   . Systolic heart failure     EF is 35% per cath 12/2011  . Myocardial infarction [facility]) 1981    [name] 01/14/2012  . Left ventricular apical thrombus ([place])     hx/notes 10/06/2014  . On home oxygen therapy     "2.5L 24/7" (10/06/2014)  . COPD (chronic obstructive pulmonary disease) ([place])   . Pneumonia "several times"  . Arthritis     "shoulders; back" (10/06/2014)  . History of gout   . Chronic back pain     "anytime it's wet and cold" (10/06/2014)  . Nocturia   . Syncopal episodes    Past Surgical History  Procedure Laterality Date   . Cholecystectomy    . Hernia repair    . Coronary angioplasty with stent placement  12/2011    LCX and L main - drug eluting  . Cardiac catheterization  1981    [name] 01/14/2012  . Left heart catheterization with coronary angiogram N/A 01/16/2012    Procedure: LEFT HEART CATHETERIZATION WITH CORONARY ANGIOGRAM;  Surgeon: Sherren Mocha, MD;  Location: [facility] CATH LAB;  Service: Cardiovascular;  Laterality: N/A;  . Percutaneous coronary stent intervention (pci-s) N/A 01/20/2012    Procedure: PERCUTANEOUS CORONARY STENT INTERVENTION (PCI-S);  Surgeon: Sherren Mocha, MD;  Location: [facility] CATH LAB;  Service: Cardiovascular;  Laterality: N/A;   Family History  Problem Relation Age of Onset  . Heart disease Mother   . Cerebral palsy Daughter   . Hypertension Mother    Social History  Substance Use Topics  . Smoking status: Current Every Day Smoker -- 0.75 packs/day for 65 years    Types: Cigarettes  . Smokeless tobacco: Never Used  . Alcohol Use: Yes     Comment: "quit drinking in the 1990's"    Review of Systems  Constitutional: Negative for fever and chills.  Respiratory: Negative for shortness of breath.   Cardiovascular: Negative for chest pain.  Gastrointestinal: Positive for anal bleeding. Negative for nausea, vomiting, diarrhea and constipation.  Genitourinary:  Negative for dysuria.  Neurological: Positive for dizziness.  All other systems reviewed and are negative.     Allergies  Indomethacin; Lipitor; Pravachol; Procardia; and Zaroxolyn  Home Medications   Prior to Admission medications   Medication Sig Start Date End Date Taking? Authorizing Provider  allopurinol (ZYLOPRIM) 100 MG tablet Take 100 mg by mouth daily.  03/23/12   Historical Provider, MD  ALPRAZolam Duanne Moron) 0.25 MG tablet TAKE 1 TABLET BY MOUTH TWICE A DAY AS NEEDED FOR ANXIETY 07/20/15   Darlin Coco, MD  aspirin 81 MG tablet Take 81 mg by mouth daily.      Historical Provider, MD  calcium carbonate 200 MG  capsule Take 500 mg by mouth daily.      Historical Provider, MD  clopidogrel (PLAVIX) 75 MG tablet TAKE 1 TABLET (75 MG TOTAL) BY MOUTH DAILY. WITH BREAKFAST 11/06/15   Darlin Coco, MD  CVS D3 1000 UNITS capsule Take 1,000 Units by mouth daily. 09/11/14   Historical Provider, MD  fish oil-omega-3 fatty acids 1000 MG capsule Take 2 g by mouth daily.      Historical Provider, MD  furosemide (LASIX) 40 MG tablet Take 1 tablet (40 mg total) by mouth 2 (two) times daily. 09/24/15   Barton Dubois, MD  guaiFENesin (MUCINEX) 600 MG 12 hr tablet Take 1,200 mg by mouth 2 (two) times daily as needed for cough or to loosen phlegm.     Historical Provider, MD  ipratropium (ATROVENT) 0.02 % nebulizer solution Take 0.5 mg by nebulization 4 (four) times daily.    Historical Provider, MD  irbesartan (AVAPRO) 75 MG tablet TAKE 1 TABLET BY MOUTH EVERY DAY 12/22/15   Darlin Coco, MD  isosorbide mononitrate (IMDUR) 60 MG 24 hr tablet TAKE 1 TABLET (60 MG TOTAL) BY MOUTH 2 (TWO) TIMES DAILY. 10/11/15   Darlin Coco, MD  KLOR-CON M20 20 MEQ tablet TAKE 1 TABLET BY MOUTH TWICE A DAY 09/18/15   Darlin Coco, MD  levofloxacin (LEVAQUIN) 500 MG tablet Take 1 tablet (500 mg total) by mouth daily. 09/24/15   Barton Dubois, MD  meclizine (ANTIVERT) 25 MG tablet Take 1 tablet (25 mg total) by mouth 3 (three) times daily as needed for dizziness. Additional refills from PCP 09/05/15   Darlin Coco, MD  metoprolol (LOPRESSOR) 25 MG tablet Take 1 tablet (25 mg total) by mouth 2 (two) times daily. 07/26/15   Darlin Coco, MD  NITROSTAT 0.4 MG SL tablet PLACE 1 TABLET (0.4 MG TOTAL) UNDER THE TONGUE EVERY 5 (FIVE) MINUTES AS NEEDED. 04/28/15   Darlin Coco, MD  pantoprazole (PROTONIX) 40 MG tablet TAKE 1 TABLET BY MOUTH DAILY 12/12/15   Darlin Coco, MD  predniSONE (DELTASONE) 20 MG tablet Take 3 tablets by mouth daily X 2 days; then 2 tablets by mouth daily X 2 days; then 1 tablet by mouth daily X 2 days; then  1/2 tablet by mouth daily X 3 days and stop prednisone 09/24/15   Barton Dubois, MD  PROAIR HFA 108 [phone] BASE) MCG/ACT inhaler INHALE 2 PUFFS INTO LUNGS EVERY 6 HOURS AS NEEDED FOR WHEEZING 07/18/13   Darlin Coco, MD  ranitidine (ZANTAC) 150 MG capsule Take 150 mg by mouth at bedtime. 12/06/15   Historical Provider, MD  rosuvastatin (CRESTOR) 10 MG tablet TAKE 1 TABLET (10 MG TOTAL) BY MOUTH DAILY. 12/12/15   Darlin Coco, MD  sulfamethoxazole-trimethoprim (BACTRIM DS,SEPTRA DS) 800-160 MG tablet Take 1 tablet by mouth every 12 (twelve) hours. 10/05/15   Historical Provider,  MD  temazepam (RESTORIL) 30 MG capsule TAKE ONE CAPSULE BY MOUTH AT BEDTIME AS NEEDED 06/14/15   Darlin Coco, MD  traMADol (ULTRAM) 50 MG tablet Take 50 mg by mouth every 6 (six) hours as needed for moderate pain.  06/13/14   Historical Provider, MD   BP 127/77 mmHg  Pulse 70  Temp(Src) 98.5 F (36.9 C) (Oral)  Resp 26  SpO2 100% Physical Exam  Constitutional: He is oriented to person, place, and time. He appears well-developed and well-nourished.  HENT:  Head: Normocephalic and atraumatic.  Right Ear: External ear normal.  Left Ear: External ear normal.  Mouth/Throat: Oropharynx is clear and moist. No oropharyngeal exudate.  Swollen, erythematous turbinates, maxillary sinuses tender to palpation  Eyes: Conjunctivae and EOM are normal. Pupils are equal, round, and reactive to light. Right eye exhibits no discharge. Left eye exhibits no discharge. No scleral icterus.  Neck: Normal range of motion. Neck supple. No JVD present.  Cardiovascular: Normal rate, regular rhythm and normal heart sounds.  Exam reveals no gallop and no friction rub.   No murmur heard. Pulmonary/Chest: Effort normal and breath sounds normal. No respiratory distress. He has no wheezes. He has no rales. He exhibits no tenderness.  Abdominal: Soft. Bowel sounds are normal. He exhibits no distension and no mass. There is no tenderness. There is  no rebound and no guarding.  No focal abdominal tenderness, no RLQ tenderness or pain at McBurney's point, no RUQ tenderness or Murphy's sign, no left-sided abdominal tenderness, no fluid wave, or signs of peritonitis   Genitourinary:  No gross blood, soft dark stool, no hemorrhoids or fissures  Musculoskeletal: Normal range of motion. He exhibits no edema or tenderness.  Neurological: He is alert and oriented to person, place, and time.  Skin: Skin is warm and dry.  Psychiatric: He has a normal mood and affect. His behavior is normal. Judgment and thought content normal.  Nursing note and vitals reviewed.   ED Course  Procedures (including critical care time) Results for orders placed or performed during the hospital encounter of 12/24/15  Lipase, blood  Result Value Ref Range   Lipase 16 11 - 51 U/L  Comprehensive metabolic panel  Result Value Ref Range   Sodium 133 (L) 135 - 145 mmol/L   Potassium 3.4 (L) 3.5 - 5.1 mmol/L   Chloride 91 (L) 101 - 111 mmol/L   CO2 29 22 - 32 mmol/L   Glucose, Bld 148 (H) 65 - 99 mg/dL   BUN 21 (H) 6 - 20 mg/dL   Creatinine, Ser 1.20 0.61 - 1.24 mg/dL   Calcium 8.7 (L) 8.9 - 10.3 mg/dL   Total Protein 6.0 (L) 6.5 - 8.1 g/dL   Albumin 3.1 (L) 3.5 - 5.0 g/dL   AST 16 15 - 41 U/L   ALT 12 (L) 17 - 63 U/L   Alkaline Phosphatase 68 38 - 126 U/L   Total Bilirubin 1.0 0.3 - 1.2 mg/dL   GFR calc non Af Amer 55 (L) >60 mL/min   GFR calc Af Amer >60 >60 mL/min   Anion gap 13 5 - 15  CBC  Result Value Ref Range   WBC 12.7 (H) 4.0 - 10.5 K/uL   RBC 3.93 (L) 4.22 - 5.81 MIL/uL   Hemoglobin 13.6 13.0 - 17.0 g/dL   HCT 40.0 39.0 - 52.0 %   MCV 101.8 (H) 78.0 - 100.0 fL   MCH 34.6 (H) 26.0 - 34.0 pg   MCHC 34.0 30.0 -  36.0 g/dL   RDW 13.7 11.5 - 15.5 %   Platelets 205 150 - 400 K/uL  POC occult blood, ED Provider will collect  Result Value Ref Range   Fecal Occult Bld POSITIVE (A) NEGATIVE   No results found.  I have personally reviewed and  evaluated these images and lab results as part of my medical decision-making.   EKG Interpretation None      MDM   Final diagnoses:  Gastrointestinal hemorrhage, unspecified gastritis, unspecified gastrointestinal hemorrhage type    Patient with report of nausea, vomiting, and melena. He takes aspirin and Plavix. Vital signs are stable. No gross blood on digital rectal exam. He does not have any focal abdominal tenderness.  Hemoccult positive. Hemoglobin is 13.6, hematocrit 40. Mild leukocytosis to 12.7. Patient seen by and discussed with Dr. Johnney Killian. Plan for admission for GI bleed. Patient given Protonix. He has never been seen by gastroenterologist before.  Appreciate Dr. Tana Coast for admitting the patient. Recommend stepdown unit. Continue with Protonix drip and gentle rehydration. Dr. Tana Coast asked that I consult gastroenterology.  Patient is NPO.  7:59 PM Dr. Collene Mares from GI will consult in the morning.  Montine Circle, PA-C 12/24/15 2003  Charlesetta Shanks, MD 12/24/15 [phone]

## 2015-12-24 NOTE — ED Notes (Signed)
Admitting at bedside 

## 2015-12-24 NOTE — ED Notes (Signed)
Pt here for N,V,black stools. sts weak and dizzy.

## 2015-12-24 NOTE — H&P (Signed)
History and Physical        Hospital Admission Note Date: 12/24/2015  Patient name: Sean Figueroa Medical record number: [id number] Date of birth: 1933-02-18 Age: 82 y.o. Gender: male  PCP: [name], MD  Referring physician: Lorre Munroe  Chief Complaint:  Nausea, vomiting for last 6 days, dark stools for last 2 days  HPI: Patient is a 82 year old male with CAD, on dual antiplatelet therapy with aspirin and Plavix, hypertension, hyperlipidemia, GERD, COPD, tobacco abuse Presented to ED with nausea, vomiting, melanotic stools. History was obtained from the patient who reported that he's been having nausea and vomiting for the last 6 days and has been having difficulty keeping anything down. He has no abdominal pain, no hematemesis. He kept trying to keep himself hydrated by drinking a lot of fluids. Since yesterday patient noticed having dark melanotic stools. He takes aspirin and Plavix. He noticed dizziness since yesterday but no chest pain or shortness of breath. He denied any recent endoscopy procedures.  In ED patient was noted to have sodium of 133, potassium 3.4, creatinine 1.2, WBCs 12.7, hemoglobin 13.6, FOBT positive. MCV 101.8  No chest x-ray available.   Review of Systems:  Constitutional: Denies fever, chills, diaphoresis, poor appetite and fatigue.  HEENT: Denies photophobia, eye pain, redness, hearing loss, ear pain, congestion, sore throat, rhinorrhea, sneezing, mouth sores, trouble swallowing, neck pain, neck stiffness and tinnitus.   Respiratory: Denies SOB, DOE, cough, chest tightness,  and wheezing.   Cardiovascular: Denies chest pain, palpitations and leg swelling.  Gastrointestinal: Please see history of present illness  Genitourinary: Denies dysuria, urgency, frequency, hematuria, flank pain and difficulty urinating.  Musculoskeletal: Denies myalgias, back  pain, joint swelling, arthralgias and gait problem.  Skin: Denies pallor, rash and wound.  Neurological:+ dizziness, seizures, syncope, weakness, numbness and headaches.  Hematological: Denies adenopathy. Easy bruising, personal or family bleeding history  Psychiatric/Behavioral: Denies suicidal ideation, mood changes, confusion, nervousness, sleep disturbance and agitation  Past Medical History: Past Medical History  Diagnosis Date  . 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 Has chronic angina; S/P DES to the LCX and DES to the L MAIN 12/2011. Now on Plavix and ASA.   Marland Kitchen Hyperlipidemia   . Hypertension   . Long-term (current) use of anticoagulants     For hx of LV apical aneurysm  . LVH (left ventricular hypertrophy)   . Atrial fibrillation ([place])     Unclear history  . Tobacco abuse   . Systolic heart failure     EF is 35% per cath 12/2011  . Myocardial infarction [facility]) 1981    [name] 01/14/2012  . Left ventricular apical thrombus ([place])     hx/notes 10/06/2014  . On home oxygen therapy     "2.5L 24/7" (10/06/2014)  . COPD (chronic obstructive pulmonary disease) ([place])   . Pneumonia "several times"  . Arthritis     "shoulders; back" (10/06/2014)  . History of gout   . Chronic back pain     "anytime it's wet and cold" (10/06/2014)  . Nocturia   .  Syncopal episodes     Past Surgical History  Procedure Laterality Date  . Cholecystectomy    . Hernia repair    . Coronary angioplasty with stent placement  12/2011    LCX and L main - drug eluting  . Cardiac catheterization  1981    [name] 01/14/2012  . Left heart catheterization with coronary angiogram N/A 01/16/2012    Procedure: LEFT HEART CATHETERIZATION WITH CORONARY ANGIOGRAM;  Surgeon: Sherren Mocha, MD;  Location: [facility] CATH LAB;  Service: Cardiovascular;  Laterality: N/A;  . Percutaneous coronary stent intervention  (pci-s) N/A 01/20/2012    Procedure: PERCUTANEOUS CORONARY STENT INTERVENTION (PCI-S);  Surgeon: Sherren Mocha, MD;  Location: [facility] CATH LAB;  Service: Cardiovascular;  Laterality: N/A;    Medications: Prior to Admission medications   Medication Sig Start Date End Date Taking? Authorizing Provider  allopurinol (ZYLOPRIM) 100 MG tablet Take 100 mg by mouth daily.  03/23/12  Yes Historical Provider, MD  ALPRAZolam Duanne Moron) 0.25 MG tablet TAKE 1 TABLET BY MOUTH TWICE A DAY AS NEEDED FOR ANXIETY 07/20/15  Yes Darlin Coco, MD  aspirin EC 81 MG tablet Take 81 mg by mouth daily.   Yes Historical Provider, MD  CALCIUM PO Take 1 tablet by mouth daily.   Yes Historical Provider, MD  clopidogrel (PLAVIX) 75 MG tablet TAKE 1 TABLET (75 MG TOTAL) BY MOUTH DAILY. WITH BREAKFAST 11/06/15  Yes Darlin Coco, MD  [employer] D3 1000 UNITS capsule Take 1,000 Units by mouth daily. 09/11/14  Yes Historical Provider, MD  fish oil-omega-3 fatty acids 1000 MG capsule Take 1 g by mouth 2 (two) times daily.    Yes Historical Provider, MD  furosemide (LASIX) 40 MG tablet Take 1 tablet (40 mg total) by mouth 2 (two) times daily. 09/24/15  Yes Barton Dubois, MD  guaiFENesin (MUCINEX) 600 MG 12 hr tablet Take 1,200 mg by mouth 2 (two) times daily as needed for cough or to loosen phlegm.    Yes Historical Provider, MD  ipratropium (ATROVENT) 0.02 % nebulizer solution Take 0.5 mg by nebulization 4 (four) times daily.   Yes Historical Provider, MD  irbesartan (AVAPRO) 75 MG tablet TAKE 1 TABLET BY MOUTH EVERY DAY 12/22/15  Yes Darlin Coco, MD  isosorbide mononitrate (IMDUR) 60 MG 24 hr tablet TAKE 1 TABLET (60 MG TOTAL) BY MOUTH 2 (TWO) TIMES DAILY. 10/11/15  Yes Darlin Coco, MD  KLOR-CON M20 20 MEQ tablet TAKE 1 TABLET BY MOUTH TWICE A DAY 09/18/15  Yes Darlin Coco, MD  meclizine (ANTIVERT) 25 MG tablet Take 1 tablet (25 mg total) by mouth 3 (three) times daily as needed for dizziness. Additional refills from PCP 09/05/15   Yes Darlin Coco, MD  metoprolol (LOPRESSOR) 25 MG tablet Take 1 tablet (25 mg total) by mouth 2 (two) times daily. 07/26/15  Yes Darlin Coco, MD  NITROSTAT 0.4 MG SL tablet PLACE 1 TABLET (0.4 MG TOTAL) UNDER THE TONGUE EVERY 5 (FIVE) MINUTES AS NEEDED. Patient taking differently: PLACE 1 TABLET (0.4 MG TOTAL) UNDER THE TONGUE EVERY 5 (FIVE) MINUTES AS NEEDED FOR CHEST PAIN 04/28/15  Yes Darlin Coco, MD  OXYGEN Inhale into the lungs continuous.   Yes Historical Provider, MD  pantoprazole (PROTONIX) 40 MG tablet TAKE 1 TABLET BY MOUTH DAILY 12/12/15  Yes Darlin Coco, MD  pramipexole (MIRAPEX) 0.125 MG tablet Take 0.125 mg by mouth at bedtime. 12/12/15  Yes Historical Provider, MD  PROAIR HFA 108 (90 BASE) MCG/ACT inhaler INHALE 2 PUFFS INTO LUNGS EVERY 6 HOURS  AS NEEDED FOR WHEEZING 07/18/13  Yes Darlin Coco, MD  ranitidine (ZANTAC) 150 MG capsule Take 150 mg by mouth at bedtime. 12/06/15  Yes Historical Provider, MD  rosuvastatin (CRESTOR) 10 MG tablet TAKE 1 TABLET (10 MG TOTAL) BY MOUTH DAILY. 12/12/15  Yes Darlin Coco, MD  temazepam (RESTORIL) 30 MG capsule TAKE ONE CAPSULE BY MOUTH AT BEDTIME AS NEEDED Patient taking differently: TAKE ONE CAPSULE BY MOUTH AT BEDTIME AS NEEDED FOR SLEEP 06/14/15  Yes Darlin Coco, MD  traMADol (ULTRAM) 50 MG tablet Take 50 mg by mouth every 6 (six) hours as needed for moderate pain.  06/13/14  Yes Historical Provider, MD    Allergies:   Allergies  Allergen Reactions  . Indomethacin Other (See Comments)    Unknown reaction  . Lipitor [Atorvastatin Calcium] Other (See Comments)    Leg pain  . Pravachol Other (See Comments)    Leg pain  . Procardia [Nifedipine] Other (See Comments)    Reaction unknown  . Zaroxolyn [Metolazone] Other (See Comments)    Reaction unknown    Social History:  Patient reports smoking at least half pack per day and 3-5 packs in a week..  He has a 48.75 pack-year smoking history. He has never used  smokeless tobacco. He reported to me that he has not been drinking any alcohol.  He reports that he does not use illicit drugs.he lives at home with his son.   Family History: Family History  Problem Relation Age of Onset  . Heart disease Mother   . Cerebral palsy Daughter   . Hypertension Mother     Physical Exam: Blood pressure 119/66, pulse 74, temperature 98.5 F (36.9 C), temperature source Oral, resp. rate 20, SpO2 100 %. General: Alert, awake, oriented x3, in no acute distress. HEENT: normocephalic, atraumatic, anicteric sclera, pink conjunctiva, pupils equal and reactive to light and accomodation, oropharynx clear Neck: supple, no masses or lymphadenopathy, no goiter, no bruits  Heart: Regular rate and rhythm, without murmurs, rubs or gallops. Lungs: Clear to auscultation bilaterally, no wheezing, rales or rhonchi. Abdomen: Soft, nontender, nondistended, positive bowel sounds, no masses. Extremities: No clubbing, cyanosis or edema with positive pedal pulses. Neuro: Grossly intact, no focal neurological deficits, strength 5/5 upper and lower extremities bilaterally Psych: alert and oriented x 3, normal mood and affect Skin: no rashes or lesions, warm and dry   LABS on Admission:  Basic Metabolic Panel:  Recent Labs Lab 12/24/15 1540  NA 133*  K 3.4*  CL 91*  CO2 29  GLUCOSE 148*  BUN 21*  CREATININE 1.20  CALCIUM 8.7*   Liver Function Tests:  Recent Labs Lab 12/24/15 1540  AST 16  ALT 12*  ALKPHOS 68  BILITOT 1.0  PROT 6.0*  ALBUMIN 3.1*    Recent Labs Lab 12/24/15 1540  LIPASE 16   No results for input(s): AMMONIA in the last 168 hours. CBC:  Recent Labs Lab 12/24/15 1540  WBC 12.7*  HGB 13.6  HCT 40.0  MCV 101.8*  PLT 205   Cardiac Enzymes: No results for input(s): CKTOTAL, CKMB, CKMBINDEX, TROPONINI in the last 168 hours. BNP: Invalid input(s): POCBNP CBG: No results for input(s): GLUCAP in the last 168 hours.  Radiological Exams  on Admission:  No results found.  *I have personally reviewed the images above*  EKG: Independently reviewed. Rate 66, normal sinus rhythm, no acute ST-T wave changes suggestive of ischemia   Assessment/Plan Principal Problem:   Nausea, vomiting with GI bleed: Has underlying  history of GERD, on dual antiplatelet therapy, denies any recent NSAID use - Admit to stepdown unit, currently BP on softer side - Hold aspirin and Plavix - Patient started on IV Protonix drip, NPO status, serial H&H - EDP calling GI on call  Active Problems:   Tobacco abuse - Patient counseled strongly on tobacco cessation, placed on nicotine patch    Hypokalemia - Replaced IV  (HCC)/ischemic heart disease, MI, DES in 2013 to circumflex and left main - Hold aspirin, Plavix, - Due to borderline BP, hold beta blocker,ARB, Imdur, Lasix for now    COPD, severe ([place]) - Patient still continues to smoke, recommended strongly to quit smoking - Placed on O2, DuoNeb nebs as needed    Chronic systolic heart failure ([place]) - 2-D echo 11/15 had shown EF of [id number], grade 1 diastolic dysfunction -Currently BP borderline low, hold aspirin, Plavix, metoprolol, Avapro, Imdur, Lasix    GERD (gastroesophageal reflux disease) - Continue PPI    Leukocytosis - Currently does not have any fevers, monitor closely for any aspiration due to nausea, vomiting  - If patient spikes any fevers, will obtain blood cultures and chest x-ray   Deconditioning - PTOT evaluation prior to discharge  DVT prophylaxis:  SCDs   CODE STATUS:  discussed in detail with the patient, he opted to be full CODE STATUS   Family Communication: Admission, patients condition and plan of care including tests being ordered have been discussed with the patient who indicates understanding and agree with the plan and Code Status   Time Spent on Admission: 83 minutes   Jaxten Brosh M.D. [facility] 12/24/2015, 6:47 PM Pager: [phone]  If  7PM-7AM, please contact night-coverage [email or web address] Password [email or web address]

## 2015-12-25 ENCOUNTER — Inpatient Hospital Stay (HOSPITAL_COMMUNITY): Payer: Medicare Other

## 2015-12-25 DIAGNOSIS — R112 Nausea with vomiting, unspecified: Secondary | ICD-10-CM

## 2015-12-25 LAB — CBC WITH DIFFERENTIAL/PLATELET
BASOS ABS: 0 10*3/uL (ref 0.0–0.1)
BASOS PCT: 0 %
EOS PCT: 1 %
Eosinophils Absolute: 0.2 10*3/uL (ref 0.0–0.7)
HCT: 37.2 % — ABNORMAL LOW (ref 39.0–52.0)
Hemoglobin: 12.5 g/dL — ABNORMAL LOW (ref 13.0–17.0)
LYMPHS ABS: 1.9 10*3/uL (ref 0.7–4.0)
LYMPHS PCT: 17 %
MCH: 34.4 pg — ABNORMAL HIGH (ref 26.0–34.0)
MCHC: 33.6 g/dL (ref 30.0–36.0)
MCV: 102.5 fL — AB (ref 78.0–100.0)
MONO ABS: 0.9 10*3/uL (ref 0.1–1.0)
MONOS PCT: 8 %
Neutro Abs: 8.4 10*3/uL — ABNORMAL HIGH (ref 1.7–7.7)
Neutrophils Relative %: 74 %
PLATELETS: 179 10*3/uL (ref 150–400)
RBC: 3.63 MIL/uL — ABNORMAL LOW (ref 4.22–5.81)
RDW: 13.8 % (ref 11.5–15.5)
WBC: 11.3 10*3/uL — AB (ref 4.0–10.5)

## 2015-12-25 LAB — BASIC METABOLIC PANEL
ANION GAP: 9 (ref 5–15)
BUN: 14 mg/dL (ref 6–20)
CALCIUM: 8.2 mg/dL — AB (ref 8.9–10.3)
CO2: 28 mmol/L (ref 22–32)
CREATININE: 0.93 mg/dL (ref 0.61–1.24)
Chloride: 100 mmol/L — ABNORMAL LOW (ref 101–111)
GLUCOSE: 110 mg/dL — AB (ref 65–99)
Potassium: 3.2 mmol/L — ABNORMAL LOW (ref 3.5–5.1)
Sodium: 137 mmol/L (ref 135–145)

## 2015-12-25 LAB — HEMOGLOBIN AND HEMATOCRIT, BLOOD
HEMATOCRIT: 38.7 % — AB (ref 39.0–52.0)
HEMATOCRIT: 37.3 % — AB (ref 39.0–52.0)
HEMOGLOBIN: 12.1 g/dL — AB (ref 13.0–17.0)
Hemoglobin: 12.9 g/dL — ABNORMAL LOW (ref 13.0–17.0)

## 2015-12-25 LAB — MRSA PCR SCREENING: MRSA by PCR: NEGATIVE

## 2015-12-25 MED ORDER — HYDROMORPHONE HCL 1 MG/ML IJ SOLN
1.0000 mg | INTRAMUSCULAR | Status: DC | PRN
  Administered 2015-12-31: 1 mg via INTRAVENOUS
  Filled 2015-12-25: qty 1

## 2015-12-25 MED ORDER — IPRATROPIUM BROMIDE 0.02 % IN SOLN
0.5000 mg | Freq: Four times a day (QID) | RESPIRATORY_TRACT | Status: DC
  Filled 2015-12-25: qty 2.5

## 2015-12-25 MED ORDER — ACETAMINOPHEN 650 MG RE SUPP: 650.0000 mg | Freq: Four times a day (QID) | RECTAL | Status: DC | PRN

## 2015-12-25 MED ORDER — ONDANSETRON HCL 4 MG PO TABS: 4.0000 mg | ORAL_TABLET | Freq: Four times a day (QID) | ORAL | Status: DC | PRN

## 2015-12-25 MED ORDER — ALPRAZOLAM 0.25 MG PO TABS
0.2500 mg | ORAL_TABLET | Freq: Two times a day (BID) | ORAL | Status: DC | PRN
  Administered 2015-12-25 – 2015-12-31 (×8): 0.25 mg via ORAL
  Filled 2015-12-25 (×8): qty 1

## 2015-12-25 MED ORDER — ACETAMINOPHEN 325 MG PO TABS: 650.0000 mg | ORAL_TABLET | Freq: Four times a day (QID) | ORAL | Status: DC | PRN

## 2015-12-25 MED ORDER — GUAIFENESIN-DM 100-10 MG/5ML PO SYRP
5.0000 mL | ORAL_SOLUTION | ORAL | Status: DC | PRN
  Administered 2015-12-26 (×2): 5 mL via ORAL
  Filled 2015-12-25 (×3): qty 5

## 2015-12-25 MED ORDER — PANTOPRAZOLE SODIUM 40 MG PO TBEC
40.0000 mg | DELAYED_RELEASE_TABLET | Freq: Two times a day (BID) | ORAL | Status: DC
  Administered 2015-12-25 – 2015-12-29 (×6): 40 mg via ORAL
  Filled 2015-12-25 (×7): qty 1

## 2015-12-25 MED ORDER — SODIUM CHLORIDE 0.9 % IV SOLN
INTRAVENOUS | Status: DC
  Administered 2015-12-25 – 2015-12-26 (×2): via INTRAVENOUS

## 2015-12-25 MED ORDER — IPRATROPIUM BROMIDE 0.02 % IN SOLN
0.5000 mg | Freq: Four times a day (QID) | RESPIRATORY_TRACT | Status: DC
  Administered 2015-12-25 (×3): 0.5 mg via RESPIRATORY_TRACT
  Filled 2015-12-25 (×3): qty 2.5

## 2015-12-25 MED ORDER — POTASSIUM CHLORIDE 10 MEQ/100ML IV SOLN
10.0000 meq | INTRAVENOUS | Status: AC
  Administered 2015-12-25 (×3): 10 meq via INTRAVENOUS
  Filled 2015-12-25 (×3): qty 100

## 2015-12-25 MED ORDER — ASPIRIN EC 81 MG PO TBEC
81.0000 mg | DELAYED_RELEASE_TABLET | Freq: Every day | ORAL | Status: DC
  Administered 2015-12-25 – 2016-01-01 (×7): 81 mg via ORAL
  Filled 2015-12-25 (×7): qty 1

## 2015-12-25 MED ORDER — NICOTINE 21 MG/24HR TD PT24
21.0000 mg | MEDICATED_PATCH | Freq: Every day | TRANSDERMAL | Status: DC
  Administered 2015-12-25 – 2016-01-01 (×8): 21 mg via TRANSDERMAL
  Filled 2015-12-25 (×8): qty 1

## 2015-12-25 MED ORDER — ONDANSETRON HCL 4 MG/2ML IJ SOLN: 4.0000 mg | Freq: Four times a day (QID) | INTRAMUSCULAR | Status: DC | PRN

## 2015-12-25 MED ORDER — MECLIZINE HCL 25 MG PO TABS
25.0000 mg | ORAL_TABLET | Freq: Three times a day (TID) | ORAL | Status: DC | PRN
  Filled 2015-12-25: qty 1

## 2015-12-25 MED ORDER — PRAMIPEXOLE DIHYDROCHLORIDE 0.125 MG PO TABS
0.1250 mg | ORAL_TABLET | Freq: Every day | ORAL | Status: DC
  Administered 2015-12-25 – 2015-12-31 (×6): 0.125 mg via ORAL
  Filled 2015-12-25 (×10): qty 1

## 2015-12-25 MED ORDER — IPRATROPIUM-ALBUTEROL 0.5-2.5 (3) MG/3ML IN SOLN
3.0000 mL | RESPIRATORY_TRACT | Status: DC | PRN
  Administered 2015-12-25 – 2015-12-26 (×2): 3 mL via RESPIRATORY_TRACT
  Filled 2015-12-25 (×2): qty 3

## 2015-12-25 NOTE — Consult Note (Addendum)
Referring Provider: [facility] Primary Care Physician:  [name], MD Primary Gastroenterologist:  unassigned  Reason for Consultation:  Melena, nausea, dysphagia    HPI: Sean Figueroa is a 82 y.o. male with a history of ischemic heart disease who has a history of remote MI. He underwent a high risk drug-eluting stent placement to the circumflex into the left main and 2013. He is on long-term stool antiplatelet therapy with aspirin and Plavix. Other past medical history significant for hypertension, hyperlipidemia, left ventricular hypertrophy, atrial fibrillation, systolic heart failure with EF 35% per calf in 2013, left ventricular apical thrombus, COPD on home oxygen, arthritis, gout, and chronic back pain. He presented to the emergency room yesterday with complaints of 6 days of nausea, vomiting, and 2 days of dark black stools. Patient reports that he had been having nausea and vomiting for 6 days and was unable to keep anything down. Further questioning reveals that he feels food is getting hung up in the mid chest and coming right back up. He has no abdominal pain. He had been having reflux for a few weeks and was recently started on ranitidine. He feels this is decreased his reflux but he continues to have difficulty keeping anything down. On the fourth he began having dark melenic stools and was dizzy on the day of admission. He denies shortness of breath chest pain. He reports that he had a colonoscopy many years ago somewhere in [place] but he does not remember the endoscopist. In the ER he was found to have a hemoglobin of 13.6 and was FOBT positive. MCV was 101.8.   Past Medical History  Diagnosis Date  . 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 Has chronic angina; S/P DES to the LCX and DES to the L MAIN 12/2011. Now on Plavix and ASA.   Marland Kitchen Hyperlipidemia   .  Hypertension   . Long-term (current) use of anticoagulants     For hx of LV apical aneurysm  . LVH (left ventricular hypertrophy)   . Atrial fibrillation ([place])     Unclear history  . Tobacco abuse   . Systolic heart failure     EF is 35% per cath 12/2011  . Myocardial infarction [facility]) 1981    [name] 01/14/2012  . Left ventricular apical thrombus ([place])     hx/notes 10/06/2014  . On home oxygen therapy     "2.5L 24/7" (10/06/2014)  . COPD (chronic obstructive pulmonary disease) ([place])   . Pneumonia "several times"  . Arthritis     "shoulders; back" (10/06/2014)  . History of gout   . Chronic back pain     "anytime it's wet and cold" (10/06/2014)  . Nocturia   . Syncopal episodes     Past Surgical History  Procedure Laterality Date  . Cholecystectomy    . Hernia repair    . Coronary angioplasty with stent placement  12/2011    LCX and L main - drug eluting  . Cardiac catheterization  1981    [name] 01/14/2012  . Left heart catheterization with coronary angiogram N/A 01/16/2012    Procedure: LEFT HEART CATHETERIZATION WITH CORONARY ANGIOGRAM;  Surgeon: Sherren Mocha, MD;  Location: [facility] CATH LAB;  Service: Cardiovascular;  Laterality: N/A;  . Percutaneous coronary stent intervention (pci-s) N/A 01/20/2012    Procedure: PERCUTANEOUS CORONARY STENT INTERVENTION (PCI-S);  Surgeon: Sherren Mocha, MD;  Location:  [facility] CATH LAB;  Service: Cardiovascular;  Laterality: N/A;    Prior to Admission medications   Medication Sig Start Date End Date Taking? Authorizing Provider  allopurinol (ZYLOPRIM) 100 MG tablet Take 100 mg by mouth daily.  03/23/12  Yes Historical Provider, MD  ALPRAZolam Duanne Moron) 0.25 MG tablet TAKE 1 TABLET BY MOUTH TWICE A DAY AS NEEDED FOR ANXIETY 07/20/15  Yes Darlin Coco, MD  aspirin EC 81 MG tablet Take 81 mg by mouth daily.   Yes Historical Provider, MD  CALCIUM PO Take 1 tablet by mouth daily.   Yes Historical Provider, MD  clopidogrel (PLAVIX) 75 MG tablet TAKE 1  TABLET (75 MG TOTAL) BY MOUTH DAILY. WITH BREAKFAST 11/06/15  Yes Darlin Coco, MD  [employer] D3 1000 UNITS capsule Take 1,000 Units by mouth daily. 09/11/14  Yes Historical Provider, MD  fish oil-omega-3 fatty acids 1000 MG capsule Take 1 g by mouth 2 (two) times daily.    Yes Historical Provider, MD  furosemide (LASIX) 40 MG tablet Take 1 tablet (40 mg total) by mouth 2 (two) times daily. 09/24/15  Yes Barton Dubois, MD  guaiFENesin (MUCINEX) 600 MG 12 hr tablet Take 1,200 mg by mouth 2 (two) times daily as needed for cough or to loosen phlegm.    Yes Historical Provider, MD  ipratropium (ATROVENT) 0.02 % nebulizer solution Take 0.5 mg by nebulization 4 (four) times daily.   Yes Historical Provider, MD  irbesartan (AVAPRO) 75 MG tablet TAKE 1 TABLET BY MOUTH EVERY DAY 12/22/15  Yes Darlin Coco, MD  isosorbide mononitrate (IMDUR) 60 MG 24 hr tablet TAKE 1 TABLET (60 MG TOTAL) BY MOUTH 2 (TWO) TIMES DAILY. 10/11/15  Yes Darlin Coco, MD  KLOR-CON M20 20 MEQ tablet TAKE 1 TABLET BY MOUTH TWICE A DAY 09/18/15  Yes Darlin Coco, MD  meclizine (ANTIVERT) 25 MG tablet Take 1 tablet (25 mg total) by mouth 3 (three) times daily as needed for dizziness. Additional refills from PCP 09/05/15  Yes Darlin Coco, MD  metoprolol (LOPRESSOR) 25 MG tablet Take 1 tablet (25 mg total) by mouth 2 (two) times daily. 07/26/15  Yes Darlin Coco, MD  NITROSTAT 0.4 MG SL tablet PLACE 1 TABLET (0.4 MG TOTAL) UNDER THE TONGUE EVERY 5 (FIVE) MINUTES AS NEEDED. Patient taking differently: PLACE 1 TABLET (0.4 MG TOTAL) UNDER THE TONGUE EVERY 5 (FIVE) MINUTES AS NEEDED FOR CHEST PAIN 04/28/15  Yes Darlin Coco, MD  OXYGEN Inhale into the lungs continuous.   Yes Historical Provider, MD  pantoprazole (PROTONIX) 40 MG tablet TAKE 1 TABLET BY MOUTH DAILY 12/12/15  Yes Darlin Coco, MD  pramipexole (MIRAPEX) 0.125 MG tablet Take 0.125 mg by mouth at bedtime. 12/12/15  Yes Historical Provider, MD  PROAIR HFA 108 (90  BASE) MCG/ACT inhaler INHALE 2 PUFFS INTO LUNGS EVERY 6 HOURS AS NEEDED FOR WHEEZING 07/18/13  Yes Darlin Coco, MD  ranitidine (ZANTAC) 150 MG capsule Take 150 mg by mouth at bedtime. 12/06/15  Yes Historical Provider, MD  rosuvastatin (CRESTOR) 10 MG tablet TAKE 1 TABLET (10 MG TOTAL) BY MOUTH DAILY. 12/12/15  Yes Darlin Coco, MD  temazepam (RESTORIL) 30 MG capsule TAKE ONE CAPSULE BY MOUTH AT BEDTIME AS NEEDED Patient taking differently: TAKE ONE CAPSULE BY MOUTH AT BEDTIME AS NEEDED FOR SLEEP 06/14/15  Yes Darlin Coco, MD  traMADol (ULTRAM) 50 MG tablet Take 50 mg by mouth every 6 (six) hours as needed for moderate pain.  06/13/14  Yes Historical Provider, MD    Current  Facility-Administered Medications  Medication Dose Route Frequency Provider Last Rate Last Dose  . 0.9 %  sodium chloride infusion   Intravenous Continuous Ripudeep Krystal Eaton, MD 75 mL/hr at 12/25/15 1045    . acetaminophen (TYLENOL) tablet 650 mg  650 mg Oral Q6H PRN Ripudeep Krystal Eaton, MD       Or  . acetaminophen (TYLENOL) suppository 650 mg  650 mg Rectal Q6H PRN Ripudeep Krystal Eaton, MD      . ALPRAZolam Duanne Moron) tablet 0.25 mg  0.25 mg Oral BID PRN Ripudeep K Rai, MD      . guaiFENesin-dextromethorphan (ROBITUSSIN DM) 100-10 MG/5ML syrup 5 mL  5 mL Oral Q4H PRN Ripudeep K Rai, MD      . HYDROmorphone (DILAUDID) injection 1 mg  1 mg Intravenous Q4H PRN Ripudeep K Rai, MD      . ipratropium (ATROVENT) nebulizer solution 0.5 mg  0.5 mg Nebulization Q6H Ripudeep K Rai, MD   0.5 mg at 12/25/15 0714  . ipratropium-albuterol (DUONEB) 0.5-2.5 (3) MG/3ML nebulizer solution 3 mL  3 mL Nebulization Q4H PRN Ripudeep Krystal Eaton, MD      . meclizine (ANTIVERT) tablet 25 mg  25 mg Oral TID PRN Ripudeep Krystal Eaton, MD      . nicotine (NICODERM CQ - dosed in mg/24 hours) patch 21 mg  21 mg Transdermal Daily Ripudeep K Rai, MD   21 mg at 12/25/15 1045  . ondansetron (ZOFRAN) tablet 4 mg  4 mg Oral Q6H PRN Ripudeep Krystal Eaton, MD       Or  . ondansetron  (ZOFRAN) injection 4 mg  4 mg Intravenous Q6H PRN Ripudeep K Rai, MD      . pantoprazole (PROTONIX) 80 mg in sodium chloride 0.9 % 250 mL (0.32 mg/mL) infusion  8 mg/hr Intravenous Continuous Montine Circle, PA-C 25 mL/hr at 12/25/15 1045 8 mg/hr at 12/25/15 1045  . pantoprazole (PROTONIX) injection 40 mg  40 mg Intravenous Q12H Montine Circle, PA-C   40 mg at 12/25/15 1045  . potassium chloride 10 mEq in 100 mL IVPB  10 mEq Intravenous Q1 Hr x 3 Ripudeep K Rai, MD   10 mEq at 12/25/15 1146  . pramipexole (MIRAPEX) tablet 0.125 mg  0.125 mg Oral QHS Ripudeep Krystal Eaton, MD        Allergies as of 12/24/2015 - Review Complete 12/24/2015  Allergen Reaction Noted  . Indomethacin Other (See Comments) 02/27/2011  . Lipitor [atorvastatin calcium] Other (See Comments) 02/27/2011  . Pravachol Other (See Comments) 02/27/2011  . Procardia [nifedipine] Other (See Comments) 02/27/2011  . Zaroxolyn [metolazone] Other (See Comments) 02/27/2011    Family History  Problem Relation Age of Onset  . Heart disease Mother   . Cerebral palsy Daughter   . Hypertension Mother     Social History   Social History  . Marital Status: Widowed    Spouse Name: N/A  . Number of Children: N/A  . Years of Education: N/A   Occupational History  . Not on file.   Social History Main Topics  . Smoking status: Current Every Day Smoker -- 0.75 packs/day for 65 years    Types: Cigarettes  . Smokeless tobacco: Never Used  . Alcohol Use: Yes     Comment: "quit drinking in the 1990's"  . Drug Use: No  . Sexual Activity: Not Currently   Other Topics Concern  . Not on file   Social History Narrative    Review of Systems: Gen: Denies any  fever, chills, sweats, anorexia, fatigue, weakness, malaise, weight loss, and sleep disorder CV: Denies chest pain, angina, palpitations, syncope, orthopnea, PND, peripheral edema, and claudication. Resp: Denies dyspnea at rest, dyspnea with exercise, cough, sputum, wheezing,  coughing up blood, and pleurisy. GI: Admits to nausea and vomiting of 6 days duration with dysphagia of one to 2 weeks. Admits to black stools 2 days. GU : Denies urinary burning, blood in urine, urinary frequency, urinary hesitancy, nocturnal urination, and urinary incontinence. MS: Denies joint pain, limitation of movement, and swelling, stiffness, low back pain, extremity pain. Denies muscle weakness, cramps, atrophy.  Derm: Denies rash, itching, dry skin, hives, moles, warts, or unhealing ulcers.  Psych: Denies depression, anxiety, memory loss, suicidal ideation, hallucinations, paranoia, and confusion. Heme: Denies bruising, bleeding, and enlarged lymph nodes. Neuro:  Denies any headaches,  paresthesias. Admits to dizziness the day of admission. Endo:  Denies any problems with DM, thyroid, adrenal function.  Physical Exam: Vital signs in last 24 hours: Temp:  [98.3 F (36.8 C)-98.5 F (36.9 C)] 98.3 F (36.8 C) (02/06 0822) Pulse Rate:  [41-93] 93 (02/06 0822) Resp:  [16-31] 31 (02/06 0822) BP: (98-141)/(52-99) 123/76 mmHg (02/06 0822) SpO2:  [92 %-100 %] 94 % (02/06 0822) FiO2 (%):  [32 %] 32 % (02/06 0716) Weight:  [151 lb 6.4 oz (68.675 kg)] 151 lb 6.4 oz (68.675 kg) (02/06 [id number])   General:   Alert,  Well-developed, well-nourished, pleasant and cooperative in NAD, hard of hearing Head:  Normocephalic and atraumatic. Eyes:  Sclera clear, no icterus.   Conjunctiva pink. Ears:  Normal auditory acuity. Nose:  No deformity, discharge,  or lesions. Mouth:  No deformity or lesions.   Neck:  Supple; no masses or thyromegaly. Lungs:  Clear throughout to auscultation.     Heart:  Regular rate and rhythm Abdomen:  Soft,nontender, BS active,nonpalp mass or hsm.   Rectal:  Deferred  Msk:  Symmetrical without gross deformities. . Pulses:  Normal pulses noted. Extremities:  Without clubbing or edema. Erythema distal third bilateral anterior tibias Neurologic:  Alert and  oriented x4;   grossly normal neurologically.Marland Kitchen Psych:  Alert and cooperative. Normal mood and affect.  Intake/Output from previous day: 02/05 0701 - 02/06 0700 In: 1000 [I.V.:1000] Out: 11 [Urine:50] Intake/Output this shift: Total I/O In: -  Out: 125 [Urine:125]  Lab Results:  Recent Labs  12/24/15 1540 12/25/15 0425  WBC 12.7* 11.3*  HGB 13.6 12.5*  HCT 40.0 37.2*  PLT 205 179   BMET  Recent Labs  12/24/15 1540 12/25/15 0624  NA 133* 137  K 3.4* 3.2*  CL 91* 100*  CO2 29 28  GLUCOSE 148* 110*  BUN 21* 14  CREATININE 1.20 0.93  CALCIUM 8.7* 8.2*   LFT  Recent Labs  12/24/15 1540  PROT 6.0*  ALBUMIN 3.1*  AST 16  ALT 12*  ALKPHOS 68  BILITOT 1.0   PT/INR  Recent Labs  12/24/15 1730  LABPROT 14.0  INR 1.06   IMPRESSION/PLAN: 82 year old male with a history of ischemic heart disease, MI, DES in 2013 on dual therapy with aspirin and Plavix, admitted with nausea, vomiting, and melena. Patient reports that he feels his food gets to the mid to distal esophagus comes right back up. Would hold Plavix, continue IV Protonix, trend H&H. Will schedule for an esophagram with barium tablet due to complaints of dysphagia. Will likely need EGD later this week to eval for esophagitis, gastritis, ulcer, etc., after Plavix has washed out.  Chronic  systolic heart failure Leukocytosis GERD COPD Tobacco abuse Hypokalemia  Hvozdovic, Vita Barley PA-C 12/25/2015,  Pager [phone]  Mon-Fri 8a-5p [phone] after 5p, weekends, holidays   Attending physician's note   I have taken a history, examined the patient and reviewed the chart. I agree with the Advanced Practitioner's note, impression and recommendations. 82 year old male with severe COPD on 2-3 L home oxygen, ischemic heart disease status post drug eluting stents on aspirin and Plavix, CHF admitted with melena and vomiting. Patient also reports regurgitating food with some difficulty swallowing. He is currently hemodynamically stable.  Did not have any significant drop in Hgb. Transfuse as needed to maintain hemoglobin greater than 7. Obtain barium esophagram to evaluate for possible esophageal stricture.  We'll plan for EGD later this week for evaluation of source of possible upper GI bleed and may also consider dilation based on esophagram findings. ?gastritis/mallory weis tear, esophagitis or peptic ulcer disease likely etiology of Melena Hold Plavix, ok to continue low-dose aspirin PPI PO BID Clears today and if Hgb remains stable can advance to full liquid diet tomorrow We'll continue to follow, please call with any questions  K Denzil Magnuson, MD [phone] Mon-Fri 8a-5p [phone] after 5p, weekends, holidays  Addendum  Reviewed findings of barium esophagogram concerning for GE junction mass/malignancy. Will try schedule for EGD tomorrow. NPO after midnight  K. Denzil Magnuson , MD [phone] Mon-Fri 8a-5p [phone] after 5p, weekends, holidays

## 2015-12-25 NOTE — ED Notes (Signed)
Attempted to call report x2

## 2015-12-25 NOTE — Progress Notes (Signed)
Utilization Review Completed.  

## 2015-12-25 NOTE — Progress Notes (Signed)
[facility]                                                                              Patient Demographics  Sean Figueroa, is a 82 y.o. male, DOB - 12/07/1932, [id number]  Admit date - 12/24/2015   Admitting Physician Ripudeep Krystal Eaton, MD  Outpatient Primary MD for the patient is [name], MD  LOS - 1   Chief Complaint  Patient presents with  . Melena  . Emesis  . Neck Pain       Brief HPI   Patient is a 82 year old male with CAD, on dual antiplatelet therapy with aspirin and Plavix, hypertension, hyperlipidemia, GERD, COPD, tobacco abuse Presented to ED with nausea, vomiting, melanotic stools. History was obtained from the patient who reported that he's been having nausea and vomiting for the last 6 days and has been having difficulty keeping anything down. He has no abdominal pain, no hematemesis. He kept trying to keep himself hydrated by drinking a lot of fluids. Since yesterday patient noticed having dark melanotic stools. He takes aspirin and Plavix. He noticed dizziness since yesterday but no chest pain or shortness of breath. He denied any recent endoscopy procedures. In ED patient was noted to have sodium of 133, potassium 3.4, creatinine 1.2, WBCs 12.7, hemoglobin 13.6, FOBT positive. MCV 101.8  No chest x-ray available.    Assessment & Plan   Principal Problem:  Nausea, vomiting with GI bleed: Has underlying history of GERD, on dual antiplatelet therapy, denies any recent NSAID use - Per patient, no nausea, vomiting, bleeding overnight - Continue to hold aspirin and Plavix - Continue  IV Protonix drip, NPO status, serial H&H - GI to follow today   Active Problems:  Tobacco abuse - Patient counseled strongly on tobacco cessation, placed on nicotine patch   Hypokalemia - Replaced IV  (HCC)/ischemic heart disease, MI, DES in 2013 to circumflex and left main - Hold aspirin, Plavix, - Due to borderline BP, hold beta blocker,ARB, Imdur,  Lasix for now   COPD, severe ([place]) - Patient still continues to smoke, recommended strongly to quit smoking - Placed on O2, DuoNeb nebs as needed   Chronic systolic heart failure ([place]) - 2-D echo 11/15 had shown EF of [id number], grade 1 diastolic dysfunction -Currently BP borderline low, hold aspirin, Plavix, metoprolol, Avapro, Imdur, Lasix   GERD (gastroesophageal reflux disease) - Continue PPI   Leukocytosis - Currently does not have any fevers, monitor closely for any aspiration due to nausea, vomiting  - Improving  Deconditioning - PTOT evaluation prior to discharge  DVT prophylaxis: SCDs   Code Status: Full CODE STATUS  Family Communication: Discussed in detail with the patient, all imaging results, lab results explained to the patient   Disposition Plan: Awaiting GI evaluation  Time Spent in minutes   25 minutes  Procedures  NONE   Consults   GI   DVT Prophylaxis  SCD's  Medications  Scheduled Meds: . ipratropium  0.5 mg Nebulization Q6H  . nicotine  21 mg Transdermal Daily  . pantoprazole (PROTONIX) IV  40 mg Intravenous Q12H  . pramipexole  0.125 mg Oral QHS  Continuous Infusions: . sodium chloride    . pantoprozole (PROTONIX) infusion 8 mg/hr (12/24/15 1854)   PRN Meds:.acetaminophen **OR** acetaminophen, ALPRAZolam, guaiFENesin-dextromethorphan, HYDROmorphone (DILAUDID) injection, ipratropium-albuterol, meclizine, ondansetron **OR** ondansetron (ZOFRAN) IV   Antibiotics   Anti-infectives    None        Subjective:   Sean Figueroa was seen and examined today.  Patient denies dizziness, chest pain, shortness of breath, abdominal pain, N/V/D/C, new weakness, numbess, tingling. No acute events overnight.    Objective:   Blood pressure 123/76, pulse 93, temperature 98.3 F (36.8 C), temperature source Oral, resp. rate 31, height 5\' 8"  (1.727 m), weight 68.675 kg (151 lb 6.4 oz), SpO2 94 %.  Wt Readings from Last 3 Encounters:  12/25/15  68.675 kg (151 lb 6.4 oz)  12/13/15 72.576 kg (160 lb)  10/09/15 72.576 kg (160 lb)     Intake/Output Summary (Last 24 hours) at 12/25/15 1028 Last data filed at 12/25/15 0902  Gross per 24 hour  Intake   1000 ml  Output    175 ml  Net    825 ml    Exam  General: Alert and oriented x 3, NAD, hearing deficit  HEENT:  PERRLA, EOMI, Anicteric Sclera, mucous membranes moist.   Neck: Supple, no JVD, no masses  CVS: S1 S2 auscultated, no rubs, murmurs or gallops. Regular rate and rhythm.  Respiratory: Clear to auscultation bilaterally, no wheezing, rales or rhonchi  Abdomen: Soft, nontender, nondistended, + bowel sounds  Ext: no cyanosis clubbing or edema  Neuro: no new neuro deficits  Skin: No rashes  Psych: Normal affect and demeanor, alert and oriented x3    Data Review   Micro Results No results found for this or any previous visit (from the past 240 hour(s)).  Radiology Reports No results found.  CBC  Recent Labs Lab 12/24/15 1540 12/25/15 0425  WBC 12.7* 11.3*  HGB 13.6 12.5*  HCT 40.0 37.2*  PLT 205 179  MCV 101.8* 102.5*  MCH 34.6* 34.4*  MCHC 34.0 33.6  RDW 13.7 13.8  LYMPHSABS  --  1.9  MONOABS  --  0.9  EOSABS  --  0.2  BASOSABS  --  0.0    Chemistries   Recent Labs Lab 12/24/15 1540 12/25/15 0624  NA 133* 137  K 3.4* 3.2*  CL 91* 100*  CO2 29 28  GLUCOSE 148* 110*  BUN 21* 14  CREATININE 1.20 0.93  CALCIUM 8.7* 8.2*  AST 16  --   ALT 12*  --   ALKPHOS 68  --   BILITOT 1.0  --    ------------------------------------------------------------------------------------------------------------------ estimated creatinine clearance is 61.3 mL/min (by C-G formula based on Cr of 0.93). ------------------------------------------------------------------------------------------------------------------ No results for input(s): HGBA1C in the last 72  hours. ------------------------------------------------------------------------------------------------------------------ No results for input(s): CHOL, HDL, LDLCALC, TRIG, CHOLHDL, LDLDIRECT in the last 72 hours. ------------------------------------------------------------------------------------------------------------------ No results for input(s): TSH, T4TOTAL, T3FREE, THYROIDAB in the last 72 hours.  Invalid input(s): FREET3 ------------------------------------------------------------------------------------------------------------------ No results for input(s): VITAMINB12, FOLATE, FERRITIN, TIBC, IRON, RETICCTPCT in the last 72 hours.  Coagulation profile  Recent Labs Lab 12/24/15 1730  INR 1.06    No results for input(s): DDIMER in the last 72 hours.  Cardiac Enzymes No results for input(s): CKMB, TROPONINI, MYOGLOBIN in the last 168 hours.  Invalid input(s): CK ------------------------------------------------------------------------------------------------------------------ Invalid input(s): POCBNP  No results for input(s): GLUCAP in the last 72 hours.   RAI,RIPUDEEP M.D. [facility] 12/25/2015, 10:28 AM  Pager: [phone] Between 7am to 7pm -  call Pager - [phone]  After 7pm go to [email or web address] - password [email or web address]  Call night coverage person covering after 7pm

## 2015-12-26 ENCOUNTER — Inpatient Hospital Stay (HOSPITAL_COMMUNITY): Payer: Medicare Other

## 2015-12-26 DIAGNOSIS — R9389 Abnormal findings on diagnostic imaging of other specified body structures: Secondary | ICD-10-CM | POA: Insufficient documentation

## 2015-12-26 DIAGNOSIS — R1310 Dysphagia, unspecified: Secondary | ICD-10-CM | POA: Insufficient documentation

## 2015-12-26 LAB — HEMOGLOBIN AND HEMATOCRIT, BLOOD
HEMATOCRIT: 36.4 % — AB (ref 39.0–52.0)
HEMATOCRIT: 36.4 % — AB (ref 39.0–52.0)
HEMOGLOBIN: 11.9 g/dL — AB (ref 13.0–17.0)
HEMOGLOBIN: 12.1 g/dL — AB (ref 13.0–17.0)

## 2015-12-26 LAB — BASIC METABOLIC PANEL
Anion gap: 11 (ref 5–15)
BUN: 7 mg/dL (ref 6–20)
CHLORIDE: 100 mmol/L — AB (ref 101–111)
CO2: 25 mmol/L (ref 22–32)
Calcium: 8.6 mg/dL — ABNORMAL LOW (ref 8.9–10.3)
Creatinine, Ser: 0.84 mg/dL (ref 0.61–1.24)
GFR calc non Af Amer: >60 mL/min (ref >60)
Glucose, Bld: 113 mg/dL — ABNORMAL HIGH (ref 65–99)
POTASSIUM: 3.7 mmol/L (ref 3.5–5.1)
SODIUM: 136 mmol/L (ref 135–145)

## 2015-12-26 MED ORDER — ENSURE ENLIVE PO LIQD
237.0000 mL | Freq: Two times a day (BID) | ORAL | Status: DC
  Administered 2015-12-26 – 2016-01-01 (×11): 237 mL via ORAL

## 2015-12-26 MED ORDER — FUROSEMIDE 20 MG PO TABS
20.0000 mg | ORAL_TABLET | Freq: Every day | ORAL | Status: DC
  Administered 2015-12-26 – 2016-01-01 (×6): 20 mg via ORAL
  Filled 2015-12-26 (×6): qty 1

## 2015-12-26 MED ORDER — CARVEDILOL 3.125 MG PO TABS
3.1250 mg | ORAL_TABLET | Freq: Two times a day (BID) | ORAL | Status: DC
  Administered 2015-12-26 – 2015-12-31 (×8): 3.125 mg via ORAL
  Filled 2015-12-26 (×10): qty 1

## 2015-12-26 MED ORDER — PIPERACILLIN-TAZOBACTAM 3.375 G IVPB
3.3750 g | Freq: Three times a day (TID) | INTRAVENOUS | Status: DC
  Administered 2015-12-26 – 2015-12-31 (×16): 3.375 g via INTRAVENOUS
  Filled 2015-12-26 (×21): qty 50

## 2015-12-26 MED ORDER — IPRATROPIUM-ALBUTEROL 0.5-2.5 (3) MG/3ML IN SOLN
3.0000 mL | RESPIRATORY_TRACT | Status: DC
  Administered 2015-12-26 – 2015-12-28 (×13): 3 mL via RESPIRATORY_TRACT
  Filled 2015-12-26 (×13): qty 3

## 2015-12-26 NOTE — Progress Notes (Signed)
Pharmacy Antibiotic Note  Sean Figueroa is a 82 y.o. male admitted on 12/24/2015 with melena, emesis and neck pain.  Pharmacy has been consulted for zosyn dosing for aspiration PNA.   Creat 0.84.   Plan: Zosyn 3.375g IV q8h (4 hour infusion).  Pharmacy will sign off.   Height: 5\' 8"  (172.7 cm) Weight: 151 lb 6.4 oz (68.675 kg) IBW/kg (Calculated) : 68.4  Temp (24hrs), Avg:98.4 F (36.9 C), Min:97.4 F (36.3 C), Max:99.2 F (37.3 C)   Recent Labs Lab 12/24/15 1540 12/25/15 0425 12/25/15 0624 12/26/15 0348  WBC 12.7* 11.3*  --   --   CREATININE 1.20  --  0.93 0.84    Estimated Creatinine Clearance: 67.9 mL/min (by C-G formula based on Cr of 0.84).    Allergies  Allergen Reactions  . Indomethacin Other (See Comments)    Unknown reaction  . Lipitor [Atorvastatin Calcium] Other (See Comments)    Leg pain  . Pravachol Other (See Comments)    Leg pain  . Procardia [Nifedipine] Other (See Comments)    Reaction unknown  . Zaroxolyn [Metolazone] Other (See Comments)    Reaction unknown    Thank you for allowing pharmacy to be a part of this patient's care.  Eudelia Bunch, Pharm.D. [id number] 12/26/2015 10:26 AM

## 2015-12-26 NOTE — Progress Notes (Signed)
Daily Rounding Note  12/26/2015, 8:10 AM  LOS: 2 days   SUBJECTIVE:       No pain.  Coughing up yellow sputum.  No vomiting.  No stool  OBJECTIVE:         Vital signs in last 24 hours:    Temp:  [97.4 F (36.3 C)-99.2 F (37.3 C)] 97.4 F (36.3 C) (02/07 0401) Pulse Rate:  [82-112] 91 (02/07 0600) Resp:  [20-39] 29 (02/07 0600) BP: (107-137)/(56-81) 137/70 mmHg (02/07 0600) SpO2:  [93 %-100 %] 95 % (02/07 0744) Weight:  [68.675 kg (151 lb 6.4 oz)] 68.675 kg (151 lb 6.4 oz) (02/06 0822) Last BM Date: 12/24/15 Filed Weights   12/25/15 0822  Weight: 68.675 kg (151 lb 6.4 oz)   General: pleasant, chronically ill looking.  Comfortable.  HOH.     Heart: irreg, irreg Chest: diminished BS but clear. Thin yellow sputum observed in cup at bedside.  Abdomen: soft, active BS.  NT, ND.   Extremities: no CCE Neuro/Psych:  HOH.  Pleasant, cooperative.   Intake/Output from previous day: 02/06 0701 - 02/07 0700 In: 2525 [P.O.:700; I.V.:1525; IV Piggyback:300] Out: 600 [Urine:600]  Intake/Output this shift:    Lab Results:  Recent Labs  12/24/15 1540 12/25/15 0425 12/25/15 1419 12/25/15 1954 12/26/15 0348  WBC 12.7* 11.3*  --   --   --   HGB 13.6 12.5* 12.9* 12.1* 11.9*  12.1*  HCT 40.0 37.2* 38.7* 37.3* 36.4*  36.4*  PLT 205 179  --   --   --    BMET  Recent Labs  12/24/15 1540 12/25/15 0624 12/26/15 0348  NA 133* 137 136  K 3.4* 3.2* 3.7  CL 91* 100* 100*  CO2 29 28 25   GLUCOSE 148* 110* 113*  BUN 21* 14 7  CREATININE 1.20 0.93 0.84  CALCIUM 8.7* 8.2* 8.6*   LFT  Recent Labs  12/24/15 1540  PROT 6.0*  ALBUMIN 3.1*  AST 16  ALT 12*  ALKPHOS 68  BILITOT 1.0   PT/INR  Recent Labs  12/24/15 1730  LABPROT 14.0  INR 1.06   Hepatitis Panel No results for input(s): HEPBSAG, HCVAB, HEPAIGM, HEPBIGM in the last 72 hours.  Studies/Results: Dg Esophagus  12/25/2015  CLINICAL DATA:   Dysphagia.  Nausea and vomiting. EXAM: ESOPHOGRAM/BARIUM SWALLOW TECHNIQUE: Single contrast examination was performed using  thin barium. FLUOROSCOPY TIME:  Radiation Exposure Index (as provided by the fluoroscopic device): If the device does not provide the exposure index: Fluoroscopy Time:  1 minutes 54 seconds Number of Acquired Images: COMPARISON:  None. FINDINGS: The patient was very short of breath and had difficulty swallowing barium. There is an irregular narrowing of the distal esophagus. The esophagus is dilated above the narrowing. Barium did pass through the area of narrowing which shows mucosal irregularity and is worrisome for carcinoma of the distal esophagus. This appears be partially obstructing. Limited evaluation of the remainder of the esophagus. Endoscopy recommended. IMPRESSION: Irregular mass distal esophagus consistent with carcinoma of the esophagus. This is partially obstructing. Endoscopy and biopsy recommended. Electronically Signed   By: Franchot Gallo M.D.   On: 12/25/2015 14:54   Scheduled Meds: . aspirin EC  81 mg Oral Daily  . ipratropium-albuterol  3 mL Nebulization Q4H  . nicotine  21 mg Transdermal Daily  . pantoprazole  40 mg Oral BID AC  . pramipexole  0.125 mg Oral QHS   Continuous Infusions: . sodium chloride  75 mL/hr at 12/26/15 0010   PRN Meds:.acetaminophen **OR** acetaminophen, ALPRAZolam, guaiFENesin-dextromethorphan, HYDROmorphone (DILAUDID) injection, meclizine, ondansetron **OR** ondansetron (ZOFRAN) IV   ASSESMENT:   *  N/V, melenic stool, dysphagia.  FOBT +.No PPI and only recent initiation Ranitidine PTA.  Now on BID PPI.  Distal esophageal mass on 2/6 esophagram.  *  Chronic Plavix (last dose 2/5), ASA for hx a fib, DES 2013.  Plavix on hold, 83ASA continues.   *  Macrocytosis (MCV 101), Hgb relatively stable.  No ETOH at home.  No hx of B12 or folate supplementation.   *  EF 35%  *  Severe COPD, on hone oxygen   *  Hypokalemia,  corrected.   PLAN   *  EGD, biopsy tomorrow with MAC.  Last took Plavix 2/5 *  EGD tomorrow, full liquids today.  *  BID PO Protonix.  *  CBC in AM.     Sean Figueroa  12/26/2015, 8:10 AM Pager: [phone]   Attending physician's note   I have taken an interval history, reviewed the chart and examined the patient. I agree with the Advanced Practitioner's note, impression and recommendations.  Barium esophagram concerning for EG junction mass lesion. Plan for EGD tomorrow  with MAC  NPO after midnight  K Denzil Magnuson, MD [phone] Mon-Fri 8a-5p [phone] after 5p, weekends, holidays

## 2015-12-26 NOTE — Progress Notes (Signed)
[facility]                                                                              Patient Demographics  Sean Figueroa, is a 82 y.o. male, DOB - Mar 02, 1933, [id number]  Admit date - 12/24/2015   Admitting Physician Jonothan Heberle Krystal Eaton, MD  Outpatient Primary MD for the patient is [name], MD  LOS - 2   Chief Complaint  Patient presents with  . Melena  . Emesis  . Neck Pain       Brief HPI   Patient is a 82 year old male with CAD, on dual antiplatelet therapy with aspirin and Plavix, hypertension, hyperlipidemia, GERD, COPD, tobacco abuse Presented to ED with nausea, vomiting, melanotic stools. History was obtained from the patient who reported that he's been having nausea and vomiting for the last 6 days and has been having difficulty keeping anything down. He has no abdominal pain, no hematemesis. He kept trying to keep himself hydrated by drinking a lot of fluids. Since yesterday patient noticed having dark melanotic stools. He takes aspirin and Plavix. He noticed dizziness since yesterday but no chest pain or shortness of breath. He denied any recent endoscopy procedures. In ED patient was noted to have sodium of 133, potassium 3.4, creatinine 1.2, WBCs 12.7, hemoglobin 13.6, FOBT positive. MCV 101.8  No chest x-ray available.    Assessment & Plan   Principal Problem:  Nausea, vomiting with GI bleed, dysphagia: Has underlying history of GERD, on dual antiplatelet therapy, denies any recent NSAID use - Esophagogram 2/6 showed irregular mass in the distal esophagus consistent with carcinoma of the esophagus, partially obstructing  - Continue to hold aspirin and Plavix - Continue Protonix drip  - GI following, planning endoscopy   Active Problems: Aspiration pneumonia - Complaining of coughing with yellowish phlegm, chest x-ray shows left lung small to moderate pleural effusion with consolidation - Placed on DuoNeb's, IV Zosyn, repeat chest x-ray in  24-48 hours, if no significant improvement or worsening of pleural effusion, may attempt thoracentesis   Tobacco abuse - Patient counseled strongly on tobacco cessation, placed on nicotine patch   Hypokalemia - Replaced  (HCC)/ischemic heart disease, MI, DES in 2013 to circumflex and left main - Hold aspirin, Plavix, - Due to borderline BP, hold beta blocker,ARB, Imdur, Lasix for now   COPD, severe ([place]) - Patient still continues to smoke, recommended strongly to quit smoking - Placed on O2, DuoNeb   Chronic systolic heart failure ([place]) - 2-D echo 11/15 had shown EF of [id number], grade 1 diastolic dysfunction - Continue to hold aspirin and Plavix - Restart Lasix, Coreg - Hold Avapro, Imdur   GERD (gastroesophageal reflux disease) - Continue PPI  Deconditioning - PTOT evaluation prior to discharge  DVT prophylaxis: SCDs   Code Status: Full CODE STATUS  Family Communication: Discussed in detail with the patient, all imaging results, lab results explained to the patient   Disposition Plan:  Cont to monitor in SDU today  Time Spent in minutes   25 minutes  Procedures  CXR esophagogram   Consults   GI   DVT Prophylaxis  SCD's  Medications  Scheduled Meds: . aspirin EC  81 mg Oral Daily  . ipratropium-albuterol  3 mL Nebulization Q4H  . nicotine  21 mg Transdermal Daily  . pantoprazole  40 mg Oral BID AC  . pramipexole  0.125 mg Oral QHS   Continuous Infusions:   PRN Meds:.acetaminophen **OR** acetaminophen, ALPRAZolam, guaiFENesin-dextromethorphan, HYDROmorphone (DILAUDID) injection, meclizine, ondansetron **OR** ondansetron (ZOFRAN) IV   Antibiotics   Anti-infectives    None        Subjective:   Sean Figueroa was seen and examined today.  complaining of coughing with yellowish phlegm. No nausea, vomiting, or bleeding overnight. Patient denies dizziness, new weakness, numbess, tingling. No fevers  Objective:   Blood pressure 112/87, pulse 108,  temperature 97.5 F (36.4 C), temperature source Axillary, resp. rate 25, height 5\' 8"  (1.727 m), weight 68.675 kg (151 lb 6.4 oz), SpO2 100 %.  Wt Readings from Last 3 Encounters:  12/25/15 68.675 kg (151 lb 6.4 oz)  12/13/15 72.576 kg (160 lb)  10/09/15 72.576 kg (160 lb)     Intake/Output Summary (Last 24 hours) at 12/26/15 1019 Last data filed at 12/26/15 0900  Gross per 24 hour  Intake   2675 ml  Output    675 ml  Net   2000 ml    Exam  General: Alert and oriented x 3, NAD, hearing deficit  HEENT:  PERRLA, EOMI  Neck: Supple, no JVD, no masses  CVS: S1 S2 clear, RRR  Respiratory: Decreased breath sounds at the bases with left-sided rhonchi   Abdomen: Soft, nontender, nondistended, + bowel sounds  Ext: no cyanosis clubbing or edema  Neuro: no new neuro deficits  Skin: No rashes  Psych: Normal affect and demeanor, alert and oriented x3    Data Review   Micro Results Recent Results (from the past 240 hour(s))  MRSA PCR Screening     Status: None   Collection Time: 12/25/15  8:53 AM  Result Value Ref Range Status   MRSA by PCR NEGATIVE NEGATIVE Final    Comment:        The GeneXpert MRSA Assay (FDA approved for NASAL specimens only), is one component of a comprehensive MRSA colonization surveillance program. It is not intended to diagnose MRSA infection nor to guide or monitor treatment for MRSA infections.     Radiology Reports Dg Esophagus  12/25/2015  CLINICAL DATA:  Dysphagia.  Nausea and vomiting. EXAM: ESOPHOGRAM/BARIUM SWALLOW TECHNIQUE: Single contrast examination was performed using  thin barium. FLUOROSCOPY TIME:  Radiation Exposure Index (as provided by the fluoroscopic device): If the device does not provide the exposure index: Fluoroscopy Time:  1 minutes 54 seconds Number of Acquired Images: COMPARISON:  None. FINDINGS: The patient was very short of breath and had difficulty swallowing barium. There is an irregular narrowing of the distal  esophagus. The esophagus is dilated above the narrowing. Barium did pass through the area of narrowing which shows mucosal irregularity and is worrisome for carcinoma of the distal esophagus. This appears be partially obstructing. Limited evaluation of the remainder of the esophagus. Endoscopy recommended. IMPRESSION: Irregular mass distal esophagus consistent with carcinoma of the esophagus. This is partially obstructing. Endoscopy and biopsy recommended. Electronically Signed   By: Franchot Gallo M.D.   On: 12/25/2015 14:54   Dg Chest Port 1 View  12/26/2015  CLINICAL DATA:  82 year old male with shortness of Breath, dysphagia, evidence of esophageal tumor on recent esophagram. Initial encounter. EXAM: PORTABLE CHEST 1 VIEW COMPARISON:  Chest radiographs 09/21/2015.  Esophagram  12/25/2015. FINDINGS: Portable AP semi upright view at 0842 hours. Small a moderate left pleural effusion. Trace right pleural effusion. Mediastinal contours are stable since November. Visualized tracheal air column is within normal limits. No pneumothorax or pulmonary edema. Aside from the dense opacification of the left lung base, no confluent pulmonary opacity. IMPRESSION: Small to moderate left pleural effusion with left lung base collapse or consolidation. Electronically Signed   By: Genevie Ann M.D.   On: 12/26/2015 08:52    CBC  Recent Labs Lab 12/24/15 1540 12/25/15 0425 12/25/15 1419 12/25/15 1954 12/26/15 0348  WBC 12.7* 11.3*  --   --   --   HGB 13.6 12.5* 12.9* 12.1* 11.9*  12.1*  HCT 40.0 37.2* 38.7* 37.3* 36.4*  36.4*  PLT 205 179  --   --   --   MCV 101.8* 102.5*  --   --   --   MCH 34.6* 34.4*  --   --   --   MCHC 34.0 33.6  --   --   --   RDW 13.7 13.8  --   --   --   LYMPHSABS  --  1.9  --   --   --   MONOABS  --  0.9  --   --   --   EOSABS  --  0.2  --   --   --   BASOSABS  --  0.0  --   --   --     Chemistries   Recent Labs Lab 12/24/15 1540 12/25/15 0624 12/26/15 0348  NA 133* 137 136   K 3.4* 3.2* 3.7  CL 91* 100* 100*  CO2 29 28 25   GLUCOSE 148* 110* 113*  BUN 21* 14 7  CREATININE 1.20 0.93 0.84  CALCIUM 8.7* 8.2* 8.6*  AST 16  --   --   ALT 12*  --   --   ALKPHOS 68  --   --   BILITOT 1.0  --   --    ------------------------------------------------------------------------------------------------------------------ estimated creatinine clearance is 67.9 mL/min (by C-G formula based on Cr of 0.84). ------------------------------------------------------------------------------------------------------------------ No results for input(s): HGBA1C in the last 72 hours. ------------------------------------------------------------------------------------------------------------------ No results for input(s): CHOL, HDL, LDLCALC, TRIG, CHOLHDL, LDLDIRECT in the last 72 hours. ------------------------------------------------------------------------------------------------------------------ No results for input(s): TSH, T4TOTAL, T3FREE, THYROIDAB in the last 72 hours.  Invalid input(s): FREET3 ------------------------------------------------------------------------------------------------------------------ No results for input(s): VITAMINB12, FOLATE, FERRITIN, TIBC, IRON, RETICCTPCT in the last 72 hours.  Coagulation profile  Recent Labs Lab 12/24/15 1730  INR 1.06    No results for input(s): DDIMER in the last 72 hours.  Cardiac Enzymes No results for input(s): CKMB, TROPONINI, MYOGLOBIN in the last 168 hours.  Invalid input(s): CK ------------------------------------------------------------------------------------------------------------------ Invalid input(s): POCBNP  No results for input(s): GLUCAP in the last 72 hours.   Sean Figueroa M.D. [facility] 12/26/2015, 10:19 AM  Pager: [phone] Between 7am to 7pm - call Pager - [phone]  After 7pm go to [email or web address] - password [email or web address]  Call night coverage person covering after 7pm

## 2015-12-26 NOTE — Progress Notes (Signed)
Initial Nutrition Assessment  DOCUMENTATION CODES:   Not applicable  INTERVENTION:   Ensure Enlive po BID, each supplement provides 350 kcal and 20 grams of protein  NUTRITION DIAGNOSIS:   Increased nutrient needs related to chronic illness as evidenced by estimated needs  GOAL:   Patient will meet greater than or equal to 90% of their needs   MONITOR:   PO intake, Supplement acceptance, Labs, Weight trends, I & O's  REASON FOR ASSESSMENT:   Malnutrition Screening Tool   ASSESSMENT:   82 yo Male with CAD, on dual antiplatelet therapy with aspirin and Plavix, hypertension, hyperlipidemia, GERD, COPD, tobacco abuse Presented to ED with nausea, vomiting, melanotic stools. History was obtained from the patient who reported that he's been having nausea and vomiting for the last 6 days and has been having difficulty keeping anything down. He has no abdominal pain, no hematemesis. He kept trying to keep himself hydrated by drinking a lot of fluids. Since yesterday patient noticed having dark melanotic stools. He takes aspirin and Plavix. He noticed dizziness since yesterday but no chest pain or shortness of breath. He denied any recent endoscopy procedures.  RD unable to obtain nutrition hx or complete Nutrition Focused Physical Exam.  Pt confused upon RD visit and not answering interview questions.  PO intake variable at 0-100% per flowsheet records.  GI note reviewed.  Pt for EGD, biopsy tomorrow.  Barium esophagram was concerning for EG junction mass lesion.  Would benefit from oral nutrition supplements.  RD to order.  Diet Order:  Diet NPO time specified Diet full liquid Room service appropriate?: Yes; Fluid consistency:: Thin  Skin:  Reviewed, no issues  Last BM:  2/6  Height:   Ht Readings from Last 1 Encounters:  12/25/15 5\' 8"  (1.727 m)    Weight:   Wt Readings from Last 1 Encounters:  12/25/15 151 lb 6.4 oz (68.675 kg)    Ideal Body Weight:  70 kg  BMI:  Body  mass index is 23.03 kg/(m^2).  Estimated Nutritional Needs:   Kcal:  1600-1800  Protein:  80-90 gm  Fluid:  1.6-1.8 L  EDUCATION NEEDS:   No education needs identified at this time  Arthur Holms, RD, LDN Pager #: [phone] After-Hours Pager #: [phone]

## 2015-12-27 ENCOUNTER — Inpatient Hospital Stay (HOSPITAL_COMMUNITY): Payer: Medicare Other | Admitting: Certified Registered Nurse Anesthetist

## 2015-12-27 ENCOUNTER — Encounter (HOSPITAL_COMMUNITY)
Admission: EM | Disposition: A | Discharge status: Hospice | Payer: Self-pay | Source: Home / Self Care | Attending: Internal Medicine

## 2015-12-27 ENCOUNTER — Encounter (HOSPITAL_COMMUNITY): Payer: Self-pay | Admitting: Gastroenterology

## 2015-12-27 ENCOUNTER — Inpatient Hospital Stay (HOSPITAL_COMMUNITY): Payer: Medicare Other

## 2015-12-27 DIAGNOSIS — E46 Unspecified protein-calorie malnutrition: Secondary | ICD-10-CM

## 2015-12-27 DIAGNOSIS — R938 Abnormal findings on diagnostic imaging of other specified body structures: Secondary | ICD-10-CM

## 2015-12-27 DIAGNOSIS — K208 Other esophagitis: Secondary | ICD-10-CM

## 2015-12-27 DIAGNOSIS — D500 Iron deficiency anemia secondary to blood loss (chronic): Secondary | ICD-10-CM

## 2015-12-27 DIAGNOSIS — K228 Other specified diseases of esophagus: Secondary | ICD-10-CM | POA: Insufficient documentation

## 2015-12-27 DIAGNOSIS — K229 Disease of esophagus, unspecified: Secondary | ICD-10-CM

## 2015-12-27 DIAGNOSIS — R634 Abnormal weight loss: Secondary | ICD-10-CM

## 2015-12-27 DIAGNOSIS — K2289 Other specified disease of esophagus: Secondary | ICD-10-CM | POA: Insufficient documentation

## 2015-12-27 DIAGNOSIS — R1310 Dysphagia, unspecified: Secondary | ICD-10-CM

## 2015-12-27 DIAGNOSIS — I2510 Atherosclerotic heart disease of native coronary artery without angina pectoris: Secondary | ICD-10-CM

## 2015-12-27 DIAGNOSIS — L03116 Cellulitis of left lower limb: Secondary | ICD-10-CM

## 2015-12-27 DIAGNOSIS — J449 Chronic obstructive pulmonary disease, unspecified: Secondary | ICD-10-CM

## 2015-12-27 DIAGNOSIS — Z720 Tobacco use: Secondary | ICD-10-CM

## 2015-12-27 DIAGNOSIS — L03115 Cellulitis of right lower limb: Secondary | ICD-10-CM

## 2015-12-27 HISTORY — PX: ESOPHAGOGASTRODUODENOSCOPY (EGD) WITH PROPOFOL: SHX5813

## 2015-12-27 LAB — CBC
HEMATOCRIT: 34.5 % — AB (ref 39.0–52.0)
Hemoglobin: 11.4 g/dL — ABNORMAL LOW (ref 13.0–17.0)
MCH: 32.9 pg (ref 26.0–34.0)
MCHC: 33 g/dL (ref 30.0–36.0)
MCV: 99.7 fL (ref 78.0–100.0)
PLATELETS: 155 10*3/uL (ref 150–400)
RBC: 3.46 MIL/uL — ABNORMAL LOW (ref 4.22–5.81)
RDW: 13.5 % (ref 11.5–15.5)
WBC: 8.1 10*3/uL (ref 4.0–10.5)

## 2015-12-27 LAB — BASIC METABOLIC PANEL
Anion gap: 11 (ref 5–15)
BUN: 8 mg/dL (ref 6–20)
CALCIUM: 8.5 mg/dL — AB (ref 8.9–10.3)
CO2: 29 mmol/L (ref 22–32)
CREATININE: 0.85 mg/dL (ref 0.61–1.24)
Chloride: 100 mmol/L — ABNORMAL LOW (ref 101–111)
GFR calc Af Amer: >60 mL/min (ref >60)
GLUCOSE: 109 mg/dL — AB (ref 65–99)
POTASSIUM: 3.6 mmol/L (ref 3.5–5.1)
SODIUM: 140 mmol/L (ref 135–145)

## 2015-12-27 SURGERY — ESOPHAGOGASTRODUODENOSCOPY (EGD) WITH PROPOFOL
Anesthesia: Monitor Anesthesia Care

## 2015-12-27 MED ORDER — LACTATED RINGERS IV SOLN
INTRAVENOUS | Status: DC | PRN
  Administered 2015-12-27: 13:00:00 via INTRAVENOUS

## 2015-12-27 MED ORDER — PROPOFOL 10 MG/ML IV BOLUS
INTRAVENOUS | Status: DC | PRN
  Administered 2015-12-27: 20 mg via INTRAVENOUS

## 2015-12-27 MED ORDER — PROPOFOL 500 MG/50ML IV EMUL
INTRAVENOUS | Status: DC | PRN
  Administered 2015-12-27: 100 ug/kg/min via INTRAVENOUS

## 2015-12-27 MED ORDER — IOHEXOL 300 MG/ML  SOLN
100.0000 mL | Freq: Once | INTRAMUSCULAR | Status: AC | PRN
  Administered 2015-12-27: 100 mL via INTRAVENOUS

## 2015-12-27 MED ORDER — BUTAMBEN-TETRACAINE-BENZOCAINE 2-2-14 % EX AERO
INHALATION_SPRAY | CUTANEOUS | Status: DC | PRN
  Administered 2015-12-27: 2 via TOPICAL

## 2015-12-27 MED ORDER — IOHEXOL 300 MG/ML  SOLN
25.0000 mL | INTRAMUSCULAR | Status: AC
  Administered 2015-12-27 (×2): 25 mL via ORAL

## 2015-12-27 NOTE — Progress Notes (Signed)
[facility] CONSULT NOTE  Patient Care Team: [name], MD as PCP - General  CHIEF COMPLAINTS/PURPOSE OF CONSULTATION:  Esophageal mass, biopsy pending, worrisome for esophageal cancer  HISTORY OF PRESENTING ILLNESS:  Sean Figueroa 82 y.o. male was admitted to the hospital since 12/24/2015 after presentation with nausea, vomiting and passage of dark stool. According to the patient, he has been having some dysphagia and odynophagia for the 2 weeks. He also complained of intermittent passage of dark stool. He denies recent aspiration episode although he said some food is difficult to swallow and he had recent regurgitation of food He felt that he is able to drink liquids without problem. He said his baseline weight is around 170 pounds and he had progressive weight loss over the past few months to his current weight of 155 pounds. He denies anorexia. He complained of mild abdominal distention recently. He denies changes in bowel habits from the melanotic stool The patient had multiple other comorbidities including coronary artery disease, history of reflux disease, COPD and tobacco abuse He is on multiple different antiplatelet agents. He denies recent chest pain, dizziness or shortness of breath. He complains of bilateral lower extremity cellulitis recently.  MEDICAL HISTORY:  Past Medical History  Diagnosis Date  . 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 Has chronic angina; S/P DES to the LCX and DES to the L MAIN 12/2011. Now on Plavix and ASA.   Marland Kitchen Hyperlipidemia   . Hypertension   . Long-term (current) use of anticoagulants     For hx of LV apical aneurysm  . LVH (left ventricular hypertrophy)   . Atrial fibrillation ([place])     Unclear history  . Tobacco abuse   . Systolic heart failure     EF is 35% per cath 12/2011  . Myocardial infarction [facility]) 1981     [name] 01/14/2012  . Left ventricular apical thrombus ([place])     hx/notes 10/06/2014  . On home oxygen therapy     "2.5L 24/7" (10/06/2014)  . COPD (chronic obstructive pulmonary disease) ([place])   . Pneumonia "several times"  . Arthritis     "shoulders; back" (10/06/2014)  . History of gout   . Chronic back pain     "anytime it's wet and cold" (10/06/2014)  . Nocturia   . Syncopal episodes     SURGICAL HISTORY: Past Surgical History  Procedure Laterality Date  . Cholecystectomy    . Hernia repair    . Coronary angioplasty with stent placement  12/2011    LCX and L main - drug eluting  . Cardiac catheterization  1981    [name] 01/14/2012  . Left heart catheterization with coronary angiogram N/A 01/16/2012    Procedure: LEFT HEART CATHETERIZATION WITH CORONARY ANGIOGRAM;  Surgeon: Sherren Mocha, MD;  Location: [facility] CATH LAB;  Service: Cardiovascular;  Laterality: N/A;  . Percutaneous coronary stent intervention (pci-s) N/A 01/20/2012    Procedure: PERCUTANEOUS CORONARY STENT INTERVENTION (PCI-S);  Surgeon: Sherren Mocha, MD;  Location: [facility] CATH LAB;  Service: Cardiovascular;  Laterality: N/A;    SOCIAL HISTORY: Social History   Social History  . Marital Status: Widowed    Spouse Name: N/A  . Number of Children: N/A  . Years of Education: N/A   Occupational History  . Not on file.   Social History Main Topics  . Smoking status: Current Every  Day Smoker -- 0.75 packs/day for 65 years    Types: Cigarettes  . Smokeless tobacco: Never Used  . Alcohol Use: Yes     Comment: "quit drinking in the 1990's"  . Drug Use: No  . Sexual Activity: Not Currently   Other Topics Concern  . Not on file   Social History Narrative    FAMILY HISTORY: Family History  Problem Relation Age of Onset  . Heart disease Mother   . Cerebral palsy Daughter   . Hypertension Mother     ALLERGIES:  is allergic to indomethacin; lipitor; pravachol; procardia; and zaroxolyn.  MEDICATIONS:   Current Facility-Administered Medications  Medication Dose Route Frequency Provider Last Rate Last Dose  . acetaminophen (TYLENOL) tablet 650 mg  650 mg Oral Q6H PRN Ripudeep Krystal Eaton, MD       Or  . acetaminophen (TYLENOL) suppository 650 mg  650 mg Rectal Q6H PRN Ripudeep Krystal Eaton, MD      . ALPRAZolam Duanne Moron) tablet 0.25 mg  0.25 mg Oral BID PRN Ripudeep Krystal Eaton, MD   0.25 mg at 12/26/15 2115  . aspirin EC tablet 81 mg  81 mg Oral Daily Kavitha Nandigam V, MD   81 mg at 12/26/15 1013  . carvedilol (COREG) tablet 3.125 mg  3.125 mg Oral BID WC Ripudeep K Rai, MD   3.125 mg at 12/26/15 1700  . feeding supplement (ENSURE ENLIVE) (ENSURE ENLIVE) liquid 237 mL  237 mL Oral BID BM Ripudeep K Rai, MD   237 mL at 12/27/15 1345  . furosemide (LASIX) tablet 20 mg  20 mg Oral Daily Ripudeep Krystal Eaton, MD   20 mg at 12/26/15 1129  . guaiFENesin-dextromethorphan (ROBITUSSIN DM) 100-10 MG/5ML syrup 5 mL  5 mL Oral Q4H PRN Ripudeep Krystal Eaton, MD   5 mL at 12/26/15 1906  . HYDROmorphone (DILAUDID) injection 1 mg  1 mg Intravenous Q4H PRN Ripudeep K Rai, MD      . ipratropium-albuterol (DUONEB) 0.5-2.5 (3) MG/3ML nebulizer solution 3 mL  3 mL Nebulization Q4H Ripudeep K Rai, MD   3 mL at 12/27/15 1534  . meclizine (ANTIVERT) tablet 25 mg  25 mg Oral TID PRN Ripudeep Krystal Eaton, MD      . nicotine (NICODERM CQ - dosed in mg/24 hours) patch 21 mg  21 mg Transdermal Daily Ripudeep Krystal Eaton, MD   21 mg at 12/27/15 1132  . ondansetron (ZOFRAN) tablet 4 mg  4 mg Oral Q6H PRN Ripudeep K Rai, MD       Or  . ondansetron (ZOFRAN) injection 4 mg  4 mg Intravenous Q6H PRN Ripudeep K Rai, MD      . pantoprazole (PROTONIX) EC tablet 40 mg  40 mg Oral BID AC Kavitha Nandigam V, MD   40 mg at 12/26/15 0800  . piperacillin-tazobactam (ZOSYN) IVPB 3.375 g  3.375 g Intravenous Q8H Eudelia Bunch, RPH   3.375 g at 12/27/15 1131  . pramipexole (MIRAPEX) tablet 0.125 mg  0.125 mg Oral QHS Ripudeep K Rai, MD   0.125 mg at 12/26/15 2115    REVIEW OF  SYSTEMS:   Constitutional: Denies fevers, chills or abnormal night sweats Eyes: Denies blurriness of vision, double vision or watery eyes Ears, nose, mouth, throat, and face: Denies mucositis or sore throat Respiratory: Denies cough, dyspnea or wheezes Cardiovascular: Denies palpitation, chest discomfort or lower extremity swelling Lymphatics: Denies new lymphadenopathy or easy bruising Neurological:Denies numbness, tingling or new weaknesses Behavioral/Psych: Mood is stable, no  new changes  All other systems were reviewed with the patient and are negative.  PHYSICAL EXAMINATION: ECOG PERFORMANCE STATUS: 1 - Symptomatic but completely ambulatory  Filed Vitals:   12/27/15 1330 12/27/15 1340  BP: 147/68 152/78  Pulse: 50 88  Temp: 97.9 F (36.6 C)   Resp: 20 24   Filed Weights   12/25/15 0822 12/27/15 0400  Weight: 151 lb 6.4 oz (68.675 kg) 155 lb (70.308 kg)    GENERAL:alert, no distress and comfortable. He is hard of hearing SKIN: skin color, texture, turgor are normal, no rashes or significant lesions. Noted bilateral lower extremity scellulitis EYES: normal, conjunctiva are pink and non-injected, sclera clear OROPHARYNX:no exudate, no erythema and lips, buccal mucosa, and tongue normal  NECK: supple, thyroid normal size, non-tender, without nodularity LYMPH:  no palpable lymphadenopathy in the cervical, axillary or inguinal LUNGS: clear to auscultation and percussion with normal breathing effort HEART: regular rate & rhythm and no murmurs and mild lower extremity edema ABDOMEN:abdomen soft, distended with palpable fullness in the right upper quadrant, non-tender and normal bowel sounds Musculoskeletal:no cyanosis of digits and no clubbing  PSYCH: alert & oriented x 3 with fluent speech NEURO: no focal motor/sensory deficits  LABORATORY DATA:  I have reviewed the data as listed Lab Results  Component Value Date   WBC 8.1 12/27/2015   HGB 11.4* 12/27/2015   HCT 34.5*  12/27/2015   MCV 99.7 12/27/2015   PLT 155 12/27/2015    Recent Labs  12/24/15 1540 12/25/15 0624 12/26/15 0348 12/27/15 0300  NA 133* 137 136 140  K 3.4* 3.2* 3.7 3.6  CL 91* 100* 100* 100*  CO2 29 28 25 29   GLUCOSE 148* 110* 113* 109*  BUN 21* 14 7 8   CREATININE 1.20 0.93 0.84 0.85  CALCIUM 8.7* 8.2* 8.6* 8.5*  GFRNONAA 55* >60 >60 >60  GFRAA >60 >60 >60 >60  PROT 6.0*  --   --   --   ALBUMIN 3.1*  --   --   --   AST 16  --   --   --   ALT 12*  --   --   --   ALKPHOS 68  --   --   --   BILITOT 1.0  --   --   --     RADIOGRAPHIC STUDIES: I have personally reviewed the radiological images as listed and agreed with the findings in the report. Dg Esophagus  12/25/2015  CLINICAL DATA:  Dysphagia.  Nausea and vomiting. EXAM: ESOPHOGRAM/BARIUM SWALLOW TECHNIQUE: Single contrast examination was performed using  thin barium. FLUOROSCOPY TIME:  Radiation Exposure Index (as provided by the fluoroscopic device): If the device does not provide the exposure index: Fluoroscopy Time:  1 minutes 54 seconds Number of Acquired Images: COMPARISON:  None. FINDINGS: The patient was very short of breath and had difficulty swallowing barium. There is an irregular narrowing of the distal esophagus. The esophagus is dilated above the narrowing. Barium did pass through the area of narrowing which shows mucosal irregularity and is worrisome for carcinoma of the distal esophagus. This appears be partially obstructing. Limited evaluation of the remainder of the esophagus. Endoscopy recommended. IMPRESSION: Irregular mass distal esophagus consistent with carcinoma of the esophagus. This is partially obstructing. Endoscopy and biopsy recommended. Electronically Signed   By: Franchot Gallo M.D.   On: 12/25/2015 14:54   Dg Chest Port 1 View  12/26/2015  CLINICAL DATA:  82 year old male with shortness of Breath, dysphagia, evidence of esophageal  tumor on recent esophagram. Initial encounter. EXAM: PORTABLE CHEST 1  VIEW COMPARISON:  Chest radiographs 09/21/2015.  Esophagram 12/25/2015. FINDINGS: Portable AP semi upright view at 0842 hours. Small a moderate left pleural effusion. Trace right pleural effusion. Mediastinal contours are stable since November. Visualized tracheal air column is within normal limits. No pneumothorax or pulmonary edema. Aside from the dense opacification of the left lung base, no confluent pulmonary opacity. IMPRESSION: Small to moderate left pleural effusion with left lung base collapse or consolidation. Electronically Signed   By: Genevie Ann M.D.   On: 12/26/2015 08:52   I reviewed EGD report ASSESSMENT & PLAN:   Esophageal mass, biopsy pending Right upper quadrant fullness Findings are suspicious for esophageal cancer and I shared my concerns with the patient Biopsies are pending. I agree with plan for CT scan of the chest, abdomen and pelvis for further evaluation The patient has a lot of questions regarding prognosis. I told him I am not able to give him an informed decision until biopsy results are back and report of CT scans are available I plan to return tomorrow at 4 PM for further discussion with the patient and hopefully his son will be available The patient has indicated that he does not want chemotherapy if he is diagnosed with cancer  Weight loss with protein calorie malnutrition secondary to dysphagia He has difficulties with dysphagia causing weight loss Recommend nutritional consult with dietary supplement Depending on outcome of results above, he may or may not need a feeding tube  Esophagitis with GI bleed He is on Protonix  Anemia due to Gi bleed No transfusion is needed for now  CAD on anti-platelet agents Plavix was discontinued Consider stopping aspirin in view of significant esophagitis  Bilateral lower extremity cellulitis He is on IV antibiotic therapy  Discharge planning Not ready to go home, pending further evaluation I will return tomorrow  once we have more test results available  All questions were answered. The patient knows to call the clinic with any problems, questions or concerns.    [place], [place], MD 12/27/2015 4:20 PM

## 2015-12-27 NOTE — Op Note (Signed)
[facility] [place], [id number]   ENDOSCOPY PROCEDURE REPORT  PATIENT: Sean Figueroa, Sean Figueroa  MR#: [id number] BIRTHDATE: Oct 04, 1933 , 82  yrs. old GENDER: male ENDOSCOPIST: Harl Bowie, MD REFERRED BY:  [facility] PROCEDURE DATE:  12/27/2015 PROCEDURE:  EGD w/ biopsy and EGD w/ fb removal ASA CLASS:     Class IV INDICATIONS:  dysphagia. MEDICATIONS: Monitored anesthesia care TOPICAL ANESTHETIC: none  DESCRIPTION OF PROCEDURE: After the risks benefits and alternatives of the procedure were thoroughly explained, informed consent was obtained.  The PENTAX GASTOROSCOPE [id number] endoscope was introduced through the mouth and advanced to the second portion of the duodenum , Without limitations.  The instrument was slowly withdrawn as the mucosa was fully examined.    Polypoid ulcerated mass 32 to 36cm just above EG junction.  Small sliding hiatal hernia.  Esophagus with retained solids and liquids, suctioned the liquids and was able to push the rest through the EG junction.  Multiple biopsies were obtained from the mass.  Gastric mucosa and duodenum appeared normal.  Retroflexed views revealed a hiatal hernia.     The scope was then withdrawn from the patient and the procedure completed.  COMPLICATIONS: There were no immediate complications.  ENDOSCOPIC IMPRESSION: Polypoid ulcerated mass 32 to 36cm just above EG junction.  Small sliding hiatal hernia.  Esophagus with retained solids and liquids, suctioned the liquids and was able to push the rest through the EG junction.  Multiple biopsies were obtained from the mass.  Gastric mucosa and duodenum appeared normal  RECOMMENDATIONS: Await pathology results Full liquid diet CT chest, abd & pelvis Oncology Consult Please call with any questions    eSigned:  Harl Bowie, MD 12/27/2015 1:30 PM      PATIENT NAME:  Sean Figueroa, Sean Figueroa MR#: [id number]

## 2015-12-27 NOTE — Progress Notes (Signed)
[facility]                                                                              Patient Demographics  Sean Figueroa, is a 82 y.o. male, DOB - April 23, 1933, [id number]  Admit date - 12/24/2015   Admitting Physician Ripudeep Krystal Eaton, MD  Outpatient Primary MD for the patient is [name], MD  LOS - 3   Chief Complaint  Patient presents with  . Melena  . Emesis  . Neck Pain       Brief HPI   Patient is a 82 year old male with CAD, on dual antiplatelet therapy with aspirin and Plavix, hypertension, hyperlipidemia, GERD, COPD, tobacco abuse Presented to ED with nausea, vomiting, melanotic stools. History was obtained from the patient who reported that he's been having nausea and vomiting for the last 6 days and has been having difficulty keeping anything down. He has no abdominal pain, no hematemesis. He kept trying to keep himself hydrated by drinking a lot of fluids. Since yesterday patient noticed having dark melanotic stools. He takes aspirin and Plavix. He noticed dizziness since yesterday but no chest pain or shortness of breath. He denied any recent endoscopy procedures. In ED patient was noted to have sodium of 133, potassium 3.4, creatinine 1.2, WBCs 12.7, hemoglobin 13.6, FOBT positive. MCV 101.8  No chest x-ray available.    Assessment & Plan   Principal Problem:  Nausea, vomiting with GI bleed, dysphagia: Has underlying history of GERD, on dual antiplatelet therapy, denies any recent NSAID use - Esophagogram 2/6 showed irregular mass in the distal esophagus consistent with carcinoma of the esophagus, partially obstructing  - Continue to hold aspirin and Plavix - Continue Protonix  - GI following, planning endoscopy today   Active Problems: Aspiration pneumonia- improving - Complaining of coughing with yellowish phlegm, chest x-ray shows left lung small to moderate pleural effusion with consolidation - Placed on DuoNeb's, IV Zosyn, -  repeat  chest x-ray in 24-48 hours, if no significant improvement or worsening of pleural effusion, may attempt thoracentesis - O2 sats 92% on 2 L - Continue Lasix 20 mg daily   Tobacco abuse - Patient counseled strongly on tobacco cessation, placed on nicotine patch   Hypokalemia - Replaced  (HCC)/ischemic heart disease, MI, DES in 2013 to circumflex and left main - Hold aspirin, Plavix, - Restarted Coreg, Lasix   COPD, severe ([place]) - Patient still continues to smoke, recommended strongly to quit smoking - Placed on O2, DuoNeb   Chronic systolic heart failure ([place]) - 2-D echo 11/15 had shown EF of [id number], grade 1 diastolic dysfunction - Continue to hold aspirin and Plavix for endoscopy - Restart Lasix, Coreg - Hold Avapro, Imdur   GERD (gastroesophageal reflux disease) - Continue PPI  Deconditioning - PTOT evaluation prior to discharge  DVT prophylaxis: SCDs   Code Status: Full CODE STATUS  Family Communication: Discussed in detail with the patient, all imaging results, lab results explained to the patient   Disposition Plan:  Cont to monitor in SDU today  Time Spent in minutes   25 minutes  Procedures  CXR esophagogram   Consults  GI   DVT Prophylaxis  SCD's  Medications  Scheduled Meds: . aspirin EC  81 mg Oral Daily  . carvedilol  3.125 mg Oral BID WC  . feeding supplement (ENSURE ENLIVE)  237 mL Oral BID BM  . furosemide  20 mg Oral Daily  . ipratropium-albuterol  3 mL Nebulization Q4H  . nicotine  21 mg Transdermal Daily  . pantoprazole  40 mg Oral BID AC  . piperacillin-tazobactam (ZOSYN)  IV  3.375 g Intravenous Q8H  . pramipexole  0.125 mg Oral QHS   Continuous Infusions:   PRN Meds:.acetaminophen **OR** acetaminophen, ALPRAZolam, guaiFENesin-dextromethorphan, HYDROmorphone (DILAUDID) injection, meclizine, ondansetron **OR** ondansetron (ZOFRAN) IV   Antibiotics   Anti-infectives    Start     Dose/Rate Route Frequency Ordered Stop    12/26/15 1200  piperacillin-tazobactam (ZOSYN) IVPB 3.375 g     3.375 g 12.5 mL/hr over 240 Minutes Intravenous Every 8 hours 12/26/15 1024          Subjective:   Alban Minchin was seen and examined today.  Feeling somewhat better today, lung sounds improving, no fevers or chills. Coughing is better. No nausea, vomiting, or bleeding overnight. Patient denies dizziness, new weakness, numbess, tingling.   Objective:   Blood pressure 116/74, pulse 79, temperature 98.2 F (36.8 C), temperature source Oral, resp. rate 33, height 5\' 8"  (1.727 m), weight 70.308 kg (155 lb), SpO2 92 %.  Wt Readings from Last 3 Encounters:  12/27/15 70.308 kg (155 lb)  12/13/15 72.576 kg (160 lb)  10/09/15 72.576 kg (160 lb)     Intake/Output Summary (Last 24 hours) at 12/27/15 1005 Last data filed at 12/27/15 [id number]  Gross per 24 hour  Intake 1177.5 ml  Output   1770 ml  Net -592.5 ml    Exam  General: Alert and oriented x 3, NAD, hearing deficit  HEENT:  PERRLA, EOMI  Neck: Supple, no JVD, no masses  CVS: S1 S2 clear, RRR  Respiratory: Decreased breath sounds with mild scattered rhonchi  Abdomen: Soft, nontender, nondistended, + bowel sounds  Ext: no cyanosis clubbing or edema  Neuro: no new neuro deficits  Skin: No rashes  Psych: Normal affect and demeanor, alert and oriented x3    Data Review   Micro Results Recent Results (from the past 240 hour(s))  MRSA PCR Screening     Status: None   Collection Time: 12/25/15  8:53 AM  Result Value Ref Range Status   MRSA by PCR NEGATIVE NEGATIVE Final    Comment:        The GeneXpert MRSA Assay (FDA approved for NASAL specimens only), is one component of a comprehensive MRSA colonization surveillance program. It is not intended to diagnose MRSA infection nor to guide or monitor treatment for MRSA infections.     Radiology Reports Dg Esophagus  12/25/2015  CLINICAL DATA:  Dysphagia.  Nausea and vomiting. EXAM:  ESOPHOGRAM/BARIUM SWALLOW TECHNIQUE: Single contrast examination was performed using  thin barium. FLUOROSCOPY TIME:  Radiation Exposure Index (as provided by the fluoroscopic device): If the device does not provide the exposure index: Fluoroscopy Time:  1 minutes 54 seconds Number of Acquired Images: COMPARISON:  None. FINDINGS: The patient was very short of breath and had difficulty swallowing barium. There is an irregular narrowing of the distal esophagus. The esophagus is dilated above the narrowing. Barium did pass through the area of narrowing which shows mucosal irregularity and is worrisome for carcinoma of the distal esophagus. This appears be partially obstructing. Limited  evaluation of the remainder of the esophagus. Endoscopy recommended. IMPRESSION: Irregular mass distal esophagus consistent with carcinoma of the esophagus. This is partially obstructing. Endoscopy and biopsy recommended. Electronically Signed   By: Franchot Gallo M.D.   On: 12/25/2015 14:54   Dg Chest Port 1 View  12/26/2015  CLINICAL DATA:  82 year old male with shortness of Breath, dysphagia, evidence of esophageal tumor on recent esophagram. Initial encounter. EXAM: PORTABLE CHEST 1 VIEW COMPARISON:  Chest radiographs 09/21/2015.  Esophagram 12/25/2015. FINDINGS: Portable AP semi upright view at 0842 hours. Small a moderate left pleural effusion. Trace right pleural effusion. Mediastinal contours are stable since November. Visualized tracheal air column is within normal limits. No pneumothorax or pulmonary edema. Aside from the dense opacification of the left lung base, no confluent pulmonary opacity. IMPRESSION: Small to moderate left pleural effusion with left lung base collapse or consolidation. Electronically Signed   By: Genevie Ann M.D.   On: 12/26/2015 08:52    CBC  Recent Labs Lab 12/24/15 1540 12/25/15 0425 12/25/15 1419 12/25/15 1954 12/26/15 0348 12/27/15 0300  WBC 12.7* 11.3*  --   --   --  8.1  HGB 13.6 12.5*  12.9* 12.1* 11.9*  12.1* 11.4*  HCT 40.0 37.2* 38.7* 37.3* 36.4*  36.4* 34.5*  PLT 205 179  --   --   --  155  MCV 101.8* 102.5*  --   --   --  99.7  MCH 34.6* 34.4*  --   --   --  32.9  MCHC 34.0 33.6  --   --   --  33.0  RDW 13.7 13.8  --   --   --  13.5  LYMPHSABS  --  1.9  --   --   --   --   MONOABS  --  0.9  --   --   --   --   EOSABS  --  0.2  --   --   --   --   BASOSABS  --  0.0  --   --   --   --     Chemistries   Recent Labs Lab 12/24/15 1540 12/25/15 0624 12/26/15 0348 12/27/15 0300  NA 133* 137 136 140  K 3.4* 3.2* 3.7 3.6  CL 91* 100* 100* 100*  CO2 29 28 25 29   GLUCOSE 148* 110* 113* 109*  BUN 21* 14 7 8   CREATININE 1.20 0.93 0.84 0.85  CALCIUM 8.7* 8.2* 8.6* 8.5*  AST 16  --   --   --   ALT 12*  --   --   --   ALKPHOS 68  --   --   --   BILITOT 1.0  --   --   --    ------------------------------------------------------------------------------------------------------------------ estimated creatinine clearance is 67.1 mL/min (by C-G formula based on Cr of 0.85). ------------------------------------------------------------------------------------------------------------------ No results for input(s): HGBA1C in the last 72 hours. ------------------------------------------------------------------------------------------------------------------ No results for input(s): CHOL, HDL, LDLCALC, TRIG, CHOLHDL, LDLDIRECT in the last 72 hours. ------------------------------------------------------------------------------------------------------------------ No results for input(s): TSH, T4TOTAL, T3FREE, THYROIDAB in the last 72 hours.  Invalid input(s): FREET3 ------------------------------------------------------------------------------------------------------------------ No results for input(s): VITAMINB12, FOLATE, FERRITIN, TIBC, IRON, RETICCTPCT in the last 72 hours.  Coagulation profile  Recent Labs Lab 12/24/15 1730  INR 1.06    No results for input(s):  DDIMER in the last 72 hours.  Cardiac Enzymes No results for input(s): CKMB, TROPONINI, MYOGLOBIN in the last 168 hours.  Invalid input(s): CK ------------------------------------------------------------------------------------------------------------------ Invalid input(s):  POCBNP  No results for input(s): GLUCAP in the last 72 hours.   RAI,RIPUDEEP M.D. [facility] 12/27/2015, 10:05 AM  Pager: [phone] Between 7am to 7pm - call Pager - [phone]  After 7pm go to [email or web address] - password [email or web address]  Call night coverage person covering after 7pm

## 2015-12-27 NOTE — Interval H&P Note (Signed)
History and Physical Interval Note:  12/27/2015 12:34 PM  Sean Figueroa  has presented today for surgery, with the diagnosis of ; dysphagia  The various methods of treatment have been discussed with the patient and family. After consideration of risks, benefits and other options for treatment, the patient has consented to  Procedure(s): ESOPHAGOGASTRODUODENOSCOPY (EGD) WITH PROPOFOL (N/A) as a surgical intervention .  The patient's history has been reviewed, patient examined, no change in status, stable for surgery.  I have reviewed the patient's chart and labs.  Questions were answered to the patient's satisfaction.     Kavitha Nandigam

## 2015-12-27 NOTE — Transfer of Care (Signed)
Immediate Anesthesia Transfer of Care Note  Patient: Sean Figueroa  Procedure(s) Performed: Procedure(s): ESOPHAGOGASTRODUODENOSCOPY (EGD) WITH PROPOFOL (N/A)  Patient Location: Endoscopy Unit  Anesthesia Type:MAC  Level of Consciousness: awake, alert  and oriented  Airway & Oxygen Therapy: Patient Spontanous Breathing and Patient connected to nasal cannula oxygen  Post-op Assessment: Report given to RN and Post -op Vital signs reviewed and stable  Post vital signs: Reviewed and stable  Last Vitals:  Filed Vitals:   12/27/15 0900 12/27/15 1250  BP: 128/68 119/82  Pulse: 88 80  Temp: 36.7 C 37 C  Resp: 28 24    Complications: No apparent anesthesia complications

## 2015-12-27 NOTE — H&P (View-Only) (Signed)
Daily Rounding Note  12/26/2015, 8:10 AM  LOS: 2 days   SUBJECTIVE:       No pain.  Coughing up yellow sputum.  No vomiting.  No stool  OBJECTIVE:         Vital signs in last 24 hours:    Temp:  [97.4 F (36.3 C)-99.2 F (37.3 C)] 97.4 F (36.3 C) (02/07 0401) Pulse Rate:  [82-112] 91 (02/07 0600) Resp:  [20-39] 29 (02/07 0600) BP: (107-137)/(56-81) 137/70 mmHg (02/07 0600) SpO2:  [93 %-100 %] 95 % (02/07 0744) Weight:  [68.675 kg (151 lb 6.4 oz)] 68.675 kg (151 lb 6.4 oz) (02/06 0822) Last BM Date: 12/24/15 Filed Weights   12/25/15 0822  Weight: 68.675 kg (151 lb 6.4 oz)   General: pleasant, chronically ill looking.  Comfortable.  HOH.     Heart: irreg, irreg Chest: diminished BS but clear. Thin yellow sputum observed in cup at bedside.  Abdomen: soft, active BS.  NT, ND.   Extremities: no CCE Neuro/Psych:  HOH.  Pleasant, cooperative.   Intake/Output from previous day: 02/06 0701 - 02/07 0700 In: 2525 [P.O.:700; I.V.:1525; IV Piggyback:300] Out: 600 [Urine:600]  Intake/Output this shift:    Lab Results:  Recent Labs  12/24/15 1540 12/25/15 0425 12/25/15 1419 12/25/15 1954 12/26/15 0348  WBC 12.7* 11.3*  --   --   --   HGB 13.6 12.5* 12.9* 12.1* 11.9*  12.1*  HCT 40.0 37.2* 38.7* 37.3* 36.4*  36.4*  PLT 205 179  --   --   --    BMET  Recent Labs  12/24/15 1540 12/25/15 0624 12/26/15 0348  NA 133* 137 136  K 3.4* 3.2* 3.7  CL 91* 100* 100*  CO2 29 28 25   GLUCOSE 148* 110* 113*  BUN 21* 14 7  CREATININE 1.20 0.93 0.84  CALCIUM 8.7* 8.2* 8.6*   LFT  Recent Labs  12/24/15 1540  PROT 6.0*  ALBUMIN 3.1*  AST 16  ALT 12*  ALKPHOS 68  BILITOT 1.0   PT/INR  Recent Labs  12/24/15 1730  LABPROT 14.0  INR 1.06   Hepatitis Panel No results for input(s): HEPBSAG, HCVAB, HEPAIGM, HEPBIGM in the last 72 hours.  Studies/Results: Dg Esophagus  12/25/2015  CLINICAL DATA:   Dysphagia.  Nausea and vomiting. EXAM: ESOPHOGRAM/BARIUM SWALLOW TECHNIQUE: Single contrast examination was performed using  thin barium. FLUOROSCOPY TIME:  Radiation Exposure Index (as provided by the fluoroscopic device): If the device does not provide the exposure index: Fluoroscopy Time:  1 minutes 54 seconds Number of Acquired Images: COMPARISON:  None. FINDINGS: The patient was very short of breath and had difficulty swallowing barium. There is an irregular narrowing of the distal esophagus. The esophagus is dilated above the narrowing. Barium did pass through the area of narrowing which shows mucosal irregularity and is worrisome for carcinoma of the distal esophagus. This appears be partially obstructing. Limited evaluation of the remainder of the esophagus. Endoscopy recommended. IMPRESSION: Irregular mass distal esophagus consistent with carcinoma of the esophagus. This is partially obstructing. Endoscopy and biopsy recommended. Electronically Signed   By: Franchot Gallo M.D.   On: 12/25/2015 14:54   Scheduled Meds: . aspirin EC  81 mg Oral Daily  . ipratropium-albuterol  3 mL Nebulization Q4H  . nicotine  21 mg Transdermal Daily  . pantoprazole  40 mg Oral BID AC  . pramipexole  0.125 mg Oral QHS   Continuous Infusions: . sodium chloride  75 mL/hr at 12/26/15 0010   PRN Meds:.acetaminophen **OR** acetaminophen, ALPRAZolam, guaiFENesin-dextromethorphan, HYDROmorphone (DILAUDID) injection, meclizine, ondansetron **OR** ondansetron (ZOFRAN) IV   ASSESMENT:   *  N/V, melenic stool, dysphagia.  FOBT +.No PPI and only recent initiation Ranitidine PTA.  Now on BID PPI.  Distal esophageal mass on 2/6 esophagram.  *  Chronic Plavix (last dose 2/5), ASA for hx a fib, DES 2013.  Plavix on hold, 83ASA continues.   *  Macrocytosis (MCV 101), Hgb relatively stable.  No ETOH at home.  No hx of B12 or folate supplementation.   *  EF 35%  *  Severe COPD, on hone oxygen   *  Hypokalemia,  corrected.   PLAN   *  EGD, biopsy tomorrow with MAC.  Last took Plavix 2/5 *  EGD tomorrow, full liquids today.  *  BID PO Protonix.  *  CBC in AM.     Azucena Freed  12/26/2015, 8:10 AM Pager: [phone]   Attending physician's note   I have taken an interval history, reviewed the chart and examined the patient. I agree with the Advanced Practitioner's note, impression and recommendations.  Barium esophagram concerning for EG junction mass lesion. Plan for EGD tomorrow  with MAC  NPO after midnight  K Denzil Magnuson, MD [phone] Mon-Fri 8a-5p [phone] after 5p, weekends, holidays

## 2015-12-28 ENCOUNTER — Other Ambulatory Visit: Payer: Self-pay | Admitting: Hematology and Oncology

## 2015-12-28 ENCOUNTER — Encounter (HOSPITAL_COMMUNITY): Payer: Self-pay | Admitting: Gastroenterology

## 2015-12-28 DIAGNOSIS — K209 Esophagitis, unspecified: Secondary | ICD-10-CM

## 2015-12-28 DIAGNOSIS — C159 Malignant neoplasm of esophagus, unspecified: Secondary | ICD-10-CM

## 2015-12-28 DIAGNOSIS — C155 Malignant neoplasm of lower third of esophagus: Secondary | ICD-10-CM | POA: Insufficient documentation

## 2015-12-28 LAB — BASIC METABOLIC PANEL
ANION GAP: 6 (ref 5–15)
BUN: 6 mg/dL (ref 6–20)
CALCIUM: 8.7 mg/dL — AB (ref 8.9–10.3)
CO2: 34 mmol/L — ABNORMAL HIGH (ref 22–32)
CREATININE: 0.96 mg/dL (ref 0.61–1.24)
Chloride: 98 mmol/L — ABNORMAL LOW (ref 101–111)
GLUCOSE: 99 mg/dL (ref 65–99)
Potassium: 3.4 mmol/L — ABNORMAL LOW (ref 3.5–5.1)
Sodium: 138 mmol/L (ref 135–145)

## 2015-12-28 MED ORDER — POTASSIUM CHLORIDE 10 MEQ/100ML IV SOLN
10.0000 meq | INTRAVENOUS | Status: AC
  Administered 2015-12-28 (×2): 10 meq via INTRAVENOUS
  Filled 2015-12-28 (×2): qty 100

## 2015-12-28 MED ORDER — ALBUTEROL SULFATE (2.5 MG/3ML) 0.083% IN NEBU
2.5000 mg | INHALATION_SOLUTION | RESPIRATORY_TRACT | Status: DC | PRN
  Administered 2015-12-29 – 2015-12-30 (×2): 2.5 mg via RESPIRATORY_TRACT
  Filled 2015-12-28 (×2): qty 3

## 2015-12-28 MED ORDER — IPRATROPIUM-ALBUTEROL 0.5-2.5 (3) MG/3ML IN SOLN
3.0000 mL | Freq: Four times a day (QID) | RESPIRATORY_TRACT | Status: DC
  Administered 2015-12-28 – 2016-01-01 (×17): 3 mL via RESPIRATORY_TRACT
  Filled 2015-12-28 (×17): qty 3

## 2015-12-28 NOTE — Progress Notes (Signed)
UR COMPLETED  

## 2015-12-28 NOTE — Progress Notes (Signed)
Daily Rounding Note  12/28/2015, 8:18 AM  LOS: 4 days    SUBJECTIVE:       Tolerating liquids.  No pain.  No coughing  OBJECTIVE:         Vital signs in last 24 hours:    Temp:  [97.6 F (36.4 C)-98.7 F (37.1 C)] 98.7 F (37.1 C) (02/09 0400) Pulse Rate:  [33-88] 62 (02/09 0800) Resp:  [20-35] 25 (02/09 0800) BP: (119-152)/(65-89) 130/83 mmHg (02/09 0800) SpO2:  [91 %-100 %] 95 % (02/09 0806) FiO2 (%):  [93 %] 93 % (02/09 0454) Last BM Date: 12/25/15 Filed Weights   12/25/15 0822 12/27/15 0400  Weight: 68.675 kg (151 lb 6.4 oz) 70.308 kg (155 lb)   General: pleasant, comfortable.  Not acutely ill looking.   Heart: RRR Chest: clear bil.  No cough. Abdomen: soft, NT, ND.  BS active  Extremities: no CCE Neuro/Psych:  Oriented x 3.  Alert.  Very HOH  Intake/Output from previous day: 02/08 0701 - 02/09 0700 In: 1867 [P.O.:1717; IV Piggyback:150] Out: 1105 [Urine:1095; Blood:10]  Intake/Output this shift:    Lab Results:  Recent Labs  12/25/15 1954 12/26/15 0348 12/27/15 0300  WBC  --   --  8.1  HGB 12.1* 11.9*  12.1* 11.4*  HCT 37.3* 36.4*  36.4* 34.5*  PLT  --   --  155   BMET  Recent Labs  12/26/15 0348 12/27/15 0300 12/28/15 0540  NA 136 140 138  K 3.7 3.6 3.4*  CL 100* 100* 98*  CO2 25 29 34*  GLUCOSE 113* 109* 99  BUN 7 8 6   CREATININE 0.84 0.85 0.96  CALCIUM 8.6* 8.5* 8.7*    Studies/Results: Ct Chest W Contrast Ct Abdomen Pelvis W Contrast 12/27/2015  CLINICAL DATA:  Esophageal mass EXAM: CT CHEST, ABDOMEN, AND PELVIS WITH CONTRAST TECHNIQUE: Multidetector CT imaging of the chest, abdomen and pelvis was performed following the standard protocol during bolus administration of intravenous contrast. CONTRAST:  149mL OMNIPAQUE IOHEXOL 300 MG/ML  SOLN COMPARISON:  Esophagram dated 12/25/2015. FINDINGS: CT CHEST FINDINGS Mediastinum/Nodes: Heart is top-normal in size. Thinning at the left  ventricular apex, suggesting prior myocardial infarction. Again noted is low density at the left ventricular apex which could reflect thrombus (series 2/ image 45). Three vessel coronary atherosclerosis. Atherosclerotic calcifications of the aortic arch. Small mediastinal lymph nodes, including a 6 mm subcarinal node, within normal limits. Polypoid mass along the anterior aspect of the distal esophagus just above the GE junction (series 2/ image 42), corresponding to the biopsied lesion on endoscopy. Visualized thyroid is unremarkable. Lungs/Pleura: Chronic lingular opacity/collapse (series 3/ image 35). Small left pleural effusion, mildly increased from 2014, with associated left lower lobe atelectasis. Mild subpleural scarring in the anterior left upper lobe (series 3/ image 21). Additional mild scarring/atelectasis in the medial right middle lobe (series 3/image 46). Underlying mild emphysematous changes. Trace right pleural effusion. No pneumothorax. Musculoskeletal: Degenerative changes of the thoracic spine. CT ABDOMEN PELVIS FINDINGS Hepatobiliary: Unenhanced liver is unremarkable. Status post cholecystectomy. No intrahepatic or extrahepatic ductal dilatation. Pancreas: Pancreatic atrophy. Spleen: Within normal limits. Adrenals/Urinary Tract: 1.6 cm left adrenal adenoma (series 2/image 60), unchanged. 1.3 cm right adrenal adenoma (series 2/ image 69), unchanged. Kidneys are unremarkable. No renal, ureteral, or bladder calculi. Bladder is within normal limits. Stomach/Bowel: Stomach is notable for a small hiatal hernia. No evidence of bowel obstruction. Extensive colonic diverticulosis, without evidence of diverticulitis. Mild concentric wall  thickening along the rectum (series 2/ image 108), favored to reflect underdistention. Vascular/Lymphatic: Atherosclerotic calcifications of the abdominal aorta and branch vessels. Fusiform ectasia with saccular outpouching of the infrarenal abdominal aorta, measuring 3.1  x 2.3 cm (series 2/image 72), previously 3.0 x 2.5 cm in 2014. No suspicious abdominopelvic lymphadenopathy, although evaluation is mildly constrained due to lack of intravenous contrast administration. Reproductive: Prostate is notable for mild enlargement of the central gland which indents the base of the bladder. Other: No abdominopelvic ascites. Postsurgical changes related to prior left inguinal hernia repair. Musculoskeletal: Degenerative changes of the lumbar spine. IMPRESSION: Evaluation is constrained by lack of intravenous contrast administration. Polypoid mass along the anterior aspect of the distal esophagus above the GE junction. No findings specific for metastatic disease on unenhanced CT. Small left and trace right pleural effusions. Associated left lower lobe atelectasis. Chronic scarring/ atelectasis in the lingula. Bilateral adrenal adenomas. Additional ancillary findings as above. Electronically Signed   By: Julian Hy M.D.   On: 12/27/2015 21:18   Dg Chest Port 1 View  12/26/2015  CLINICAL DATA:  82 year old male with shortness of Breath, dysphagia, evidence of esophageal tumor on recent esophagram. Initial encounter. EXAM: PORTABLE CHEST 1 VIEW COMPARISON:  Chest radiographs 09/21/2015.  Esophagram 12/25/2015. FINDINGS: Portable AP semi upright view at 0842 hours. Small a moderate left pleural effusion. Trace right pleural effusion. Mediastinal contours are stable since November. Visualized tracheal air column is within normal limits. No pneumothorax or pulmonary edema. Aside from the dense opacification of the left lung base, no confluent pulmonary opacity. IMPRESSION: Small to moderate left pleural effusion with left lung base collapse or consolidation. Electronically Signed   By: Genevie Ann M.D.   On: 12/26/2015 08:52   Scheduled Meds: . aspirin EC  81 mg Oral Daily  . carvedilol  3.125 mg Oral BID WC  . feeding supplement (ENSURE ENLIVE)  237 mL Oral BID BM  . furosemide  20 mg  Oral Daily  . ipratropium-albuterol  3 mL Nebulization QID  . nicotine  21 mg Transdermal Daily  . pantoprazole  40 mg Oral BID AC  . piperacillin-tazobactam (ZOSYN)  IV  3.375 g Intravenous Q8H  . potassium chloride  10 mEq Intravenous Q1 Hr x 2  . pramipexole  0.125 mg Oral QHS   Continuous Infusions:  PRN Meds:.acetaminophen **OR** acetaminophen, ALPRAZolam, guaiFENesin-dextromethorphan, HYDROmorphone (DILAUDID) injection, meclizine, ondansetron **OR** ondansetron (ZOFRAN) IV  ASSESMENT:   *  Distal esophageal mass.  Dysphagia 2/8 EGD with biopsy of mass and suctioning of retained liquids/solids. MD able to pass scope.   CT chest, ab, pelvis: mass seen, no mets evident on non-contrast study.  Pathology showing well-differentiated adenoca arising from [facility], Dr Alvy Bimler, following.  On BID po Protonix.   *  ASA, Plavix PTA.  Later on hold.  Hx a fib and 2013 DES.   *  EF 35%  *  Aspiration PNA?Marland Kitchen  Small to tiny bil pleural effusions, LLL atx per CT.   Day 3 abx.  WBCs normal.  No fever.   *  Hypokalemia.   PLAN   *  ? Duration of abx therapy.   *  ? G tube as anticipate pt's dysphagia to worsen? At present scope able to pass beyond the mass, so PEG is doable. Once we have pathology and oncology weighs in on therapy, Palliative care consult might be of benefit.   *   Continue full liquids.      Azucena Freed  12/28/2015, 8:18 AM Pager: [phone]   Attending physician's note   I have taken an interval history, reviewed the chart and examined the patient. I agree with the Advanced Practitioner's note, impression and recommendations.   Distal esophageal mass arising from GE junction consistent with adenocarcinoma. Patient has refused chemotherapy. He is not a surgical candidate. Given his progressive dysphagia, we can offer a placement of PEG tube to maintain his nutritional status. Please call palliative consult  Damaris Hippo, MD [phone] Mon-Fri  8a-5p [phone] after 5p, weekends, holidays

## 2015-12-28 NOTE — Anesthesia Preprocedure Evaluation (Addendum)
Anesthesia Evaluation  Patient identified by MRN, date of birth, ID band Patient awake    Reviewed: NPO status , Patient's Chart, lab work & pertinent test results  Airway Mallampati: II  TM Distance: >3 FB Neck ROM: Full    Dental   Pulmonary asthma , COPD, Current Smoker,    breath sounds clear to auscultation       Cardiovascular hypertension, + angina + CAD, + Past MI, + Peripheral Vascular Disease and +CHF   Rhythm:Regular Rate:Normal     Neuro/Psych    GI/Hepatic Neg liver ROS, GERD  ,  Endo/Other  negative endocrine ROS  Renal/GU negative Renal ROS     Musculoskeletal   Abdominal   Peds  Hematology   Anesthesia Other Findings   Reproductive/Obstetrics                            Anesthesia Physical Anesthesia Plan  ASA: III  Anesthesia Plan: MAC   Post-op Pain Management:    Induction: Intravenous  Airway Management Planned: Simple Face Mask  Additional Equipment:   Intra-op Plan:   Post-operative Plan:   Informed Consent: I have reviewed the patients History and Physical, chart, labs and discussed the procedure including the risks, benefits and alternatives for the proposed anesthesia with the patient or authorized representative who has indicated his/her understanding and acceptance.   Dental advisory given  Plan Discussed with: CRNA and Anesthesiologist  Anesthesia Plan Comments:         Anesthesia Quick Evaluation

## 2015-12-28 NOTE — Evaluation (Signed)
Physical Therapy Evaluation Patient Details Name: Sean Figueroa MRN: [id number] DOB: February 10, 1933 Today's Date: 12/28/2015   History of Present Illness  Sean Figueroa is a 82 y.o. male with a Past Medical History of CAD, HLD, HTN, A. fib, systolic CHF, MI, COPD oxygen dependent who presents with hypercapnic respiratory failure secondary to COPD exacerbation.  Clinical Impression  Pt admitted with above diagnosis. Pt currently with functional limitations due to the deficits listed below (see PT Problem List). Pt able to ambulate 100 ft with RW min guard but c/o SOB at about 50 feet (O2 Sats at 89% on 3L [place]). Pt was safe with all functional mobility with RW with mild unsteadiness due to generalized weakness. Pt will benefit from skilled PT to increase their independence and safety with mobility to allow discharge to the venue listed below.       Follow Up Recommendations Home health PT;Supervision/Assistance - 24 hour    Equipment Recommendations  None recommended by PT    Recommendations for Other Services       Precautions / Restrictions Precautions Precautions: Fall Restrictions Weight Bearing Restrictions: No Other Position/Activity Restrictions: O2      Mobility  Bed Mobility               General bed mobility comments: Pt was on EOB on arrival.  Transfers Overall transfer level: Needs assistance Equipment used: Standard walker Transfers: Sit to/from Stand;Stand Pivot Transfers Sit to Stand: Min guard Stand pivot transfers: Min guard       General transfer comment: Pt generally safe with transfers.  Cues given for hand placement when using walker.  Ambulation/Gait Ambulation/Gait assistance: Min guard Ambulation Distance (Feet): 100 Feet Assistive device: Rolling walker (2 wheeled) Gait Pattern/deviations: Step-through pattern;Decreased stride length;Trunk flexed;Narrow base of support Gait velocity: decreased   General Gait Details: Pt states he uses a cane  most of the time at home. Pt able to walk with [facility] assist for ~15 feet (with no LOB and minimal reliance on hand hold assist) but stated he felt unstable and would prefer to use the walker for now.   Stairs            Wheelchair Mobility    Modified Rankin (Stroke Patients Only)       Balance Overall balance assessment: Needs assistance Sitting-balance support: No upper extremity supported;Feet supported Sitting balance-Leahy Scale: Good     Standing balance support: Bilateral upper extremity supported;During functional activity Standing balance-Leahy Scale: Fair Standing balance comment: Pt could let go of walker for short amounts of time to go to bathroom and to stand at sink and groom.  Pt did choose to use walker to walk over the cane.             High level balance activites: Turns High Level Balance Comments: Able to turn safely with walker in small crowded bathroom.              Pertinent Vitals/Pain Pain Assessment: No/denies pain    Home Living Family/patient expects to be discharged to:: Private residence Living Arrangements: Children Available Help at Discharge: Available PRN/intermittently;Other (Comment) (close to 24 hours a day.  could do 24 hours a day for a few ) Type of Home: House Home Access: Stairs to enter Entrance Stairs-Rails: Right Entrance Stairs-Number of Steps: 2 Home Layout: One level Home Equipment: Walker - 2 wheels;Cane - single point;Bedside commode;Shower seat      Prior Function Level of Independence: Independent with assistive device(s)  Comments: pt states he used cane and was I with ADLs; has meals on wheels; neighbor helps with his son and as much as possible around the house  Pt does not drive     Hand Dominance   Dominant Hand: Right    Extremity/Trunk Assessment   Upper Extremity Assessment: Overall WFL for tasks assessed           Lower Extremity Assessment: Generalized weakness       Cervical / Trunk Assessment: Normal  Communication   Communication: HOH  Cognition Arousal/Alertness: Awake/alert Behavior During Therapy: WFL for tasks assessed/performed Overall Cognitive Status: Within Functional Limits for tasks assessed                      General Comments General comments (skin integrity, edema, etc.): Overall, pt did well with adls. Pt's sats dropped to 89% on 3 L of O2 after walking and pt did appear to have a great amount of congestion.  RT in room after OT.    Exercises        Assessment/Plan    PT Assessment Patient needs continued PT services  PT Diagnosis Abnormality of gait;Generalized weakness   PT Problem List Decreased strength;Decreased activity tolerance;Decreased balance;Cardiopulmonary status limiting activity  PT Treatment Interventions Gait training;Stair training;Functional mobility training;Therapeutic activities;Therapeutic exercise;Balance training;Patient/family education   PT Goals (Current goals can be found in the Care Plan section) Acute Rehab PT Goals Patient Stated Goal: to just feel better PT Goal Formulation: With patient Time For Goal Achievement: 01/11/16 Potential to Achieve Goals: Good    Frequency Min 3X/week   Barriers to discharge        Co-evaluation PT/OT/SLP Co-Evaluation/Treatment: Yes Reason for Co-Treatment: For patient/therapist safety PT goals addressed during session: Mobility/safety with mobility OT goals addressed during session: ADL's and self-care       End of Session Equipment Utilized During Treatment: Gait belt;Oxygen Activity Tolerance: Patient tolerated treatment well Patient left: in chair;with call bell/phone within reach;with chair alarm set Nurse Communication: Mobility status         Time: [id number] PT Time Calculation (min) (ACUTE ONLY): 25 min   Charges:   PT Evaluation $PT Eval Moderate Complexity: 1 Procedure     PT G Codes:       Colon Branch, SPT Colon Branch 12/28/2015, 2:25 PM

## 2015-12-28 NOTE — Progress Notes (Addendum)
[facility]                                                                              Patient Demographics  Sean Figueroa, is a 82 y.o. male, DOB - 1933/10/24, [id number]  Admit date - 12/24/2015   Admitting Physician Tajah Schreiner Krystal Eaton, MD  Outpatient Primary MD for the patient is [name], MD  LOS - 4   Chief Complaint  Patient presents with  . Melena  . Emesis  . Neck Pain       Brief HPI   Patient is a 82 year old male with CAD, on dual antiplatelet therapy with aspirin and Plavix, hypertension, hyperlipidemia, GERD, COPD, tobacco abuse Presented to ED with nausea, vomiting, melanotic stools. History was obtained from the patient who reported that he's been having nausea and vomiting for the last 6 days and has been having difficulty keeping anything down. He has no abdominal pain, no hematemesis. He kept trying to keep himself hydrated by drinking a lot of fluids. Since yesterday patient noticed having dark melanotic stools. He takes aspirin and Plavix. He noticed dizziness since yesterday but no chest pain or shortness of breath. He denied any recent endoscopy procedures. In ED patient was noted to have sodium of 133, potassium 3.4, creatinine 1.2, WBCs 12.7, hemoglobin 13.6, FOBT positive. MCV 101.8  No chest x-ray available.    Assessment & Plan   Principal Problem:  Nausea, vomiting with GI bleed, dysphagia: Has underlying history of GERD, on dual antiplatelet therapy, denies any recent NSAID use - Esophagogram 2/6 showed irregular mass in the distal esophagus consistent with carcinoma of the esophagus, partially obstructing. Biopsy pending.  - Continue to hold aspirin and Plavix - Continue Protonix  - CT chest, abdomen and pelvis with esophageal mass but no metastasis - Oncology following  Active Problems: Aspiration pneumonia- improving - On 2/7, patient was complaining of productive cough, CXR showed left lung small to moderate pleural  effusion with consolidation - Placed on DuoNeb's, IV Zosyn (Day 3 today)   Tobacco abuse - Patient counseled strongly on tobacco cessation, placed on nicotine patch   Hypokalemia - Replaced  (HCC)/ischemic heart disease, MI, DES in 2013 to circumflex and left main - Hold aspirin, Plavix - Restarted Coreg, Lasix   COPD, severe ([place]) - Patient still continues to smoke, recommended strongly to quit smoking - Placed on O2, DuoNeb   Chronic systolic heart failure ([place]) - 2-D echo 11/15 had shown EF of [id number], grade 1 diastolic dysfunction - Continue to hold aspirin and Plavix for endoscopy - Restart Lasix, Coreg - Hold Avapro, Imdur   GERD (gastroesophageal reflux disease) - Continue PPI  Deconditioning - PTOT evaluation prior to discharge  DVT prophylaxis: SCDs   Code Status: Full CODE STATUS  Family Communication: Discussed in detail with the patient, all imaging results, lab results explained to the patient   Disposition Plan:  Transfer to tele  Time Spent in minutes   25 minutes  Procedures  CXR esophagogram   Consults   GI   DVT Prophylaxis  SCD's  Medications  Scheduled Meds: . aspirin EC  81 mg Oral Daily  .  carvedilol  3.125 mg Oral BID WC  . feeding supplement (ENSURE ENLIVE)  237 mL Oral BID BM  . furosemide  20 mg Oral Daily  . ipratropium-albuterol  3 mL Nebulization QID  . nicotine  21 mg Transdermal Daily  . pantoprazole  40 mg Oral BID AC  . piperacillin-tazobactam (ZOSYN)  IV  3.375 g Intravenous Q8H  . pramipexole  0.125 mg Oral QHS   Continuous Infusions:   PRN Meds:.acetaminophen **OR** acetaminophen, albuterol, ALPRAZolam, guaiFENesin-dextromethorphan, HYDROmorphone (DILAUDID) injection, meclizine, ondansetron **OR** ondansetron (ZOFRAN) IV   Antibiotics   Anti-infectives    Start     Dose/Rate Route Frequency Ordered Stop   12/26/15 1200  piperacillin-tazobactam (ZOSYN) IVPB 3.375 g     3.375 g 12.5 mL/hr over 240 Minutes  Intravenous Every 8 hours 12/26/15 1024          Subjective:   Meade Edd was seen and examined today. No nausea, vomiting, or bleeding overnight. Patient denies dizziness, new weakness, numbess, tingling.   Objective:   Blood pressure 130/83, pulse 81, temperature 98.7 F (37.1 C), temperature source Oral, resp. rate 25, height 5\' 8"  (1.727 m), weight 70.308 kg (155 lb), SpO2 95 %.  Wt Readings from Last 3 Encounters:  12/27/15 70.308 kg (155 lb)  12/13/15 72.576 kg (160 lb)  10/09/15 72.576 kg (160 lb)     Intake/Output Summary (Last 24 hours) at 12/28/15 0946 Last data filed at 12/28/15 0825  Gross per 24 hour  Intake   1727 ml  Output   1185 ml  Net    542 ml    Exam  General: Alert and oriented x 3, NAD, hearing deficit  HEENT:  PERRLA, EOMI  Neck: Supple  CVS: S1 S2 clear, RRR  Respiratory: Decreased breath sounds with mild scattered rhonchi  Abdomen: Soft,NT, NBS  Ext: no cyanosis clubbing or edema  Neuro: no new neuro deficits  Skin: No rashes  Psych: Normal affect and demeanor, alert and oriented x3    Data Review   Micro Results Recent Results (from the past 240 hour(s))  MRSA PCR Screening     Status: None   Collection Time: 12/25/15  8:53 AM  Result Value Ref Range Status   MRSA by PCR NEGATIVE NEGATIVE Final    Comment:        The GeneXpert MRSA Assay (FDA approved for NASAL specimens only), is one component of a comprehensive MRSA colonization surveillance program. It is not intended to diagnose MRSA infection nor to guide or monitor treatment for MRSA infections.     Radiology Reports Ct Chest W Contrast  12/27/2015  CLINICAL DATA:  Esophageal mass EXAM: CT CHEST, ABDOMEN, AND PELVIS WITH CONTRAST TECHNIQUE: Multidetector CT imaging of the chest, abdomen and pelvis was performed following the standard protocol during bolus administration of intravenous contrast. CONTRAST:  13mL OMNIPAQUE IOHEXOL 300 MG/ML  SOLN COMPARISON:   Esophagram dated 12/25/2015. FINDINGS: CT CHEST FINDINGS Mediastinum/Nodes: Heart is top-normal in size. Thinning at the left ventricular apex, suggesting prior myocardial infarction. Again noted is low density at the left ventricular apex which could reflect thrombus (series 2/ image 45). Three vessel coronary atherosclerosis. Atherosclerotic calcifications of the aortic arch. Small mediastinal lymph nodes, including a 6 mm subcarinal node, within normal limits. Polypoid mass along the anterior aspect of the distal esophagus just above the GE junction (series 2/ image 42), corresponding to the biopsied lesion on endoscopy. Visualized thyroid is unremarkable. Lungs/Pleura: Chronic lingular opacity/collapse (series 3/ image 35). Small  left pleural effusion, mildly increased from 2014, with associated left lower lobe atelectasis. Mild subpleural scarring in the anterior left upper lobe (series 3/ image 21). Additional mild scarring/atelectasis in the medial right middle lobe (series 3/image 46). Underlying mild emphysematous changes. Trace right pleural effusion. No pneumothorax. Musculoskeletal: Degenerative changes of the thoracic spine. CT ABDOMEN PELVIS FINDINGS Hepatobiliary: Unenhanced liver is unremarkable. Status post cholecystectomy. No intrahepatic or extrahepatic ductal dilatation. Pancreas: Pancreatic atrophy. Spleen: Within normal limits. Adrenals/Urinary Tract: 1.6 cm left adrenal adenoma (series 2/image 60), unchanged. 1.3 cm right adrenal adenoma (series 2/ image 69), unchanged. Kidneys are unremarkable. No renal, ureteral, or bladder calculi. Bladder is within normal limits. Stomach/Bowel: Stomach is notable for a small hiatal hernia. No evidence of bowel obstruction. Extensive colonic diverticulosis, without evidence of diverticulitis. Mild concentric wall thickening along the rectum (series 2/ image 108), favored to reflect underdistention. Vascular/Lymphatic: Atherosclerotic calcifications of the  abdominal aorta and branch vessels. Fusiform ectasia with saccular outpouching of the infrarenal abdominal aorta, measuring 3.1 x 2.3 cm (series 2/image 72), previously 3.0 x 2.5 cm in 2014. No suspicious abdominopelvic lymphadenopathy, although evaluation is mildly constrained due to lack of intravenous contrast administration. Reproductive: Prostate is notable for mild enlargement of the central gland which indents the base of the bladder. Other: No abdominopelvic ascites. Postsurgical changes related to prior left inguinal hernia repair. Musculoskeletal: Degenerative changes of the lumbar spine. IMPRESSION: Evaluation is constrained by lack of intravenous contrast administration. Polypoid mass along the anterior aspect of the distal esophagus above the GE junction. No findings specific for metastatic disease on unenhanced CT. Small left and trace right pleural effusions. Associated left lower lobe atelectasis. Chronic scarring/ atelectasis in the lingula. Bilateral adrenal adenomas. Additional ancillary findings as above. Electronically Signed   By: Julian Hy M.D.   On: 12/27/2015 21:18   Ct Abdomen Pelvis W Contrast  12/27/2015  CLINICAL DATA:  Esophageal mass EXAM: CT CHEST, ABDOMEN, AND PELVIS WITH CONTRAST TECHNIQUE: Multidetector CT imaging of the chest, abdomen and pelvis was performed following the standard protocol during bolus administration of intravenous contrast. CONTRAST:  155mL OMNIPAQUE IOHEXOL 300 MG/ML  SOLN COMPARISON:  Esophagram dated 12/25/2015. FINDINGS: CT CHEST FINDINGS Mediastinum/Nodes: Heart is top-normal in size. Thinning at the left ventricular apex, suggesting prior myocardial infarction. Again noted is low density at the left ventricular apex which could reflect thrombus (series 2/ image 45). Three vessel coronary atherosclerosis. Atherosclerotic calcifications of the aortic arch. Small mediastinal lymph nodes, including a 6 mm subcarinal node, within normal limits.  Polypoid mass along the anterior aspect of the distal esophagus just above the GE junction (series 2/ image 42), corresponding to the biopsied lesion on endoscopy. Visualized thyroid is unremarkable. Lungs/Pleura: Chronic lingular opacity/collapse (series 3/ image 35). Small left pleural effusion, mildly increased from 2014, with associated left lower lobe atelectasis. Mild subpleural scarring in the anterior left upper lobe (series 3/ image 21). Additional mild scarring/atelectasis in the medial right middle lobe (series 3/image 46). Underlying mild emphysematous changes. Trace right pleural effusion. No pneumothorax. Musculoskeletal: Degenerative changes of the thoracic spine. CT ABDOMEN PELVIS FINDINGS Hepatobiliary: Unenhanced liver is unremarkable. Status post cholecystectomy. No intrahepatic or extrahepatic ductal dilatation. Pancreas: Pancreatic atrophy. Spleen: Within normal limits. Adrenals/Urinary Tract: 1.6 cm left adrenal adenoma (series 2/image 60), unchanged. 1.3 cm right adrenal adenoma (series 2/ image 69), unchanged. Kidneys are unremarkable. No renal, ureteral, or bladder calculi. Bladder is within normal limits. Stomach/Bowel: Stomach is notable for a small hiatal hernia. No evidence  of bowel obstruction. Extensive colonic diverticulosis, without evidence of diverticulitis. Mild concentric wall thickening along the rectum (series 2/ image 108), favored to reflect underdistention. Vascular/Lymphatic: Atherosclerotic calcifications of the abdominal aorta and branch vessels. Fusiform ectasia with saccular outpouching of the infrarenal abdominal aorta, measuring 3.1 x 2.3 cm (series 2/image 72), previously 3.0 x 2.5 cm in 2014. No suspicious abdominopelvic lymphadenopathy, although evaluation is mildly constrained due to lack of intravenous contrast administration. Reproductive: Prostate is notable for mild enlargement of the central gland which indents the base of the bladder. Other: No  abdominopelvic ascites. Postsurgical changes related to prior left inguinal hernia repair. Musculoskeletal: Degenerative changes of the lumbar spine. IMPRESSION: Evaluation is constrained by lack of intravenous contrast administration. Polypoid mass along the anterior aspect of the distal esophagus above the GE junction. No findings specific for metastatic disease on unenhanced CT. Small left and trace right pleural effusions. Associated left lower lobe atelectasis. Chronic scarring/ atelectasis in the lingula. Bilateral adrenal adenomas. Additional ancillary findings as above. Electronically Signed   By: Julian Hy M.D.   On: 12/27/2015 21:18   Dg Esophagus  12/25/2015  CLINICAL DATA:  Dysphagia.  Nausea and vomiting. EXAM: ESOPHOGRAM/BARIUM SWALLOW TECHNIQUE: Single contrast examination was performed using  thin barium. FLUOROSCOPY TIME:  Radiation Exposure Index (as provided by the fluoroscopic device): If the device does not provide the exposure index: Fluoroscopy Time:  1 minutes 54 seconds Number of Acquired Images: COMPARISON:  None. FINDINGS: The patient was very short of breath and had difficulty swallowing barium. There is an irregular narrowing of the distal esophagus. The esophagus is dilated above the narrowing. Barium did pass through the area of narrowing which shows mucosal irregularity and is worrisome for carcinoma of the distal esophagus. This appears be partially obstructing. Limited evaluation of the remainder of the esophagus. Endoscopy recommended. IMPRESSION: Irregular mass distal esophagus consistent with carcinoma of the esophagus. This is partially obstructing. Endoscopy and biopsy recommended. Electronically Signed   By: Franchot Gallo M.D.   On: 12/25/2015 14:54   Dg Chest Port 1 View  12/26/2015  CLINICAL DATA:  82 year old male with shortness of Breath, dysphagia, evidence of esophageal tumor on recent esophagram. Initial encounter. EXAM: PORTABLE CHEST 1 VIEW COMPARISON:   Chest radiographs 09/21/2015.  Esophagram 12/25/2015. FINDINGS: Portable AP semi upright view at 0842 hours. Small a moderate left pleural effusion. Trace right pleural effusion. Mediastinal contours are stable since November. Visualized tracheal air column is within normal limits. No pneumothorax or pulmonary edema. Aside from the dense opacification of the left lung base, no confluent pulmonary opacity. IMPRESSION: Small to moderate left pleural effusion with left lung base collapse or consolidation. Electronically Signed   By: Genevie Ann M.D.   On: 12/26/2015 08:52    CBC  Recent Labs Lab 12/24/15 1540 12/25/15 0425 12/25/15 1419 12/25/15 1954 12/26/15 0348 12/27/15 0300  WBC 12.7* 11.3*  --   --   --  8.1  HGB 13.6 12.5* 12.9* 12.1* 11.9*  12.1* 11.4*  HCT 40.0 37.2* 38.7* 37.3* 36.4*  36.4* 34.5*  PLT 205 179  --   --   --  155  MCV 101.8* 102.5*  --   --   --  99.7  MCH 34.6* 34.4*  --   --   --  32.9  MCHC 34.0 33.6  --   --   --  33.0  RDW 13.7 13.8  --   --   --  13.5  LYMPHSABS  --  1.9  --   --   --   --   MONOABS  --  0.9  --   --   --   --   EOSABS  --  0.2  --   --   --   --   BASOSABS  --  0.0  --   --   --   --     Chemistries   Recent Labs Lab 12/24/15 1540 12/25/15 0624 12/26/15 0348 12/27/15 0300 12/28/15 0540  NA 133* 137 136 140 138  K 3.4* 3.2* 3.7 3.6 3.4*  CL 91* 100* 100* 100* 98*  CO2 29 28 25 29  34*  GLUCOSE 148* 110* 113* 109* 99  BUN 21* 14 7 8 6   CREATININE 1.20 0.93 0.84 0.85 0.96  CALCIUM 8.7* 8.2* 8.6* 8.5* 8.7*  AST 16  --   --   --   --   ALT 12*  --   --   --   --   ALKPHOS 68  --   --   --   --   BILITOT 1.0  --   --   --   --    ------------------------------------------------------------------------------------------------------------------ estimated creatinine clearance is 59.4 mL/min (by C-G formula based on Cr of  0.96). ------------------------------------------------------------------------------------------------------------------ No results for input(s): HGBA1C in the last 72 hours. ------------------------------------------------------------------------------------------------------------------ No results for input(s): CHOL, HDL, LDLCALC, TRIG, CHOLHDL, LDLDIRECT in the last 72 hours. ------------------------------------------------------------------------------------------------------------------ No results for input(s): TSH, T4TOTAL, T3FREE, THYROIDAB in the last 72 hours.  Invalid input(s): FREET3 ------------------------------------------------------------------------------------------------------------------ No results for input(s): VITAMINB12, FOLATE, FERRITIN, TIBC, IRON, RETICCTPCT in the last 72 hours.  Coagulation profile  Recent Labs Lab 12/24/15 1730  INR 1.06    No results for input(s): DDIMER in the last 72 hours.  Cardiac Enzymes No results for input(s): CKMB, TROPONINI, MYOGLOBIN in the last 168 hours.  Invalid input(s): CK ------------------------------------------------------------------------------------------------------------------ Invalid input(s): POCBNP  No results for input(s): GLUCAP in the last 72 hours.   Yonas Bunda M.D. [facility] 12/28/2015, 9:46 AM  Pager: [phone] Between 7am to 7pm - call Pager - [phone]  After 7pm go to [email or web address] - password [email or web address]  Call night coverage person covering after 7pm

## 2015-12-28 NOTE — Care Management Important Message (Signed)
Important Message  Patient Details  Name: Sean Figueroa MRN: [id number] Date of Birth: Dec 03, 1932   Medicare Important Message Given:  Yes    Loann Quill 12/28/2015, 7:59 AM

## 2015-12-28 NOTE — Evaluation (Signed)
Occupational Therapy Evaluation Patient Details Name: Sean Figueroa MRN: [id number] DOB: 05/14/1933 Today's Date: 12/28/2015    History of Present Illness Sean Figueroa is a 82 y.o. male with a Past Medical History of CAD, HLD, HTN, A. fib, systolic CHF, MI, COPD oxygen dependent who presents with hypercapnic respiratory failure secondary to COPD exacerbation.   Clinical Impression   Pt admitted with above diagnosis and has the deficits listed below. Pt would benefit from cont OT to stress energy conservation during adls.  Pt most likely will not need OT at home if he progresses in acute care.  Feel pt will be safe to return home.    Follow Up Recommendations  Home health OT    Equipment Recommendations  None recommended by OT    Recommendations for Other Services       Precautions / Restrictions Restrictions Weight Bearing Restrictions: No Other Position/Activity Restrictions: O2      Mobility Bed Mobility               General bed mobility comments: Pt was on EOB on arrival.  Transfers Overall transfer level: Needs assistance Equipment used: Standard walker Transfers: Sit to/from Stand;Stand Pivot Transfers Sit to Stand: Min guard Stand pivot transfers: Min guard       General transfer comment: Pt generally safe with transfers.  Cues given for hand placement when using walker.    Balance Overall balance assessment: Needs assistance Sitting-balance support: No upper extremity supported;Feet supported Sitting balance-Leahy Scale: Good     Standing balance support: Bilateral upper extremity supported;During functional activity Standing balance-Leahy Scale: Fair Standing balance comment: Pt could let go of walker for short amounts of time to go to bathroom and to stand at sink and groom.  Pt did choose to use walker to walk over the cane.                            ADL Overall ADL's : Needs assistance/impaired Eating/Feeding:  Independent;Sitting   Grooming: Supervision/safety;Standing   Upper Body Bathing: Set up;Sitting   Lower Body Bathing: Supervison/ safety;Sit to/from stand   Upper Body Dressing : Set up;Sitting   Lower Body Dressing: Supervision/safety;Sit to/from stand   Toilet Transfer: Min guard;Comfort height toilet;Ambulation;RW Toilet Transfer Details (indicate cue type and reason): Pt walked to bathroom and stood to urinate with supervision. Toileting- Clothing Manipulation and Hygiene: Supervision/safety;Sit to/from stand       Functional mobility during ADLs: Minimal assistance;Rolling walker General ADL Comments: Pt seems to be fairly close to baseline with adls. pt did want to use walker today when normally he tends to use a cane.  Pt does have help available at home from son and from [place] that is only a phone call away. Will educate pt on energy conservation techniques.      Vision Vision Assessment?: No apparent visual deficits   Perception Perception Perception Tested?: No   Praxis Praxis Praxis tested?: Within functional limits    Pertinent Vitals/Pain Pain Assessment: No/denies pain     Hand Dominance Right   Extremity/Trunk Assessment Upper Extremity Assessment Upper Extremity Assessment: Overall WFL for tasks assessed   Lower Extremity Assessment Lower Extremity Assessment: Defer to PT evaluation   Cervical / Trunk Assessment Cervical / Trunk Assessment: Normal   Communication Communication Communication: HOH   Cognition Arousal/Alertness: Awake/alert Behavior During Therapy: WFL for tasks assessed/performed Overall Cognitive Status: Within Functional Limits for tasks assessed  General Comments       Exercises       Shoulder Instructions      Home Living Family/patient expects to be discharged to:: Private residence Living Arrangements: Children Available Help at Discharge: Available PRN/intermittently;Other (Comment)  (close to 24 hours a day.  could do 24 hours a day for a few ) Type of Home: House Home Access: Stairs to enter [employer] of Steps: 2 Entrance Stairs-Rails: Right Home Layout: One level     Bathroom Shower/Tub: Teacher, early years/pre: Standard Bathroom Accessibility: Yes How Accessible: Accessible via walker Home Equipment: [place] - 2 wheels;Cane - single point;Bedside commode;Shower seat          Prior Functioning/Environment Level of Independence: Independent with assistive device(s)        Comments: pt states he used cane and was I with ADLs; has meals on wheels; neighbor helps with his son and as much as possible around the house  Pt does not drive    OT Diagnosis: Generalized weakness   OT Problem List: Decreased activity tolerance;Cardiopulmonary status limiting activity;Decreased strength   OT Treatment/Interventions: Self-care/ADL training;Energy conservation;Therapeutic activities    OT Goals(Current goals can be found in the care plan section) Acute Rehab OT Goals Patient Stated Goal: to just feel better OT Goal Formulation: With patient Time For Goal Achievement: 01/11/16 Potential to Achieve Goals: Good ADL Goals Pt Will Perform Grooming: with modified independence;standing Pt Will Perform Tub/Shower Transfer: Shower transfer;rolling walker;ambulating;shower seat;with modified independence Additional ADL Goal #1: pt will gather all clothes and dress with mod I Additional ADL Goal #2: Pt will walk to bathroom with rolling walker and toilet with mod I.  OT Frequency: Min 2X/week   Barriers to D/C:            Co-evaluation PT/OT/SLP Co-Evaluation/Treatment: Yes Reason for Co-Treatment: For patient/therapist safety PT goals addressed during session: Mobility/safety with mobility OT goals addressed during session: ADL's and self-care      End of Session Equipment Utilized During Treatment: Gait belt;Rolling walker;Oxygen Nurse  Communication: Mobility status  Activity Tolerance: Patient tolerated treatment well Patient left: in chair;with call bell/phone within reach;with chair alarm set   Time: [id number] OT Time Calculation (min): 21 min Charges:  OT General Charges $OT Visit: 1 Procedure OT Evaluation $OT Eval Moderate Complexity: 1 Procedure G-Codes:    Glenford Peers 01-24-16, 12:29 PM  [phone]

## 2015-12-28 NOTE — Progress Notes (Signed)
Sean Figueroa   DOB:October 05, 1933   MR#:[id number]    Subjective: He feels well. Continues to have mild dysphagia. Denies any chest pain or abdominal pain. The patient denies any recent signs or symptoms of bleeding such as spontaneous epistaxis, hematuria or hematochezia.   Objective:  Filed Vitals:   12/28/15 1502 12/28/15 1618  BP: 110/53 129/62  Pulse: 82 76  Temp: 98 F (36.7 C)   Resp: 18      Intake/Output Summary (Last 24 hours) at 12/28/15 1623 Last data filed at 12/28/15 0825  Gross per 24 hour  Intake   1440 ml  Output    975 ml  Net    465 ml    GENERAL:alert, no distress and comfortable SKIN: skin color, texture, turgor are normal, no rashes or significant lesions EYES: normal, Conjunctiva are pink and non-injected, sclera clear Musculoskeletal:no cyanosis of digits and no clubbing  NEURO: alert & oriented x 3 with fluent speech, no focal motor/sensory deficits. He is hard of hearing   Labs:  Lab Results  Component Value Date   WBC 8.1 12/27/2015   HGB 11.4* 12/27/2015   HCT 34.5* 12/27/2015   MCV 99.7 12/27/2015   PLT 155 12/27/2015   NEUTROABS 8.4* 12/25/2015    Lab Results  Component Value Date   NA 138 12/28/2015   K 3.4* 12/28/2015   CL 98* 12/28/2015   CO2 34* 12/28/2015    Studies:  Ct Chest W Contrast  12/27/2015  CLINICAL DATA:  Esophageal mass EXAM: CT CHEST, ABDOMEN, AND PELVIS WITH CONTRAST TECHNIQUE: Multidetector CT imaging of the chest, abdomen and pelvis was performed following the standard protocol during bolus administration of intravenous contrast. CONTRAST:  181mL OMNIPAQUE IOHEXOL 300 MG/ML  SOLN COMPARISON:  Esophagram dated 12/25/2015. FINDINGS: CT CHEST FINDINGS Mediastinum/Nodes: Heart is top-normal in size. Thinning at the left ventricular apex, suggesting prior myocardial infarction. Again noted is low density at the left ventricular apex which could reflect thrombus (series 2/ image 45). Three vessel coronary atherosclerosis.  Atherosclerotic calcifications of the aortic arch. Small mediastinal lymph nodes, including a 6 mm subcarinal node, within normal limits. Polypoid mass along the anterior aspect of the distal esophagus just above the GE junction (series 2/ image 42), corresponding to the biopsied lesion on endoscopy. Visualized thyroid is unremarkable. Lungs/Pleura: Chronic lingular opacity/collapse (series 3/ image 35). Small left pleural effusion, mildly increased from 2014, with associated left lower lobe atelectasis. Mild subpleural scarring in the anterior left upper lobe (series 3/ image 21). Additional mild scarring/atelectasis in the medial right middle lobe (series 3/image 46). Underlying mild emphysematous changes. Trace right pleural effusion. No pneumothorax. Musculoskeletal: Degenerative changes of the thoracic spine. CT ABDOMEN PELVIS FINDINGS Hepatobiliary: Unenhanced liver is unremarkable. Status post cholecystectomy. No intrahepatic or extrahepatic ductal dilatation. Pancreas: Pancreatic atrophy. Spleen: Within normal limits. Adrenals/Urinary Tract: 1.6 cm left adrenal adenoma (series 2/image 60), unchanged. 1.3 cm right adrenal adenoma (series 2/ image 69), unchanged. Kidneys are unremarkable. No renal, ureteral, or bladder calculi. Bladder is within normal limits. Stomach/Bowel: Stomach is notable for a small hiatal hernia. No evidence of bowel obstruction. Extensive colonic diverticulosis, without evidence of diverticulitis. Mild concentric wall thickening along the rectum (series 2/ image 108), favored to reflect underdistention. Vascular/Lymphatic: Atherosclerotic calcifications of the abdominal aorta and branch vessels. Fusiform ectasia with saccular outpouching of the infrarenal abdominal aorta, measuring 3.1 x 2.3 cm (series 2/image 72), previously 3.0 x 2.5 cm in 2014. No suspicious abdominopelvic lymphadenopathy, although evaluation is mildly constrained  due to lack of intravenous contrast administration.  Reproductive: Prostate is notable for mild enlargement of the central gland which indents the base of the bladder. Other: No abdominopelvic ascites. Postsurgical changes related to prior left inguinal hernia repair. Musculoskeletal: Degenerative changes of the lumbar spine. IMPRESSION: Evaluation is constrained by lack of intravenous contrast administration. Polypoid mass along the anterior aspect of the distal esophagus above the GE junction. No findings specific for metastatic disease on unenhanced CT. Small left and trace right pleural effusions. Associated left lower lobe atelectasis. Chronic scarring/ atelectasis in the lingula. Bilateral adrenal adenomas. Additional ancillary findings as above. Electronically Signed   By: Julian Hy M.D.   On: 12/27/2015 21:18   Ct Abdomen Pelvis W Contrast  12/27/2015  CLINICAL DATA:  Esophageal mass EXAM: CT CHEST, ABDOMEN, AND PELVIS WITH CONTRAST TECHNIQUE: Multidetector CT imaging of the chest, abdomen and pelvis was performed following the standard protocol during bolus administration of intravenous contrast. CONTRAST:  153mL OMNIPAQUE IOHEXOL 300 MG/ML  SOLN COMPARISON:  Esophagram dated 12/25/2015. FINDINGS: CT CHEST FINDINGS Mediastinum/Nodes: Heart is top-normal in size. Thinning at the left ventricular apex, suggesting prior myocardial infarction. Again noted is low density at the left ventricular apex which could reflect thrombus (series 2/ image 45). Three vessel coronary atherosclerosis. Atherosclerotic calcifications of the aortic arch. Small mediastinal lymph nodes, including a 6 mm subcarinal node, within normal limits. Polypoid mass along the anterior aspect of the distal esophagus just above the GE junction (series 2/ image 42), corresponding to the biopsied lesion on endoscopy. Visualized thyroid is unremarkable. Lungs/Pleura: Chronic lingular opacity/collapse (series 3/ image 35). Small left pleural effusion, mildly increased from 2014, with  associated left lower lobe atelectasis. Mild subpleural scarring in the anterior left upper lobe (series 3/ image 21). Additional mild scarring/atelectasis in the medial right middle lobe (series 3/image 46). Underlying mild emphysematous changes. Trace right pleural effusion. No pneumothorax. Musculoskeletal: Degenerative changes of the thoracic spine. CT ABDOMEN PELVIS FINDINGS Hepatobiliary: Unenhanced liver is unremarkable. Status post cholecystectomy. No intrahepatic or extrahepatic ductal dilatation. Pancreas: Pancreatic atrophy. Spleen: Within normal limits. Adrenals/Urinary Tract: 1.6 cm left adrenal adenoma (series 2/image 60), unchanged. 1.3 cm right adrenal adenoma (series 2/ image 69), unchanged. Kidneys are unremarkable. No renal, ureteral, or bladder calculi. Bladder is within normal limits. Stomach/Bowel: Stomach is notable for a small hiatal hernia. No evidence of bowel obstruction. Extensive colonic diverticulosis, without evidence of diverticulitis. Mild concentric wall thickening along the rectum (series 2/ image 108), favored to reflect underdistention. Vascular/Lymphatic: Atherosclerotic calcifications of the abdominal aorta and branch vessels. Fusiform ectasia with saccular outpouching of the infrarenal abdominal aorta, measuring 3.1 x 2.3 cm (series 2/image 72), previously 3.0 x 2.5 cm in 2014. No suspicious abdominopelvic lymphadenopathy, although evaluation is mildly constrained due to lack of intravenous contrast administration. Reproductive: Prostate is notable for mild enlargement of the central gland which indents the base of the bladder. Other: No abdominopelvic ascites. Postsurgical changes related to prior left inguinal hernia repair. Musculoskeletal: Degenerative changes of the lumbar spine. IMPRESSION: Evaluation is constrained by lack of intravenous contrast administration. Polypoid mass along the anterior aspect of the distal esophagus above the GE junction. No findings specific  for metastatic disease on unenhanced CT. Small left and trace right pleural effusions. Associated left lower lobe atelectasis. Chronic scarring/ atelectasis in the lingula. Bilateral adrenal adenomas. Additional ancillary findings as above. Electronically Signed   By: Julian Hy M.D.   On: 12/27/2015 21:18    Assessment & Plan:  Esophageal adenocarcinoma, clinical [id number] I have a long discussion with the patient, his son and the medical power of attorney, Jenny Reichmann who is the neighbor. Due to his age and comorbidities, the patient is not a candidate for surgery. I have discussed extensively with GI who felt that it represented clinical T2 disease and endoscopic resection is not a good option. I will get his case presented at the next GI tumor board. I have enlisted the help of my GI oncology navigator to arrange for outpatient radiation oncology consultation next week for palliative radiation therapy. The patient is not interested for concurrent chemotherapy and I think this is a reasonable approach given his age and comorbidities.  Weight loss with protein calorie malnutrition secondary to dysphagia He has difficulties with dysphagia causing weight loss Recommend nutritional consult with dietary supplement With palliative radiation therapy, he may respond well to treatment and a feeding tube may be avoided  Esophagitis with GI bleed He is on Protonix  Anemia due to Gi bleed No transfusion is needed for now  CAD on anti-platelet agents Plavix was discontinued Consider stopping aspirin in view of significant esophagitis  Bilateral lower extremity cellulitis He is on IV antibiotic therapy, to be transitioned to oral antibiotics in the future  Discharge planning I have discussed with primary service and GI service. We will get his case presented at the next GI tumor board. The GI oncology navigator will arrange for radiation oncology consultation in the outpatient and I have  enlisted the help of the cancer survivorship navigator to arrange for close outpatient follow-up for supportive management I will sign off. Please call if questions arise  [facility], Carlton Buskey, MD 12/28/2015  4:23 PM

## 2015-12-28 NOTE — Anesthesia Postprocedure Evaluation (Signed)
Anesthesia Post Note  Patient: Sean Figueroa  Procedure(s) Performed: Procedure(s) (LRB): ESOPHAGOGASTRODUODENOSCOPY (EGD) WITH PROPOFOL (N/A)  Patient location during evaluation: Endoscopy Anesthesia Type: MAC Level of consciousness: awake Pain management: pain level controlled Respiratory status: spontaneous breathing Cardiovascular status: stable Anesthetic complications: no    Last Vitals:  Filed Vitals:   12/28/15 0823 12/28/15 1001  BP:  123/53  Pulse: 81 44  Temp:  36.8 C  Resp:  18    Last Pain:  Filed Vitals:   12/28/15 1209  PainSc: 0-No pain                 EDWARDS,Ahriyah Vannest

## 2015-12-29 ENCOUNTER — Telehealth: Payer: Self-pay | Admitting: *Deleted

## 2015-12-29 DIAGNOSIS — C155 Malignant neoplasm of lower third of esophagus: Secondary | ICD-10-CM

## 2015-12-29 DIAGNOSIS — I480 Paroxysmal atrial fibrillation: Secondary | ICD-10-CM

## 2015-12-29 LAB — BASIC METABOLIC PANEL
Anion gap: 9 (ref 5–15)
BUN: 8 mg/dL (ref 6–20)
CHLORIDE: 97 mmol/L — AB (ref 101–111)
CO2: 34 mmol/L — AB (ref 22–32)
CREATININE: 0.81 mg/dL (ref 0.61–1.24)
Calcium: 8.7 mg/dL — ABNORMAL LOW (ref 8.9–10.3)
GFR calc non Af Amer: >60 mL/min (ref >60)
Glucose, Bld: 111 mg/dL — ABNORMAL HIGH (ref 65–99)
POTASSIUM: 3.7 mmol/L (ref 3.5–5.1)
Sodium: 140 mmol/L (ref 135–145)

## 2015-12-29 MED ORDER — PANTOPRAZOLE SODIUM 40 MG PO TBEC
40.0000 mg | DELAYED_RELEASE_TABLET | Freq: Every day | ORAL | Status: DC
  Administered 2015-12-30 – 2016-01-01 (×3): 40 mg via ORAL
  Filled 2015-12-29 (×3): qty 1

## 2015-12-29 NOTE — Progress Notes (Signed)
[facility]                                                                              Patient Demographics  Sean Figueroa, is a 82 y.o. male, DOB - 07-15-1933, [id number]  Admit date - 12/24/2015   Admitting Physician Ripudeep Krystal Eaton, MD  Outpatient Primary MD for the patient is [name], MD  LOS - 5   Chief Complaint  Patient presents with  . Melena  . Emesis  . Neck Pain       Brief HPI   Patient is a 82 year old male with CAD, on dual antiplatelet therapy with aspirin and Plavix, hypertension, hyperlipidemia, GERD, COPD, tobacco abuse Presented to ED with nausea, vomiting, melanotic stools. History was obtained from the patient who reported that he's been having nausea and vomiting for the last 6 days and has been having difficulty keeping anything down. He has no abdominal pain, no hematemesis. He kept trying to keep himself hydrated by drinking a lot of fluids. Since yesterday patient noticed having dark melanotic stools. He takes aspirin and Plavix. He noticed dizziness since yesterday but no chest pain or shortness of breath. He denied any recent endoscopy procedures. In ED patient was noted to have sodium of 133, potassium 3.4, creatinine 1.2, WBCs 12.7, hemoglobin 13.6, FOBT positive. MCV 101.8  No chest x-ray available.    Assessment & Plan   Principal Problem:  Esophageal adenocarcinoma/Nausea, vomiting with GI bleed, dysphagia:  - Esophagogram 2/6 showed irregular mass in the distal esophagus consistent with carcinoma of the esophagus, partially obstructing. - Continue to hold aspirin and Plavix - Continue Protonix  - CT chest, abdomen and pelvis with esophageal mass but no metastasis - Oncology following  Active Problems: Aspiration pneumonia- improving - On 2/7, patient was complaining of productive cough, CXR showed left lung small to moderate pleural effusion with consolidation - Placed on DuoNeb's, IV Zosyn from 2/7   Tobacco  abuse - Patient counseled strongly on tobacco cessation, placed on nicotine patch   Hypokalemia - Replaced  (HCC)/ischemic heart disease, MI, DES in 2013 to circumflex and left main - Hold aspirin, Plavix - Restarted Coreg, Lasix   COPD, severe ([place]) - Patient still continues to smoke, recommended strongly to quit smoking - Placed on O2, DuoNeb   Chronic systolic heart failure ([place]) - 2-D echo 11/15 had shown EF of [id number], grade 1 diastolic dysfunction - Continue to hold aspirin and Plavix for endoscopy - Restart Lasix, Coreg - Hold Avapro, Imdur   GERD (gastroesophageal reflux disease) - Continue PPI  Deconditioning - PTOT evaluation prior to discharge  DVT prophylaxis: SCDs   Code Status: DNR  Family Communication: Discussed in detail with the patient, all imaging results, lab results explained to the patient   Disposition Plan:  Pending palliative team input  Time Spent in minutes   25 minutes  Procedures  CXR esophagogram   Consults   GI /oncology/palliative care  DVT Prophylaxis  SCD's  Medications  Scheduled Meds: . aspirin EC  81 mg Oral Daily  . carvedilol  3.125 mg Oral BID WC  . feeding supplement (ENSURE ENLIVE)  237  mL Oral BID BM  . furosemide  20 mg Oral Daily  . ipratropium-albuterol  3 mL Nebulization QID  . nicotine  21 mg Transdermal Daily  . [START ON 12/30/2015] pantoprazole  40 mg Oral Daily  . piperacillin-tazobactam (ZOSYN)  IV  3.375 g Intravenous Q8H  . pramipexole  0.125 mg Oral QHS   Continuous Infusions:   PRN Meds:.acetaminophen **OR** acetaminophen, albuterol, ALPRAZolam, guaiFENesin-dextromethorphan, HYDROmorphone (DILAUDID) injection, meclizine, ondansetron **OR** ondansetron (ZOFRAN) IV   Antibiotics   Anti-infectives    Start     Dose/Rate Route Frequency Ordered Stop   12/26/15 1200  piperacillin-tazobactam (ZOSYN) IVPB 3.375 g     3.375 g 12.5 mL/hr over 240 Minutes Intravenous Every 8 hours 12/26/15 1024            Subjective:   Hakim Hann was seen and examined today. He denies pain, No nausea, vomiting, or bleeding overnight. He does not like liquid diet , he wants to eat regular diet.  Objective:   Blood pressure 102/51, pulse 72, temperature 98 F (36.7 C), temperature source Oral, resp. rate 18, height 5\' 8"  (1.727 m), weight 70.308 kg (155 lb), SpO2 96 %.  Wt Readings from Last 3 Encounters:  12/27/15 70.308 kg (155 lb)  12/13/15 72.576 kg (160 lb)  10/09/15 72.576 kg (160 lb)     Intake/Output Summary (Last 24 hours) at 12/29/15 2106 Last data filed at 12/29/15 1815  Gross per 24 hour  Intake    750 ml  Output    770 ml  Net    -20 ml    Exam  General: Alert and oriented x 3, NAD, hearing deficit  HEENT:  PERRLA, EOMI  Neck: Supple  CVS: S1 S2 clear, RRR  Respiratory: Decreased breath sounds with mild scattered rhonchi  Abdomen: Soft,NT, NBS  Ext: no cyanosis clubbing or edema  Neuro: no new neuro deficits  Skin: No rashes  Psych: Normal affect and demeanor, alert and oriented x3    Data Review   Micro Results Recent Results (from the past 240 hour(s))  MRSA PCR Screening     Status: None   Collection Time: 12/25/15  8:53 AM  Result Value Ref Range Status   MRSA by PCR NEGATIVE NEGATIVE Final    Comment:        The GeneXpert MRSA Assay (FDA approved for NASAL specimens only), is one component of a comprehensive MRSA colonization surveillance program. It is not intended to diagnose MRSA infection nor to guide or monitor treatment for MRSA infections.     Radiology Reports Ct Chest W Contrast  12/27/2015  CLINICAL DATA:  Esophageal mass EXAM: CT CHEST, ABDOMEN, AND PELVIS WITH CONTRAST TECHNIQUE: Multidetector CT imaging of the chest, abdomen and pelvis was performed following the standard protocol during bolus administration of intravenous contrast. CONTRAST:  142mL OMNIPAQUE IOHEXOL 300 MG/ML  SOLN COMPARISON:  Esophagram dated  12/25/2015. FINDINGS: CT CHEST FINDINGS Mediastinum/Nodes: Heart is top-normal in size. Thinning at the left ventricular apex, suggesting prior myocardial infarction. Again noted is low density at the left ventricular apex which could reflect thrombus (series 2/ image 45). Three vessel coronary atherosclerosis. Atherosclerotic calcifications of the aortic arch. Small mediastinal lymph nodes, including a 6 mm subcarinal node, within normal limits. Polypoid mass along the anterior aspect of the distal esophagus just above the GE junction (series 2/ image 42), corresponding to the biopsied lesion on endoscopy. Visualized thyroid is unremarkable. Lungs/Pleura: Chronic lingular opacity/collapse (series 3/ image 35). Small left pleural  effusion, mildly increased from 2014, with associated left lower lobe atelectasis. Mild subpleural scarring in the anterior left upper lobe (series 3/ image 21). Additional mild scarring/atelectasis in the medial right middle lobe (series 3/image 46). Underlying mild emphysematous changes. Trace right pleural effusion. No pneumothorax. Musculoskeletal: Degenerative changes of the thoracic spine. CT ABDOMEN PELVIS FINDINGS Hepatobiliary: Unenhanced liver is unremarkable. Status post cholecystectomy. No intrahepatic or extrahepatic ductal dilatation. Pancreas: Pancreatic atrophy. Spleen: Within normal limits. Adrenals/Urinary Tract: 1.6 cm left adrenal adenoma (series 2/image 60), unchanged. 1.3 cm right adrenal adenoma (series 2/ image 69), unchanged. Kidneys are unremarkable. No renal, ureteral, or bladder calculi. Bladder is within normal limits. Stomach/Bowel: Stomach is notable for a small hiatal hernia. No evidence of bowel obstruction. Extensive colonic diverticulosis, without evidence of diverticulitis. Mild concentric wall thickening along the rectum (series 2/ image 108), favored to reflect underdistention. Vascular/Lymphatic: Atherosclerotic calcifications of the abdominal aorta  and branch vessels. Fusiform ectasia with saccular outpouching of the infrarenal abdominal aorta, measuring 3.1 x 2.3 cm (series 2/image 72), previously 3.0 x 2.5 cm in 2014. No suspicious abdominopelvic lymphadenopathy, although evaluation is mildly constrained due to lack of intravenous contrast administration. Reproductive: Prostate is notable for mild enlargement of the central gland which indents the base of the bladder. Other: No abdominopelvic ascites. Postsurgical changes related to prior left inguinal hernia repair. Musculoskeletal: Degenerative changes of the lumbar spine. IMPRESSION: Evaluation is constrained by lack of intravenous contrast administration. Polypoid mass along the anterior aspect of the distal esophagus above the GE junction. No findings specific for metastatic disease on unenhanced CT. Small left and trace right pleural effusions. Associated left lower lobe atelectasis. Chronic scarring/ atelectasis in the lingula. Bilateral adrenal adenomas. Additional ancillary findings as above. Electronically Signed   By: Julian Hy M.D.   On: 12/27/2015 21:18   Ct Abdomen Pelvis W Contrast  12/27/2015  CLINICAL DATA:  Esophageal mass EXAM: CT CHEST, ABDOMEN, AND PELVIS WITH CONTRAST TECHNIQUE: Multidetector CT imaging of the chest, abdomen and pelvis was performed following the standard protocol during bolus administration of intravenous contrast. CONTRAST:  143mL OMNIPAQUE IOHEXOL 300 MG/ML  SOLN COMPARISON:  Esophagram dated 12/25/2015. FINDINGS: CT CHEST FINDINGS Mediastinum/Nodes: Heart is top-normal in size. Thinning at the left ventricular apex, suggesting prior myocardial infarction. Again noted is low density at the left ventricular apex which could reflect thrombus (series 2/ image 45). Three vessel coronary atherosclerosis. Atherosclerotic calcifications of the aortic arch. Small mediastinal lymph nodes, including a 6 mm subcarinal node, within normal limits. Polypoid mass along  the anterior aspect of the distal esophagus just above the GE junction (series 2/ image 42), corresponding to the biopsied lesion on endoscopy. Visualized thyroid is unremarkable. Lungs/Pleura: Chronic lingular opacity/collapse (series 3/ image 35). Small left pleural effusion, mildly increased from 2014, with associated left lower lobe atelectasis. Mild subpleural scarring in the anterior left upper lobe (series 3/ image 21). Additional mild scarring/atelectasis in the medial right middle lobe (series 3/image 46). Underlying mild emphysematous changes. Trace right pleural effusion. No pneumothorax. Musculoskeletal: Degenerative changes of the thoracic spine. CT ABDOMEN PELVIS FINDINGS Hepatobiliary: Unenhanced liver is unremarkable. Status post cholecystectomy. No intrahepatic or extrahepatic ductal dilatation. Pancreas: Pancreatic atrophy. Spleen: Within normal limits. Adrenals/Urinary Tract: 1.6 cm left adrenal adenoma (series 2/image 60), unchanged. 1.3 cm right adrenal adenoma (series 2/ image 69), unchanged. Kidneys are unremarkable. No renal, ureteral, or bladder calculi. Bladder is within normal limits. Stomach/Bowel: Stomach is notable for a small hiatal hernia. No evidence of bowel  obstruction. Extensive colonic diverticulosis, without evidence of diverticulitis. Mild concentric wall thickening along the rectum (series 2/ image 108), favored to reflect underdistention. Vascular/Lymphatic: Atherosclerotic calcifications of the abdominal aorta and branch vessels. Fusiform ectasia with saccular outpouching of the infrarenal abdominal aorta, measuring 3.1 x 2.3 cm (series 2/image 72), previously 3.0 x 2.5 cm in 2014. No suspicious abdominopelvic lymphadenopathy, although evaluation is mildly constrained due to lack of intravenous contrast administration. Reproductive: Prostate is notable for mild enlargement of the central gland which indents the base of the bladder. Other: No abdominopelvic ascites.  Postsurgical changes related to prior left inguinal hernia repair. Musculoskeletal: Degenerative changes of the lumbar spine. IMPRESSION: Evaluation is constrained by lack of intravenous contrast administration. Polypoid mass along the anterior aspect of the distal esophagus above the GE junction. No findings specific for metastatic disease on unenhanced CT. Small left and trace right pleural effusions. Associated left lower lobe atelectasis. Chronic scarring/ atelectasis in the lingula. Bilateral adrenal adenomas. Additional ancillary findings as above. Electronically Signed   By: Julian Hy M.D.   On: 12/27/2015 21:18   Dg Esophagus  12/25/2015  CLINICAL DATA:  Dysphagia.  Nausea and vomiting. EXAM: ESOPHOGRAM/BARIUM SWALLOW TECHNIQUE: Single contrast examination was performed using  thin barium. FLUOROSCOPY TIME:  Radiation Exposure Index (as provided by the fluoroscopic device): If the device does not provide the exposure index: Fluoroscopy Time:  1 minutes 54 seconds Number of Acquired Images: COMPARISON:  None. FINDINGS: The patient was very short of breath and had difficulty swallowing barium. There is an irregular narrowing of the distal esophagus. The esophagus is dilated above the narrowing. Barium did pass through the area of narrowing which shows mucosal irregularity and is worrisome for carcinoma of the distal esophagus. This appears be partially obstructing. Limited evaluation of the remainder of the esophagus. Endoscopy recommended. IMPRESSION: Irregular mass distal esophagus consistent with carcinoma of the esophagus. This is partially obstructing. Endoscopy and biopsy recommended. Electronically Signed   By: Franchot Gallo M.D.   On: 12/25/2015 14:54   Dg Chest Port 1 View  12/26/2015  CLINICAL DATA:  82 year old male with shortness of Breath, dysphagia, evidence of esophageal tumor on recent esophagram. Initial encounter. EXAM: PORTABLE CHEST 1 VIEW COMPARISON:  Chest radiographs  09/21/2015.  Esophagram 12/25/2015. FINDINGS: Portable AP semi upright view at 0842 hours. Small a moderate left pleural effusion. Trace right pleural effusion. Mediastinal contours are stable since November. Visualized tracheal air column is within normal limits. No pneumothorax or pulmonary edema. Aside from the dense opacification of the left lung base, no confluent pulmonary opacity. IMPRESSION: Small to moderate left pleural effusion with left lung base collapse or consolidation. Electronically Signed   By: Genevie Ann M.D.   On: 12/26/2015 08:52    CBC  Recent Labs Lab 12/24/15 1540 12/25/15 0425 12/25/15 1419 12/25/15 1954 12/26/15 0348 12/27/15 0300  WBC 12.7* 11.3*  --   --   --  8.1  HGB 13.6 12.5* 12.9* 12.1* 11.9*  12.1* 11.4*  HCT 40.0 37.2* 38.7* 37.3* 36.4*  36.4* 34.5*  PLT 205 179  --   --   --  155  MCV 101.8* 102.5*  --   --   --  99.7  MCH 34.6* 34.4*  --   --   --  32.9  MCHC 34.0 33.6  --   --   --  33.0  RDW 13.7 13.8  --   --   --  13.5  LYMPHSABS  --  1.9  --   --   --   --  MONOABS  --  0.9  --   --   --   --   EOSABS  --  0.2  --   --   --   --   BASOSABS  --  0.0  --   --   --   --     Chemistries   Recent Labs Lab 12/24/15 1540 12/25/15 0624 12/26/15 0348 12/27/15 0300 12/28/15 0540 12/29/15 0558  NA 133* 137 136 140 138 140  K 3.4* 3.2* 3.7 3.6 3.4* 3.7  CL 91* 100* 100* 100* 98* 97*  CO2 29 28 25 29  34* 34*  GLUCOSE 148* 110* 113* 109* 99 111*  BUN 21* 14 7 8 6 8   CREATININE 1.20 0.93 0.84 0.85 0.96 0.81  CALCIUM 8.7* 8.2* 8.6* 8.5* 8.7* 8.7*  AST 16  --   --   --   --   --   ALT 12*  --   --   --   --   --   ALKPHOS 68  --   --   --   --   --   BILITOT 1.0  --   --   --   --   --    ------------------------------------------------------------------------------------------------------------------ estimated creatinine clearance is 70.4 mL/min (by C-G formula based on Cr of  0.81). ------------------------------------------------------------------------------------------------------------------ No results for input(s): HGBA1C in the last 72 hours. ------------------------------------------------------------------------------------------------------------------ No results for input(s): CHOL, HDL, LDLCALC, TRIG, CHOLHDL, LDLDIRECT in the last 72 hours. ------------------------------------------------------------------------------------------------------------------ No results for input(s): TSH, T4TOTAL, T3FREE, THYROIDAB in the last 72 hours.  Invalid input(s): FREET3 ------------------------------------------------------------------------------------------------------------------ No results for input(s): VITAMINB12, FOLATE, FERRITIN, TIBC, IRON, RETICCTPCT in the last 72 hours.  Coagulation profile  Recent Labs Lab 12/24/15 1730  INR 1.06    No results for input(s): DDIMER in the last 72 hours.  Cardiac Enzymes No results for input(s): CKMB, TROPONINI, MYOGLOBIN in the last 168 hours.  Invalid input(s): CK ------------------------------------------------------------------------------------------------------------------ Invalid input(s): POCBNP  No results for input(s): GLUCAP in the last 72 hours.   Tyrika Newman M.D. PhD [facility] 12/29/2015, 9:06 PM  Pager: [phone]  After 7pm go to [email or web address] - password [email or web address]  Call night coverage person covering after 7pm

## 2015-12-29 NOTE — Progress Notes (Signed)
This was a follow up visit to complete advance Directives. Document has been completed but patient wants to take it  to his Doristine Bosworth to review before he proceed to notarize.Marland Kitchen Lonnie the charge nurse witnessed patient making changes. Upon return staff will insure that Mr. Cragle is clear and that his wishes are met.  Will follow as needed.   12/29/15 1300  Clinical Encounter Type  Visited With Patient;Health care provider  Visit Type Follow-up;Spiritual support  Referral From Nurse  Spiritual Encounters  Spiritual Needs Literature  Jaclynn Major, [place]

## 2015-12-29 NOTE — Telephone Encounter (Signed)
Oncology Nurse Navigator Documentation  Oncology Nurse Navigator Flowsheets 12/29/2015  Navigator Location [facility]  Navigator Encounter Type Telephone  Telephone Outgoing Call;Patient Update  Attempted to reach his HPOA, Lane Hacker to inform him of the referral to radiation oncology. No answer-left VM to return call to GI Navigator at direct #. Referral for radiation oncology placed.

## 2015-12-29 NOTE — Evaluation (Signed)
Clinical/Bedside Swallow Evaluation Patient Details  Name: Sean Figueroa MRN: [id number] Date of Birth: 1933/06/14  Today's Date: 12/29/2015 Time: SLP Start Time (ACUTE ONLY): [id number] SLP Stop Time (ACUTE ONLY): 1545 SLP Time Calculation (min) (ACUTE ONLY): 15 min  Past Medical History:  Past Medical History  Diagnosis Date  . 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 Has chronic angina; S/P DES to the LCX and DES to the L MAIN 12/2011. Now on Plavix and ASA.   Marland Kitchen Hyperlipidemia   . Hypertension   . Long-term (current) use of anticoagulants     For hx of LV apical aneurysm  . LVH (left ventricular hypertrophy)   . Atrial fibrillation ([place])     Unclear history  . Tobacco abuse   . Systolic heart failure     EF is 35% per cath 12/2011  . Myocardial infarction [facility] 1981    [name] 01/14/2012  . 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 On home oxygen therapy     "2.5L 24/7" (10/06/2014)  . COPD (chronic obstructive pulmonary disease) ([place])   . Pneumonia "several times"  . Arthritis     "shoulders; back" (10/06/2014)  . History of gout   . Chronic back pain     "anytime it's wet and cold" (10/06/2014)  . Nocturia   . Syncopal episodes    Past Surgical History:  Past Surgical History  Procedure Laterality Date  . Cholecystectomy    . Hernia repair    . Coronary angioplasty with stent placement  12/2011    LCX and L main - drug eluting  . Cardiac catheterization  1981    [name] 01/14/2012  . Left heart catheterization with coronary angiogram N/A 01/16/2012    Procedure: LEFT HEART CATHETERIZATION WITH CORONARY ANGIOGRAM;  Surgeon: Sherren Mocha, MD;  Location[facility] CATH LAB;  Service: Cardiovascular;  Laterality: N/A;  . Percutaneous coronary stent intervention (pci-s) N/A 01/20/2012    Procedure: PERCUTANEOUS CORONARY STENT INTERVENTION (PCI-S);  Surgeon:  Sherren Mocha, MD;  Location: [facility] CATH LAB;  Service: Cardiovascular;  Laterality: N/A;  . Esophagogastroduodenoscopy (egd) with propofol N/A 12/27/2015    Procedure: ESOPHAGOGASTRODUODENOSCOPY (EGD) WITH PROPOFOL;  Surgeon: Mauri Pole, MD;  Location: [place] ENDOSCOPY;  Service: Endoscopy;  Laterality: N/A;   HPI:  Patient is a 82 year old male with CAD, on dual antiplatelet therapy with aspirin and Plavix, hypertension, hyperlipidemia, GERD, COPD, tobacco abuse Presented to ED with nausea, vomiting, melanotic stools.  Esophagram revealed distal esophageal mass arising from GE junction consistent with adenocarcinoma   Assessment / Plan / Recommendation Clinical Impression  Patient presents with a functional oropharyngeal swallow and appears with primary esophageal dysphagia. Patient reported (and exhibited ) belching after intake of PO's (puree solids, regular solids) which he stated he has had for some time now. Patient also has a tumor in his distal esophagus which is obstructing passage of material/food through it. GI MD is following patient and recommending very soft solids with nothing larger than the diameter of approximately a dime.    No benefit from SLP intervention at this time as patient will be followed by GI.    Aspiration Risk  Mild aspiration risk    Diet Recommendation Thin liquid;Dysphagia 3 (Mech soft), although GI MD is recommending Dys 1, thin at this time due to his esophageal tumor.  Liquid Administration via:  Cup;Straw Medication Administration: Whole meds with liquid Supervision: Patient able to self feed Postural Changes: Seated upright at 90 degrees;Remain upright for at least 30 minutes after po intake    Other  Recommendations Oral Care Recommendations: Oral care BID   Follow up Recommendations  None    Frequency and Duration   N/A         Prognosis Prognosis for Safe Diet Advancement: Guarded (patient's dysphagia is primarily esophageal and with  significant impact from mass in lower esophagus that is obstructing passage of food/liquids)      Swallow Study   General HPI: Patient is a 82 year old male with CAD, on dual antiplatelet therapy with aspirin and Plavix, hypertension, hyperlipidemia, GERD, COPD, tobacco abuse Presented to ED with nausea, vomiting, melanotic stools.  Esophagram revealed distal esophageal mass arising from GE junction consistent with adenocarcinoma    Oral/Motor/Sensory Function Overall Oral Motor/Sensory Function: Within functional limits   Ice Chips     Thin Liquid Thin Liquid: Within functional limits Presentation: Cup;Straw;Self Fed Other Comments: No overt s/s of aspiration with thin liquids    Nectar Thick     Honey Thick     Puree Puree: Within functional limits Presentation: Self Fed;Spoon Other Comments: No overt s/s of aspiration and patient denied any discomfort during swallow   Solid   GO   Solid: Within functional limits Presentation: Self Fed Other Comments: Patient able to fully masticate graham cracker, despite being edentulous        Sean Figueroa 12/29/2015,4:10 PM  Sonia Baller, MA, CCC-SLP 12/29/2015 4:11 PM

## 2015-12-29 NOTE — Progress Notes (Signed)
Have read Dr Calton Dach 2/9 note. Dr Eugenia Pancoast available for outpt EUS if GI tumor board decides EUS is needed.  No plans for gastric feeding tube at present.  Keep pt on once daily PPI.   ? If Plavix (afib is indication, 2013 DES ) is too risky going forward, given risk of GI bleeding from tumor.  Restarting 81 mg ASA ok from GI perspective.  Stick to full liquid diet, Ensure Enlive BID per RD rec of 2/7.   GI signing off.   Call if questions or reinvolvement needed.   Azucena Freed, PA-C  [phone] pager.    Attending physician's note   I have taken an interval history, reviewed the chart and examined the patient. I agree with the Advanced Practitioner's note, impression and recommendations.   Damaris Hippo, MD [phone] Mon-Fri 8a-5p [phone] after 5p, weekends, holidays  Discussed with patient and his family at length the various options. He would consider palliative chemotherapy and radiation if he would not experience severe side effects. He has follow-up appointment with cancer Center, based on the recommendation of the GI tumor board, will decide if he would benefit with staging by EUS or not.  Please call with any questions  K. Denzil Magnuson , MD [phone] Mon-Fri 8a-5p [phone] after 5p, weekends, holidays

## 2015-12-29 NOTE — Evaluation (Signed)
Occupational Therapy Evaluation Patient Details Name: Sean Figueroa MRN: [id number] DOB: 03/02/1933 Today's Date: 12/29/2015    History of Present Illness Sean Figueroa is a 82 y.o. male with a Past Medical History of CAD, HLD, HTN, A. fib, systolic CHF, MI, COPD oxygen dependent who presents with hypercapnic respiratory failure secondary to COPD exacerbation.   Clinical Impression   Pt performed sponge bathing, dressing and grooming seated at sink with set up to supervision. Educated pt in energy conservation strategies. Pt reporting feeling much better after shaving with electric razor brought to him by a friend.    Follow Up Recommendations  Home health OT    Equipment Recommendations  None recommended by OT    Recommendations for Other Services       Precautions / Restrictions Precautions Precautions: Fall Restrictions Other Position/Activity Restrictions: O2      Mobility Bed Mobility Overal bed mobility: Independent                Transfers Overall transfer level: Needs assistance Equipment used: Rolling walker (2 wheeled) Transfers: Sit to/from Stand Sit to Stand: Supervision         General transfer comment: assist for lines    Balance                                            ADL Overall ADL's : Needs assistance/impaired     Grooming: Wash/dry hands;Wash/dry face;Sitting;Set up (shaving) Grooming Details (indicate cue type and reason): at sink in sitting Upper Body Bathing: Set up;Sitting   Lower Body Bathing: Supervison/ safety;Sit to/from stand   Upper Body Dressing : Set up;Sitting   Lower Body Dressing: Supervision/safety;Sit to/from stand               Functional mobility during ADLs: Supervision/safety;Rolling walker General ADL Comments: educated in energy conservation strategies and breathing techniques     Vision     Perception     Praxis      Pertinent Vitals/Pain Pain Assessment: No/denies  pain     Hand Dominance     Extremity/Trunk Assessment             Communication     Cognition Arousal/Alertness: Awake/alert Behavior During Therapy: WFL for tasks assessed/performed Overall Cognitive Status: Within Functional Limits for tasks assessed                     General Comments       Exercises       Shoulder Instructions      Home Living                                          Prior Functioning/Environment               OT Diagnosis:     OT Problem List:     OT Treatment/Interventions:      OT Goals(Current goals can be found in the care plan section) Acute Rehab OT Goals Patient Stated Goal: to just feel better  OT Frequency: Min 2X/week   Barriers to D/C:            Co-evaluation              End of Session Equipment Utilized During  Treatment: Gait belt;Rolling walker;Oxygen Nurse Communication:  (NT aware that pt has bathed and groomed, up in chair)  Activity Tolerance: Patient tolerated treatment well Patient left: in chair;with call bell/phone within reach;with family/visitor present (ST in room)   Time: [id number] OT Time Calculation (min): 41 min Charges:  OT General Charges $OT Visit: 1 Procedure OT Treatments $Self Care/Home Management : 38-52 mins G-Codes:    Malka So 12/29/2015, 3:45 PM  [phone]

## 2015-12-29 NOTE — Addendum Note (Signed)
Addendum  created 12/29/15 1935 by Finis Bud, MD   Modules edited: Clinical Notes   Clinical Notes:  File: [id number]

## 2015-12-29 NOTE — Progress Notes (Signed)
Patient refuses bed alarm.  He was educated on safety and the importance of calling before getting out of bed. Further teaching will be given, and alarm will be set when he agrees.

## 2015-12-29 NOTE — Anesthesia Postprocedure Evaluation (Signed)
Anesthesia Post Note  Patient: Sean Figueroa  Procedure(s) Performed: Procedure(s) (LRB): ESOPHAGOGASTRODUODENOSCOPY (EGD) WITH PROPOFOL (N/A)  Patient location during evaluation: Endoscopy Anesthesia Type: MAC Level of consciousness: awake Pain management: pain level controlled Vital Signs Assessment: post-procedure vital signs reviewed and stable Respiratory status: spontaneous breathing Cardiovascular status: stable Anesthetic complications: no    Last Vitals:  Filed Vitals:   12/29/15 1445 12/29/15 1640  BP: 114/58 102/51  Pulse: 76 72  Temp: 36.7 C   Resp: 18     Last Pain:  Filed Vitals:   12/29/15 1640  PainSc: 0-No pain                 EDWARDS,Bart Ashford

## 2015-12-29 NOTE — Progress Notes (Signed)
Physical Therapy Treatment Patient Details Name: Sean Figueroa MRN: [id number] DOB: 1933-06-24 Today's Date: 12/29/2015    History of Present Illness Sean Figueroa is a 82 y.o. male with a Past Medical History of CAD, HLD, HTN, A. fib, systolic CHF, MI, COPD oxygen dependent who presents with hypercapnic respiratory failure secondary to COPD exacerbation.    PT Comments    Pt performed increased gait distance with improved O2 saturations with activity.  Pt fatigued with noted DOE but vitals remain stable.  Pt reports weakness in B LEs but denies pain.  Will attempt stair training next session.    Follow Up Recommendations  Home health PT;Supervision/Assistance - 24 hour     Equipment Recommendations  None recommended by PT    Recommendations for Other Services       Precautions / Restrictions Precautions Precautions: Fall Restrictions Weight Bearing Restrictions: No Other Position/Activity Restrictions: O2    Mobility  Bed Mobility               General bed mobility comments: Pt was on EOB on arrival.  Transfers Overall transfer level: Needs assistance Equipment used: Rolling walker (2 wheeled) Transfers: Sit to/from Stand Sit to Stand: Supervision         General transfer comment: Pt generally safe with transfers.  Cues given for hand placement when using walker.  Ambulation/Gait Ambulation/Gait assistance: Min guard Ambulation Distance (Feet): 180 Feet Assistive device: Rolling walker (2 wheeled) Gait Pattern/deviations: Step-through pattern;Narrow base of support;Decreased stride length Gait velocity: decreased   General Gait Details: Pt performed gait training with cues for pacing and RW safety.  O2 sats 93%-95% on 3L. Pt required cues for backing and turning to maintain safety.     Stairs            Wheelchair Mobility    Modified Rankin (Stroke Patients Only)       Balance     Sitting balance-Leahy Scale: Good       Standing  balance-Leahy Scale: Good                      Cognition Arousal/Alertness: Awake/alert Behavior During Therapy: WFL for tasks assessed/performed Overall Cognitive Status: Within Functional Limits for tasks assessed                      Exercises      General Comments        Pertinent Vitals/Pain Pain Assessment: No/denies pain    Home Living                      Prior Function            PT Goals (current goals can now be found in the care plan section) Acute Rehab PT Goals Patient Stated Goal: to just feel better Potential to Achieve Goals: Good Progress towards PT goals: Progressing toward goals    Frequency  Min 3X/week    PT Plan      Co-evaluation             End of Session Equipment Utilized During Treatment: Gait belt;Oxygen (IV pole) Activity Tolerance: Patient tolerated treatment well Patient left: in bed;with call bell/phone within reach (sitting edge of bed.)     Time: [id number] PT Time Calculation (min) (ACUTE ONLY): 20 min  Charges:  $Gait Training: 8-22 mins  G Codes:      Cristela Blue 12/29/2015, 10:37 AM Governor Rooks, PTA pager [phone]

## 2015-12-30 DIAGNOSIS — Z515 Encounter for palliative care: Secondary | ICD-10-CM

## 2015-12-30 LAB — BASIC METABOLIC PANEL
Anion gap: 9 (ref 5–15)
BUN: 7 mg/dL (ref 6–20)
CHLORIDE: 94 mmol/L — AB (ref 101–111)
CO2: 34 mmol/L — AB (ref 22–32)
CREATININE: 0.97 mg/dL (ref 0.61–1.24)
Calcium: 8.8 mg/dL — ABNORMAL LOW (ref 8.9–10.3)
GFR calc Af Amer: >60 mL/min (ref >60)
GFR calc non Af Amer: >60 mL/min (ref >60)
Glucose, Bld: 177 mg/dL — ABNORMAL HIGH (ref 65–99)
Potassium: 3.7 mmol/L (ref 3.5–5.1)
Sodium: 137 mmol/L (ref 135–145)

## 2015-12-30 MED ORDER — ENSURE ENLIVE PO LIQD: 237.0000 mL | Freq: Two times a day (BID) | ORAL | Status: AC

## 2015-12-30 NOTE — Consult Note (Signed)
Consultation Note Date: 12/30/2015   Patient Name: Sean Figueroa  DOB: October 01, 1933  MRN: [id number]  Age / Sex: 82 y.o., male  PCP: [name], MD Referring Physician: Florencia Reasons, MD  Reason for Consultation: Establishing goals of care and Psychosocial/spiritual support   Clinical Assessment/Narrative:  Patient is a 82 year old male with CAD, on dual antiplatelet therapy with aspirin and Plavix, hypertension, hyperlipidemia, GERD, COPD, tobacco abuse Presented to ED with nausea, vomiting, melanotic stools.  Admitted for further workup and stabilization. In ED patient was noted to have sodium of 133, potassium 3.4, creatinine 1.2, WBCs 12.7, hemoglobin 13.6, FOBT positive. MCV 101.8    Seen by Oncology/ Dr Alvy Bimler (( Esophageal adenocarcinoma, clinical T2N0M0 )  Patient is not interested in systemic treatment but open to radiation therapy.  This NP Wadie Lessen reviewed medical records, received report from team, assessed the patient and then meet at the patient's bedside along with his HPOA/ Janese Banks and patient's son Deveron Furlong (special needs)  to discuss diagnosis, prognosis, GOC, EOL wishes disposition and options.   A detailed discussion was had today regarding advanced directives.  Concepts specific to code status, artifical feeding and hydration, continued IV antibiotics and rehospitalization was had.  The difference between a aggressive medical intervention path  and a palliative comfort care path for this patient at this time was had.  Values and goals of care important to patient and family were attempted to be elicited.  Concept of Hospice and Palliative Care were discussed  Natural trajectory and expectations at EOL were discussed.  Questions and concerns addressed.  Family encouraged to call with questions or concerns.  PMT will continue to support holistically.   Primary Decision Maker: Patient and his HPOA  is Friend Solicitor    HCPOA: no -waiting for notary, spiritual care notified   SUMMARY OF RECOMMENDATIONS  -comfort feeds as tolerated, no artificial feeding now or in the future -no systemic chemotherapy, but open to palliative radiation -home with hospice services on discharge, if eligible, will write for choice  Code Status/Advance Care Planning: DNR    Code Status Orders        Start     Ordered   12/29/15 1255  Do not attempt resuscitation (DNR)   Continuous    Question Answer Comment  In the event of cardiac or respiratory ARREST Do not call a "code blue"   In the event of cardiac or respiratory ARREST Do not perform Intubation, CPR, defibrillation or ACLS   In the event of cardiac or respiratory ARREST Use medication by any route, position, wound care, and other measures to relive pain and suffering. May use oxygen, suction and manual treatment of airway obstruction as needed for comfort.      12/29/15 1254    Code Status History    Date Active Date Inactive Code Status Order ID Comments User Context   12/25/2015  5:41 AM 12/29/2015 12:54 PM Full Code [id number]  Mendel Corning, MD ED   09/21/2015  3:39 PM 09/24/2015  6:11 PM Full Code [id number]  Radene Gunning, NP Inpatient   10/06/2014  5:43 PM 10/08/2014  7:02 PM Full Code [id number]  Almyra Deforest, PA Inpatient    Advance Directive Documentation        Most Recent Value   Type of Advance Directive  Healthcare Power of Sherald Hess believes he has a health care POA]   Pre-existing out of facility DNR order (yellow form or pink MOST form)     "  MOST" Form in Place?        Other Directives:None--hopeful for Spiritual care to assist with AD/HPOA   Palliative Prophylaxis:   Aspiration, Bowel Regimen, Delirium Protocol, Frequent Pain Assessment and Oral Care  Additional Recommendations (Limitations, Scope, Preferences):  Avoid Hospitalization, Initiate Comfort Feeding, No Artificial Feeding and No  Chemotherapy   Psycho-social/Spiritual:  Support System: Fair- his neighbor Jenny Reichmann is his main support person Desire for further Chaplaincy support:yes Additional Recommendations: Education on Hospice  Prognosis: Less than 6 months Discharge Planning: Home with Hospice   Chief Complaint/ Primary Diagnoses: Present on Admission:  . GI bleed . Tobacco abuse . PAF (paroxysmal atrial fibrillation) ([place]) . Esophageal reflux . COPD, severe ([place]) . Chronic systolic heart failure ([place]) . GERD (gastroesophageal reflux disease) . Nausea & vomiting . Hypokalemia . Leukocytosis  I have reviewed the medical record, interviewed the patient and family, and examined the patient. The following aspects are pertinent.  Past Medical History  Diagnosis Date  . 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 Has chronic angina; S/P DES to the LCX and DES to the L MAIN 12/2011. Now on Plavix and ASA.   Marland Kitchen Hyperlipidemia   . Hypertension   . Long-term (current) use of anticoagulants     For hx of LV apical aneurysm  . LVH (left ventricular hypertrophy)   . Atrial fibrillation ([place])     Unclear history  . Tobacco abuse   . Systolic heart failure     EF is 35% per cath 12/2011  . Myocardial infarction [facility]) 1981    [name] 01/14/2012  . Left ventricular apical thrombus ([place])     hx/notes 10/06/2014  . On home oxygen therapy     "2.5L 24/7" (10/06/2014)  . COPD (chronic obstructive pulmonary disease) ([place])   . Pneumonia "several times"  . Arthritis     "shoulders; back" (10/06/2014)  . History of gout   . Chronic back pain     "anytime it's wet and cold" (10/06/2014)  . Nocturia   . Syncopal episodes    Social History   Social History  . Marital Status: Widowed    Spouse Name: N/A  . Number of Children: N/A  . Years of Education: N/A   Social History Main Topics  . Smoking status:  Current Every Day Smoker -- 0.75 packs/day for 65 years    Types: Cigarettes  . Smokeless tobacco: Never Used  . Alcohol Use: Yes     Comment: "quit drinking in the 1990's"  . Drug Use: No  . Sexual Activity: Not Currently   Other Topics Concern  . None   Social History Narrative   Family History  Problem Relation Age of Onset  . Heart disease Mother   . Cerebral palsy Daughter   . Hypertension Mother    Scheduled Meds: . aspirin EC  81 mg Oral Daily  . carvedilol  3.125 mg Oral BID WC  . feeding supplement (ENSURE ENLIVE)  237 mL Oral BID BM  . furosemide  20 mg Oral Daily  . ipratropium-albuterol  3 mL Nebulization QID  . nicotine  21 mg Transdermal Daily  . pantoprazole  40 mg Oral Daily  . piperacillin-tazobactam (ZOSYN)  IV  3.375 g Intravenous Q8H  . pramipexole  0.125 mg Oral QHS   Continuous Infusions:  PRN Meds:.acetaminophen **OR** acetaminophen, albuterol, ALPRAZolam, guaiFENesin-dextromethorphan, HYDROmorphone (DILAUDID) injection,  meclizine, ondansetron **OR** ondansetron (ZOFRAN) IV Medications Prior to Admission:  Prior to Admission medications   Medication Sig Start Date End Date Taking? Authorizing Provider  allopurinol (ZYLOPRIM) 100 MG tablet Take 100 mg by mouth daily.  03/23/12  Yes Historical Provider, MD  ALPRAZolam Duanne Moron) 0.25 MG tablet TAKE 1 TABLET BY MOUTH TWICE A DAY AS NEEDED FOR ANXIETY 07/20/15  Yes Darlin Coco, MD  aspirin EC 81 MG tablet Take 81 mg by mouth daily.   Yes Historical Provider, MD  CALCIUM PO Take 1 tablet by mouth daily.   Yes Historical Provider, MD  clopidogrel (PLAVIX) 75 MG tablet TAKE 1 TABLET (75 MG TOTAL) BY MOUTH DAILY. WITH BREAKFAST 11/06/15  Yes Darlin Coco, MD  [employer] D3 1000 UNITS capsule Take 1,000 Units by mouth daily. 09/11/14  Yes Historical Provider, MD  fish oil-omega-3 fatty acids 1000 MG capsule Take 1 g by mouth 2 (two) times daily.    Yes Historical Provider, MD  furosemide (LASIX) 40 MG tablet Take 1  tablet (40 mg total) by mouth 2 (two) times daily. 09/24/15  Yes Barton Dubois, MD  guaiFENesin (MUCINEX) 600 MG 12 hr tablet Take 1,200 mg by mouth 2 (two) times daily as needed for cough or to loosen phlegm.    Yes Historical Provider, MD  ipratropium (ATROVENT) 0.02 % nebulizer solution Take 0.5 mg by nebulization 4 (four) times daily.   Yes Historical Provider, MD  irbesartan (AVAPRO) 75 MG tablet TAKE 1 TABLET BY MOUTH EVERY DAY 12/22/15  Yes Darlin Coco, MD  isosorbide mononitrate (IMDUR) 60 MG 24 hr tablet TAKE 1 TABLET (60 MG TOTAL) BY MOUTH 2 (TWO) TIMES DAILY. 10/11/15  Yes Darlin Coco, MD  KLOR-CON M20 20 MEQ tablet TAKE 1 TABLET BY MOUTH TWICE A DAY 09/18/15  Yes Darlin Coco, MD  meclizine (ANTIVERT) 25 MG tablet Take 1 tablet (25 mg total) by mouth 3 (three) times daily as needed for dizziness. Additional refills from PCP 09/05/15  Yes Darlin Coco, MD  metoprolol (LOPRESSOR) 25 MG tablet Take 1 tablet (25 mg total) by mouth 2 (two) times daily. 07/26/15  Yes Darlin Coco, MD  NITROSTAT 0.4 MG SL tablet PLACE 1 TABLET (0.4 MG TOTAL) UNDER THE TONGUE EVERY 5 (FIVE) MINUTES AS NEEDED. Patient taking differently: PLACE 1 TABLET (0.4 MG TOTAL) UNDER THE TONGUE EVERY 5 (FIVE) MINUTES AS NEEDED FOR CHEST PAIN 04/28/15  Yes Darlin Coco, MD  OXYGEN Inhale into the lungs continuous.   Yes Historical Provider, MD  pantoprazole (PROTONIX) 40 MG tablet TAKE 1 TABLET BY MOUTH DAILY 12/12/15  Yes Darlin Coco, MD  pramipexole (MIRAPEX) 0.125 MG tablet Take 0.125 mg by mouth at bedtime. 12/12/15  Yes Historical Provider, MD  PROAIR HFA 108 (90 BASE) MCG/ACT inhaler INHALE 2 PUFFS INTO LUNGS EVERY 6 HOURS AS NEEDED FOR WHEEZING 07/18/13  Yes Darlin Coco, MD  ranitidine (ZANTAC) 150 MG capsule Take 150 mg by mouth at bedtime. 12/06/15  Yes Historical Provider, MD  rosuvastatin (CRESTOR) 10 MG tablet TAKE 1 TABLET (10 MG TOTAL) BY MOUTH DAILY. 12/12/15  Yes Darlin Coco, MD   temazepam (RESTORIL) 30 MG capsule TAKE ONE CAPSULE BY MOUTH AT BEDTIME AS NEEDED Patient taking differently: TAKE ONE CAPSULE BY MOUTH AT BEDTIME AS NEEDED FOR SLEEP 06/14/15  Yes Darlin Coco, MD  traMADol (ULTRAM) 50 MG tablet Take 50 mg by mouth every 6 (six) hours as needed for moderate pain.  06/13/14  Yes Historical Provider, MD   Allergies  Allergen  Reactions  . Indomethacin Other (See Comments)    Unknown reaction  . Lipitor [Atorvastatin Calcium] Other (See Comments)    Leg pain  . Pravachol Other (See Comments)    Leg pain  . Procardia [Nifedipine] Other (See Comments)    Reaction unknown  . Zaroxolyn [Metolazone] Other (See Comments)    Reaction unknown    Review of Systems  Constitutional: Positive for fever, activity change and appetite change.  Respiratory: Positive for cough.     Physical Exam  Constitutional: He is cooperative. He appears ill.  Cardiovascular: Normal rate, regular rhythm and normal heart sounds.   Respiratory: He has decreased breath sounds in the right lower field and the left lower field.  Neurological: He is alert.  Skin: Skin is warm and dry.    Vital Signs: BP 125/66 mmHg  Pulse 76  Temp(Src) 98 F (36.7 C) (Oral)  Resp 18  Ht 5\' 8"  (1.727 m)  Wt 70.308 kg (155 lb)  BMI 23.57 kg/m2  SpO2 93%  SpO2: SpO2: 93 % O2 Device:SpO2: 93 % O2 Flow Rate: .O2 Flow Rate (L/min): 3 L/min  IO: Intake/output summary:  Intake/Output Summary (Last 24 hours) at 12/30/15 0848 Last data filed at 12/30/15 0820  Gross per 24 hour  Intake    650 ml  Output   1075 ml  Net   -425 ml    LBM: Last BM Date: 12/25/15 Baseline Weight: Weight: 68.675 kg (151 lb 6.4 oz) Most recent weight: Weight: 70.308 kg (155 lb)      Palliative Assessment/Data:  Flowsheet Rows        Most Recent Value   Intake Tab    Referral Department  Gastroenterology   Unit at Time of Referral  Med/Surg Unit   Palliative Care Primary Diagnosis  Other (Comment) [GI]    Date Notified  12/28/15   Palliative Care Type  New Palliative care   Reason for referral  Clarify Goals of Care   Date of Admission  12/24/15   # of days IP prior to Palliative referral  4   Clinical Assessment    Psychosocial & Spiritual Assessment    Palliative Care Outcomes       Additional Data Reviewed:  CBC:    Component Value Date/Time   WBC 8.1 12/27/2015 0300   HGB 11.4* 12/27/2015 0300   HCT 34.5* 12/27/2015 0300   PLT 155 12/27/2015 0300   MCV 99.7 12/27/2015 0300   NEUTROABS 8.4* 12/25/2015 0425   LYMPHSABS 1.9 12/25/2015 0425   MONOABS 0.9 12/25/2015 0425   EOSABS 0.2 12/25/2015 0425   BASOSABS 0.0 12/25/2015 0425   Comprehensive Metabolic Panel:    Component Value Date/Time   NA 140 12/29/2015 0558   K 3.7 12/29/2015 0558   CL 97* 12/29/2015 0558   CO2 34* 12/29/2015 0558   BUN 8 12/29/2015 0558   CREATININE 0.81 12/29/2015 0558   GLUCOSE 111* 12/29/2015 0558   CALCIUM 8.7* 12/29/2015 0558   AST 16 12/24/2015 1540   ALT 12* 12/24/2015 1540   ALKPHOS 68 12/24/2015 1540   BILITOT 1.0 12/24/2015 1540   PROT 6.0* 12/24/2015 1540   ALBUMIN 3.1* 12/24/2015 1540   Discussed with Dr Erlinda Hong  Time In: 1600 Time Out: 1715 Time Total: 75 min Greater than 50%  of this time was spent counseling and coordinating care related to the above assessment and plan.  Signed by: Wadie Lessen, NP  Knox Royalty, NP  12/30/2015, 8:48 AM  Please  contact Palliative Medicine Team phone at [phone] for questions and concerns.

## 2015-12-30 NOTE — Consult Note (Signed)
[place] paged by ML of palliative care to complete Adv. Dir.; Informed of status and recommended outside notary to sign as pt scheduled for weekend d/x

## 2015-12-30 NOTE — Progress Notes (Signed)
[facility]                                                                              Patient Demographics  Sean Figueroa, is a 82 y.o. male, DOB - [birth year], [id number]  Admit date - 12/24/2015   Admitting Physician Ripudeep Krystal Eaton, MD  Outpatient Primary MD for the patient is [name], MD  LOS - 6   Chief Complaint  Patient presents with  . Melena  . Emesis  . Neck Pain       Brief HPI   Patient is a 82 year old male with CAD, on dual antiplatelet therapy with aspirin and Plavix, hypertension, hyperlipidemia, GERD, COPD, tobacco abuse Presented to ED with nausea, vomiting, melanotic stools. History was obtained from the patient who reported that he's been having nausea and vomiting for the last 6 days and has been having difficulty keeping anything down. He has no abdominal pain, no hematemesis. He kept trying to keep himself hydrated by drinking a lot of fluids. Since yesterday patient noticed having dark melanotic stools. He takes aspirin and Plavix. He noticed dizziness since yesterday but no chest pain or shortness of breath. He denied any recent endoscopy procedures. In ED patient was noted to have sodium of 133, potassium 3.4, creatinine 1.2, WBCs 12.7, hemoglobin 13.6, FOBT positive. MCV 101.8  No chest x-ray available.    Assessment & Plan   Principal Problem:  Esophageal adenocarcinoma/Nausea, vomiting with GI bleed, dysphagia:  - Esophagogram 2/6 showed irregular mass in the distal esophagus consistent with carcinoma of the esophagus, partially obstructing. - Continue to hold aspirin and Plavix - Continue Protonix  - CT chest, abdomen and pelvis with esophageal mass but no metastasis - Oncology following  Active Problems: Aspiration pneumonia- improving - On 2/7, patient was complaining of productive cough, CXR showed left lung small to moderate pleural effusion with consolidation - Placed on DuoNeb's, IV Zosyn from 2/7   Tobacco  abuse - Patient counseled strongly on tobacco cessation, placed on nicotine patch   Hypokalemia - Replaced  (HCC)/ischemic heart disease, MI, DES in 2013 to circumflex and left main - Hold aspirin, Plavix - Restarted Coreg, Lasix   COPD, severe ([place]) - Patient still continues to smoke, recommended strongly to quit smoking - Placed on O2, DuoNeb   Chronic systolic heart failure ([place]) - 2-D echo 11/15 had shown EF of [id number], grade 1 diastolic dysfunction - Continue to hold aspirin and Plavix for endoscopy - Restart Lasix, Coreg - Hold Avapro, Imdur   GERD (gastroesophageal reflux disease) - Continue PPI  Deconditioning - PTOT evaluation prior to discharge  DVT prophylaxis: SCDs   Code Status: DNR  Family Communication: Discussed in detail with the patient, all imaging results, lab results explained to the patient   Disposition Plan:  Pending palliative team input  Time Spent in minutes   15 minutes  Procedures  CXR esophagogram   Consults   GI /oncology/palliative care  DVT Prophylaxis  SCD's  Medications  Scheduled Meds: . aspirin EC  81 mg Oral Daily  . carvedilol  3.125 mg Oral BID WC  . feeding supplement (ENSURE ENLIVE)  237  mL Oral BID BM  . furosemide  20 mg Oral Daily  . ipratropium-albuterol  3 mL Nebulization QID  . nicotine  21 mg Transdermal Daily  . pantoprazole  40 mg Oral Daily  . piperacillin-tazobactam (ZOSYN)  IV  3.375 g Intravenous Q8H  . pramipexole  0.125 mg Oral QHS   Continuous Infusions:   PRN Meds:.acetaminophen **OR** acetaminophen, albuterol, ALPRAZolam, guaiFENesin-dextromethorphan, HYDROmorphone (DILAUDID) injection, meclizine, ondansetron **OR** ondansetron (ZOFRAN) IV   Antibiotics   Anti-infectives    Start     Dose/Rate Route Frequency Ordered Stop   12/26/15 1200  piperacillin-tazobactam (ZOSYN) IVPB 3.375 g     3.375 g 12.5 mL/hr over 240 Minutes Intravenous Every 8 hours 12/26/15 1024           Subjective:   Bob Lagrave was seen and examined today. Sitting in chair, having lunch ( liquid diet), awaiting family meeting at 3pm today He denies pain, No nausea, vomiting, or bleeding overnight.   Objective:   Blood pressure 105/48, pulse 47, temperature 98 F (36.7 C), temperature source Oral, resp. rate 20, height 5\' 8"  (1.727 m), weight 70.308 kg (155 lb), SpO2 97 %.  Wt Readings from Last 3 Encounters:  12/27/15 70.308 kg (155 lb)  12/13/15 72.576 kg (160 lb)  10/09/15 72.576 kg (160 lb)     Intake/Output Summary (Last 24 hours) at 12/30/15 1402 Last data filed at 12/30/15 1349  Gross per 24 hour  Intake   1006 ml  Output   1625 ml  Net   -619 ml    Exam  General: Alert and oriented x 3, NAD, hearing deficit  HEENT:  PERRLA, EOMI  Neck: Supple  CVS: S1 S2 clear, RRR  Respiratory: Decreased breath sounds with mild scattered rhonchi  Abdomen: Soft,NT, NBS  Ext: no cyanosis clubbing or edema  Neuro: no new neuro deficits  Skin: No rashes  Psych: Normal affect and demeanor, alert and oriented x3    Data Review   Micro Results Recent Results (from the past 240 hour(s))  MRSA PCR Screening     Status: None   Collection Time: 12/25/15  8:53 AM  Result Value Ref Range Status   MRSA by PCR NEGATIVE NEGATIVE Final    Comment:        The GeneXpert MRSA Assay (FDA approved for NASAL specimens only), is one component of a comprehensive MRSA colonization surveillance program. It is not intended to diagnose MRSA infection nor to guide or monitor treatment for MRSA infections.     Radiology Reports Ct Chest W Contrast  12/27/2015  CLINICAL DATA:  Esophageal mass EXAM: CT CHEST, ABDOMEN, AND PELVIS WITH CONTRAST TECHNIQUE: Multidetector CT imaging of the chest, abdomen and pelvis was performed following the standard protocol during bolus administration of intravenous contrast. CONTRAST:  174mL OMNIPAQUE IOHEXOL 300 MG/ML  SOLN COMPARISON:   Esophagram dated 12/25/2015. FINDINGS: CT CHEST FINDINGS Mediastinum/Nodes: Heart is top-normal in size. Thinning at the left ventricular apex, suggesting prior myocardial infarction. Again noted is low density at the left ventricular apex which could reflect thrombus (series 2/ image 45). Three vessel coronary atherosclerosis. Atherosclerotic calcifications of the aortic arch. Small mediastinal lymph nodes, including a 6 mm subcarinal node, within normal limits. Polypoid mass along the anterior aspect of the distal esophagus just above the GE junction (series 2/ image 42), corresponding to the biopsied lesion on endoscopy. Visualized thyroid is unremarkable. Lungs/Pleura: Chronic lingular opacity/collapse (series 3/ image 35). Small left pleural effusion, mildly increased from 2014,  with associated left lower lobe atelectasis. Mild subpleural scarring in the anterior left upper lobe (series 3/ image 21). Additional mild scarring/atelectasis in the medial right middle lobe (series 3/image 46). Underlying mild emphysematous changes. Trace right pleural effusion. No pneumothorax. Musculoskeletal: Degenerative changes of the thoracic spine. CT ABDOMEN PELVIS FINDINGS Hepatobiliary: Unenhanced liver is unremarkable. Status post cholecystectomy. No intrahepatic or extrahepatic ductal dilatation. Pancreas: Pancreatic atrophy. Spleen: Within normal limits. Adrenals/Urinary Tract: 1.6 cm left adrenal adenoma (series 2/image 60), unchanged. 1.3 cm right adrenal adenoma (series 2/ image 69), unchanged. Kidneys are unremarkable. No renal, ureteral, or bladder calculi. Bladder is within normal limits. Stomach/Bowel: Stomach is notable for a small hiatal hernia. No evidence of bowel obstruction. Extensive colonic diverticulosis, without evidence of diverticulitis. Mild concentric wall thickening along the rectum (series 2/ image 108), favored to reflect underdistention. Vascular/Lymphatic: Atherosclerotic calcifications of the  abdominal aorta and branch vessels. Fusiform ectasia with saccular outpouching of the infrarenal abdominal aorta, measuring 3.1 x 2.3 cm (series 2/image 72), previously 3.0 x 2.5 cm in 2014. No suspicious abdominopelvic lymphadenopathy, although evaluation is mildly constrained due to lack of intravenous contrast administration. Reproductive: Prostate is notable for mild enlargement of the central gland which indents the base of the bladder. Other: No abdominopelvic ascites. Postsurgical changes related to prior left inguinal hernia repair. Musculoskeletal: Degenerative changes of the lumbar spine. IMPRESSION: Evaluation is constrained by lack of intravenous contrast administration. Polypoid mass along the anterior aspect of the distal esophagus above the GE junction. No findings specific for metastatic disease on unenhanced CT. Small left and trace right pleural effusions. Associated left lower lobe atelectasis. Chronic scarring/ atelectasis in the lingula. Bilateral adrenal adenomas. Additional ancillary findings as above. Electronically Signed   By: Julian Hy M.D.   On: 12/27/2015 21:18   Ct Abdomen Pelvis W Contrast  12/27/2015  CLINICAL DATA:  Esophageal mass EXAM: CT CHEST, ABDOMEN, AND PELVIS WITH CONTRAST TECHNIQUE: Multidetector CT imaging of the chest, abdomen and pelvis was performed following the standard protocol during bolus administration of intravenous contrast. CONTRAST:  144mL OMNIPAQUE IOHEXOL 300 MG/ML  SOLN COMPARISON:  Esophagram dated 12/25/2015. FINDINGS: CT CHEST FINDINGS Mediastinum/Nodes: Heart is top-normal in size. Thinning at the left ventricular apex, suggesting prior myocardial infarction. Again noted is low density at the left ventricular apex which could reflect thrombus (series 2/ image 45). Three vessel coronary atherosclerosis. Atherosclerotic calcifications of the aortic arch. Small mediastinal lymph nodes, including a 6 mm subcarinal node, within normal limits.  Polypoid mass along the anterior aspect of the distal esophagus just above the GE junction (series 2/ image 42), corresponding to the biopsied lesion on endoscopy. Visualized thyroid is unremarkable. Lungs/Pleura: Chronic lingular opacity/collapse (series 3/ image 35). Small left pleural effusion, mildly increased from 2014, with associated left lower lobe atelectasis. Mild subpleural scarring in the anterior left upper lobe (series 3/ image 21). Additional mild scarring/atelectasis in the medial right middle lobe (series 3/image 46). Underlying mild emphysematous changes. Trace right pleural effusion. No pneumothorax. Musculoskeletal: Degenerative changes of the thoracic spine. CT ABDOMEN PELVIS FINDINGS Hepatobiliary: Unenhanced liver is unremarkable. Status post cholecystectomy. No intrahepatic or extrahepatic ductal dilatation. Pancreas: Pancreatic atrophy. Spleen: Within normal limits. Adrenals/Urinary Tract: 1.6 cm left adrenal adenoma (series 2/image 60), unchanged. 1.3 cm right adrenal adenoma (series 2/ image 69), unchanged. Kidneys are unremarkable. No renal, ureteral, or bladder calculi. Bladder is within normal limits. Stomach/Bowel: Stomach is notable for a small hiatal hernia. No evidence of bowel obstruction. Extensive colonic diverticulosis, without  evidence of diverticulitis. Mild concentric wall thickening along the rectum (series 2/ image 108), favored to reflect underdistention. Vascular/Lymphatic: Atherosclerotic calcifications of the abdominal aorta and branch vessels. Fusiform ectasia with saccular outpouching of the infrarenal abdominal aorta, measuring 3.1 x 2.3 cm (series 2/image 72), previously 3.0 x 2.5 cm in 2014. No suspicious abdominopelvic lymphadenopathy, although evaluation is mildly constrained due to lack of intravenous contrast administration. Reproductive: Prostate is notable for mild enlargement of the central gland which indents the base of the bladder. Other: No  abdominopelvic ascites. Postsurgical changes related to prior left inguinal hernia repair. Musculoskeletal: Degenerative changes of the lumbar spine. IMPRESSION: Evaluation is constrained by lack of intravenous contrast administration. Polypoid mass along the anterior aspect of the distal esophagus above the GE junction. No findings specific for metastatic disease on unenhanced CT. Small left and trace right pleural effusions. Associated left lower lobe atelectasis. Chronic scarring/ atelectasis in the lingula. Bilateral adrenal adenomas. Additional ancillary findings as above. Electronically Signed   By: Julian Hy M.D.   On: 12/27/2015 21:18   Dg Esophagus  12/25/2015  CLINICAL DATA:  Dysphagia.  Nausea and vomiting. EXAM: ESOPHOGRAM/BARIUM SWALLOW TECHNIQUE: Single contrast examination was performed using  thin barium. FLUOROSCOPY TIME:  Radiation Exposure Index (as provided by the fluoroscopic device): If the device does not provide the exposure index: Fluoroscopy Time:  1 minutes 54 seconds Number of Acquired Images: COMPARISON:  None. FINDINGS: The patient was very short of breath and had difficulty swallowing barium. There is an irregular narrowing of the distal esophagus. The esophagus is dilated above the narrowing. Barium did pass through the area of narrowing which shows mucosal irregularity and is worrisome for carcinoma of the distal esophagus. This appears be partially obstructing. Limited evaluation of the remainder of the esophagus. Endoscopy recommended. IMPRESSION: Irregular mass distal esophagus consistent with carcinoma of the esophagus. This is partially obstructing. Endoscopy and biopsy recommended. Electronically Signed   By: Franchot Gallo M.D.   On: 12/25/2015 14:54   Dg Chest Port 1 View  12/26/2015  CLINICAL DATA:  82 year old male with shortness of Breath, dysphagia, evidence of esophageal tumor on recent esophagram. Initial encounter. EXAM: PORTABLE CHEST 1 VIEW COMPARISON:   Chest radiographs 09/21/2015.  Esophagram 12/25/2015. FINDINGS: Portable AP semi upright view at 0842 hours. Small a moderate left pleural effusion. Trace right pleural effusion. Mediastinal contours are stable since November. Visualized tracheal air column is within normal limits. No pneumothorax or pulmonary edema. Aside from the dense opacification of the left lung base, no confluent pulmonary opacity. IMPRESSION: Small to moderate left pleural effusion with left lung base collapse or consolidation. Electronically Signed   By: Genevie Ann M.D.   On: 12/26/2015 08:52    CBC  Recent Labs Lab 12/24/15 1540 12/25/15 0425 12/25/15 1419 12/25/15 1954 12/26/15 0348 12/27/15 0300  WBC 12.7* 11.3*  --   --   --  8.1  HGB 13.6 12.5* 12.9* 12.1* 11.9*  12.1* 11.4*  HCT 40.0 37.2* 38.7* 37.3* 36.4*  36.4* 34.5*  PLT 205 179  --   --   --  155  MCV 101.8* 102.5*  --   --   --  99.7  MCH 34.6* 34.4*  --   --   --  32.9  MCHC 34.0 33.6  --   --   --  33.0  RDW 13.7 13.8  --   --   --  13.5  LYMPHSABS  --  1.9  --   --   --   --  MONOABS  --  0.9  --   --   --   --   EOSABS  --  0.2  --   --   --   --   BASOSABS  --  0.0  --   --   --   --     Chemistries   Recent Labs Lab 12/24/15 1540  12/26/15 0348 12/27/15 0300 12/28/15 0540 12/29/15 0558 12/30/15 0957  NA 133*  < > 136 140 138 140 137  K 3.4*  < > 3.7 3.6 3.4* 3.7 3.7  CL 91*  < > 100* 100* 98* 97* 94*  CO2 29  < > 25 29 34* 34* 34*  GLUCOSE 148*  < > 113* 109* 99 111* 177*  BUN 21*  < > 7 8 6 8 7   CREATININE 1.20  < > 0.84 0.85 0.96 0.81 0.97  CALCIUM 8.7*  < > 8.6* 8.5* 8.7* 8.7* 8.8*  AST 16  --   --   --   --   --   --   ALT 12*  --   --   --   --   --   --   ALKPHOS 68  --   --   --   --   --   --   BILITOT 1.0  --   --   --   --   --   --   < > = values in this interval not displayed. ------------------------------------------------------------------------------------------------------------------ estimated creatinine  clearance is 58.8 mL/min (by C-G formula based on Cr of 0.97). ------------------------------------------------------------------------------------------------------------------ No results for input(s): HGBA1C in the last 72 hours. ------------------------------------------------------------------------------------------------------------------ No results for input(s): CHOL, HDL, LDLCALC, TRIG, CHOLHDL, LDLDIRECT in the last 72 hours. ------------------------------------------------------------------------------------------------------------------ No results for input(s): TSH, T4TOTAL, T3FREE, THYROIDAB in the last 72 hours.  Invalid input(s): FREET3 ------------------------------------------------------------------------------------------------------------------ No results for input(s): VITAMINB12, FOLATE, FERRITIN, TIBC, IRON, RETICCTPCT in the last 72 hours.  Coagulation profile  Recent Labs Lab 12/24/15 1730  INR 1.06    No results for input(s): DDIMER in the last 72 hours.  Cardiac Enzymes No results for input(s): CKMB, TROPONINI, MYOGLOBIN in the last 168 hours.  Invalid input(s): CK ------------------------------------------------------------------------------------------------------------------ Invalid input(s): POCBNP  No results for input(s): GLUCAP in the last 72 hours.   Zi Newbury M.D. PhD [facility] 12/30/2015, 2:02 PM  Pager: [phone]  After 7pm go to [email or web address] - password [email or web address]  Call night coverage person covering after 7pm

## 2015-12-31 LAB — BASIC METABOLIC PANEL
ANION GAP: 7 (ref 5–15)
BUN: 8 mg/dL (ref 6–20)
CALCIUM: 8.8 mg/dL — AB (ref 8.9–10.3)
CHLORIDE: 96 mmol/L — AB (ref 101–111)
CO2: 36 mmol/L — ABNORMAL HIGH (ref 22–32)
CREATININE: 0.97 mg/dL (ref 0.61–1.24)
GFR calc non Af Amer: >60 mL/min (ref >60)
Glucose, Bld: 119 mg/dL — ABNORMAL HIGH (ref 65–99)
Potassium: 3.8 mmol/L (ref 3.5–5.1)
SODIUM: 139 mmol/L (ref 135–145)

## 2015-12-31 MED ORDER — AMOXICILLIN-POT CLAVULANATE 875-125 MG PO TABS
1.0000 | ORAL_TABLET | Freq: Two times a day (BID) | ORAL | Status: DC
  Administered 2015-12-31 – 2016-01-01 (×2): 1 via ORAL
  Filled 2015-12-31 (×2): qty 1

## 2015-12-31 NOTE — Care Management Note (Signed)
Case Management Note  Patient Details  Name: Sean Figueroa MRN: [id number] Date of Birth: 1933-03-06  Subjective/Objective:                  Esophageal adenocarcinoma, clinical [id number]  Action/Plan: Discharge planning Expected Discharge Date:  01/01/16               Expected Discharge Plan:  Home w Hospice Care  In-House Referral:  Clinical Social Work  Discharge planning Services  CM Consult  Post Acute Care Choice:    Choice offered to:     DME Arranged:    DME Agency:     HH Arranged:    [place] Agency:  Other - See comment  Status of Service:  Completed, signed off  Medicare Important Message Given:  Yes Date Medicare IM Given:    Medicare IM give by:    Date Additional Medicare IM Given:    Additional Medicare Important Message give by:     If discussed at [place] of Stay Meetings, dates discussed:    Additional Comments: Main support, Janese Banks [phone] CM met with pt, friend Jenny Reichmann  and pt's special needs son, Deveron Furlong in room to offer choice of hospice agency for home.  Pt chooses [facility].  Referral called to [facility] of [place].  Per [facility], CM faxed facesheet with support system contacts, H&P, Palliative note, Home hospice order, current MAR to [place]: [facility].  CM notified MD home hospice will be with [facility] and MD notified discharge will be 01/01/16. Pt is oxygen dependent at home and Janese Banks will bring home tank to transport pt home when discharged.  No other CM needs were communicated. Dellie Catholic, RN 12/31/2015, 2:57 PM

## 2015-12-31 NOTE — Progress Notes (Addendum)
[facility]                                                                              Patient Demographics  Sean Figueroa, is a 82 y.o. male, DOB - 08/11/1933, [id number]  Admit date - 12/24/2015   Admitting Physician Ripudeep Krystal Eaton, MD  Outpatient Primary MD for the patient is [name], MD  LOS - 7   Chief Complaint  Patient presents with  . Melena  . Emesis  . Neck Pain       Brief HPI   Patient is a 82 year old male with CAD, on dual antiplatelet therapy with aspirin and Plavix, hypertension, hyperlipidemia, GERD, COPD, tobacco abuse Presented to ED with nausea, vomiting, melanotic stools. History was obtained from the patient who reported that he's been having nausea and vomiting for the last 6 days and has been having difficulty keeping anything down. He has no abdominal pain, no hematemesis. He kept trying to keep himself hydrated by drinking a lot of fluids. Since yesterday patient noticed having dark melanotic stools. He takes aspirin and Plavix. He noticed dizziness since yesterday but no chest pain or shortness of breath. He denied any recent endoscopy procedures. In ED patient was noted to have sodium of 133, potassium 3.4, creatinine 1.2, WBCs 12.7, hemoglobin 13.6, FOBT positive. MCV 101.8  No chest x-ray available.    Assessment & Plan   Principal Problem:  Esophageal adenocarcinoma/Nausea, vomiting with GI bleed, dysphagia:  - Esophagogram 2/6 showed irregular mass in the distal esophagus consistent with carcinoma of the esophagus, partially obstructing. - Continue to hold aspirin and Plavix - Continue Protonix  - CT chest, abdomen and pelvis with esophageal mass but no metastasis - Oncology following  Active Problems: Aspiration pneumonia- improving - On 2/7, patient was complaining of productive cough, CXR showed left lung small to moderate pleural effusion with consolidation - Placed on DuoNeb's, IV Zosyn from 2/7 to 2/12,  augmentin from 2/12   Tobacco abuse - Patient counseled strongly on tobacco cessation, placed on nicotine patch   Hypokalemia - Replaced  (HCC)/ischemic heart disease, MI, DES in 2013 to circumflex and left main - Hold aspirin, Plavix - Restarted Coreg, Lasix   COPD, severe ([place]) - Patient still continues to smoke, recommended strongly to quit smoking - Placed on O2, DuoNeb   Chronic systolic heart failure ([place]) - 2-D echo 11/15 had shown EF of [id number], grade 1 diastolic dysfunction - Continue to hold aspirin and Plavix for endoscopy - Restart Lasix, Coreg - Hold Avapro, Imdur,  2/12: borderline bp on low dose coreg, d/c coreg.   GERD (gastroesophageal reflux disease) - Continue PPI  Deconditioning - PTOT evaluation prior to discharge  DVT prophylaxis: SCDs   Code Status: DNR  Family Communication: Discussed in detail with the patient, all imaging results, lab results explained to the patient   Disposition Plan:  Home with home hospice on 2/13  Time Spent in minutes   15 minutes  Procedures  CXR esophagogram   Consults   GI /oncology/palliative care  DVT Prophylaxis  SCD's  Medications  Scheduled Meds: . aspirin EC  81 mg Oral Daily  .  carvedilol  3.125 mg Oral BID WC  . feeding supplement (ENSURE ENLIVE)  237 mL Oral BID BM  . furosemide  20 mg Oral Daily  . ipratropium-albuterol  3 mL Nebulization QID  . nicotine  21 mg Transdermal Daily  . pantoprazole  40 mg Oral Daily  . piperacillin-tazobactam (ZOSYN)  IV  3.375 g Intravenous Q8H  . pramipexole  0.125 mg Oral QHS   Continuous Infusions:   PRN Meds:.acetaminophen **OR** acetaminophen, albuterol, ALPRAZolam, guaiFENesin-dextromethorphan, HYDROmorphone (DILAUDID) injection, meclizine, ondansetron **OR** ondansetron (ZOFRAN) IV   Antibiotics   Anti-infectives    Start     Dose/Rate Route Frequency Ordered Stop   12/26/15 1200  piperacillin-tazobactam (ZOSYN) IVPB 3.375 g     3.375 g 12.5  mL/hr over 240 Minutes Intravenous Every 8 hours 12/26/15 1024          Subjective:   Sean Figueroa was seen and examined today. Sitting in chair, having lunch ( liquid diet), awaiting family meeting at 3pm today He denies pain, No nausea, vomiting, or bleeding overnight.   Objective:   Blood pressure 103/55, pulse 72, temperature 98.2 F (36.8 C), temperature source Oral, resp. rate 18, height 5\' 8"  (1.727 m), weight 70.308 kg (155 lb), SpO2 99 %.  Wt Readings from Last 3 Encounters:  12/27/15 70.308 kg (155 lb)  12/13/15 72.576 kg (160 lb)  10/09/15 72.576 kg (160 lb)     Intake/Output Summary (Last 24 hours) at 12/31/15 1554 Last data filed at 12/31/15 1300  Gross per 24 hour  Intake    630 ml  Output    450 ml  Net    180 ml    Exam  General: Alert and oriented x 3, NAD, hearing deficit  HEENT:  PERRLA, EOMI  Neck: Supple  CVS: S1 S2 clear, RRR  Respiratory: Decreased breath sounds with mild scattered rhonchi  Abdomen: Soft,NT, NBS  Ext: no cyanosis clubbing or edema  Neuro: no new neuro deficits  Skin: No rashes  Psych: Normal affect and demeanor, alert and oriented x3    Data Review   Micro Results Recent Results (from the past 240 hour(s))  MRSA PCR Screening     Status: None   Collection Time: 12/25/15  8:53 AM  Result Value Ref Range Status   MRSA by PCR NEGATIVE NEGATIVE Final    Comment:        The GeneXpert MRSA Assay (FDA approved for NASAL specimens only), is one component of a comprehensive MRSA colonization surveillance program. It is not intended to diagnose MRSA infection nor to guide or monitor treatment for MRSA infections.     Radiology Reports Ct Chest W Contrast  12/27/2015  CLINICAL DATA:  Esophageal mass EXAM: CT CHEST, ABDOMEN, AND PELVIS WITH CONTRAST TECHNIQUE: Multidetector CT imaging of the chest, abdomen and pelvis was performed following the standard protocol during bolus administration of intravenous contrast.  CONTRAST:  191mL OMNIPAQUE IOHEXOL 300 MG/ML  SOLN COMPARISON:  Esophagram dated 12/25/2015. FINDINGS: CT CHEST FINDINGS Mediastinum/Nodes: Heart is top-normal in size. Thinning at the left ventricular apex, suggesting prior myocardial infarction. Again noted is low density at the left ventricular apex which could reflect thrombus (series 2/ image 45). Three vessel coronary atherosclerosis. Atherosclerotic calcifications of the aortic arch. Small mediastinal lymph nodes, including a 6 mm subcarinal node, within normal limits. Polypoid mass along the anterior aspect of the distal esophagus just above the GE junction (series 2/ image 42), corresponding to the biopsied lesion on endoscopy. Visualized thyroid  is unremarkable. Lungs/Pleura: Chronic lingular opacity/collapse (series 3/ image 35). Small left pleural effusion, mildly increased from 2014, with associated left lower lobe atelectasis. Mild subpleural scarring in the anterior left upper lobe (series 3/ image 21). Additional mild scarring/atelectasis in the medial right middle lobe (series 3/image 46). Underlying mild emphysematous changes. Trace right pleural effusion. No pneumothorax. Musculoskeletal: Degenerative changes of the thoracic spine. CT ABDOMEN PELVIS FINDINGS Hepatobiliary: Unenhanced liver is unremarkable. Status post cholecystectomy. No intrahepatic or extrahepatic ductal dilatation. Pancreas: Pancreatic atrophy. Spleen: Within normal limits. Adrenals/Urinary Tract: 1.6 cm left adrenal adenoma (series 2/image 60), unchanged. 1.3 cm right adrenal adenoma (series 2/ image 69), unchanged. Kidneys are unremarkable. No renal, ureteral, or bladder calculi. Bladder is within normal limits. Stomach/Bowel: Stomach is notable for a small hiatal hernia. No evidence of bowel obstruction. Extensive colonic diverticulosis, without evidence of diverticulitis. Mild concentric wall thickening along the rectum (series 2/ image 108), favored to reflect  underdistention. Vascular/Lymphatic: Atherosclerotic calcifications of the abdominal aorta and branch vessels. Fusiform ectasia with saccular outpouching of the infrarenal abdominal aorta, measuring 3.1 x 2.3 cm (series 2/image 72), previously 3.0 x 2.5 cm in 2014. No suspicious abdominopelvic lymphadenopathy, although evaluation is mildly constrained due to lack of intravenous contrast administration. Reproductive: Prostate is notable for mild enlargement of the central gland which indents the base of the bladder. Other: No abdominopelvic ascites. Postsurgical changes related to prior left inguinal hernia repair. Musculoskeletal: Degenerative changes of the lumbar spine. IMPRESSION: Evaluation is constrained by lack of intravenous contrast administration. Polypoid mass along the anterior aspect of the distal esophagus above the GE junction. No findings specific for metastatic disease on unenhanced CT. Small left and trace right pleural effusions. Associated left lower lobe atelectasis. Chronic scarring/ atelectasis in the lingula. Bilateral adrenal adenomas. Additional ancillary findings as above. Electronically Signed   By: Julian Hy M.D.   On: 12/27/2015 21:18   Ct Abdomen Pelvis W Contrast  12/27/2015  CLINICAL DATA:  Esophageal mass EXAM: CT CHEST, ABDOMEN, AND PELVIS WITH CONTRAST TECHNIQUE: Multidetector CT imaging of the chest, abdomen and pelvis was performed following the standard protocol during bolus administration of intravenous contrast. CONTRAST:  18mL OMNIPAQUE IOHEXOL 300 MG/ML  SOLN COMPARISON:  Esophagram dated 12/25/2015. FINDINGS: CT CHEST FINDINGS Mediastinum/Nodes: Heart is top-normal in size. Thinning at the left ventricular apex, suggesting prior myocardial infarction. Again noted is low density at the left ventricular apex which could reflect thrombus (series 2/ image 45). Three vessel coronary atherosclerosis. Atherosclerotic calcifications of the aortic arch. Small mediastinal  lymph nodes, including a 6 mm subcarinal node, within normal limits. Polypoid mass along the anterior aspect of the distal esophagus just above the GE junction (series 2/ image 42), corresponding to the biopsied lesion on endoscopy. Visualized thyroid is unremarkable. Lungs/Pleura: Chronic lingular opacity/collapse (series 3/ image 35). Small left pleural effusion, mildly increased from 2014, with associated left lower lobe atelectasis. Mild subpleural scarring in the anterior left upper lobe (series 3/ image 21). Additional mild scarring/atelectasis in the medial right middle lobe (series 3/image 46). Underlying mild emphysematous changes. Trace right pleural effusion. No pneumothorax. Musculoskeletal: Degenerative changes of the thoracic spine. CT ABDOMEN PELVIS FINDINGS Hepatobiliary: Unenhanced liver is unremarkable. Status post cholecystectomy. No intrahepatic or extrahepatic ductal dilatation. Pancreas: Pancreatic atrophy. Spleen: Within normal limits. Adrenals/Urinary Tract: 1.6 cm left adrenal adenoma (series 2/image 60), unchanged. 1.3 cm right adrenal adenoma (series 2/ image 69), unchanged. Kidneys are unremarkable. No renal, ureteral, or bladder calculi. Bladder is within normal limits.  Stomach/Bowel: Stomach is notable for a small hiatal hernia. No evidence of bowel obstruction. Extensive colonic diverticulosis, without evidence of diverticulitis. Mild concentric wall thickening along the rectum (series 2/ image 108), favored to reflect underdistention. Vascular/Lymphatic: Atherosclerotic calcifications of the abdominal aorta and branch vessels. Fusiform ectasia with saccular outpouching of the infrarenal abdominal aorta, measuring 3.1 x 2.3 cm (series 2/image 72), previously 3.0 x 2.5 cm in 2014. No suspicious abdominopelvic lymphadenopathy, although evaluation is mildly constrained due to lack of intravenous contrast administration. Reproductive: Prostate is notable for mild enlargement of the central  gland which indents the base of the bladder. Other: No abdominopelvic ascites. Postsurgical changes related to prior left inguinal hernia repair. Musculoskeletal: Degenerative changes of the lumbar spine. IMPRESSION: Evaluation is constrained by lack of intravenous contrast administration. Polypoid mass along the anterior aspect of the distal esophagus above the GE junction. No findings specific for metastatic disease on unenhanced CT. Small left and trace right pleural effusions. Associated left lower lobe atelectasis. Chronic scarring/ atelectasis in the lingula. Bilateral adrenal adenomas. Additional ancillary findings as above. Electronically Signed   By: Julian Hy M.D.   On: 12/27/2015 21:18   Dg Esophagus  12/25/2015  CLINICAL DATA:  Dysphagia.  Nausea and vomiting. EXAM: ESOPHOGRAM/BARIUM SWALLOW TECHNIQUE: Single contrast examination was performed using  thin barium. FLUOROSCOPY TIME:  Radiation Exposure Index (as provided by the fluoroscopic device): If the device does not provide the exposure index: Fluoroscopy Time:  1 minutes 54 seconds Number of Acquired Images: COMPARISON:  None. FINDINGS: The patient was very short of breath and had difficulty swallowing barium. There is an irregular narrowing of the distal esophagus. The esophagus is dilated above the narrowing. Barium did pass through the area of narrowing which shows mucosal irregularity and is worrisome for carcinoma of the distal esophagus. This appears be partially obstructing. Limited evaluation of the remainder of the esophagus. Endoscopy recommended. IMPRESSION: Irregular mass distal esophagus consistent with carcinoma of the esophagus. This is partially obstructing. Endoscopy and biopsy recommended. Electronically Signed   By: Franchot Gallo M.D.   On: 12/25/2015 14:54   Dg Chest Port 1 View  12/26/2015  CLINICAL DATA:  82 year old male with shortness of Breath, dysphagia, evidence of esophageal tumor on recent esophagram.  Initial encounter. EXAM: PORTABLE CHEST 1 VIEW COMPARISON:  Chest radiographs 09/21/2015.  Esophagram 12/25/2015. FINDINGS: Portable AP semi upright view at 0842 hours. Small a moderate left pleural effusion. Trace right pleural effusion. Mediastinal contours are stable since November. Visualized tracheal air column is within normal limits. No pneumothorax or pulmonary edema. Aside from the dense opacification of the left lung base, no confluent pulmonary opacity. IMPRESSION: Small to moderate left pleural effusion with left lung base collapse or consolidation. Electronically Signed   By: Genevie Ann M.D.   On: 12/26/2015 08:52    CBC  Recent Labs Lab 12/25/15 0425 12/25/15 1419 12/25/15 1954 12/26/15 0348 12/27/15 0300  WBC 11.3*  --   --   --  8.1  HGB 12.5* 12.9* 12.1* 11.9*  12.1* 11.4*  HCT 37.2* 38.7* 37.3* 36.4*  36.4* 34.5*  PLT 179  --   --   --  155  MCV 102.5*  --   --   --  99.7  MCH 34.4*  --   --   --  32.9  MCHC 33.6  --   --   --  33.0  RDW 13.8  --   --   --  13.5  LYMPHSABS 1.9  --   --   --   --  MONOABS 0.9  --   --   --   --   EOSABS 0.2  --   --   --   --   BASOSABS 0.0  --   --   --   --     Chemistries   Recent Labs Lab 12/27/15 0300 12/28/15 0540 12/29/15 0558 12/30/15 0957 12/31/15 0333  NA 140 138 140 137 139  K 3.6 3.4* 3.7 3.7 3.8  CL 100* 98* 97* 94* 96*  CO2 29 34* 34* 34* 36*  GLUCOSE 109* 99 111* 177* 119*  BUN 8 6 8 7 8   CREATININE 0.85 0.96 0.81 0.97 0.97  CALCIUM 8.5* 8.7* 8.7* 8.8* 8.8*   ------------------------------------------------------------------------------------------------------------------ estimated creatinine clearance is 58.8 mL/min (by C-G formula based on Cr of 0.97). ------------------------------------------------------------------------------------------------------------------ No results for input(s): HGBA1C in the last 72  hours. ------------------------------------------------------------------------------------------------------------------ No results for input(s): CHOL, HDL, LDLCALC, TRIG, CHOLHDL, LDLDIRECT in the last 72 hours. ------------------------------------------------------------------------------------------------------------------ No results for input(s): TSH, T4TOTAL, T3FREE, THYROIDAB in the last 72 hours.  Invalid input(s): FREET3 ------------------------------------------------------------------------------------------------------------------ No results for input(s): VITAMINB12, FOLATE, FERRITIN, TIBC, IRON, RETICCTPCT in the last 72 hours.  Coagulation profile  Recent Labs Lab 12/24/15 1730  INR 1.06    No results for input(s): DDIMER in the last 72 hours.  Cardiac Enzymes No results for input(s): CKMB, TROPONINI, MYOGLOBIN in the last 168 hours.  Invalid input(s): CK ------------------------------------------------------------------------------------------------------------------ Invalid input(s): POCBNP  No results for input(s): GLUCAP in the last 72 hours.   Chenise Mulvihill M.D. PhD [facility] 12/31/2015, 3:54 PM  Pager: [phone]  After 7pm go to [email or web address] - password [email or web address]  Call night coverage person covering after 7pm

## 2016-01-01 ENCOUNTER — Encounter: Payer: Self-pay | Admitting: *Deleted

## 2016-01-01 ENCOUNTER — Telehealth: Payer: Self-pay | Admitting: *Deleted

## 2016-01-01 DIAGNOSIS — Z66 Do not resuscitate: Secondary | ICD-10-CM

## 2016-01-01 DIAGNOSIS — E876 Hypokalemia: Secondary | ICD-10-CM

## 2016-01-01 DIAGNOSIS — K921 Melena: Secondary | ICD-10-CM

## 2016-01-01 DIAGNOSIS — C155 Malignant neoplasm of lower third of esophagus: Secondary | ICD-10-CM

## 2016-01-01 MED ORDER — FUROSEMIDE 20 MG PO TABS
20.0000 mg | ORAL_TABLET | ORAL | Status: DC
Start: 2016-01-01 — End: 2016-01-30

## 2016-01-01 MED ORDER — POTASSIUM CHLORIDE CRYS ER 20 MEQ PO TBCR
20.0000 meq | EXTENDED_RELEASE_TABLET | Freq: Every day | ORAL | Status: DC
Start: 2016-01-01 — End: 2016-05-06

## 2016-01-01 MED ORDER — ALPRAZOLAM 0.25 MG PO TABS: 0.2500 mg | ORAL_TABLET | Freq: Two times a day (BID) | ORAL | Status: DC | PRN

## 2016-01-01 MED ORDER — AMOXICILLIN-POT CLAVULANATE 875-125 MG PO TABS: 1.0000 | ORAL_TABLET | Freq: Two times a day (BID) | ORAL | Status: DC

## 2016-01-01 MED ORDER — METOPROLOL TARTRATE 25 MG PO TABS: 12.5000 mg | ORAL_TABLET | Freq: Two times a day (BID) | ORAL | Status: AC

## 2016-01-01 NOTE — Care Management Important Message (Signed)
Important Message  Patient Details  Name: Sean Figueroa MRN: [id number] Date of Birth: 07-Dec-1932   Medicare Important Message Given:  Yes    [name] [name] 01/01/2016, 2:18 PM

## 2016-01-01 NOTE — Discharge Summary (Signed)
Discharge Summary  Sean Figueroa [id number] DOB: 15-Oct-1933  PCP: [name], MD  Admit date: 12/24/2015 Discharge date: 01/01/2016  Time spent: >61mins  Recommendations for Outpatient Follow-up:  1. F/u with radiation oncology Dr Lisbeth Renshaw on 01/03/2016 2. Patient is discharged to home with home hospice  Discharge Diagnoses:  Active Hospital Problems   Diagnosis Date Noted  . GI bleed 12/24/2015  . Palliative care encounter   . Malignant neoplasm of lower third of esophagus ([place])   . 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 Esophageal mass   . Dysphagia   . Abnormal finding on imaging   . GERD (gastroesophageal reflux disease) 12/24/2015  . Nausea & vomiting 12/24/2015  . Leukocytosis 12/24/2015  . Esophageal reflux   . Chronic systolic heart failure ([place]) 09/21/2015  . COPD, severe ([place]) 02/16/2015  . PAF (paroxysmal atrial fibrillation) ([place]) 03/24/2012  . Hypokalemia 01/15/2012  . Tobacco abuse 02/27/2011    Resolved Hospital Problems   Diagnosis Date Noted Date Resolved  No resolved problems to display.    Discharge Condition: stable  Diet recommendation: diet as tolerated  Filed Weights   12/25/15 0822 12/27/15 0400  Weight: 68.675 kg (151 lb 6.4 oz) 70.308 kg (155 lb)    History of present illness:  (per admitting MD Dr. Tana Coast) Patient is a 82 year old male with CAD, on dual antiplatelet therapy with aspirin and Plavix, hypertension, hyperlipidemia, GERD, COPD, tobacco abuse Presented to ED with nausea, vomiting, melanotic stools. History was obtained from the patient who reported that he's been having nausea and vomiting for the last 6 days and has been having difficulty keeping anything down. He has no abdominal pain, no hematemesis. He kept trying to keep himself hydrated by drinking a lot of fluids. Since yesterday patient noticed having dark melanotic stools. He takes aspirin and Plavix. He noticed dizziness since yesterday but no chest pain or shortness  of breath. He denied any recent endoscopy procedures. In ED patient was noted to have sodium of 133, potassium 3.4, creatinine 1.2, WBCs 12.7, hemoglobin 13.6, FOBT positive. MCV 101.8  No chest x-ray available.   Hospital Course:  Principal Problem:   GI bleed Active Problems:   Tobacco abuse   Hypokalemia   PAF (paroxysmal atrial fibrillation) (H[facility])   COPD, severe (HCC)   Chronic systolic heart failure (HCC)   Esophageal reflux   GERD (gastroesophageal reflux disease)   Nausea & vomiting   Leukocytosis   Dysphagia   Abnormal finding on imaging   Esophageal mass   Malignant neoplasm of lower third of esophagus (H[facility])   Adenosquamous carcinoma of esophagus (HCC)   Palliative care encounter  Esophageal adenocarcinoma/Nausea, vomiting with GI bleed, dysphagia:  - Esophagogram 2/6 showed irregular mass in the distal esophagus consistent with carcinoma of the esophagus, partially obstructing. - Continue to hold aspirin and Plavix - Continue Protonix  - CT chest, abdomen and pelvis with esophageal mass but no metastasis - Oncology following, outpatient rad/onc follow up. Discharge home with home hospice.  Active Problems: Aspiration pneumonia- improving - On 2/7, patient was complaining of productive cough, CXR showed left lung small to moderate pleural effusion with consolidation - Placed on DuoNeb's, IV Zosyn from 2/7 to 2/12, augmentin from 2/12   Tobacco abuse - Patient counseled strongly on tobacco cessation, placed on nicotine patch   Hypokalemia - Replaced  (HCC)/ischemic heart disease, MI, DES in 2013 to circumflex and left main - Hold aspirin, Plavix - Restarted Coreg, Lasix -meds adjusted at discharge, coreg  discontinued, on low dose lopressor, lasix changed to every MWF   COPD, severe ([place]) - Patient still continues to smoke, recommended strongly to quit smoking - on O2, DuoNeb   Chronic systolic heart failure ([place]) - 2-D echo 11/15 had shown EF of  [id number], grade 1 diastolic dysfunction - Continue to hold aspirin and Plavix for endoscopy - Restart Lasix, Coreg - Hold Avapro, Imdur,  2/12: borderline bp on low dose coreg, d/c coreg. 2/13 ; sbp in 130's, d/c home on low dose lopressor. Change lasix from daily to every MWF   GERD (gastroesophageal reflux disease) - Continue PPI  Deconditioning - discharge to home hospice   Code Status: DNR  Family Communication: Discussed in detail with the patient, all imaging results, lab results explained to the patient   Disposition Plan: Home with home hospice on 2/13   Procedures  CXR esophagogram   Consults  GI /oncology/palliative care  Discharge Exam: BP 137/62 mmHg  Pulse 41  Temp(Src) 97.9 F (36.6 C) (Oral)  Resp 20  Ht 5\' 8"  (1.727 m)  Wt 70.308 kg (155 lb)  BMI 23.57 kg/m2  SpO2 95%   General: Alert and oriented x 3, NAD, hearing deficit  HEENT: PERRLA, EOMI  Neck: Supple  CVS: S1 S2 clear, RRR  Respiratory: Decreased breath sounds with mild scattered rhonchi  Abdomen: Soft,NT, NBS  Ext: no cyanosis clubbing or edema  Neuro: no new neuro deficits  Skin: No rashes  Psych: Normal affect and demeanor, alert and oriented x3   Discharge Instructions You were cared for by a hospitalist during your hospital stay. If you have any questions about your discharge medications or the care you received while you were in the hospital after you are discharged, you can call the unit and asked to speak with the hospitalist on call if the hospitalist that took care of you is not available. Once you are discharged, your primary care physician will handle any further medical issues. Please note that NO REFILLS for any discharge medications will be authorized once you are discharged, as it is imperative that you return to your primary care physician (or establish a relationship with a primary care physician if you do not have one) for your aftercare needs so that they  can reassess your need for medications and monitor your lab values.      Discharge Instructions    Diet - low sodium heart healthy    Complete by:  As directed   Full liquid diet, advance as tolerated     Increase activity slowly    Complete by:  As directed             Medication List    STOP taking these medications        clopidogrel 75 MG tablet  Commonly known as:  PLAVIX     irbesartan 75 MG tablet  Commonly known as:  AVAPRO     isosorbide mononitrate 60 MG 24 hr tablet  Commonly known as:  IMDUR     OXYGEN     rosuvastatin 10 MG tablet  Commonly known as:  CRESTOR      TAKE these medications        allopurinol 100 MG tablet  Commonly known as:  ZYLOPRIM  Take 100 mg by mouth daily.     ALPRAZolam 0.25 MG tablet  Commonly known as:  XANAX  Take 1 tablet (0.25 mg total) by mouth 2 (two) times daily as needed for anxiety.  amoxicillin-clavulanate 875-125 MG tablet  Commonly known as:  AUGMENTIN  Take 1 tablet by mouth 2 (two) times daily.     aspirin EC 81 MG tablet  Take 81 mg by mouth daily.     CALCIUM PO  Take 1 tablet by mouth daily.     CVS D3 1000 units capsule  Generic drug:  Cholecalciferol  Take 1,000 Units by mouth daily.     feeding supplement (ENSURE ENLIVE) Liqd  Take 237 mLs by mouth 2 (two) times daily between meals.     fish oil-omega-3 fatty acids 1000 MG capsule  Take 1 g by mouth 2 (two) times daily.     furosemide 20 MG tablet  Commonly known as:  LASIX  Take 1 tablet (20 mg total) by mouth every Monday, Wednesday, and Friday.     guaiFENesin 600 MG 12 hr tablet  Commonly known as:  MUCINEX  Take 1,200 mg by mouth 2 (two) times daily as needed for cough or to loosen phlegm.     ipratropium 0.02 % nebulizer solution  Commonly known as:  ATROVENT  Take 0.5 mg by nebulization 4 (four) times daily.     meclizine 25 MG tablet  Commonly known as:  ANTIVERT  Take 1 tablet (25 mg total) by mouth 3 (three) times daily as  needed for dizziness. Additional refills from PCP     metoprolol tartrate 25 MG tablet  Commonly known as:  LOPRESSOR  Take 0.5 tablets (12.5 mg total) by mouth 2 (two) times daily.     NITROSTAT 0.4 MG SL tablet  Generic drug:  nitroGLYCERIN  PLACE 1 TABLET (0.4 MG TOTAL) UNDER THE TONGUE EVERY 5 (FIVE) MINUTES AS NEEDED.     pantoprazole 40 MG tablet  Commonly known as:  PROTONIX  TAKE 1 TABLET BY MOUTH DAILY     potassium chloride SA 20 MEQ tablet  Commonly known as:  KLOR-CON M20  Take 1 tablet (20 mEq total) by mouth daily.     pramipexole 0.125 MG tablet  Commonly known as:  MIRAPEX  Take 0.125 mg by mouth at bedtime.     PROAIR HFA 108 (90 Base) MCG/ACT inhaler  Generic drug:  albuterol  INHALE 2 PUFFS INTO LUNGS EVERY 6 HOURS AS NEEDED FOR WHEEZING     ranitidine 150 MG capsule  Commonly known as:  ZANTAC  Take 150 mg by mouth at bedtime.     temazepam 30 MG capsule  Commonly known as:  RESTORIL  TAKE ONE CAPSULE BY MOUTH AT BEDTIME AS NEEDED     traMADol 50 MG tablet  Commonly known as:  ULTRAM  Take 50 mg by mouth every 6 (six) hours as needed for moderate pain.       Allergies  Allergen Reactions  . Indomethacin Other (See Comments)    Unknown reaction  . Lipitor [Atorvastatin Calcium] Other (See Comments)    Leg pain  . Pravachol Other (See Comments)    Leg pain  . Procardia [Nifedipine] Other (See Comments)    Reaction unknown  . Zaroxolyn [Metolazone] Other (See Comments)    Reaction unknown   Follow-up Information    Follow up with [facility].   Specialty:  Hospice Services   Why:  your home hospice agency   Contact information:   [address] [place] [address] [phone]       Follow up with Kyung Rudd, MD On 01/03/2016.   Specialty:  Radiation Oncology  Why:  8:30 am   Contact information:   [address] [place] [address] [phone]        The results of significant diagnostics from this  hospitalization (including imaging, microbiology, ancillary and laboratory) are listed below for reference.    Significant Diagnostic Studies: Ct Chest W Contrast  12/27/2015  CLINICAL DATA:  Esophageal mass EXAM: CT CHEST, ABDOMEN, AND PELVIS WITH CONTRAST TECHNIQUE: Multidetector CT imaging of the chest, abdomen and pelvis was performed following the standard protocol during bolus administration of intravenous contrast. CONTRAST:  180mL OMNIPAQUE IOHEXOL 300 MG/ML  SOLN COMPARISON:  Esophagram dated 12/25/2015. FINDINGS: CT CHEST FINDINGS Mediastinum/Nodes: Heart is top-normal in size. Thinning at the left ventricular apex, suggesting prior myocardial infarction. Again noted is low density at the left ventricular apex which could reflect thrombus (series 2/ image 45). Three vessel coronary atherosclerosis. Atherosclerotic calcifications of the aortic arch. Small mediastinal lymph nodes, including a 6 mm subcarinal node, within normal limits. Polypoid mass along the anterior aspect of the distal esophagus just above the GE junction (series 2/ image 42), corresponding to the biopsied lesion on endoscopy. Visualized thyroid is unremarkable. Lungs/Pleura: Chronic lingular opacity/collapse (series 3/ image 35). Small left pleural effusion, mildly increased from 2014, with associated left lower lobe atelectasis. Mild subpleural scarring in the anterior left upper lobe (series 3/ image 21). Additional mild scarring/atelectasis in the medial right middle lobe (series 3/image 46). Underlying mild emphysematous changes. Trace right pleural effusion. No pneumothorax. Musculoskeletal: Degenerative changes of the thoracic spine. CT ABDOMEN PELVIS FINDINGS Hepatobiliary: Unenhanced liver is unremarkable. Status post cholecystectomy. No intrahepatic or extrahepatic ductal dilatation. Pancreas: Pancreatic atrophy. Spleen: Within normal limits. Adrenals/Urinary Tract: 1.6 cm left adrenal adenoma (series 2/image 60), unchanged.  1.3 cm right adrenal adenoma (series 2/ image 69), unchanged. Kidneys are unremarkable. No renal, ureteral, or bladder calculi. Bladder is within normal limits. Stomach/Bowel: Stomach is notable for a small hiatal hernia. No evidence of bowel obstruction. Extensive colonic diverticulosis, without evidence of diverticulitis. Mild concentric wall thickening along the rectum (series 2/ image 108), favored to reflect underdistention. Vascular/Lymphatic: Atherosclerotic calcifications of the abdominal aorta and branch vessels. Fusiform ectasia with saccular outpouching of the infrarenal abdominal aorta, measuring 3.1 x 2.3 cm (series 2/image 72), previously 3.0 x 2.5 cm in 2014. No suspicious abdominopelvic lymphadenopathy, although evaluation is mildly constrained due to lack of intravenous contrast administration. Reproductive: Prostate is notable for mild enlargement of the central gland which indents the base of the bladder. Other: No abdominopelvic ascites. Postsurgical changes related to prior left inguinal hernia repair. Musculoskeletal: Degenerative changes of the lumbar spine. IMPRESSION: Evaluation is constrained by lack of intravenous contrast administration. Polypoid mass along the anterior aspect of the distal esophagus above the GE junction. No findings specific for metastatic disease on unenhanced CT. Small left and trace right pleural effusions. Associated left lower lobe atelectasis. Chronic scarring/ atelectasis in the lingula. Bilateral adrenal adenomas. Additional ancillary findings as above. Electronically Signed   By: Julian Hy M.D.   On: 12/27/2015 21:18   Ct Abdomen Pelvis W Contrast  12/27/2015  CLINICAL DATA:  Esophageal mass EXAM: CT CHEST, ABDOMEN, AND PELVIS WITH CONTRAST TECHNIQUE: Multidetector CT imaging of the chest, abdomen and pelvis was performed following the standard protocol during bolus administration of intravenous contrast. CONTRAST:  157mL OMNIPAQUE IOHEXOL 300 MG/ML   SOLN COMPARISON:  Esophagram dated 12/25/2015. FINDINGS: CT CHEST FINDINGS Mediastinum/Nodes: Heart is top-normal in size. Thinning at the left ventricular apex, suggesting prior myocardial  infarction. Again noted is low density at the left ventricular apex which could reflect thrombus (series 2/ image 45). Three vessel coronary atherosclerosis. Atherosclerotic calcifications of the aortic arch. Small mediastinal lymph nodes, including a 6 mm subcarinal node, within normal limits. Polypoid mass along the anterior aspect of the distal esophagus just above the GE junction (series 2/ image 42), corresponding to the biopsied lesion on endoscopy. Visualized thyroid is unremarkable. Lungs/Pleura: Chronic lingular opacity/collapse (series 3/ image 35). Small left pleural effusion, mildly increased from 2014, with associated left lower lobe atelectasis. Mild subpleural scarring in the anterior left upper lobe (series 3/ image 21). Additional mild scarring/atelectasis in the medial right middle lobe (series 3/image 46). Underlying mild emphysematous changes. Trace right pleural effusion. No pneumothorax. Musculoskeletal: Degenerative changes of the thoracic spine. CT ABDOMEN PELVIS FINDINGS Hepatobiliary: Unenhanced liver is unremarkable. Status post cholecystectomy. No intrahepatic or extrahepatic ductal dilatation. Pancreas: Pancreatic atrophy. Spleen: Within normal limits. Adrenals/Urinary Tract: 1.6 cm left adrenal adenoma (series 2/image 60), unchanged. 1.3 cm right adrenal adenoma (series 2/ image 69), unchanged. Kidneys are unremarkable. No renal, ureteral, or bladder calculi. Bladder is within normal limits. Stomach/Bowel: Stomach is notable for a small hiatal hernia. No evidence of bowel obstruction. Extensive colonic diverticulosis, without evidence of diverticulitis. Mild concentric wall thickening along the rectum (series 2/ image 108), favored to reflect underdistention. Vascular/Lymphatic: Atherosclerotic  calcifications of the abdominal aorta and branch vessels. Fusiform ectasia with saccular outpouching of the infrarenal abdominal aorta, measuring 3.1 x 2.3 cm (series 2/image 72), previously 3.0 x 2.5 cm in 2014. No suspicious abdominopelvic lymphadenopathy, although evaluation is mildly constrained due to lack of intravenous contrast administration. Reproductive: Prostate is notable for mild enlargement of the central gland which indents the base of the bladder. Other: No abdominopelvic ascites. Postsurgical changes related to prior left inguinal hernia repair. Musculoskeletal: Degenerative changes of the lumbar spine. IMPRESSION: Evaluation is constrained by lack of intravenous contrast administration. Polypoid mass along the anterior aspect of the distal esophagus above the GE junction. No findings specific for metastatic disease on unenhanced CT. Small left and trace right pleural effusions. Associated left lower lobe atelectasis. Chronic scarring/ atelectasis in the lingula. Bilateral adrenal adenomas. Additional ancillary findings as above. Electronically Signed   By: Julian Hy M.D.   On: 12/27/2015 21:18   Dg Esophagus  12/25/2015  CLINICAL DATA:  Dysphagia.  Nausea and vomiting. EXAM: ESOPHOGRAM/BARIUM SWALLOW TECHNIQUE: Single contrast examination was performed using  thin barium. FLUOROSCOPY TIME:  Radiation Exposure Index (as provided by the fluoroscopic device): If the device does not provide the exposure index: Fluoroscopy Time:  1 minutes 54 seconds Number of Acquired Images: COMPARISON:  None. FINDINGS: The patient was very short of breath and had difficulty swallowing barium. There is an irregular narrowing of the distal esophagus. The esophagus is dilated above the narrowing. Barium did pass through the area of narrowing which shows mucosal irregularity and is worrisome for carcinoma of the distal esophagus. This appears be partially obstructing. Limited evaluation of the remainder of the  esophagus. Endoscopy recommended. IMPRESSION: Irregular mass distal esophagus consistent with carcinoma of the esophagus. This is partially obstructing. Endoscopy and biopsy recommended. Electronically Signed   By: Franchot Gallo M.D.   On: 12/25/2015 14:54   Dg Chest Port 1 View  12/26/2015  CLINICAL DATA:  82 year old male with shortness of Breath, dysphagia, evidence of esophageal tumor on recent esophagram. Initial encounter. EXAM: PORTABLE CHEST 1 VIEW COMPARISON:  Chest radiographs 09/21/2015.  Esophagram 12/25/2015. FINDINGS:  Portable AP semi upright view at 0842 hours. Small a moderate left pleural effusion. Trace right pleural effusion. Mediastinal contours are stable since November. Visualized tracheal air column is within normal limits. No pneumothorax or pulmonary edema. Aside from the dense opacification of the left lung base, no confluent pulmonary opacity. IMPRESSION: Small to moderate left pleural effusion with left lung base collapse or consolidation. Electronically Signed   By: Genevie Ann M.D.   On: 12/26/2015 08:52    Microbiology: Recent Results (from the past 240 hour(s))  MRSA PCR Screening     Status: None   Collection Time: 12/25/15  8:53 AM  Result Value Ref Range Status   MRSA by PCR NEGATIVE NEGATIVE Final    Comment:        The GeneXpert MRSA Assay (FDA approved for NASAL specimens only), is one component of a comprehensive MRSA colonization surveillance program. It is not intended to diagnose MRSA infection nor to guide or monitor treatment for MRSA infections.      Labs: Basic Metabolic Panel:  Recent Labs Lab 12/27/15 0300 12/28/15 0540 12/29/15 0558 12/30/15 0957 12/31/15 0333  NA 140 138 140 137 139  K 3.6 3.4* 3.7 3.7 3.8  CL 100* 98* 97* 94* 96*  CO2 29 34* 34* 34* 36*  GLUCOSE 109* 99 111* 177* 119*  BUN 8 6 8 7 8   CREATININE 0.85 0.96 0.81 0.97 0.97  CALCIUM 8.5* 8.7* 8.7* 8.8* 8.8*   Liver Function Tests: No results for input(s): AST,  ALT, ALKPHOS, BILITOT, PROT, ALBUMIN in the last 168 hours. No results for input(s): LIPASE, AMYLASE in the last 168 hours. No results for input(s): AMMONIA in the last 168 hours. CBC:  Recent Labs Lab 12/25/15 1419 12/25/15 1954 12/26/15 0348 12/27/15 0300  WBC  --   --   --  8.1  HGB 12.9* 12.1* 11.9*  12.1* 11.4*  HCT 38.7* 37.3* 36.4*  36.4* 34.5*  MCV  --   --   --  99.7  PLT  --   --   --  155   Cardiac Enzymes: No results for input(s): CKTOTAL, CKMB, CKMBINDEX, TROPONINI in the last 168 hours. BNP: BNP (last 3 results)  Recent Labs  09/21/15 1108  BNP 114.0*    ProBNP (last 3 results) No results for input(s): PROBNP in the last 8760 hours.  CBG: No results for input(s): GLUCAP in the last 168 hours.     SignedFlorencia Reasons MD, PhD  [facility] 01/01/2016, 7:52 AM

## 2016-01-01 NOTE — Progress Notes (Signed)
Volunteer Services escorted pt to the main entrance via wheelchair accompanied by his neighbors. Dorita Fray 01/01/2016 2:22 PM

## 2016-01-01 NOTE — Progress Notes (Signed)
Nsg Discharge Note  Admit Date:  12/24/2015 Discharge date: 01/01/2016   Sean Figueroa to be D/C'd home per MD order.  AVS completed.  Copy for chart, and copy for patient signed, and dated. Patient/caregiver able to verbalize understanding.  Discharge Medication:   Medication List    STOP taking these medications        clopidogrel 75 MG tablet  Commonly known as:  PLAVIX     irbesartan 75 MG tablet  Commonly known as:  AVAPRO     isosorbide mononitrate 60 MG 24 hr tablet  Commonly known as:  IMDUR     OXYGEN     rosuvastatin 10 MG tablet  Commonly known as:  CRESTOR      TAKE these medications        allopurinol 100 MG tablet  Commonly known as:  ZYLOPRIM  Take 100 mg by mouth daily.     ALPRAZolam 0.25 MG tablet  Commonly known as:  XANAX  Take 1 tablet (0.25 mg total) by mouth 2 (two) times daily as needed for anxiety.     amoxicillin-clavulanate 875-125 MG tablet  Commonly known as:  AUGMENTIN  Take 1 tablet by mouth 2 (two) times daily.     aspirin EC 81 MG tablet  Take 81 mg by mouth daily.     CALCIUM PO  Take 1 tablet by mouth daily.     [employer] D3 1000 units capsule  Generic drug:  Cholecalciferol  Take 1,000 Units by mouth daily.     feeding supplement (ENSURE ENLIVE) Liqd  Take 237 mLs by mouth 2 (two) times daily between meals.     fish oil-omega-3 fatty acids 1000 MG capsule  Take 1 g by mouth 2 (two) times daily.     furosemide 20 MG tablet  Commonly known as:  LASIX  Take 1 tablet (20 mg total) by mouth every Monday, Wednesday, and Friday.     guaiFENesin 600 MG 12 hr tablet  Commonly known as:  MUCINEX  Take 1,200 mg by mouth 2 (two) times daily as needed for cough or to loosen phlegm.     ipratropium 0.02 % nebulizer solution  Commonly known as:  ATROVENT  Take 0.5 mg by nebulization 4 (four) times daily.     meclizine 25 MG tablet  Commonly known as:  ANTIVERT  Take 1 tablet (25 mg total) by mouth 3 (three) times daily as needed for  dizziness. Additional refills from PCP     metoprolol tartrate 25 MG tablet  Commonly known as:  LOPRESSOR  Take 0.5 tablets (12.5 mg total) by mouth 2 (two) times daily.     NITROSTAT 0.4 MG SL tablet  Generic drug:  nitroGLYCERIN  PLACE 1 TABLET (0.4 MG TOTAL) UNDER THE TONGUE EVERY 5 (FIVE) MINUTES AS NEEDED.     pantoprazole 40 MG tablet  Commonly known as:  PROTONIX  TAKE 1 TABLET BY MOUTH DAILY     potassium chloride SA 20 MEQ tablet  Commonly known as:  KLOR-CON M20  Take 1 tablet (20 mEq total) by mouth daily.     pramipexole 0.125 MG tablet  Commonly known as:  MIRAPEX  Take 0.125 mg by mouth at bedtime.     PROAIR HFA 108 (90 Base) MCG/ACT inhaler  Generic drug:  albuterol  INHALE 2 PUFFS INTO LUNGS EVERY 6 HOURS AS NEEDED FOR WHEEZING     ranitidine 150 MG capsule  Commonly known as:  ZANTAC  Take 150 mg  by mouth at bedtime.     temazepam 30 MG capsule  Commonly known as:  RESTORIL  TAKE ONE CAPSULE BY MOUTH AT BEDTIME AS NEEDED     traMADol 50 MG tablet  Commonly known as:  ULTRAM  Take 50 mg by mouth every 6 (six) hours as needed for moderate pain.        Discharge Assessment: Filed Vitals:   12/31/15 2142 01/01/16 0608  BP: 133/68 137/62  Pulse: 73 41  Temp: 98.5 F (36.9 C) 97.9 F (36.6 C)  Resp: 18 20   Skin clean, dry and intact without evidence of skin break down, no evidence of skin tears noted. IV catheter discontinued with catheter tip intact. Site without signs and symptoms of complications - no redness or edema noted at insertion site, patient denies c/o pain - only slight tenderness at site.  Dressing with slight pressure applied.  D/c Instructions-Education: Discharge instructions given to patient/family with verbalized understanding. D/c education completed with patient/family including follow up instructions, medication list, d/c activities limitations if indicated, with other d/c instructions as indicated by MD - patient able to  verbalize understanding, all questions fully answered. Patient instructed to return to ED, call 911, or call MD for any changes in condition.  Patient will call his ride once case manager to give the okay to leave at this time. Pt informed to call RN once he is ready for wheelchair to take him to the main entrance.   Dorita Fray, RN 01/01/2016 12:53 PM

## 2016-01-01 NOTE — Telephone Encounter (Signed)
Called [facility] again, Sean Figueroa and provided his RT appointment for 2/15. He states that Sean Figueroa wants to be home a week before he gets out of the house again. He wants to stay home for a week at least. Forwarded message to Dr. Alvy Bimler and scheduler for Dr. Lisbeth Renshaw.

## 2016-01-01 NOTE — Progress Notes (Signed)
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 Edison Nasuti available if EUS is needed, no plans for gastric feeding tube at present  [name]  Past/Anticipated interventions by medical oncology, if any: Dr. Alvy Bimler : 12/29/2015: Referral for palliative radiation ,nutritional consult,   Weight changes, if any: weight loss yes 4 lbs  Last week   Bowel/Bladder complaints, if any: regular bowels, frequency   Only bladder  Nausea / Vomiting, if any: None  Pain issues, if any: mid chest   Pain when eating or drinking fluids ,    Any blood per rectum:  NO  SAFETY ISSUES: D/C hospital 01/01/16, yes  Prior radiation? NO  Pacemaker/ICD? NO  Possible current pregnancy? N/A  Is the patient on methotrexate? NO  Current Complaints/Details: HOH, HX MI, 1981,left ventricular apical aneurysm,, MI 2/26/ 2013, with  Coronary angioplasty  With stent; cardiac catheterization,  Percutaneous coronary stent  01/20/12; Pneumonia numerous times, chronic angina, a-fib, COPD,Home Oxygen,Current Smoker 0.75ppd cigarettes for 65 years ; Son MPOA

## 2016-01-02 ENCOUNTER — Telehealth: Payer: Self-pay | Admitting: *Deleted

## 2016-01-02 NOTE — Telephone Encounter (Signed)
XXXX 

## 2016-01-03 ENCOUNTER — Ambulatory Visit: Payer: Medicare Other | Admitting: Radiation Oncology

## 2016-01-10 ENCOUNTER — Ambulatory Visit: Payer: Medicare Other | Admitting: Internal Medicine

## 2016-01-10 ENCOUNTER — Encounter: Payer: Self-pay | Admitting: Radiation Oncology

## 2016-01-10 ENCOUNTER — Ambulatory Visit
Admit: 2016-01-10 | Discharge: 2016-01-10 | Disposition: A | Discharge status: Home / Self Care | Attending: Radiation Oncology | Admitting: Radiation Oncology

## 2016-01-10 VITALS — BP 135/53 | HR 63 | Temp 97.6°F | Resp 22 | Ht 68.0 in | Wt 151.4 lb

## 2016-01-10 DIAGNOSIS — I2510 Atherosclerotic heart disease of native coronary artery without angina pectoris: Secondary | ICD-10-CM | POA: Diagnosis not present

## 2016-01-10 DIAGNOSIS — I4891 Unspecified atrial fibrillation: Secondary | ICD-10-CM | POA: Insufficient documentation

## 2016-01-10 DIAGNOSIS — I110 Hypertensive heart disease with heart failure: Secondary | ICD-10-CM | POA: Insufficient documentation

## 2016-01-10 DIAGNOSIS — F17210 Nicotine dependence, cigarettes, uncomplicated: Secondary | ICD-10-CM | POA: Insufficient documentation

## 2016-01-10 DIAGNOSIS — I509 Heart failure, unspecified: Secondary | ICD-10-CM | POA: Diagnosis not present

## 2016-01-10 DIAGNOSIS — J449 Chronic obstructive pulmonary disease, unspecified: Secondary | ICD-10-CM | POA: Diagnosis not present

## 2016-01-10 DIAGNOSIS — Z7982 Long term (current) use of aspirin: Secondary | ICD-10-CM | POA: Diagnosis not present

## 2016-01-10 DIAGNOSIS — F1010 Alcohol abuse, uncomplicated: Secondary | ICD-10-CM | POA: Diagnosis not present

## 2016-01-10 DIAGNOSIS — Z9981 Dependence on supplemental oxygen: Secondary | ICD-10-CM | POA: Insufficient documentation

## 2016-01-10 DIAGNOSIS — Z79899 Other long term (current) drug therapy: Secondary | ICD-10-CM | POA: Diagnosis not present

## 2016-01-10 DIAGNOSIS — E785 Hyperlipidemia, unspecified: Secondary | ICD-10-CM | POA: Diagnosis present

## 2016-01-10 DIAGNOSIS — I252 Old myocardial infarction: Secondary | ICD-10-CM | POA: Diagnosis not present

## 2016-01-10 DIAGNOSIS — C155 Malignant neoplasm of lower third of esophagus: Secondary | ICD-10-CM | POA: Insufficient documentation

## 2016-01-10 DIAGNOSIS — Z510 Encounter for antineoplastic radiation therapy: Secondary | ICD-10-CM | POA: Insufficient documentation

## 2016-01-10 NOTE — Progress Notes (Signed)
Please see the Nurse Progress Note in the MD Initial Consult Encounter for this patient. 

## 2016-01-10 NOTE — Progress Notes (Addendum)
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 Edison Nasuti available if EUS is needed, no plans for gastric feeding tube at present  [name]  Past/Anticipated interventions by medical oncology, if any: Dr. Alvy Bimler : 12/29/2015: Referral for palliative radiation ,nutritional consult,   Weight changes, if any: weight loss yes 4 lbs  Last week   Bowel/Bladder complaints, if any: regular bowels, frequency   Only bladder  Nausea / Vomiting, if any: None  Pain issues, if any: mid chest   Pain when eating or drinking fluids ,    Any blood per rectum:  NO  SAFETY ISSUES: D/C hospital 01/01/16, yes  Prior radiation? NO  Pacemaker/ICD? NO  Possible current pregnancy? N/A  Is the patient on methotrexate? NO  Current Complaints/Details: HOH, HX MI, 1981,left ventricular apical aneurysm,, MI 2/26/ 2013, with  Coronary angioplasty  With stent; cardiac catheterization,  Percutaneous coronary stent  01/20/12; Pneumonia numerous times, chronic angina, a-fib, COPD,Home Oxygen,Current Smoker 0.75ppd cigarettes for 65 years ;smokes 2-3 cigarettes daily now, coughs up white sputum, sob  Son MPOA BP 135/53 mmHg  Pulse 63  Temp(Src) 97.6 F (36.4 C) (Oral)  Resp 22  Ht 5\' 8"  (1.727 m)  Wt 151 lb 6.4 oz (68.675 kg)  BMI 23.03 kg/m2  SpO2 100%  Wt Readings from Last 3 Encounters:  01/10/16 151 lb 6.4 oz (68.675 kg)  12/27/15 155 lb (70.308 kg)  12/13/15 160 lb (72.576 kg)

## 2016-01-10 NOTE — Progress Notes (Signed)
Radiation Oncology         [phone] ________________________________  Name: Sean Figueroa MRN: [id number]  Date: 01/10/2016  DOB: Apr 04, 1933  [id number], MD  [name], MD     REFERRING PHYSICIAN: [name], MD   DIAGNOSIS: The encounter diagnosis was Malignant neoplasm of lower third of esophagus ([place]).   HISTORY OF PRESENT ILLNESS:Sean Figueroa is an 82 y.o. male who is seen for an initial consultation visit for a newly diagnosed adenocarcinoma of the lower esophagus. The patient had been experiencing dysphasia and presented to the emergency department on 12/24/2015 with melena, and nausea with vomiting. He underwent a barium swallow on 12/25/2015 revealing an irregular narrowing of the distal esophagus with dilatation above the narrowing concerning for a carcinoma of the distal esophagus and was partially obstructing. He underwent a endoscopy with biopsy with biopsy revealing adenocarcinoma. Staging CT of the chest abdomen and pelvis was then performed the same day revealing chronic lingular opacity and collapse consistent with small left effusion mildly increased from 2014 with left lower lobe atelectasis. Mild subpleural scarring in the anterior left upper lobe was noted. Underlying emphysematous changes were also present and trace right pleural effusion. Small mediastinal lymph nodes including a 6 mm subcarinal node was present within normal limits. Within the abdomen and pelvis the polypoid mass along the anterior aspect of the distal esophagus above the GE junction was present, no measurements were given. No evidence of any metastatic disease is present in the remainder of the scan. He comes today for further discussion of the role of palliative radiotherapy as he has not been felt to be a good candidate for chemotherapy and has declined interest in pursuing this. He is currently under hospice care, with [facility] hospice.   PREVIOUS RADIATION THERAPY: No   PAST MEDICAL  HISTORY:  has a past medical history of Coronary artery disease; Hyperlipidemia; Hypertension; Long-term (current) use of anticoagulants; LVH (left ventricular hypertrophy); Atrial fibrillation ([place]); Tobacco abuse; Systolic heart failure; Left ventricular apical thrombus ([place]); On home oxygen therapy; COPD (chronic obstructive pulmonary disease) ([place]); Pneumonia ("several times"); Arthritis; History of gout; Chronic back pain; Nocturia; Syncopal episodes; Allergy; CHF (congestive heart failure) ([place]); and Myocardial infarction ([place]) (1981).     PAST SURGICAL HISTORY: Past Surgical History  Procedure Laterality Date  . Cholecystectomy    . Hernia repair    . Coronary angioplasty with stent placement  12/2011    LCX and L main - drug eluting  . Cardiac catheterization  1981    [name] 01/14/2012  . Left heart catheterization with coronary angiogram N/A 01/16/2012    Procedure: LEFT HEART CATHETERIZATION WITH CORONARY ANGIOGRAM;  Surgeon: Sherren Mocha, MD;  Location: [facility] CATH LAB;  Service: Cardiovascular;  Laterality: N/A;  . Percutaneous coronary stent intervention (pci-s) N/A 01/20/2012    Procedure: PERCUTANEOUS CORONARY STENT INTERVENTION (PCI-S);  Surgeon: Sherren Mocha, MD;  Location: [facility] CATH LAB;  Service: Cardiovascular;  Laterality: N/A;  . Esophagogastroduodenoscopy (egd) with propofol N/A 12/27/2015    Procedure: ESOPHAGOGASTRODUODENOSCOPY (EGD) WITH PROPOFOL;  Surgeon: Mauri Pole, MD;  Location: [facility] ENDOSCOPY;  Service: Endoscopy;  Laterality: N/A;     FAMILY HISTORY: family history includes Cerebral palsy in his daughter; Heart disease in his mother; Hypertension in his mother.   SOCIAL HISTORY:  reports that he has been smoking Cigarettes.  He has a 48.75 pack-year smoking history. He has never used smokeless tobacco. He reports that he drinks alcohol. He reports that he does not use  illicit drugs. he currently smokes 2-5 cigarettes per day. He is accompanied by his son who has  some cognitive delay, and his neighbor who is his healthcare power of attorney.   ALLERGIES: Indomethacin; Lipitor; Pravachol; Procardia; and Zaroxolyn   MEDICATIONS:  Current Outpatient Prescriptions  Medication Sig Dispense Refill  . allopurinol (ZYLOPRIM) 100 MG tablet Take 100 mg by mouth daily.     Marland Kitchen ALPRAZolam (XANAX) 0.25 MG tablet Take 1 tablet (0.25 mg total) by mouth 2 (two) times daily as needed for anxiety. 15 tablet 0  . amoxicillin-clavulanate (AUGMENTIN) 875-125 MG tablet Take 1 tablet by mouth 2 (two) times daily. 8 tablet 0  . aspirin EC 81 MG tablet Take 81 mg by mouth daily.    Marland Kitchen CALCIUM PO Take 1 tablet by mouth daily.    . [employer] D3 1000 UNITS capsule Take 1,000 Units by mouth daily.  5  . feeding supplement, ENSURE ENLIVE, (ENSURE ENLIVE) LIQD Take 237 mLs by mouth 2 (two) times daily between meals. 237 mL 12  . furosemide (LASIX) 20 MG tablet Take 1 tablet (20 mg total) by mouth every Monday, Wednesday, and Friday. 30 tablet 0  . guaiFENesin (MUCINEX) 600 MG 12 hr tablet Take 1,200 mg by mouth 2 (two) times daily as needed for cough or to loosen phlegm.     Marland Kitchen ipratropium (ATROVENT) 0.02 % nebulizer solution Take 0.5 mg by nebulization 4 (four) times daily.    . meclizine (ANTIVERT) 25 MG tablet Take 1 tablet (25 mg total) by mouth 3 (three) times daily as needed for dizziness. Additional refills from PCP 30 tablet 3  . metoprolol tartrate (LOPRESSOR) 25 MG tablet Take 0.5 tablets (12.5 mg total) by mouth 2 (two) times daily. 60 tablet 0  . pantoprazole (PROTONIX) 40 MG tablet TAKE 1 TABLET BY MOUTH DAILY 30 tablet 2  . potassium chloride SA (KLOR-CON M20) 20 MEQ tablet Take 1 tablet (20 mEq total) by mouth daily. 30 tablet 0  . pramipexole (MIRAPEX) 0.125 MG tablet Take 0.125 mg by mouth at bedtime.  1  . PROAIR HFA 108 (90 BASE) MCG/ACT inhaler INHALE 2 PUFFS INTO LUNGS EVERY 6 HOURS AS NEEDED FOR WHEEZING 8.5 each 5  . ranitidine (ZANTAC) 150 MG capsule Take 150 mg by  mouth at bedtime.  1  . temazepam (RESTORIL) 30 MG capsule TAKE ONE CAPSULE BY MOUTH AT BEDTIME AS NEEDED (Patient taking differently: TAKE ONE CAPSULE BY MOUTH AT BEDTIME AS NEEDED FOR SLEEP) 90 capsule 1  . traMADol (ULTRAM) 50 MG tablet Take 50 mg by mouth every 6 (six) hours as needed for moderate pain.     . isosorbide mononitrate (IMDUR) 60 MG 24 hr tablet TAKE 1 TABLET (60 MG TOTAL) BY MOUTH 2 (TWO) TIMES DAILY.  1  . NITROSTAT 0.4 MG SL tablet PLACE 1 TABLET (0.4 MG TOTAL) UNDER THE TONGUE EVERY 5 (FIVE) MINUTES AS NEEDED. (Patient not taking: Reported on 01/10/2016) 25 tablet 6   No current facility-administered medications for this encounter.     REVIEW OF SYSTEMS:  On review of systems, he describes occasional urinary frequency, shortness of breath with exertion, and coughing up white sputum. He denies nausea, vomiting, constipation, melena, hematochezia, or blood per rectum. He is experiencing pain in his chest just below the xiphoid, and describes this as a sharp pain when eating. He often regurgitates liquids and is unable to eat much solid food at this time. He has been using Ensure induced to maintain his  nutrition but has lost about 20 pounds per report in the last 6 months. He continues with 3 L of oxygen and has relied on this for many years continuously. He denies any external pressure or radiating chest pain . He denies fevers or chills. A complete review of systems is obtained and is otherwise negative.  PHYSICAL EXAM:  height is 5\' 8"  (1.727 m) and weight is 151 lb 6.4 oz (68.675 kg). His oral temperature is 97.6 F (36.4 C). His blood pressure is 135/53 and his pulse is 63. His respiration is 22 and oxygen saturation is 100%.    In general this is a chronically ill-appearing Caucasian male in no acute distress. He's alert and oriented 4 and appropriate throughout the examination. Cardiovascular exam reveals a regular rate and rhythm, no clicks rubs or murmurs are auscultated.  Chest is clear to auscultation in the left apex and mid field with decreased breath sounds at the base, and crackles are heard in all fields on the right, and decreased breath sounds are noted at the base on the right. No palpable adenopathy is noted of the cervical, supraclavicular, or axillary regions. The abdomen is intact with bowel sounds in all quadrants and is soft, nontender, nondistended. Lower extremities are negative for pretibial pitting edema deep calf tenderness. His skin is intact without visible ulcer or lesion.  ECOG = 1  0 - Asymptomatic (Fully active, able to carry on all predisease activities without restriction)  1 - Symptomatic but completely ambulatory (Restricted in physically strenuous activity but ambulatory and able to carry out work of a light or sedentary nature. For example, light housework, office work)  2 - Symptomatic, <50% in bed during the day (Ambulatory and capable of all self care but unable to carry out any work activities. Up and about more than 50% of waking hours)  3 - Symptomatic, >50% in bed, but not bedbound (Capable of only limited self-care, confined to bed or chair 50% or more of waking hours)  4 - Bedbound (Completely disabled. Cannot carry on any self-care. Totally confined to bed or chair)  5 - Death   Eustace Pen MM, Creech RH, Tormey DC, et al. [phone]). "Toxicity and response criteria of the [facility]". [place] Oncol. 5 (6): 649-55    LABORATORY DATA:  Lab Results  Component Value Date   WBC 8.1 12/27/2015   HGB 11.4* 12/27/2015   HCT 34.5* 12/27/2015   MCV 99.7 12/27/2015   PLT 155 12/27/2015   Lab Results  Component Value Date   NA 139 12/31/2015   K 3.8 12/31/2015   CL 96* 12/31/2015   CO2 36* 12/31/2015   Lab Results  Component Value Date   ALT 12* 12/24/2015   AST 16 12/24/2015   ALKPHOS 68 12/24/2015   BILITOT 1.0 12/24/2015      RADIOGRAPHY: Ct Chest W Contrast  12/27/2015  CLINICAL DATA:   Esophageal mass EXAM: CT CHEST, ABDOMEN, AND PELVIS WITH CONTRAST TECHNIQUE: Multidetector CT imaging of the chest, abdomen and pelvis was performed following the standard protocol during bolus administration of intravenous contrast. CONTRAST:  148mL OMNIPAQUE IOHEXOL 300 MG/ML  SOLN COMPARISON:  Esophagram dated 12/25/2015. FINDINGS: CT CHEST FINDINGS Mediastinum/Nodes: Heart is top-normal in size. Thinning at the left ventricular apex, suggesting prior myocardial infarction. Again noted is low density at the left ventricular apex which could reflect thrombus (series 2/ image 45). Three vessel coronary atherosclerosis. Atherosclerotic calcifications of the aortic arch. Small mediastinal lymph nodes,  including a 6 mm subcarinal node, within normal limits. Polypoid mass along the anterior aspect of the distal esophagus just above the GE junction (series 2/ image 42), corresponding to the biopsied lesion on endoscopy. Visualized thyroid is unremarkable. Lungs/Pleura: Chronic lingular opacity/collapse (series 3/ image 35). Small left pleural effusion, mildly increased from 2014, with associated left lower lobe atelectasis. Mild subpleural scarring in the anterior left upper lobe (series 3/ image 21). Additional mild scarring/atelectasis in the medial right middle lobe (series 3/image 46). Underlying mild emphysematous changes. Trace right pleural effusion. No pneumothorax. Musculoskeletal: Degenerative changes of the thoracic spine. CT ABDOMEN PELVIS FINDINGS Hepatobiliary: Unenhanced liver is unremarkable. Status post cholecystectomy. No intrahepatic or extrahepatic ductal dilatation. Pancreas: Pancreatic atrophy. Spleen: Within normal limits. Adrenals/Urinary Tract: 1.6 cm left adrenal adenoma (series 2/image 60), unchanged. 1.3 cm right adrenal adenoma (series 2/ image 69), unchanged. Kidneys are unremarkable. No renal, ureteral, or bladder calculi. Bladder is within normal limits. Stomach/Bowel: Stomach is notable  for a small hiatal hernia. No evidence of bowel obstruction. Extensive colonic diverticulosis, without evidence of diverticulitis. Mild concentric wall thickening along the rectum (series 2/ image 108), favored to reflect underdistention. Vascular/Lymphatic: Atherosclerotic calcifications of the abdominal aorta and branch vessels. Fusiform ectasia with saccular outpouching of the infrarenal abdominal aorta, measuring 3.1 x 2.3 cm (series 2/image 72), previously 3.0 x 2.5 cm in 2014. No suspicious abdominopelvic lymphadenopathy, although evaluation is mildly constrained due to lack of intravenous contrast administration. Reproductive: Prostate is notable for mild enlargement of the central gland which indents the base of the bladder. Other: No abdominopelvic ascites. Postsurgical changes related to prior left inguinal hernia repair. Musculoskeletal: Degenerative changes of the lumbar spine. IMPRESSION: Evaluation is constrained by lack of intravenous contrast administration. Polypoid mass along the anterior aspect of the distal esophagus above the GE junction. No findings specific for metastatic disease on unenhanced CT. Small left and trace right pleural effusions. Associated left lower lobe atelectasis. Chronic scarring/ atelectasis in the lingula. Bilateral adrenal adenomas. Additional ancillary findings as above. Electronically Signed   By: Julian Hy M.D.   On: 12/27/2015 21:18   Ct Abdomen Pelvis W Contrast  12/27/2015  CLINICAL DATA:  Esophageal mass EXAM: CT CHEST, ABDOMEN, AND PELVIS WITH CONTRAST TECHNIQUE: Multidetector CT imaging of the chest, abdomen and pelvis was performed following the standard protocol during bolus administration of intravenous contrast. CONTRAST:  155mL OMNIPAQUE IOHEXOL 300 MG/ML  SOLN COMPARISON:  Esophagram dated 12/25/2015. FINDINGS: CT CHEST FINDINGS Mediastinum/Nodes: Heart is top-normal in size. Thinning at the left ventricular apex, suggesting prior myocardial  infarction. Again noted is low density at the left ventricular apex which could reflect thrombus (series 2/ image 45). Three vessel coronary atherosclerosis. Atherosclerotic calcifications of the aortic arch. Small mediastinal lymph nodes, including a 6 mm subcarinal node, within normal limits. Polypoid mass along the anterior aspect of the distal esophagus just above the GE junction (series 2/ image 42), corresponding to the biopsied lesion on endoscopy. Visualized thyroid is unremarkable. Lungs/Pleura: Chronic lingular opacity/collapse (series 3/ image 35). Small left pleural effusion, mildly increased from 2014, with associated left lower lobe atelectasis. Mild subpleural scarring in the anterior left upper lobe (series 3/ image 21). Additional mild scarring/atelectasis in the medial right middle lobe (series 3/image 46). Underlying mild emphysematous changes. Trace right pleural effusion. No pneumothorax. Musculoskeletal: Degenerative changes of the thoracic spine. CT ABDOMEN PELVIS FINDINGS Hepatobiliary: Unenhanced liver is unremarkable. Status post cholecystectomy. No intrahepatic or extrahepatic ductal dilatation. Pancreas: Pancreatic atrophy. Spleen: Within  normal limits. Adrenals/Urinary Tract: 1.6 cm left adrenal adenoma (series 2/image 60), unchanged. 1.3 cm right adrenal adenoma (series 2/ image 69), unchanged. Kidneys are unremarkable. No renal, ureteral, or bladder calculi. Bladder is within normal limits. Stomach/Bowel: Stomach is notable for a small hiatal hernia. No evidence of bowel obstruction. Extensive colonic diverticulosis, without evidence of diverticulitis. Mild concentric wall thickening along the rectum (series 2/ image 108), favored to reflect underdistention. Vascular/Lymphatic: Atherosclerotic calcifications of the abdominal aorta and branch vessels. Fusiform ectasia with saccular outpouching of the infrarenal abdominal aorta, measuring 3.1 x 2.3 cm (series 2/image 72), previously 3.0  x 2.5 cm in 2014. No suspicious abdominopelvic lymphadenopathy, although evaluation is mildly constrained due to lack of intravenous contrast administration. Reproductive: Prostate is notable for mild enlargement of the central gland which indents the base of the bladder. Other: No abdominopelvic ascites. Postsurgical changes related to prior left inguinal hernia repair. Musculoskeletal: Degenerative changes of the lumbar spine. IMPRESSION: Evaluation is constrained by lack of intravenous contrast administration. Polypoid mass along the anterior aspect of the distal esophagus above the GE junction. No findings specific for metastatic disease on unenhanced CT. Small left and trace right pleural effusions. Associated left lower lobe atelectasis. Chronic scarring/ atelectasis in the lingula. Bilateral adrenal adenomas. Additional ancillary findings as above. Electronically Signed   By: Julian Hy M.D.   On: 12/27/2015 21:18   Dg Esophagus  12/25/2015  CLINICAL DATA:  Dysphagia.  Nausea and vomiting. EXAM: ESOPHOGRAM/BARIUM SWALLOW TECHNIQUE: Single contrast examination was performed using  thin barium. FLUOROSCOPY TIME:  Radiation Exposure Index (as provided by the fluoroscopic device): If the device does not provide the exposure index: Fluoroscopy Time:  1 minutes 54 seconds Number of Acquired Images: COMPARISON:  None. FINDINGS: The patient was very short of breath and had difficulty swallowing barium. There is an irregular narrowing of the distal esophagus. The esophagus is dilated above the narrowing. Barium did pass through the area of narrowing which shows mucosal irregularity and is worrisome for carcinoma of the distal esophagus. This appears be partially obstructing. Limited evaluation of the remainder of the esophagus. Endoscopy recommended. IMPRESSION: Irregular mass distal esophagus consistent with carcinoma of the esophagus. This is partially obstructing. Endoscopy and biopsy recommended.  Electronically Signed   By: Franchot Gallo M.D.   On: 12/25/2015 14:54   Dg Chest Port 1 View  12/26/2015  CLINICAL DATA:  82 year old male with shortness of Breath, dysphagia, evidence of esophageal tumor on recent esophagram. Initial encounter. EXAM: PORTABLE CHEST 1 VIEW COMPARISON:  Chest radiographs 09/21/2015.  Esophagram 12/25/2015. FINDINGS: Portable AP semi upright view at 0842 hours. Small a moderate left pleural effusion. Trace right pleural effusion. Mediastinal contours are stable since November. Visualized tracheal air column is within normal limits. No pneumothorax or pulmonary edema. Aside from the dense opacification of the left lung base, no confluent pulmonary opacity. IMPRESSION: Small to moderate left pleural effusion with left lung base collapse or consolidation. Electronically Signed   By: Genevie Ann M.D.   On: 12/26/2015 08:52       IMPRESSION: Sean Figueroa is an 82 y.o male with Malignant neoplasm of lower third of esophagus ([place]).   PLAN: The risks benefits and role of radiation were detailed, and the patient is interested moving forward a palliative manner. We will proceed with radiation treatment with 30Gy over 10 fractions. Planning CT simulation has been scheduled for Friday, 01/12/2016 at 10:00 AM. We will aim to begin radiation treatment on 01/15/2016. Dr. Lisbeth Renshaw discussed  the goal and potential side effects of treatment with the patient and he has signed consent to proceed with treatment. I have also discussed his case with his hospice providers and palliative radiotherapy is covered by his provider.   The above documentation reflects my direct findings during this shared patient visit. Please see the separate note by Dr. Lisbeth Renshaw on this date for the remainder of the patient's plan of care.  Carola Rhine, PAC  This document serves as a record of services personally performed by Shona Simpson, PA and Kyung Rudd, MD. It was created on their behalf by Jenell Milliner, a  trained medical scribe. The creation of this record is based on the scribe's personal observations and the provider's statements to them. This document has been checked and approved by the attending provider.

## 2016-01-12 ENCOUNTER — Ambulatory Visit
Admission: RE | Admit: 2016-01-12 | Discharge: 2016-01-12 | Disposition: A | Discharge status: Home / Self Care | Source: Ambulatory Visit | Attending: Radiation Oncology | Admitting: Radiation Oncology

## 2016-01-12 ENCOUNTER — Other Ambulatory Visit: Payer: Self-pay | Admitting: Cardiology

## 2016-01-12 DIAGNOSIS — Z510 Encounter for antineoplastic radiation therapy: Secondary | ICD-10-CM | POA: Diagnosis not present

## 2016-01-12 DIAGNOSIS — C155 Malignant neoplasm of lower third of esophagus: Secondary | ICD-10-CM

## 2016-01-12 MED ORDER — HYDROCODONE-ACETAMINOPHEN 5-325 MG PO TABS: 1.0000 | ORAL_TABLET | Freq: Four times a day (QID) | ORAL | Status: DC | PRN

## 2016-01-12 NOTE — Progress Notes (Signed)
Patient was brought to nursing with son and family male friend, he is requesting stronger pain med than tram adl, he is taking 50mg  oral every 3 hours, but rx reads every 6 hours, printed med list and paged lison Perkins, PA-C to see the patient, he only had port today

## 2016-01-12 NOTE — Progress Notes (Signed)
  Radiation Oncology         [phone] ________________________________  Name: Sean Figueroa MRN: [id number]  Date: 01/12/2016  DOB: 1933/07/12  SIMULATION AND TREATMENT PLANNING NOTE  DIAGNOSIS:     ICD-9-CM ICD-10-CM   1. Malignant neoplasm of lower third of esophagus ([facility]) 150.5 C15.5      Site:  Distal esophagus  NARRATIVE:  The patient was brought to the [facility].  Identity was confirmed.  All relevant records and images related to the planned course of therapy were reviewed.   Written consent to proceed with treatment was confirmed which was freely given after reviewing the details related to the planned course of therapy had been reviewed with the patient.  Then, the patient was set-up in a stable reproducible  supine position for radiation therapy.  CT images were obtained.  Surface markings were placed.    Medically necessary complex treatment device(s) for immobilization:  Customized Accuform device.   The CT images were loaded into the planning software.  Then the target and avoidance structures were contoured.  Treatment planning then occurred.  The radiation prescription was entered and confirmed.  A total of 4 complex treatment devices were fabricated which relate to the designed radiation treatment fields. Each of these customized fields/ complex treatment devices will be used on a daily basis during the radiation course. I have requested : 3D Simulation  I have requested a DVH of the following structures: Target volume, spinal cord, lungs.   The patient will undergo daily image guidance to ensure accurate localization of the target, and adequate minimize dose to the normal surrounding structures in close proximity to the target.   PLAN:  The patient will receive 30 Gy in 10 fractions.  ________________________________   Jodelle Gross, MD, PhD

## 2016-01-12 NOTE — Progress Notes (Signed)
The patient was seen a for port film. He is very enhancing increasing discomfort in the upper abdomen and into the mid chest. He states that his swallowing is becoming more painful. He's been using tramadol with some relief but states that he is concerned that this is not going to be as beneficial into the weekend. He requests a stronger medication. In general he is alert and oriented. He is in no acute distress. We discussed the use of Norco 5/325 and I provided a prescription for #60 no refills. He can take 1-2 tablets every 4-6 hours as needed for pain. He does understand that he should discontinue his tramadol. He is counseled to have this disposed of by pharmacy when he goes to pick up his new prescription. He was encouraged to call if he has any questions or concerns about medication but we did review the side effect profile of this.  Carola Rhine, [facility]

## 2016-01-15 ENCOUNTER — Other Ambulatory Visit: Payer: Self-pay | Admitting: Cardiology

## 2016-01-15 ENCOUNTER — Ambulatory Visit
Admission: RE | Admit: 2016-01-15 | Discharge: 2016-01-15 | Disposition: A | Discharge status: Home / Self Care | Source: Ambulatory Visit | Attending: Radiation Oncology | Admitting: Radiation Oncology

## 2016-01-15 DIAGNOSIS — Z510 Encounter for antineoplastic radiation therapy: Secondary | ICD-10-CM | POA: Diagnosis not present

## 2016-01-16 ENCOUNTER — Ambulatory Visit
Admission: RE | Admit: 2016-01-16 | Discharge: 2016-01-16 | Disposition: A | Discharge status: Home / Self Care | Source: Ambulatory Visit | Attending: Radiation Oncology | Admitting: Radiation Oncology

## 2016-01-16 DIAGNOSIS — Z510 Encounter for antineoplastic radiation therapy: Secondary | ICD-10-CM | POA: Diagnosis not present

## 2016-01-17 ENCOUNTER — Ambulatory Visit
Admission: RE | Admit: 2016-01-17 | Discharge: 2016-01-17 | Disposition: A | Discharge status: Home / Self Care | Source: Ambulatory Visit | Attending: Radiation Oncology | Admitting: Radiation Oncology

## 2016-01-17 DIAGNOSIS — Z510 Encounter for antineoplastic radiation therapy: Secondary | ICD-10-CM | POA: Diagnosis not present

## 2016-01-18 ENCOUNTER — Ambulatory Visit
Admission: RE | Admit: 2016-01-18 | Discharge: 2016-01-18 | Disposition: A | Discharge status: Home / Self Care | Source: Ambulatory Visit | Attending: Radiation Oncology | Admitting: Radiation Oncology

## 2016-01-18 DIAGNOSIS — Z510 Encounter for antineoplastic radiation therapy: Secondary | ICD-10-CM | POA: Diagnosis not present

## 2016-01-19 ENCOUNTER — Ambulatory Visit
Admission: RE | Admit: 2016-01-19 | Discharge: 2016-01-19 | Disposition: A | Discharge status: Home / Self Care | Payer: Medicare Other | Source: Ambulatory Visit | Attending: Radiation Oncology | Admitting: Radiation Oncology

## 2016-01-19 ENCOUNTER — Ambulatory Visit
Admission: RE | Admit: 2016-01-19 | Discharge: 2016-01-19 | Disposition: A | Discharge status: Home / Self Care | Source: Ambulatory Visit | Attending: Radiation Oncology | Admitting: Radiation Oncology

## 2016-01-19 ENCOUNTER — Encounter: Payer: Self-pay | Admitting: Radiation Oncology

## 2016-01-19 VITALS — BP 146/69 | HR 60 | Temp 97.6°F | Resp 20 | Wt 147.9 lb

## 2016-01-19 DIAGNOSIS — Z510 Encounter for antineoplastic radiation therapy: Secondary | ICD-10-CM | POA: Diagnosis not present

## 2016-01-19 DIAGNOSIS — C155 Malignant neoplasm of lower third of esophagus: Secondary | ICD-10-CM

## 2016-01-19 NOTE — Progress Notes (Signed)
Weekly rad tx 5/10 completed, coughing up white thick phlegm,  Drinks boost, orange drinks, water, ensure,  Not eating solid foods, eats cream  Soups,. very weak, on 3 liters n/c, no pain, slight nausea this morning,  Resolved  Patient education done, sonafine cream , radiation therapy and you book, my business card, discussed ways to manage side effects, fatigue,skin iriatation, thorat swallowing difficulties,  Patient is hard of hearing though, will re emphiasize  As needed weekly 11:39 AM

## 2016-01-19 NOTE — Progress Notes (Signed)
Department of Radiation Oncology  Phone:  [phone] Fax:        [phone]  Weekly Treatment Note    Name: Sean Figueroa Date: 01/19/2016 MRN: [id number] DOB: 03-01-1933   Diagnosis:     ICD-9-CM ICD-10-CM   1. Malignant neoplasm of lower third of esophagus (HCC) 150.5 C15.5      Current dose: 15 Gy  Current fraction: 5   MEDICATIONS: Current Outpatient Prescriptions  Medication Sig Dispense Refill  . allopurinol (ZYLOPRIM) 100 MG tablet Take 100 mg by mouth daily.     Marland Kitchen ALPRAZolam (XANAX) 0.25 MG tablet Take 1 tablet (0.25 mg total) by mouth 2 (two) times daily as needed for anxiety. 15 tablet 0  . amoxicillin-clavulanate (AUGMENTIN) 875-125 MG tablet Take 1 tablet by mouth 2 (two) times daily. 8 tablet 0  . aspirin EC 81 MG tablet Take 81 mg by mouth daily.    Marland Kitchen CALCIUM PO Take 1 tablet by mouth daily.    . CVS D3 1000 UNITS capsule Take 1,000 Units by mouth daily.  5  . feeding supplement, ENSURE ENLIVE, (ENSURE ENLIVE) LIQD Take 237 mLs by mouth 2 (two) times daily between meals. 237 mL 12  . furosemide (LASIX) 20 MG tablet Take 1 tablet (20 mg total) by mouth every Monday, Wednesday, and Friday. 30 tablet 0  . guaiFENesin (MUCINEX) 600 MG 12 hr tablet Take 1,200 mg by mouth 2 (two) times daily as needed for cough or to loosen phlegm.     Marland Kitchen HYDROcodone-acetaminophen (NORCO) 5-325 MG tablet Take 1-2 tablets by mouth every 6 (six) hours as needed for moderate pain. 60 tablet 0  . ipratropium (ATROVENT) 0.02 % nebulizer solution Take 0.5 mg by nebulization 4 (four) times daily.    . isosorbide mononitrate (IMDUR) 60 MG 24 hr tablet TAKE 1 TABLET (60 MG TOTAL) BY MOUTH 2 (TWO) TIMES DAILY.  1  . meclizine (ANTIVERT) 25 MG tablet Take 1 tablet (25 mg total) by mouth 3 (three) times daily as needed for dizziness. Additional refills from PCP 30 tablet 3  . metoprolol tartrate (LOPRESSOR) 25 MG tablet Take 0.5 tablets (12.5 mg total) by mouth 2 (two) times daily. 60 tablet 0   . metoprolol tartrate (LOPRESSOR) 25 MG tablet TAKE 1 TABLET (25 MG TOTAL) BY MOUTH 2 (TWO) TIMES DAILY. 30 tablet 10  . NITROSTAT 0.4 MG SL tablet PLACE 1 TABLET (0.4 MG TOTAL) UNDER THE TONGUE EVERY 5 (FIVE) MINUTES AS NEEDED. 25 tablet 6  . pantoprazole (PROTONIX) 40 MG tablet TAKE 1 TABLET BY MOUTH DAILY 30 tablet 2  . potassium chloride SA (KLOR-CON M20) 20 MEQ tablet Take 1 tablet (20 mEq total) by mouth daily. 30 tablet 0  . pramipexole (MIRAPEX) 0.125 MG tablet Take 0.125 mg by mouth at bedtime.  1  . PROAIR HFA 108 (90 BASE) MCG/ACT inhaler INHALE 2 PUFFS INTO LUNGS EVERY 6 HOURS AS NEEDED FOR WHEEZING 8.5 each 5  . ranitidine (ZANTAC) 150 MG capsule Take 150 mg by mouth at bedtime.  1  . temazepam (RESTORIL) 30 MG capsule TAKE ONE CAPSULE BY MOUTH AT BEDTIME AS NEEDED (Patient taking differently: TAKE ONE CAPSULE BY MOUTH AT BEDTIME AS NEEDED FOR SLEEP) 90 capsule 1  . irbesartan (AVAPRO) 75 MG tablet Take 75 mg by mouth daily.      No current facility-administered medications for this encounter.     ALLERGIES: Indomethacin; Lipitor; Pravachol; Procardia; and Zaroxolyn   LABORATORY DATA:  Lab Results  Component  Value Date   WBC 8.1 12/27/2015   HGB 11.4* 12/27/2015   HCT 34.5* 12/27/2015   MCV 99.7 12/27/2015   PLT 155 12/27/2015   Lab Results  Component Value Date   NA 139 12/31/2015   K 3.8 12/31/2015   CL 96* 12/31/2015   CO2 36* 12/31/2015   Lab Results  Component Value Date   ALT 12* 12/24/2015   AST 16 12/24/2015   ALKPHOS 68 12/24/2015   BILITOT 1.0 12/24/2015     NARRATIVE: Sean Figueroa was seen today for weekly treatment management. The chart was checked and the patient's films were reviewed.  Weekly rad tx 5/10 completed, coughing up white thick phlegm,  Drinks boost, orange drinks, water, ensure,  Not eating solid foods, eats cream  Soups,. very weak, on 3 liters n/c, no pain, slight nausea this morning,  Resolved  Patient education done, sonafine  cream , radiation therapy and you book, my business card, discussed ways to manage side effects, fatigue,skin iriatation, thorat swallowing difficulties,  Patient is hard of hearing though, will re emphiasize  As needed weekly 5:51 PM   PHYSICAL EXAMINATION: weight is 147 lb 14.4 oz (67.087 kg). His oral temperature is 97.6 F (36.4 C). His blood pressure is 146/69 and his pulse is 60. His respiration is 20 and oxygen saturation is 100%.        ASSESSMENT: The patient is doing satisfactorily with treatment.  PLAN: We will continue with the patient's radiation treatment as planned. The patient appears to be doing satisfactorily with treatment. He felt that his swallowing had possibly improved some over the last couple of days. We will continue to follow this in the next week.

## 2016-01-22 ENCOUNTER — Ambulatory Visit
Admission: RE | Admit: 2016-01-22 | Discharge: 2016-01-22 | Disposition: A | Discharge status: Home / Self Care | Source: Ambulatory Visit | Attending: Radiation Oncology | Admitting: Radiation Oncology

## 2016-01-22 DIAGNOSIS — Z510 Encounter for antineoplastic radiation therapy: Secondary | ICD-10-CM | POA: Diagnosis not present

## 2016-01-23 ENCOUNTER — Other Ambulatory Visit: Payer: Self-pay

## 2016-01-23 ENCOUNTER — Ambulatory Visit
Admission: RE | Admit: 2016-01-23 | Discharge: 2016-01-23 | Disposition: A | Discharge status: Home / Self Care | Source: Ambulatory Visit | Attending: Radiation Oncology | Admitting: Radiation Oncology

## 2016-01-23 DIAGNOSIS — Z510 Encounter for antineoplastic radiation therapy: Secondary | ICD-10-CM | POA: Diagnosis not present

## 2016-01-23 DIAGNOSIS — C159 Malignant neoplasm of esophagus, unspecified: Secondary | ICD-10-CM

## 2016-01-24 ENCOUNTER — Ambulatory Visit
Admission: RE | Admit: 2016-01-24 | Discharge: 2016-01-24 | Disposition: A | Discharge status: Home / Self Care | Payer: Medicare Other | Source: Ambulatory Visit | Attending: Radiation Oncology | Admitting: Radiation Oncology

## 2016-01-24 ENCOUNTER — Encounter: Payer: Self-pay | Admitting: Radiation Oncology

## 2016-01-24 ENCOUNTER — Ambulatory Visit
Admission: RE | Admit: 2016-01-24 | Discharge: 2016-01-24 | Disposition: A | Discharge status: Home / Self Care | Source: Ambulatory Visit | Attending: Radiation Oncology | Admitting: Radiation Oncology

## 2016-01-24 VITALS — BP 108/63 | HR 98 | Temp 97.8°F | Resp 20 | Wt 148.8 lb

## 2016-01-24 DIAGNOSIS — Z510 Encounter for antineoplastic radiation therapy: Secondary | ICD-10-CM | POA: Diagnosis not present

## 2016-01-24 DIAGNOSIS — C155 Malignant neoplasm of lower third of esophagus: Secondary | ICD-10-CM

## 2016-01-24 NOTE — Progress Notes (Signed)
Department of Radiation Oncology  Phone:  [phone] Fax:        [phone]  Weekly Treatment Note    Name: Sean Figueroa Date: 01/24/2016 MRN: [id number] DOB: September 10, 1933   Diagnosis:     ICD-9-CM ICD-10-CM   1. Malignant neoplasm of lower third of esophagus (HCC) 150.5 C15.5      Current dose: 24 Gy  Current fraction: 8   MEDICATIONS: Current Outpatient Prescriptions  Medication Sig Dispense Refill  . allopurinol (ZYLOPRIM) 100 MG tablet Take 100 mg by mouth daily.     Marland Kitchen ALPRAZolam (XANAX) 0.25 MG tablet Take 1 tablet (0.25 mg total) by mouth 2 (two) times daily as needed for anxiety. 15 tablet 0  . amoxicillin-clavulanate (AUGMENTIN) 875-125 MG tablet Take 1 tablet by mouth 2 (two) times daily. 8 tablet 0  . aspirin EC 81 MG tablet Take 81 mg by mouth daily.    Marland Kitchen CALCIUM PO Take 1 tablet by mouth daily.    . CVS D3 1000 UNITS capsule Take 1,000 Units by mouth daily.  5  . feeding supplement, ENSURE ENLIVE, (ENSURE ENLIVE) LIQD Take 237 mLs by mouth 2 (two) times daily between meals. 237 mL 12  . furosemide (LASIX) 20 MG tablet Take 1 tablet (20 mg total) by mouth every Monday, Wednesday, and Friday. 30 tablet 0  . guaiFENesin (MUCINEX) 600 MG 12 hr tablet Take 1,200 mg by mouth 2 (two) times daily as needed for cough or to loosen phlegm.     Marland Kitchen HYDROcodone-acetaminophen (NORCO) 5-325 MG tablet Take 1-2 tablets by mouth every 6 (six) hours as needed for moderate pain. 60 tablet 0  . ipratropium (ATROVENT) 0.02 % nebulizer solution Take 0.5 mg by nebulization 4 (four) times daily.    . irbesartan (AVAPRO) 75 MG tablet Take 75 mg by mouth daily.     . isosorbide mononitrate (IMDUR) 60 MG 24 hr tablet TAKE 1 TABLET (60 MG TOTAL) BY MOUTH 2 (TWO) TIMES DAILY.  1  . meclizine (ANTIVERT) 25 MG tablet Take 1 tablet (25 mg total) by mouth 3 (three) times daily as needed for dizziness. Additional refills from PCP 30 tablet 3  . metoprolol tartrate (LOPRESSOR) 25 MG tablet Take  0.5 tablets (12.5 mg total) by mouth 2 (two) times daily. 60 tablet 0  . metoprolol tartrate (LOPRESSOR) 25 MG tablet TAKE 1 TABLET (25 MG TOTAL) BY MOUTH 2 (TWO) TIMES DAILY. 30 tablet 10  . NITROSTAT 0.4 MG SL tablet PLACE 1 TABLET (0.4 MG TOTAL) UNDER THE TONGUE EVERY 5 (FIVE) MINUTES AS NEEDED. 25 tablet 6  . pantoprazole (PROTONIX) 40 MG tablet TAKE 1 TABLET BY MOUTH DAILY 30 tablet 2  . potassium chloride SA (KLOR-CON M20) 20 MEQ tablet Take 1 tablet (20 mEq total) by mouth daily. 30 tablet 0  . pramipexole (MIRAPEX) 0.125 MG tablet Take 0.125 mg by mouth at bedtime.  1  . PROAIR HFA 108 (90 BASE) MCG/ACT inhaler INHALE 2 PUFFS INTO LUNGS EVERY 6 HOURS AS NEEDED FOR WHEEZING 8.5 each 5  . ranitidine (ZANTAC) 150 MG capsule Take 150 mg by mouth at bedtime.  1  . temazepam (RESTORIL) 30 MG capsule TAKE ONE CAPSULE BY MOUTH AT BEDTIME AS NEEDED (Patient taking differently: TAKE ONE CAPSULE BY MOUTH AT BEDTIME AS NEEDED FOR SLEEP) 90 capsule 1   No current facility-administered medications for this encounter.     ALLERGIES: Indomethacin; Lipitor; Pravachol; Procardia; and Zaroxolyn   LABORATORY DATA:  Lab Results  Component  Value Date   WBC 8.1 12/27/2015   HGB 11.4* 12/27/2015   HCT 34.5* 12/27/2015   MCV 99.7 12/27/2015   PLT 155 12/27/2015   Lab Results  Component Value Date   NA 139 12/31/2015   K 3.8 12/31/2015   CL 96* 12/31/2015   CO2 36* 12/31/2015   Lab Results  Component Value Date   ALT 12* 12/24/2015   AST 16 12/24/2015   ALKPHOS 68 12/24/2015   BILITOT 1.0 12/24/2015     NARRATIVE: Sean Figueroa was seen today for weekly treatment management. The chart was checked and the patient's films were reviewed.  Weekly radiation treatment to the esophagus, 8/10 treatments completed. He mentions that he is eating better. He mentions that he coughs up yellow phlegm and has a congestive cough. He drinks ensure. He denies nausea. He are half an egg and biscuit. He  mentions that he feels weak. He takes 1/2 a pain pill at times for mild esophagea. He mentions that he is swallowing okay. He denies plain when swallowing. He drinks pepsi without difficulty. He states he is to have an endoscopy Monday. There are no orders in Epic.  10:43 AM   PHYSICAL EXAMINATION: weight is 148 lb 12.8 oz (67.495 kg). His oral temperature is 97.8 F (36.6 C). His blood pressure is 108/63 and his pulse is 98. His respiration is 20 and oxygen saturation is 100%.        ASSESSMENT: The patient is doing satisfactorily with treatment.  PLAN: We will continue with the patient's radiation treatment as planned. The patient appears to be doing satisfactorily with treatment. He will finish treatment 01/26/2016. He has a follow up appointment scheduled with me 02/29/2016. He has an endoscopy scheduled 01/29/2016. However, with the patient feeling better and finishing radiation in 2 days this may be able to be delayed as the patient wishes.    ------------------------------------------------  Jodelle Gross, MD, PhD    This document serves as a record of services personally performed by Kyung Rudd, MD. It was created on his behalf by  Lendon Collar, a trained medical scribe. The creation of this record is based on the scribe's personal observations and the provider's statements to them. This document has been checked and approved by the attending provider.

## 2016-01-24 NOTE — Progress Notes (Addendum)
Weekly rad txs esophagus 8/10 txs, eating some better stated, coughs up yellow phelgm,congestiveive cough, drinks ensure, no nausea, ate egg and biscuit 1/2 ,  Weak,  Takes 1/2 pain pilkl at times mild esophagela ,  Swallowing  Okay , drinks pepsi without difficulty, EGD Monday 01/29/16 [facility] endoscopy room 1 10:14 AM BP 108/63 mmHg  Pulse 98  Temp(Src) 97.8 F (36.6 C) (Oral)  Resp 20  Wt 148 lb 12.8 oz (67.495 kg)  SpO2 100%  Wt Readings from Last 3 Encounters:  01/24/16 148 lb 12.8 oz (67.495 kg)  01/19/16 147 lb 14.4 oz (67.087 kg)  01/10/16 151 lb 6.4 oz (68.675 kg)   10:13 AM'

## 2016-01-25 ENCOUNTER — Telehealth: Payer: Self-pay | Admitting: *Deleted

## 2016-01-25 ENCOUNTER — Ambulatory Visit
Admission: RE | Admit: 2016-01-25 | Discharge: 2016-01-25 | Disposition: A | Discharge status: Home / Self Care | Source: Ambulatory Visit | Attending: Radiation Oncology | Admitting: Radiation Oncology

## 2016-01-25 DIAGNOSIS — Z510 Encounter for antineoplastic radiation therapy: Secondary | ICD-10-CM | POA: Diagnosis not present

## 2016-01-25 NOTE — Telephone Encounter (Signed)
Called patient home ,spoke with the wife, patient was bathing, she didn't quite understand that his  Endoscopy can be cancelled on Monday the 13th, will speak with the patient when he comes for radiation today 8:54 AM

## 2016-01-26 ENCOUNTER — Encounter: Payer: Self-pay | Admitting: Radiation Oncology

## 2016-01-26 ENCOUNTER — Ambulatory Visit
Admission: RE | Admit: 2016-01-26 | Discharge: 2016-01-26 | Disposition: A | Discharge status: Home / Self Care | Source: Ambulatory Visit | Attending: Radiation Oncology | Admitting: Radiation Oncology

## 2016-01-26 ENCOUNTER — Telehealth: Payer: Self-pay | Admitting: *Deleted

## 2016-01-26 DIAGNOSIS — Z510 Encounter for antineoplastic radiation therapy: Secondary | ICD-10-CM | POA: Diagnosis not present

## 2016-01-26 NOTE — Telephone Encounter (Signed)
EGD Cancelled by Dr Heath Lark with Oncology    Dr Darleen Crocker

## 2016-01-26 NOTE — Telephone Encounter (Signed)
Sharee Pimple from Endoscopy called me and stated that patient had a note in his chart to cancel his EGD for Monday. I called patient and he stated oncology sent him a letter and said he did not need to proceed with his endoscopy stating that he is finished with radiology and swallowing is better . Will inform Sharee Pimple in Endoscopy and Dr Silverio Decamp

## 2016-01-27 NOTE — Telephone Encounter (Signed)
ok 

## 2016-01-29 ENCOUNTER — Ambulatory Visit (HOSPITAL_COMMUNITY): Admission: RE | Admit: 2016-01-29 | Payer: Medicare Other | Source: Ambulatory Visit | Admitting: Gastroenterology

## 2016-01-29 ENCOUNTER — Encounter: Payer: Self-pay | Admitting: *Deleted

## 2016-01-29 SURGERY — ESOPHAGOGASTRODUODENOSCOPY (EGD) WITH PROPOFOL
Anesthesia: Monitor Anesthesia Care

## 2016-01-29 NOTE — Progress Notes (Signed)
Sean Figueroa Psychosocial Distress Screening Clinical Social Work  Clinical Social Work was referred by distress screening protocol.  The patient scored a 6 on the Psychosocial Distress Thermometer which indicates moderate distress. Clinical Social Worker contacted patient by phone to assess for distress and other psychosocial needs.  Sean Figueroa reported he is not interested in treatment at this time and would like to have time to "let it be".  He shared he does not have the identified emotional and spiritual cases of distress at this time- he feels he is "doing much better" than from when he was in [facility].  The patient reported he is "working things out"- he plans to move to [facility] and is exploring housing options for his mentally disabled adult son.  Patient was appreciative of phone contact today and agreed to call CSW with any questions, concerns, or desire for additional support.  ONCBCN DISTRESS SCREENING 01/10/2016  Screening Type Initial Screening  Distress experienced in past week (1-10) 6  Practical problem type Housing;Insurance  Emotional problem type Depression;Adjusting to illness;Boredom  Spiritual/Religous concerns type Loss of sense of purpose  Physical Problem type Pain;Nausea/vomiting;Sleep/insomnia;Getting around;Bathing/dressing;Breathing;Mouth sores/swallowing;Constipation/diarrhea;Swollen arms/legs  Physician notified of physical symptoms Yes  Referral to clinical social work Yes    Sean Figueroa, MSW, LCSW, OSW-C Clinical Social Worker [facility] [phone]

## 2016-01-30 ENCOUNTER — Other Ambulatory Visit: Payer: Self-pay | Admitting: Cardiology

## 2016-02-05 ENCOUNTER — Other Ambulatory Visit: Payer: Self-pay | Admitting: Cardiology

## 2016-02-05 NOTE — Telephone Encounter (Signed)
Will need to come from primary care Kirk Ruths

## 2016-02-09 ENCOUNTER — Ambulatory Visit: Payer: Medicare Other | Admitting: Adult Health

## 2016-02-11 ENCOUNTER — Other Ambulatory Visit: Payer: Self-pay | Admitting: Cardiology

## 2016-02-12 NOTE — Telephone Encounter (Signed)
Needs refill by primary care Kirk Ruths

## 2016-02-15 ENCOUNTER — Ambulatory Visit: Payer: Medicare Other | Admitting: Internal Medicine

## 2016-02-26 ENCOUNTER — Encounter: Payer: Self-pay | Admitting: Radiation Oncology

## 2016-02-26 NOTE — Progress Notes (Signed)
°  Radiation Oncology         [phone] ________________________________  Name: Sean Figueroa MRN: [id number]  Date: 02/26/2016  DOB: January 24, 1933  End of Treatment Note  Diagnosis:   Malignant neoplasm of lower third of esophagus     Indication for treatment:  Palliative        Radiation treatment dates:   01/15/16 - 01/26/16  Site/dose:   Esophagus treated to 30 Gy in 10 fractions   Beams/energy:   Esophagus: 3D-Conformal/ 10X   Narrative: The patient tolerated radiation treatment relatively well.     Plan: The patient has completed radiation treatment. The patient will return to radiation oncology clinic for routine followup in one month. I advised them to call or return sooner if they have any questions or concerns related to their recovery or treatment.  ------------------------------------------------  Jodelle Gross, MD, PhD   This document serves as a record of services personally performed by Kyung Rudd, MD. It was created on his behalf by Derek Mound, a trained medical scribe. The creation of this record is based on the scribe's personal observations and the provider's statements to them. This document has been checked and approved by the attending provider.

## 2016-02-29 ENCOUNTER — Ambulatory Visit: Payer: Medicare Other | Admitting: Radiation Oncology

## 2016-03-01 ENCOUNTER — Ambulatory Visit: Payer: Medicare Other | Admitting: Radiation Oncology

## 2016-03-04 ENCOUNTER — Ambulatory Visit
Admission: RE | Admit: 2016-03-04 | Discharge: 2016-03-04 | Disposition: A | Discharge status: Home / Self Care | Payer: Medicare Other | Source: Ambulatory Visit | Attending: Radiation Oncology | Admitting: Radiation Oncology

## 2016-03-04 ENCOUNTER — Encounter: Payer: Self-pay | Admitting: Radiation Oncology

## 2016-03-04 VITALS — BP 131/66 | HR 68 | Temp 97.8°F

## 2016-03-04 DIAGNOSIS — C155 Malignant neoplasm of lower third of esophagus: Secondary | ICD-10-CM

## 2016-03-04 NOTE — Progress Notes (Signed)
Sean Figueroa reports that he is eating anything he wants without nay pain nor difficulty swallowing.  He is mostly concerned with "building his strength up".  Traveling by W/C.  Note bilateral edema of lower extremities with redness and being followed by home care nurse from "unknown agency".

## 2016-03-04 NOTE — Progress Notes (Signed)
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  Interval Since Last Radiation:  1 month  01/15/16-01/26/16: 30 Gy/ 10 fractions to the distal esophagus.   Narrative:  The patient returns today for routine follow-up.    On review of systems the patient reports he is doing very well in terms of eating, he is satisfied in terms of care is also receiving with hospice, and are coming out at least once a week. He has been experiencing lower extremity edema, and is currently on Lasix for this with specific imaging. He denies any shortness of breath or chest pain and continues with 2 L of oxygen via nasal cannula. No other complaints are verablized.  ALLERGIES:  is allergic to indomethacin; lipitor; pravachol; procardia; and zaroxolyn.  Meds: Current Outpatient Prescriptions  Medication Sig Dispense Refill  . allopurinol (ZYLOPRIM) 100 MG tablet Take 100 mg by mouth daily.     Marland Kitchen ALPRAZolam (XANAX) 0.25 MG tablet Take 1 tablet (0.25 mg total) by mouth 2 (two) times daily as needed for anxiety. 15 tablet 0  . aspirin EC 81 MG tablet Take 81 mg by mouth daily.    Marland Kitchen CALCIUM PO Take 1 tablet by mouth daily.    . CVS D3 1000 UNITS capsule Take 1,000 Units by mouth daily.  5  . feeding supplement, ENSURE ENLIVE, (ENSURE ENLIVE) LIQD Take 237 mLs by mouth 2 (two) times daily between meals. 237 mL 12  . guaiFENesin (MUCINEX) 600 MG 12 hr tablet Take 1,200 mg by mouth at bedtime.     Marland Kitchen HYDROcodone-acetaminophen (NORCO) 5-325 MG tablet Take 1-2 tablets by mouth every 6 (six) hours as needed for moderate pain. 60 tablet 0  . ipratropium-albuterol (DUONEB) 0.5-2.5 (3) MG/3ML SOLN Take 3 mLs by  nebulization 4 (four) times daily.    . isosorbide mononitrate (IMDUR) 60 MG 24 hr tablet TAKE 1 TABLET (60 MG TOTAL) BY MOUTH 2 (TWO) TIMES DAILY.  1  . meclizine (ANTIVERT) 25 MG tablet Take 1 tablet (25 mg total) by mouth 3 (three) times daily as needed for dizziness. Additional refills from PCP 30 tablet 3  . metoprolol tartrate (LOPRESSOR) 25 MG tablet Take 0.5 tablets (12.5 mg total) by mouth 2 (two) times daily. 60 tablet 0  . NITROSTAT 0.4 MG SL tablet PLACE 1 TABLET (0.4 MG TOTAL) UNDER THE TONGUE EVERY 5 (FIVE) MINUTES AS NEEDED. 25 tablet 6  . pantoprazole (PROTONIX) 40 MG tablet TAKE 1 TABLET BY MOUTH DAILY 30 tablet 2  . pramipexole (MIRAPEX) 0.125 MG tablet Take 0.125 mg by mouth at bedtime.  1  . PROAIR HFA 108 (90 BASE) MCG/ACT inhaler INHALE 2 PUFFS INTO LUNGS EVERY 6 HOURS AS NEEDED FOR WHEEZING 8.5 each 5  . ranitidine (ZANTAC) 150 MG capsule Take 150 mg by mouth at bedtime.  1  . rosuvastatin (CRESTOR) 10 MG tablet Take 10 mg by mouth daily.    . temazepam (RESTORIL) 30 MG capsule TAKE ONE CAPSULE BY MOUTH AT BEDTIME AS NEEDED (Patient taking differently: TAKE ONE CAPSULE BY MOUTH AT BEDTIME AS NEEDED FOR SLEEP) 90 capsule 1  . amoxicillin-clavulanate (AUGMENTIN) 875-125 MG tablet Take 1 tablet by mouth 2 (two)  times daily. (Patient not taking: Reported on 01/25/2016) 8 tablet 0  . furosemide (LASIX) 20 MG tablet TAKE 1 TABLET (20 MG TOTAL) BY MOUTH EVERY MONDAY, WEDNESDAY, AND FRIDAY. 30 tablet 1  . potassium chloride SA (KLOR-CON M20) 20 MEQ tablet Take 1 tablet (20 mEq total) by mouth daily. 30 tablet 0  . traMADol (ULTRAM) 50 MG tablet Take 50 mg by mouth every 6 (six) hours as needed for moderate pain.     No current facility-administered medications for this encounter.    Physical Findings:  temperature is 97.8 F (36.6 C). His blood pressure is 131/66 and his pulse is 68. His oxygen saturation is 100%.   Pain scale 0/10 In general this is a well appearing Caucasian  male in no acute distress. He's alert and oriented x4 and appropriate throughout the examination. Cardiopulmonary assessment is negative for acute distress and he exhibits normal effort. His skin is intact without evidence of desquamation or skin breakdown. No hyperpigmentation is appreciated over the anterior or posterior chest wall. Lower extremities reveal 3+ pitting edema and weeping of the skin.   Lab Findings: Lab Results  Component Value Date   WBC 8.1 12/27/2015   HGB 11.4* 12/27/2015   HCT 34.5* 12/27/2015   MCV 99.7 12/27/2015   PLT 155 12/27/2015     Radiographic Findings: No results found.  Impression/Plan: 1. Adenocarcinoma of the distal esophagus. The patient appears to be doing very well since radiotherapy completed, he is back to eating whatever he likes. He also continued under the care of hospice, and is not pursuing any additional neoadjuvant therapy due to his performance status. We will be available as needed if there are questions or concerns regarding his previous treatment, but her to hospice for the remainder of his goals of care. That being said, I will contact him by phone in the next couple of months to check in and see how he was doing, and if he is doing very well, and felt to be followed, we can coordinate an appointment at that time.  2. Lower extremity edema. This is most likely multifactorial with his protein calorie malnutrition, and possibly cardiopulmonary in nature as well. As it is goals of care for comfort palliation only, we will defer to hospice for further management of this. He is currently receiving Lasix diuresis. His home health hospice nurses also correlating for him to have extremity compression hose.     Carola Rhine, PAC

## 2016-03-12 ENCOUNTER — Telehealth: Payer: Self-pay | Admitting: *Deleted

## 2016-03-12 NOTE — Telephone Encounter (Signed)
I felt the same, thanks, [place]

## 2016-03-12 NOTE — Telephone Encounter (Signed)
Returned call and per our PA, he should get in contact with his hospice services for his leg pain, he stated ok thanks 12:06 PM

## 2016-03-12 NOTE — Telephone Encounter (Signed)
I would suggest he discuss pain medication with his hospice nurse. Thanks, Bryson Ha

## 2016-03-12 NOTE — Telephone Encounter (Signed)
Patient called and asking for refill on hydrocodone=acetaminophen 5/325mg , for his legs pain , he is with home hospice, his last rx from our PA, Shona Simpson was 01/12/16,   We treated his esophagus,  His last follow up with [place] was 03/04/16,  He seemed a little confused,. I told him I didn't think we would refill this pain med as his esophagus isn't hurting, that he may have to have home hospice address that or his Primary MD but would call him back either way with an answer,he will wait to hear back 10:55 AM

## 2016-04-11 ENCOUNTER — Institutional Professional Consult (permissible substitution): Payer: Medicare Other | Admitting: Cardiology

## 2016-04-30 ENCOUNTER — Telehealth: Payer: Self-pay | Admitting: Radiation Oncology

## 2016-04-30 NOTE — Telephone Encounter (Addendum)
I contacted the patient's neighbor Jenny Reichmann who is his primary caregiver and he stated that Sean Figueroa is pleased with his care with hospice, but he's worried about his son's care who has mental handicaps after his passing. I encouraged the neighbor to discuss this with Mr. Koker hospice nurse and social worker to help with coordinating his care.

## 2016-05-01 ENCOUNTER — Emergency Department (HOSPITAL_COMMUNITY): Payer: Medicare Other

## 2016-05-01 ENCOUNTER — Encounter (HOSPITAL_COMMUNITY): Payer: Self-pay

## 2016-05-01 ENCOUNTER — Inpatient Hospital Stay (HOSPITAL_COMMUNITY)
Admission: EM | Admit: 2016-05-01 | Discharge: 2016-05-06 | DRG: 189 | Disposition: A | Discharge status: Hospice | Payer: Medicare Other | Attending: Internal Medicine | Admitting: Internal Medicine

## 2016-05-01 DIAGNOSIS — R404 Transient alteration of awareness: Secondary | ICD-10-CM | POA: Diagnosis not present

## 2016-05-01 DIAGNOSIS — J9602 Acute respiratory failure with hypercapnia: Secondary | ICD-10-CM

## 2016-05-01 DIAGNOSIS — Z9981 Dependence on supplemental oxygen: Secondary | ICD-10-CM

## 2016-05-01 DIAGNOSIS — Z66 Do not resuscitate: Secondary | ICD-10-CM | POA: Diagnosis present

## 2016-05-01 DIAGNOSIS — I253 Aneurysm of heart: Secondary | ICD-10-CM | POA: Diagnosis present

## 2016-05-01 DIAGNOSIS — I4891 Unspecified atrial fibrillation: Secondary | ICD-10-CM | POA: Diagnosis present

## 2016-05-01 DIAGNOSIS — I110 Hypertensive heart disease with heart failure: Secondary | ICD-10-CM | POA: Diagnosis present

## 2016-05-01 DIAGNOSIS — I2510 Atherosclerotic heart disease of native coronary artery without angina pectoris: Secondary | ICD-10-CM | POA: Diagnosis present

## 2016-05-01 DIAGNOSIS — K219 Gastro-esophageal reflux disease without esophagitis: Secondary | ICD-10-CM | POA: Diagnosis present

## 2016-05-01 DIAGNOSIS — C155 Malignant neoplasm of lower third of esophagus: Secondary | ICD-10-CM | POA: Diagnosis present

## 2016-05-01 DIAGNOSIS — Z86718 Personal history of other venous thrombosis and embolism: Secondary | ICD-10-CM

## 2016-05-01 DIAGNOSIS — W19XXXA Unspecified fall, initial encounter: Secondary | ICD-10-CM | POA: Diagnosis present

## 2016-05-01 DIAGNOSIS — J9611 Chronic respiratory failure with hypoxia: Secondary | ICD-10-CM | POA: Diagnosis present

## 2016-05-01 DIAGNOSIS — Z7189 Other specified counseling: Secondary | ICD-10-CM | POA: Diagnosis present

## 2016-05-01 DIAGNOSIS — J441 Chronic obstructive pulmonary disease with (acute) exacerbation: Secondary | ICD-10-CM | POA: Diagnosis present

## 2016-05-01 DIAGNOSIS — G9341 Metabolic encephalopathy: Secondary | ICD-10-CM | POA: Diagnosis present

## 2016-05-01 DIAGNOSIS — E860 Dehydration: Secondary | ICD-10-CM | POA: Diagnosis present

## 2016-05-01 DIAGNOSIS — Z515 Encounter for palliative care: Secondary | ICD-10-CM | POA: Diagnosis not present

## 2016-05-01 DIAGNOSIS — R0602 Shortness of breath: Secondary | ICD-10-CM

## 2016-05-01 DIAGNOSIS — J9622 Acute and chronic respiratory failure with hypercapnia: Secondary | ICD-10-CM | POA: Diagnosis present

## 2016-05-01 DIAGNOSIS — Z7982 Long term (current) use of aspirin: Secondary | ICD-10-CM

## 2016-05-01 DIAGNOSIS — F17210 Nicotine dependence, cigarettes, uncomplicated: Secondary | ICD-10-CM | POA: Diagnosis present

## 2016-05-01 DIAGNOSIS — Z923 Personal history of irradiation: Secondary | ICD-10-CM | POA: Diagnosis not present

## 2016-05-01 DIAGNOSIS — C159 Malignant neoplasm of esophagus, unspecified: Secondary | ICD-10-CM

## 2016-05-01 DIAGNOSIS — I5022 Chronic systolic (congestive) heart failure: Secondary | ICD-10-CM | POA: Diagnosis present

## 2016-05-01 DIAGNOSIS — I480 Paroxysmal atrial fibrillation: Secondary | ICD-10-CM | POA: Diagnosis present

## 2016-05-01 DIAGNOSIS — Z7901 Long term (current) use of anticoagulants: Secondary | ICD-10-CM | POA: Diagnosis not present

## 2016-05-01 DIAGNOSIS — J449 Chronic obstructive pulmonary disease, unspecified: Secondary | ICD-10-CM | POA: Diagnosis not present

## 2016-05-01 DIAGNOSIS — R0600 Dyspnea, unspecified: Secondary | ICD-10-CM | POA: Diagnosis present

## 2016-05-01 DIAGNOSIS — J9621 Acute and chronic respiratory failure with hypoxia: Secondary | ICD-10-CM | POA: Diagnosis present

## 2016-05-01 DIAGNOSIS — Z955 Presence of coronary angioplasty implant and graft: Secondary | ICD-10-CM

## 2016-05-01 DIAGNOSIS — I252 Old myocardial infarction: Secondary | ICD-10-CM

## 2016-05-01 DIAGNOSIS — E785 Hyperlipidemia, unspecified: Secondary | ICD-10-CM | POA: Diagnosis present

## 2016-05-01 DIAGNOSIS — Z8701 Personal history of pneumonia (recurrent): Secondary | ICD-10-CM | POA: Diagnosis not present

## 2016-05-01 DIAGNOSIS — R4020 Unspecified coma: Secondary | ICD-10-CM

## 2016-05-01 LAB — CBC WITH DIFFERENTIAL/PLATELET
BASOS ABS: 0 10*3/uL (ref 0.0–0.1)
BASOS PCT: 0 %
EOS ABS: 0.1 10*3/uL (ref 0.0–0.7)
Eosinophils Relative: 1 %
HEMATOCRIT: 41.6 % (ref 39.0–52.0)
HEMOGLOBIN: 13.1 g/dL (ref 13.0–17.0)
Lymphocytes Relative: 9 %
Lymphs Abs: 1.4 10*3/uL (ref 0.7–4.0)
MCH: 34.2 pg — ABNORMAL HIGH (ref 26.0–34.0)
MCHC: 31.5 g/dL (ref 30.0–36.0)
MCV: 108.6 fL — ABNORMAL HIGH (ref 78.0–100.0)
Monocytes Absolute: 0.8 10*3/uL (ref 0.1–1.0)
Monocytes Relative: 5 %
NEUTROS ABS: 12.7 10*3/uL — AB (ref 1.7–7.7)
NEUTROS PCT: 85 %
Platelets: 165 10*3/uL (ref 150–400)
RBC: 3.83 MIL/uL — ABNORMAL LOW (ref 4.22–5.81)
RDW: 15.4 % (ref 11.5–15.5)
WBC: 15 10*3/uL — AB (ref 4.0–10.5)

## 2016-05-01 LAB — BLOOD GAS, ARTERIAL
Acid-Base Excess: 8.9 mmol/L — ABNORMAL HIGH (ref 0.0–2.0)
Bicarbonate: 39.8 meq/L — ABNORMAL HIGH (ref 20.0–24.0)
Delivery systems: POSITIVE
Drawn by: 276051
Drawn by: 331471
FIO2: 1
FIO2: 0.4
O2 Saturation: 99.2 %
O2 Saturation: 96 %
PATIENT TEMPERATURE: 98.6
PEEP: 5 cmH2O
PH ART: 7.132 — AB (ref 7.350–7.450)
Patient temperature: 98.6
Pressure control: 20 cmH2O
RATE: 15 {breaths}/min
TCO2: 37 mmol/L (ref 0–100)
pCO2 arterial: 96.1 mmHg (ref 35.0–45.0)
pH, Arterial: 7.24 — ABNORMAL LOW (ref 7.350–7.450)
pO2, Arterial: 234 mmHg — ABNORMAL HIGH (ref 80.0–100.0)
pO2, Arterial: 86.8 mmHg (ref 80.0–100.0)

## 2016-05-01 LAB — URINALYSIS, ROUTINE W REFLEX MICROSCOPIC
Bilirubin Urine: NEGATIVE
Glucose, UA: 100 mg/dL — AB
Hgb urine dipstick: NEGATIVE
Ketones, ur: NEGATIVE mg/dL
Leukocytes, UA: NEGATIVE
Nitrite: NEGATIVE
Protein, ur: NEGATIVE mg/dL
Specific Gravity, Urine: 1.023 (ref 1.005–1.030)
pH: 5 (ref 5.0–8.0)

## 2016-05-01 LAB — COMPREHENSIVE METABOLIC PANEL
ALBUMIN: 3.6 g/dL (ref 3.5–5.0)
ALK PHOS: 116 U/L (ref 38–126)
ALT: 63 U/L (ref 17–63)
ANION GAP: 3 — AB (ref 5–15)
AST: 30 U/L (ref 15–41)
BILIRUBIN TOTAL: 0.6 mg/dL (ref 0.3–1.2)
BUN: 30 mg/dL — ABNORMAL HIGH (ref 6–20)
CALCIUM: 8.8 mg/dL — AB (ref 8.9–10.3)
CO2: 42 mmol/L — ABNORMAL HIGH (ref 22–32)
CREATININE: 0.74 mg/dL (ref 0.61–1.24)
Chloride: 95 mmol/L — ABNORMAL LOW (ref 101–111)
GFR calc Af Amer: >60 mL/min (ref >60)
Glucose, Bld: 206 mg/dL — ABNORMAL HIGH (ref 65–99)
Potassium: 4.8 mmol/L (ref 3.5–5.1)
Sodium: 140 mmol/L (ref 135–145)
TOTAL PROTEIN: 6.7 g/dL (ref 6.5–8.1)

## 2016-05-01 LAB — MRSA PCR SCREENING: MRSA by PCR: NEGATIVE

## 2016-05-01 LAB — PHOSPHORUS: Phosphorus: 5 mg/dL — ABNORMAL HIGH (ref 2.5–4.6)

## 2016-05-01 LAB — TROPONIN I
Troponin I: 0.05 ng/mL — ABNORMAL HIGH
Troponin I: 0.05 ng/mL — ABNORMAL HIGH (ref <0.031)
Troponin I: 0.09 ng/mL — ABNORMAL HIGH

## 2016-05-01 LAB — I-STAT CG4 LACTIC ACID, ED: Lactic Acid, Venous: 0.71 mmol/L (ref 0.5–2.0)

## 2016-05-01 LAB — MAGNESIUM: Magnesium: 2.5 mg/dL — ABNORMAL HIGH (ref 1.7–2.4)

## 2016-05-01 LAB — AMMONIA
AMMONIA: 19 umol/L (ref 9–35)
Ammonia: 73 umol/L — ABNORMAL HIGH (ref 9–35)

## 2016-05-01 LAB — BRAIN NATRIURETIC PEPTIDE: B Natriuretic Peptide: 190.9 pg/mL — ABNORMAL HIGH (ref 0.0–100.0)

## 2016-05-01 MED ORDER — ISOSORBIDE MONONITRATE ER 60 MG PO TB24: 60.0000 mg | ORAL_TABLET | Freq: Every day | ORAL | Status: DC

## 2016-05-01 MED ORDER — FAMOTIDINE IN NACL 20-0.9 MG/50ML-% IV SOLN
20.0000 mg | Freq: Two times a day (BID) | INTRAVENOUS | Status: DC
  Administered 2016-05-01 – 2016-05-02 (×4): 20 mg via INTRAVENOUS
  Filled 2016-05-01 (×5): qty 50

## 2016-05-01 MED ORDER — IPRATROPIUM-ALBUTEROL 0.5-2.5 (3) MG/3ML IN SOLN
3.0000 mL | RESPIRATORY_TRACT | Status: DC | PRN
  Administered 2016-05-03: 3 mL via RESPIRATORY_TRACT
  Filled 2016-05-01: qty 3

## 2016-05-01 MED ORDER — NITROGLYCERIN 0.4 MG SL SUBL: 0.4000 mg | SUBLINGUAL_TABLET | SUBLINGUAL | Status: DC | PRN

## 2016-05-01 MED ORDER — ROSUVASTATIN CALCIUM 10 MG PO TABS
10.0000 mg | ORAL_TABLET | Freq: Every day | ORAL | Status: DC
  Administered 2016-05-02 – 2016-05-06 (×5): 10 mg via ORAL
  Filled 2016-05-01 (×5): qty 1

## 2016-05-01 MED ORDER — VANCOMYCIN HCL IN DEXTROSE 1-5 GM/200ML-% IV SOLN
1000.0000 mg | INTRAVENOUS | Status: DC
  Filled 2016-05-01: qty 200

## 2016-05-01 MED ORDER — SODIUM CHLORIDE 0.9 % IV SOLN
INTRAVENOUS | Status: DC
  Administered 2016-05-01: 11:00:00 via INTRAVENOUS

## 2016-05-01 MED ORDER — METHYLPREDNISOLONE SODIUM SUCC 40 MG IJ SOLR
40.0000 mg | Freq: Three times a day (TID) | INTRAMUSCULAR | Status: DC
  Administered 2016-05-01 – 2016-05-02 (×3): 40 mg via INTRAVENOUS
  Filled 2016-05-01 (×3): qty 1

## 2016-05-01 MED ORDER — IPRATROPIUM-ALBUTEROL 0.5-2.5 (3) MG/3ML IN SOLN
3.0000 mL | Freq: Four times a day (QID) | RESPIRATORY_TRACT | Status: DC
  Administered 2016-05-01 – 2016-05-02 (×5): 3 mL via RESPIRATORY_TRACT
  Filled 2016-05-01 (×4): qty 3

## 2016-05-01 MED ORDER — ASPIRIN EC 81 MG PO TBEC
81.0000 mg | DELAYED_RELEASE_TABLET | Freq: Every day | ORAL | Status: DC
  Administered 2016-05-02 – 2016-05-06 (×5): 81 mg via ORAL
  Filled 2016-05-01 (×5): qty 1

## 2016-05-01 MED ORDER — ALBUTEROL SULFATE (2.5 MG/3ML) 0.083% IN NEBU
2.5000 mg | INHALATION_SOLUTION | Freq: Once | RESPIRATORY_TRACT | Status: AC
  Administered 2016-05-01: 2.5 mg via RESPIRATORY_TRACT

## 2016-05-01 MED ORDER — LORAZEPAM 2 MG/ML PO CONC
1.0000 mg | ORAL | Status: DC | PRN
  Filled 2016-05-01: qty 0.5

## 2016-05-01 MED ORDER — METOPROLOL TARTRATE 25 MG PO TABS
12.5000 mg | ORAL_TABLET | Freq: Two times a day (BID) | ORAL | Status: DC
  Administered 2016-05-01 – 2016-05-06 (×10): 12.5 mg via ORAL
  Filled 2016-05-01 (×10): qty 1

## 2016-05-01 MED ORDER — BUDESONIDE 0.25 MG/2ML IN SUSP
0.2500 mg | Freq: Two times a day (BID) | RESPIRATORY_TRACT | Status: DC
  Administered 2016-05-01 – 2016-05-05 (×9): 0.25 mg via RESPIRATORY_TRACT
  Filled 2016-05-01 (×10): qty 2

## 2016-05-01 MED ORDER — PIPERACILLIN-TAZOBACTAM 3.375 G IVPB
3.3750 g | Freq: Three times a day (TID) | INTRAVENOUS | Status: DC
  Administered 2016-05-01 – 2016-05-03 (×6): 3.375 g via INTRAVENOUS
  Filled 2016-05-01 (×6): qty 50

## 2016-05-01 MED ORDER — ALBUTEROL SULFATE (2.5 MG/3ML) 0.083% IN NEBU: 2.5000 mg | INHALATION_SOLUTION | RESPIRATORY_TRACT | Status: DC

## 2016-05-01 MED ORDER — MORPHINE SULFATE (CONCENTRATE) 10 MG/0.5ML PO SOLN
2.0000 mg | ORAL | Status: DC | PRN
  Filled 2016-05-01: qty 0.5

## 2016-05-01 MED ORDER — VANCOMYCIN HCL IN DEXTROSE 1-5 GM/200ML-% IV SOLN
1000.0000 mg | Freq: Two times a day (BID) | INTRAVENOUS | Status: DC
  Administered 2016-05-01 – 2016-05-02 (×4): 1000 mg via INTRAVENOUS
  Filled 2016-05-01 (×4): qty 200

## 2016-05-01 MED ORDER — PIPERACILLIN-TAZOBACTAM 3.375 G IVPB 30 MIN
3.3750 g | INTRAVENOUS | Status: DC
  Filled 2016-05-01: qty 50

## 2016-05-01 NOTE — Progress Notes (Signed)
Pt placed on Venti mask per MD.

## 2016-05-01 NOTE — Progress Notes (Signed)
[facility] ED CM noted CM consult from [place]  Can patient be placed in Hospice?  Per EDP noted he has spoken with Hospice nurse and found out pt is palliative care This nurse and palliative care staff can address this for EDP or EDP can consult with oncologist/Hospice/palliative care MD

## 2016-05-01 NOTE — Consult Note (Addendum)
Consultation Note Date: 05/01/2016   Patient Name: Sean Figueroa  DOB: [birth year]  MRN: [id number]  Age / Sex: 82 y.o., male  PCP: [name], MD Referring Physician: Bonnell Public, MD  Reason for Consultation: Establishing goals of care and terminal care.   HPI/Patient Profile: 82 y.o. male   admitted on 05/01/2016    Clinical Assessment and Goals of Care:  Life limiting illness: Adenocarcinoma of the esophagus, underlying chronic medical conditions of congestive heart failure, chronic obstructive pulmonary disease, patient enrolled in hospice care  82 year old gentleman who lives at home with his son who reportedly has mental disabilities. Patient was the primary caregiver for her son. Patient's neighbor/friend Sean Figueroa at [phone] is his healthcare power of attorney agent. Patient was last seen by palliative service in February 2017 was then he was hospitalized at [facility] for GI bleed, workup showing adenocarcinoma of the distal esophagus. Patient was seen and evaluated by oncology. He was not deemed an appropriate candidate for systemic chemotherapy. He underwent palliative radiation treatments which were completed April 2017. Patient has since been enrolled in hospice care.  Patient arrived to the emergency department today because he fell and hit his head and life-prolonging alert button was activated. Call placed and discussed further with hospice nurse Amy. She reports that a few days ago the patient was having respiratory symptoms and was deemed to have either CHF or COPD exacerbation. His diuretics were increased/changed. There have been ongoing efforts to find a skilled nursing facility for the patient as well as the patient's son.  The patient has been admitted to the hospitalist service since early this morning for sepsis, likely respiratory source. He has been placed on BiPAP he has  been placed on broad-spectrum antibiotics. Palliative care consult placed for additional goals of care discussions, address scope of treatment desired, overall disposition.  Patient is on the BiPAP in stepdown bed. He opens his eyes when his name is called loudly. Otherwise he does not engage meaningfully. He does not track me in the room. He does not follow commands. There is no family present at the bedside. Appears to have audible expiratory wheezing. Appears to have some cooling of his peripheral extremities.  Call placed and discussed with healthcare power of attorney agent friend Sean Figueroa at [phone]. Patient's CODE STATUS of DO NOT RESUSCITATE/DO NOT INTUBATE reconfirmed with a healthcare power of attorney agent. Discussed that the patient appears to have entered into the dying process. Discussed with her prognosis likely is hours-days, anticipated hospital death. Discussed upon no escalation of care such as pressors etc. However, friend request that the patient be continued on BiPAP and antibiotics-current mode of care at least until this afternoon when he can come by and see the patient and he will bring the patient's son along with him. It is reported that the patient also has another daughter who has cerebral palsy she lives in [place], [place]. Patient's wife is deceased and patient does not have any siblings.    HCPOA  friend neighbor Sean Figueroa  SUMMARY OF RECOMMENDATIONS     Friend, healthcare power of attorney agent Sean Figueroa confirms DO NOT RESUSCITATE/DO NOT INTUBATE. No further escalation of care, will D/C head CT To remain on BiPAP and antibiotics for now. Sean Figueroa has a doctor's appointment himself, he will arrive at the hospital with the patient's son around 1600 on 6-14. Prognosis: Hours-days. Discussed frankly with healthcare power of attorney agent that the patient may not survive this hospitalization, anticipate hospital death.  A mode of care that  focuses exclusively on comfort is deemed most appropriate. Recommend discontinuation of BiPAP, antibiotics-full scope of comfort measures using opioids/benzodiazepines for comfort purposes. Await arrival of healthcare power of attorney agents friend Sean Figueroa for this afternoon for additional discussions. Further recommendations to follow. Thank you for the consult.  Code Status/Advance Care Planning:  DNR    Symptom Management:    continue current treatment for now.   Palliative Prophylaxis:   Bowel Regimen   Psycho-social/Spiritual:   Desire for further Chaplaincy support:no  Additional Recommendations: needs support for placement for son.   Prognosis:   Hours - Days  Discharge Planning: Anticipated Hospital Death      Primary Diagnoses: Present on Admission:  . Hypercapnic respiratory failure ([place]) . Acute on chronic respiratory failure with hypercapnia ([place])  I have reviewed the medical record, interviewed the patient and family, and examined the patient. The following aspects are pertinent.  Past Medical History  Diagnosis Date  . 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 Has chronic angina; S/P DES to the LCX and DES to the L MAIN 12/2011. Now on Plavix and ASA.   Marland Kitchen Hyperlipidemia   . Hypertension   . Long-term (current) use of anticoagulants     For hx of LV apical aneurysm  . LVH (left ventricular hypertrophy)   . Atrial fibrillation ([place])     Unclear history  . Tobacco abuse   . Systolic heart failure     EF is 35% per cath 12/2011  . Left ventricular apical thrombus ([place])     hx/notes 10/06/2014  . On home oxygen therapy     "2.5L 24/7" (10/06/2014)  . COPD (chronic obstructive pulmonary disease) ([place])   . Pneumonia "several times"  . Arthritis     "shoulders; back" (10/06/2014)  . History of gout   . Chronic back pain     "anytime it's wet  and cold" (10/06/2014)  . Nocturia   . Syncopal episodes   . Allergy   . CHF (congestive heart failure) ([place])   . Myocardial infarction [facility]) 1981    Sean Figueroa 01/14/2012   Social History   Social History  . Marital Status: Widowed    Spouse Name: N/A  . Number of Children: N/A  . Years of Education: N/A   Social History Main Topics  . Smoking status: Current Every Day Smoker -- 0.75 packs/day for 65 years    Types: Cigarettes  . Smokeless tobacco: Never Used  . Alcohol Use: Yes     Comment: "quit drinking in the 1990's"  . Drug Use: No  . Sexual Activity: Not Currently   Other Topics Concern  . None   Social History Narrative   Family History  Problem Relation Age of Onset  . Heart disease Mother   . Cerebral palsy Daughter   . Hypertension Mother  Scheduled Meds: . aspirin EC  81 mg Oral Daily  . budesonide (PULMICORT) nebulizer solution  0.25 mg Nebulization BID  . famotidine (PEPCID) IV  20 mg Intravenous Q12H  . ipratropium-albuterol  3 mL Nebulization QID  . methylPREDNISolone (SOLU-MEDROL) injection  40 mg Intravenous Q8H  . metoprolol tartrate  12.5 mg Oral BID  . piperacillin-tazobactam (ZOSYN)  IV  3.375 g Intravenous Q8H  . rosuvastatin  10 mg Oral Daily  . vancomycin  1,000 mg Intravenous Q12H  . [COMPLETED] sodium chloride   Intravenous STAT   Continuous Infusions:  PRN Meds:.ipratropium-albuterol, nitroGLYCERIN Medications Prior to Admission:  Prior to Admission medications   Medication Sig Start Date End Date Taking? Authorizing Provider  allopurinol (ZYLOPRIM) 100 MG tablet Take 100 mg by mouth daily.  03/23/12  Yes Historical Provider, MD  ALPRAZolam Duanne Moron) 0.25 MG tablet Take 1 tablet (0.25 mg total) by mouth 2 (two) times daily as needed for anxiety. 01/01/16  Yes Florencia Reasons, MD  aspirin EC 81 MG tablet Take 81 mg by mouth daily.   Yes Historical Provider, MD  CALCIUM PO Take 1 tablet by mouth daily.   Yes Historical Provider, MD  clopidogrel  (PLAVIX) 75 MG tablet Take 75 mg by mouth daily.   Yes Historical Provider, MD  [employer] D3 1000 UNITS capsule Take 1,000 Units by mouth daily. 09/11/14  Yes Historical Provider, MD  dexamethasone (DECADRON) 4 MG tablet Take 4 mg by mouth daily.   Yes Historical Provider, MD  feeding supplement, ENSURE ENLIVE, (ENSURE ENLIVE) LIQD Take 237 mLs by mouth 2 (two) times daily between meals. 12/30/15  Yes Florencia Reasons, MD  FLUoxetine (PROZAC) 10 MG capsule Take 10 mg by mouth every morning.   Yes Historical Provider, MD  furosemide (LASIX) 20 MG tablet TAKE 1 TABLET (20 MG TOTAL) BY MOUTH EVERY MONDAY, WEDNESDAY, AND FRIDAY. 01/31/16  Yes Darlin Coco, MD  guaiFENesin (MUCINEX) 600 MG 12 hr tablet Take 600 mg by mouth 2 (two) times daily.    Yes Historical Provider, MD  HYDROcodone-acetaminophen (NORCO) 5-325 MG tablet Take 1-2 tablets by mouth every 6 (six) hours as needed for moderate pain. 01/12/16  Yes Hayden Pedro, PA-C  hydrocortisone 2.5 % cream Apply 1 application topically 2 (two) times daily.   Yes Historical Provider, MD  meclizine (ANTIVERT) 25 MG tablet Take 1 tablet (25 mg total) by mouth 3 (three) times daily as needed for dizziness. Additional refills from PCP 09/05/15  Yes Darlin Coco, MD  metoprolol tartrate (LOPRESSOR) 25 MG tablet Take 0.5 tablets (12.5 mg total) by mouth 2 (two) times daily. 01/01/16  Yes Florencia Reasons, MD  mirtazapine (REMERON) 15 MG tablet Take 15 mg by mouth at bedtime.   Yes Historical Provider, MD  NITROSTAT 0.4 MG SL tablet PLACE 1 TABLET (0.4 MG TOTAL) UNDER THE TONGUE EVERY 5 (FIVE) MINUTES AS NEEDED. 04/28/15  Yes Darlin Coco, MD  pantoprazole (PROTONIX) 40 MG tablet TAKE 1 TABLET BY MOUTH DAILY 12/12/15  Yes Darlin Coco, MD  potassium chloride SA (KLOR-CON M20) 20 MEQ tablet Take 1 tablet (20 mEq total) by mouth daily. 01/01/16  Yes Florencia Reasons, MD  pramipexole (MIRAPEX) 0.125 MG tablet Take 0.125 mg by mouth at bedtime. 12/12/15  Yes Historical Provider, MD    PROAIR HFA 108 (90 BASE) MCG/ACT inhaler INHALE 2 PUFFS INTO LUNGS EVERY 6 HOURS AS NEEDED FOR WHEEZING Patient taking differently: Inhale 1 puff 4 times a day as needed for wheezing 07/18/13  Yes Marcello Moores  Brackbill, MD  ranitidine (ZANTAC) 150 MG capsule Take 150 mg by mouth at bedtime. 12/06/15  Yes Historical Provider, MD  rosuvastatin (CRESTOR) 10 MG tablet Take 10 mg by mouth daily.   Yes Historical Provider, MD  temazepam (RESTORIL) 30 MG capsule TAKE ONE CAPSULE BY MOUTH AT BEDTIME AS NEEDED Patient taking differently: TAKE ONE CAPSULE BY MOUTH AT BEDTIME AS NEEDED FOR SLEEP 06/14/15  Yes Darlin Coco, MD   Allergies  Allergen Reactions  . Indomethacin Other (See Comments)    Unknown reaction  . Lipitor [Atorvastatin Calcium] Other (See Comments)    Leg pain  . Pravachol Other (See Comments)    Leg pain  . Procardia [Nifedipine] Other (See Comments)    Reaction unknown  . Zaroxolyn [Metolazone] Other (See Comments)    Reaction unknown   Review of Systems Non verbal, does not follow commands  Physical Exam Weak elderly gentleman resting in bed Appears to be in mild-moderate respiratory distress On BiPAP Opens eyes when name is called, does not verbalize does not follow commands Diffuse wheezes anterior lung fields S1-S2 Abdomen soft Extremities: Bruises bilateral both upper extremities, lower extremities with coolness but no mottling yet  Vital Signs: BP 129/65 mmHg  Pulse 86  Temp(Src) 97.7 F (36.5 C) (Axillary)  Resp 22  Ht 5\' 8"  (1.727 m)  Wt 78.7 kg (173 lb 8 oz)  BMI 26.39 kg/m2  SpO2 94% Pain Assessment: CPOT   Pain Score: 0-No pain   SpO2: SpO2: 94 % O2 Device:SpO2: 94 % O2 Flow Rate: .O2 Flow Rate (L/min): 10 L/min  IO: Intake/output summary: No intake or output data in the 24 hours ending 05/01/16 1422  LBM:   Baseline Weight: Weight: 67.132 kg (148 lb) Most recent weight: Weight: 78.7 kg (173 lb 8 oz)     Palliative  Assessment/Data:   Flowsheet Rows        Most Recent Value   Intake Tab    Referral Department  Hospitalist   Unit at Time of Referral  ICU   Palliative Care Primary Diagnosis  Cancer   Palliative Care Type  New Palliative care   Reason for referral  Non-pain Symptom, End of Life Care Assistance   Date first seen by Palliative Care  05/01/16   Clinical Assessment    Palliative Performance Scale Score  10%   Pain Max last 24 hours  7   Pain Min Last 24 hours  4   Dyspnea Max Last 24 Hours  6   Dyspnea Min Last 24 hours  4   Psychosocial & Spiritual Assessment    Palliative Care Outcomes    Patient/Family meeting held?  Yes   Who was at the meeting?  [place] over the phone.    Palliative Care follow-up planned  Yes, Facility      Time In: 1300 Time Out: 1420 Time Total: 80 min  Greater than 50%  of this time was spent counseling and coordinating care related to the above assessment and plan.  Signed by: Loistine Chance, MD  [phone]  Please contact Palliative Medicine Team phone at [phone] for questions and concerns.  For individual provider: See Shea Evans

## 2016-05-01 NOTE — H&P (Signed)
History and Physical  Sean Figueroa [id number] DOB: March 07, 1933 DOA: 05/01/2016  Referring physician: ER Physician PCP: Sean Panda, MD  Outpatient Specialists: Hospice care   Patient coming from: Home  Chief Complaint: Altered Mentation and respiratory failure  HPI: 82 year old male under hospice care for adenocarcinoma of the esophagus. Patient also caries diagnosis of COPD on home oxygen, CAD, MI, Hyperlipidemia, Hypertension, afib, tobacco abuse, Left ventricular aneurysm with clot, CHF with documented EF of 35%. Patient is DNI and DNR. Patient is said to have become unresponsive earlier today likely secondary due to severely elevated CO2, and may have had a fall at home. Patient is also wheezing. On presentation to the ER, patient was unresponsive, and remains so, with PH of 7.13, PCO2 that was so high that it was not recordable. WBC is elevated at 15. Patient is not able to participate in any history. Patient is a widower, and has a son that is mentally challenged that is cared for by the patient. I also discussed with the Hospice Nurse.   Pertinent labs: See above. EKG: Independently reviewed.  Imaging: independently reviewed.   Review of Systems: Unobtainable.  Past Medical History  Diagnosis Date  . 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 Has chronic angina; S/P DES to the LCX and DES to the L MAIN 12/2011. Now on Plavix and ASA.   Marland Kitchen Hyperlipidemia   . Hypertension   . Long-term (current) use of anticoagulants     For hx of LV apical aneurysm  . LVH (left ventricular hypertrophy)   . Atrial fibrillation ([place])     Unclear history  . Tobacco abuse   . Systolic heart failure     EF is 35% per cath 12/2011  . Left ventricular apical thrombus ([place])     hx/notes 10/06/2014  . On home oxygen therapy     "2.5L 24/7" (10/06/2014)  . COPD (chronic obstructive pulmonary  disease) ([place])   . Pneumonia "several times"  . Arthritis     "shoulders; back" (10/06/2014)  . History of gout   . Chronic back pain     "anytime it's wet and cold" (10/06/2014)  . Nocturia   . Syncopal episodes   . Allergy   . CHF (congestive heart failure) ([place])   . Myocardial infarction [facility]) 1981    [name] 01/14/2012    Past Surgical History  Procedure Laterality Date  . Cholecystectomy    . Hernia repair    . Coronary angioplasty with stent placement  12/2011    LCX and L main - drug eluting  . Cardiac catheterization  1981    [name] 01/14/2012  . Left heart catheterization with coronary angiogram N/A 01/16/2012    Procedure: LEFT HEART CATHETERIZATION WITH CORONARY ANGIOGRAM;  Surgeon: Sherren Mocha, MD;  Location: [facility] CATH LAB;  Service: Cardiovascular;  Laterality: N/A;  . Percutaneous coronary stent intervention (pci-s) N/A 01/20/2012    Procedure: PERCUTANEOUS CORONARY STENT INTERVENTION (PCI-S);  Surgeon: Sherren Mocha, MD;  Location: [facility] CATH LAB;  Service: Cardiovascular;  Laterality: N/A;  . Esophagogastroduodenoscopy (egd) with propofol N/A 12/27/2015    Procedure: ESOPHAGOGASTRODUODENOSCOPY (EGD) WITH PROPOFOL;  Surgeon: Mauri Pole, MD;  Location: [facility] ENDOSCOPY;  Service: Endoscopy;  Laterality: N/A;     reports that he has been smoking Cigarettes.  He has a 48.75 pack-year smoking history. He has never used smokeless tobacco. He  reports that he drinks alcohol. He reports that he does not use illicit drugs.  Allergies  Allergen Reactions  . Indomethacin Other (See Comments)    Unknown reaction  . Lipitor [Atorvastatin Calcium] Other (See Comments)    Leg pain  . Pravachol Other (See Comments)    Leg pain  . Procardia [Nifedipine] Other (See Comments)    Reaction unknown  . Zaroxolyn [Metolazone] Other (See Comments)    Reaction unknown    Family History  Problem Relation Age of Onset  . Heart disease Mother   . Cerebral palsy Daughter   .  Hypertension Mother      Prior to Admission medications   Medication Sig Start Date End Date Taking? Authorizing Provider  allopurinol (ZYLOPRIM) 100 MG tablet Take 100 mg by mouth daily.  03/23/12   Historical Provider, MD  ALPRAZolam Duanne Moron) 0.25 MG tablet Take 1 tablet (0.25 mg total) by mouth 2 (two) times daily as needed for anxiety. 01/01/16   Florencia Reasons, MD  amoxicillin-clavulanate (AUGMENTIN) 875-125 MG tablet Take 1 tablet by mouth 2 (two) times daily. Patient not taking: Reported on 01/25/2016 01/01/16   Florencia Reasons, MD  aspirin EC 81 MG tablet Take 81 mg by mouth daily.    Historical Provider, MD  CALCIUM PO Take 1 tablet by mouth daily.    Historical Provider, MD  [employer] D3 1000 UNITS capsule Take 1,000 Units by mouth daily. 09/11/14   Historical Provider, MD  feeding supplement, ENSURE ENLIVE, (ENSURE ENLIVE) LIQD Take 237 mLs by mouth 2 (two) times daily between meals. 12/30/15   Florencia Reasons, MD  furosemide (LASIX) 20 MG tablet TAKE 1 TABLET (20 MG TOTAL) BY MOUTH EVERY MONDAY, WEDNESDAY, AND FRIDAY. 01/31/16   Darlin Coco, MD  guaiFENesin (MUCINEX) 600 MG 12 hr tablet Take 1,200 mg by mouth at bedtime.     Historical Provider, MD  HYDROcodone-acetaminophen (NORCO) 5-325 MG tablet Take 1-2 tablets by mouth every 6 (six) hours as needed for moderate pain. 01/12/16   Hayden Pedro, PA-C  ipratropium-albuterol (DUONEB) 0.5-2.5 (3) MG/3ML SOLN Take 3 mLs by nebulization 4 (four) times daily.    Historical Provider, MD  isosorbide mononitrate (IMDUR) 60 MG 24 hr tablet TAKE 1 TABLET (60 MG TOTAL) BY MOUTH 2 (TWO) TIMES DAILY. 10/11/15   Historical Provider, MD  meclizine (ANTIVERT) 25 MG tablet Take 1 tablet (25 mg total) by mouth 3 (three) times daily as needed for dizziness. Additional refills from PCP 09/05/15   Darlin Coco, MD  metoprolol tartrate (LOPRESSOR) 25 MG tablet Take 0.5 tablets (12.5 mg total) by mouth 2 (two) times daily. 01/01/16   Florencia Reasons, MD  NITROSTAT 0.4 MG SL tablet  PLACE 1 TABLET (0.4 MG TOTAL) UNDER THE TONGUE EVERY 5 (FIVE) MINUTES AS NEEDED. 04/28/15   Darlin Coco, MD  pantoprazole (PROTONIX) 40 MG tablet TAKE 1 TABLET BY MOUTH DAILY 12/12/15   Darlin Coco, MD  potassium chloride SA (KLOR-CON M20) 20 MEQ tablet Take 1 tablet (20 mEq total) by mouth daily. 01/01/16   Florencia Reasons, MD  pramipexole (MIRAPEX) 0.125 MG tablet Take 0.125 mg by mouth at bedtime. 12/12/15   Historical Provider, MD  PROAIR HFA 108 (90 BASE) MCG/ACT inhaler INHALE 2 PUFFS INTO LUNGS EVERY 6 HOURS AS NEEDED FOR WHEEZING 07/18/13   Darlin Coco, MD  ranitidine (ZANTAC) 150 MG capsule Take 150 mg by mouth at bedtime. 12/06/15   Historical Provider, MD  rosuvastatin (CRESTOR) 10 MG tablet Take 10 mg by  mouth daily.    Historical Provider, MD  temazepam (RESTORIL) 30 MG capsule TAKE ONE CAPSULE BY MOUTH AT BEDTIME AS NEEDED Patient taking differently: TAKE ONE CAPSULE BY MOUTH AT BEDTIME AS NEEDED FOR SLEEP 06/14/15   Darlin Coco, MD  traMADol (ULTRAM) 50 MG tablet Take 50 mg by mouth every 6 (six) hours as needed for moderate pain.    Historical Provider, MD    Physical Exam: Filed Vitals:   05/01/16 0832 05/01/16 0900 05/01/16 0930 05/01/16 1000  BP: 111/52 119/63 132/58   Pulse: 86 83 85   Temp:      TempSrc:      Resp: 30 29 33   Height:    5\' 8"  (1.727 m)  Weight:    78.7 kg (173 lb 8 oz)  SpO2: 92% 96% 89%     Constitutional:  . Unresponsive. Eyes:  . No pallor. No jaundice.  ENMT:  . external ears, nose appear normal Neck:  . Neck is supple. No JVD Respiratory:  . Minimal air entry with wide spread expiratory wheeze. Cardiovascular:  . S1S2  Abdomen:  . Abdomen is obese, soft and non tender. Organs are difficult to assess. Neurologic:  . Unresponsive.  Wt Readings from Last 3 Encounters:  05/01/16 78.7 kg (173 lb 8 oz)  01/24/16 67.495 kg (148 lb 12.8 oz)  01/19/16 67.087 kg (147 lb 14.4 oz)    I have personally reviewed following labs and  imaging studies  Labs on Admission:  CBC:  Recent Labs Lab 05/01/16 0735  WBC 15.0*  NEUTROABS 12.7*  HGB 13.1  HCT 41.6  MCV 108.6*  PLT 123XX123   Basic Metabolic Panel:  Recent Labs Lab 05/01/16 0735  NA 140  K 4.8  CL 95*  CO2 42*  GLUCOSE 206*  BUN 30*  CREATININE 0.74  CALCIUM 8.8*   Liver Function Tests:  Recent Labs Lab 05/01/16 0735  AST 30  ALT 63  ALKPHOS 116  BILITOT 0.6  PROT 6.7  ALBUMIN 3.6   No results for input(s): LIPASE, AMYLASE in the last 168 hours.  Recent Labs Lab 05/01/16 0735  AMMONIA 19   Coagulation Profile: No results for input(s): INR, PROTIME in the last 168 hours. Cardiac Enzymes: No results for input(s): CKTOTAL, CKMB, CKMBINDEX, TROPONINI in the last 168 hours. BNP (last 3 results) No results for input(s): PROBNP in the last 8760 hours. HbA1C: No results for input(s): HGBA1C in the last 72 hours. CBG: No results for input(s): GLUCAP in the last 168 hours. Lipid Profile: No results for input(s): CHOL, HDL, LDLCALC, TRIG, CHOLHDL, LDLDIRECT in the last 72 hours. Thyroid Function Tests: No results for input(s): TSH, T4TOTAL, FREET4, T3FREE, THYROIDAB in the last 72 hours. Anemia Panel: No results for input(s): VITAMINB12, FOLATE, FERRITIN, TIBC, IRON, RETICCTPCT in the last 72 hours. Urine analysis:    Component Value Date/Time   COLORURINE AMBER* 12/24/2015 1905   APPEARANCEUR CLEAR 12/24/2015 1905   LABSPEC 1.020 12/24/2015 1905   PHURINE 5.0 12/24/2015 1905   GLUCOSEU NEGATIVE 12/24/2015 1905   HGBUR NEGATIVE 12/24/2015 1905   BILIRUBINUR SMALL* 12/24/2015 1905   KETONESUR NEGATIVE 12/24/2015 1905   PROTEINUR NEGATIVE 12/24/2015 1905   NITRITE NEGATIVE 12/24/2015 1905   LEUKOCYTESUR NEGATIVE 12/24/2015 1905   Sepsis Labs: @LABRCNTIP (procalcitonin:4,lacticidven:4) )No results found for this or any previous visit (from the past 240 hour(s)).    Radiological Exams on Admission: Dg Chest Port 1  View  05/01/2016  CLINICAL DATA:  Unwitnessed fall. Increased  shortness of breath and wheezing. History of asthma. EXAM: PORTABLE CHEST 1 VIEW COMPARISON:  CT chest 12/27/2015. FINDINGS: Chronic lingular atelectasis/ opacity as demonstrated on prior CT, with pleural effusion, roughly stable to priors. No focal areas of consolidation. Crowded vessels at the RIGHT base reflect poor inspiratory effort. Normal heart size. No rib fracture. Thoracic spondylosis. No pneumothorax. IMPRESSION: Stable chest.  Chronic changes as described. Electronically Signed   By: Staci Righter M.D.   On: 05/01/2016 07:37    EKG: Independently reviewed.   Active Problems:   Hypercapnic respiratory failure (HCC)   Acute on chronic respiratory failure with hypercapnia (HCC)   Assessment/Plan 1. Unresponsiveness/metabolic encephalopathy - Likely secondary to CO2 Narcosis from COPD with respiratory failure 2. Acute on chronic respiratory failure 3. COPD with exacerbation 4. Adenocarcinoma of esophagus, under Hospice care 5. CHF, compensated 6. History of LV aneurysm and thrombus 7. CAD by histotry   Admit patient to step down  Patient is DNI/DNR  Hospice/palliative care consult  IV steroids  BiPAP  Nebs duoneb and pulmicort  IV antibiotics  CT head  Define goal of care  Repeat ABG after 1-2 hours on BiPAP  Continue to address goal of care  Poor/guarded prognosis.   DVT prophylaxis: SCD. Possible [place] Lovenox if intracranial abnormality is ruled out Code Status: DNI/DNR Family Communication:  Disposition Plan: Undetermined   Consults called: Palliative care   Admission status: Inpatient    Time spent: 60 minutes  Dana Allan, MD  [facility] Pager #: [phone] 7PM-7AM contact night coverage as above   05/01/2016, 10:49 AM

## 2016-05-01 NOTE — Progress Notes (Signed)
Pharmacy Antibiotic Note  Sean Figueroa is a 82 y.o. male admitted on 05/01/2016 with sepsis.  Pharmacy has been consulted for Vancomycin and Zosyn dosing.  Plan: Vancomycin 1g IV every 12 hours.  Goal trough 15-20 mcg/mL. Zosyn 3.375g IV q8h (4 hour infusion).  F/u SCr, trough levels as indicated, culture results, clinical course, goals of care.  Height: 5\' 8"  (172.7 cm) Weight: 173 lb 8 oz (78.7 kg) IBW/kg (Calculated) : 68.4  Temp (24hrs), Avg:96.8 F (36 C), Min:96.8 F (36 C), Max:96.8 F (36 C)   Recent Labs Lab 05/01/16 0735 05/01/16 0758  WBC 15.0*  --   CREATININE 0.74  --   LATICACIDVEN  --  0.71    Estimated Creatinine Clearance: 71.3 mL/min (by C-G formula based on Cr of 0.74).    Allergies  Allergen Reactions  . Indomethacin Other (See Comments)    Unknown reaction  . Lipitor [Atorvastatin Calcium] Other (See Comments)    Leg pain  . Pravachol Other (See Comments)    Leg pain  . Procardia [Nifedipine] Other (See Comments)    Reaction unknown  . Zaroxolyn [Metolazone] Other (See Comments)    Reaction unknown    Antimicrobials this admission: 6/14 Vancomycin >>  6/14 Zosyn >>   Dose adjustments this admission: -  Microbiology results: 6/14 BCx: sent 6/14 UCx: ordered  6/14 Sputum: ordered  6/14 MRSA PCR: sent  Thank you for allowing pharmacy to be a part of this patient's care.  Hershal Coria 05/01/2016 11:12 AM

## 2016-05-01 NOTE — Progress Notes (Signed)
Cm consulted [facility] staff to see if pt is active  [place] Board confirmed pt is not active with  [facility]

## 2016-05-01 NOTE — Progress Notes (Signed)
Rt placed pt on NIV/BIPAP per MD order.

## 2016-05-01 NOTE — ED Notes (Signed)
Per EMS- Pt had unwitnessed fall when leg gave out- no LOC, lac to bridge of nose. SOB after being transported. Inspiratory wheezing, rhonchi. Hx of COPD, asthma.

## 2016-05-01 NOTE — ED Provider Notes (Signed)
CSN: [id number]     Arrival date & time 05/01/16  [id number] History   First MD Initiated Contact with Patient 05/01/16 0701     Chief Complaint  Patient presents with  . Fall  . Shortness of Breath     (Consider location/radiation/quality/duration/timing/severity/associated sxs/prior Treatment) HPI   Sean Figueroa is a 82 y.o. male presents by EMS for evaluation of injuries from fall. He was also noted that he was short of breath so he was given a nebulizer. The patient is unable to give any additional history. History from EMS is vague, as to what happened.  Level V caveat- altered mental status   Past Medical History  Diagnosis Date  . 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 Has chronic angina; S/P DES to the LCX and DES to the L MAIN 12/2011. Now on Plavix and ASA.   Marland Kitchen Hyperlipidemia   . Hypertension   . Long-term (current) use of anticoagulants     For hx of LV apical aneurysm  . LVH (left ventricular hypertrophy)   . Atrial fibrillation ([place])     Unclear history  . Tobacco abuse   . Systolic heart failure     EF is 35% per cath 12/2011  . 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 On home oxygen therapy     "2.5L 24/7" (10/06/2014)  . COPD (chronic obstructive pulmonary disease) ([place])   . Pneumonia "several times"  . Arthritis     "shoulders; back" (10/06/2014)  . History of gout   . Chronic back pain     "anytime it's wet and cold" (10/06/2014)  . Nocturia   . Syncopal episodes   . Allergy   . CHF (congestive heart failure) ([place])   . Myocardial infarction [facility]) 1981    [name] 01/14/2012   Past Surgical History  Procedure Laterality Date  . Cholecystectomy    . Hernia repair    . Coronary angioplasty with stent placement  12/2011    LCX and L main - drug eluting  . Cardiac catheterization  1981    [name] 01/14/2012  . Left heart  catheterization with coronary angiogram N/A 01/16/2012    Procedure: LEFT HEART CATHETERIZATION WITH CORONARY ANGIOGRAM;  Surgeon: Sherren Mocha, MD;  Location: [facility] CATH LAB;  Service: Cardiovascular;  Laterality: N/A;  . Percutaneous coronary stent intervention (pci-s) N/A 01/20/2012    Procedure: PERCUTANEOUS CORONARY STENT INTERVENTION (PCI-S);  Surgeon: Sherren Mocha, MD;  Location: [facility] CATH LAB;  Service: Cardiovascular;  Laterality: N/A;  . Esophagogastroduodenoscopy (egd) with propofol N/A 12/27/2015    Procedure: ESOPHAGOGASTRODUODENOSCOPY (EGD) WITH PROPOFOL;  Surgeon: Mauri Pole, MD;  Location: [facility];  Service: Endoscopy;  Laterality: N/A;   Family History  Problem Relation Age of Onset  . Heart disease Mother   . Cerebral palsy Daughter   . Hypertension Mother    Social History  Substance Use Topics  . Smoking status: Current Every Day Smoker -- 0.75 packs/day for 65 years    Types: Cigarettes  . Smokeless tobacco: Never Used  . Alcohol Use: Yes     Comment: "quit drinking in the 1990's"    Review of Systems  Unable to perform ROS: Mental status change      Allergies  Indomethacin; Lipitor; Pravachol; Procardia; and Zaroxolyn  Home Medications   Prior to Admission medications  Medication Sig Start Date End Date Taking? Authorizing Provider  allopurinol (ZYLOPRIM) 100 MG tablet Take 100 mg by mouth daily.  03/23/12   Historical Provider, MD  ALPRAZolam Duanne Moron) 0.25 MG tablet Take 1 tablet (0.25 mg total) by mouth 2 (two) times daily as needed for anxiety. 01/01/16   Florencia Reasons, MD  amoxicillin-clavulanate (AUGMENTIN) 875-125 MG tablet Take 1 tablet by mouth 2 (two) times daily. Patient not taking: Reported on 01/25/2016 01/01/16   Florencia Reasons, MD  aspirin EC 81 MG tablet Take 81 mg by mouth daily.    Historical Provider, MD  CALCIUM PO Take 1 tablet by mouth daily.    Historical Provider, MD  [employer] D3 1000 UNITS capsule Take 1,000 Units by mouth daily. 09/11/14    Historical Provider, MD  feeding supplement, ENSURE ENLIVE, (ENSURE ENLIVE) LIQD Take 237 mLs by mouth 2 (two) times daily between meals. 12/30/15   Florencia Reasons, MD  furosemide (LASIX) 20 MG tablet TAKE 1 TABLET (20 MG TOTAL) BY MOUTH EVERY MONDAY, WEDNESDAY, AND FRIDAY. 01/31/16   Darlin Coco, MD  guaiFENesin (MUCINEX) 600 MG 12 hr tablet Take 1,200 mg by mouth at bedtime.     Historical Provider, MD  HYDROcodone-acetaminophen (NORCO) 5-325 MG tablet Take 1-2 tablets by mouth every 6 (six) hours as needed for moderate pain. 01/12/16   Hayden Pedro, PA-C  ipratropium-albuterol (DUONEB) 0.5-2.5 (3) MG/3ML SOLN Take 3 mLs by nebulization 4 (four) times daily.    Historical Provider, MD  isosorbide mononitrate (IMDUR) 60 MG 24 hr tablet TAKE 1 TABLET (60 MG TOTAL) BY MOUTH 2 (TWO) TIMES DAILY. 10/11/15   Historical Provider, MD  meclizine (ANTIVERT) 25 MG tablet Take 1 tablet (25 mg total) by mouth 3 (three) times daily as needed for dizziness. Additional refills from PCP 09/05/15   Darlin Coco, MD  metoprolol tartrate (LOPRESSOR) 25 MG tablet Take 0.5 tablets (12.5 mg total) by mouth 2 (two) times daily. 01/01/16   Florencia Reasons, MD  NITROSTAT 0.4 MG SL tablet PLACE 1 TABLET (0.4 MG TOTAL) UNDER THE TONGUE EVERY 5 (FIVE) MINUTES AS NEEDED. 04/28/15   Darlin Coco, MD  pantoprazole (PROTONIX) 40 MG tablet TAKE 1 TABLET BY MOUTH DAILY 12/12/15   Darlin Coco, MD  potassium chloride SA (KLOR-CON M20) 20 MEQ tablet Take 1 tablet (20 mEq total) by mouth daily. 01/01/16   Florencia Reasons, MD  pramipexole (MIRAPEX) 0.125 MG tablet Take 0.125 mg by mouth at bedtime. 12/12/15   Historical Provider, MD  PROAIR HFA 108 (90 BASE) MCG/ACT inhaler INHALE 2 PUFFS INTO LUNGS EVERY 6 HOURS AS NEEDED FOR WHEEZING 07/18/13   Darlin Coco, MD  ranitidine (ZANTAC) 150 MG capsule Take 150 mg by mouth at bedtime. 12/06/15   Historical Provider, MD  rosuvastatin (CRESTOR) 10 MG tablet Take 10 mg by mouth daily.     Historical Provider, MD  temazepam (RESTORIL) 30 MG capsule TAKE ONE CAPSULE BY MOUTH AT BEDTIME AS NEEDED Patient taking differently: TAKE ONE CAPSULE BY MOUTH AT BEDTIME AS NEEDED FOR SLEEP 06/14/15   Darlin Coco, MD  traMADol (ULTRAM) 50 MG tablet Take 50 mg by mouth every 6 (six) hours as needed for moderate pain.    Historical Provider, MD   BP 162/79 mmHg  Pulse 90  Temp(Src) 96.8 F (36 C) (Rectal)  Resp 30  Ht 5\' 7"  (1.702 m)  Wt 148 lb (67.132 kg)  BMI 23.17 kg/m2  SpO2 100% Physical Exam  Constitutional: He appears well-developed.  Elderly, frail  HENT:  Head: Normocephalic.  Right Ear: External ear normal.  Left Ear: External ear normal.  Contusion, mid face, nose. Blood per nares, dry, no facial crepitation or instability to palpation. No scalp or cranial defect palpated or visualized.  Eyes: Conjunctivae and EOM are normal. Pupils are equal, round, and reactive to light.  Neck: Normal range of motion and phonation normal. Neck supple.  Cardiovascular: Normal rate, regular rhythm and normal heart sounds.   Pulmonary/Chest: Effort normal. No respiratory distress. He exhibits no bony tenderness.  Diffuse rhonchi with scattered wheezing. Tachypnea present. No increased work of breathing.  Abdominal: Soft. He exhibits no distension.  Musculoskeletal:  Lower leg swollen, bilaterally, with erythema but no fluctuance or drainage.  Neurological: He is alert. No cranial nerve deficit or sensory deficit. He exhibits normal muscle tone. Coordination normal. GCS eye subscore is 1. GCS verbal subscore is 1. GCS motor subscore is 1.  Skin: Skin is warm, dry and intact.  Psychiatric:  Obtunded  Nursing note and vitals reviewed.   ED Course  Procedures (including critical care time)  Initial clinical impression Severe 07:05- apparent acute respiratory failure, with tachypnea and obtunded state. Will evaluate respiratory status, with blood gas imaging. Initiate laboratory  assessment, and treated for undefined infection.  07:50- I was able to reach Amy, his hospice nurse. She states that yesterday, the patient's diuretics were increased because of leg edema. The patient has expressed a desire to avoid hospitalization. She states that he is a palliative care patient, at this time. She recommends no antibiotics, no advanced airway, and comfort measures. The patient lives with his son, who has a mental disability, and no other close support. A neighbor helps to watch over the patient. She will visit the patient later this morning.   07:55- earlier orders for aggressive evaluation were discontinued. Will obtain routine labs only, give gentle IV fluid hydration, and initiate palliative comfort measures with oral narcotic and benzodiazepine treatments as needed. Note that patient is not a candidate for code sepsis at this time.  08:55- . At this time, the patient has improved mental status. He opens his eyes, to verbalization, and shakes his head and states no when asked if he has pain. He is now able to follow simple commands.   9:00 AM-Consult complete with Hospitalist. Patient case explained and discussed. He agrees to admit patient for further evaluation and treatment. Call ended at [facility] Reviewed  CBC WITH DIFFERENTIAL/PLATELET - Abnormal; Notable for the following:    WBC 15.0 (*)    RBC 3.83 (*)    MCV 108.6 (*)    MCH 34.2 (*)    Neutro Abs 12.7 (*)    All other components within normal limits  BLOOD GAS, ARTERIAL - Abnormal; Notable for the following:    pH, Arterial 7.132 (*)    pO2, Arterial 234 (*)    All other components within normal limits  CULTURE, BLOOD (ROUTINE X 2)  CULTURE, BLOOD (ROUTINE X 2)  URINE CULTURE  COMPREHENSIVE METABOLIC PANEL  URINALYSIS, ROUTINE W REFLEX MICROSCOPIC (NOT AT [facility])  AMMONIA  I-STAT CG4 LACTIC ACID, ED    Imaging Review Dg Chest Port 1 View  05/01/2016  CLINICAL DATA:  Unwitnessed fall.  Increased shortness of breath and wheezing. History of asthma. EXAM: PORTABLE CHEST 1 VIEW COMPARISON:  CT chest 12/27/2015. FINDINGS: Chronic lingular atelectasis/ opacity as demonstrated on prior CT, with pleural effusion, roughly stable to priors. No focal areas of consolidation. Crowded  vessels at the RIGHT base reflect poor inspiratory effort. Normal heart size. No rib fracture. Thoracic spondylosis. No pneumothorax. IMPRESSION: Stable chest.  Chronic changes as described. Electronically Signed   By: Staci Righter M.D.   On: 05/01/2016 07:37   I have personally reviewed and evaluated these images and lab results as part of my medical decision-making.   EKG Interpretation None      MDM   Final diagnoses:  Acute respiratory failure with hypercapnia (HCC)  Palliative care status    Patient with recent decline, now presenting with obtundation and hypercarbic respiratory failure. Per his hospice nurse. He should be cared for on a palliative care status. Therefore, we will initiate comfort care and arrange for admission. Patient is unable to be discharged home since his son who lives there, is not capable of caring for the patient.   Nursing Notes Reviewed/ Care Coordinated, and agree without changes. Applicable Imaging Reviewed.  Interpretation of Laboratory Data incorporated into ED treatment  Plan: Admit    Daleen Bo, MD 05/02/16 2100

## 2016-05-01 NOTE — Progress Notes (Signed)
CSW contacted by pt's nsg this afternoon with a concern relating to pt's adult disabled son. Pt's prognosis is poor. Pt's neighbor ( POA ) is caring for  pt's son at this time. No other family available to assist.  According to pt's nsg, POA reports that pt has not made any arrangements for his son. CSW contacted Nathaniel Man, Surveyor, quantity of CSW for assistance with this pt's situation. . Mr. Rolena Infante recommended DSS ( APS ) be contacted by [facility] and assistance requested. Pt's son will most likely need placement. CSW provided # for DSS/ APS to pt's nsg. Nsg will provide # to POA. CSW left message for Amy ( hospice nsg ) to see if placement has been initiated, for adult son, from the community. Awaiting return call.   Werner Lean LCSW [phone]

## 2016-05-01 NOTE — ED Notes (Signed)
Bed: [id number] Expected date:  Expected time:  Means of arrival:  Comments: 82 yo M  Fall/shortness of breath

## 2016-05-01 NOTE — ED Notes (Signed)
Ativan and Morphine given to [employer].

## 2016-05-02 DIAGNOSIS — C155 Malignant neoplasm of lower third of esophagus: Secondary | ICD-10-CM

## 2016-05-02 DIAGNOSIS — I480 Paroxysmal atrial fibrillation: Secondary | ICD-10-CM

## 2016-05-02 DIAGNOSIS — Z515 Encounter for palliative care: Secondary | ICD-10-CM

## 2016-05-02 DIAGNOSIS — J449 Chronic obstructive pulmonary disease, unspecified: Secondary | ICD-10-CM

## 2016-05-02 DIAGNOSIS — I2510 Atherosclerotic heart disease of native coronary artery without angina pectoris: Secondary | ICD-10-CM

## 2016-05-02 DIAGNOSIS — J9611 Chronic respiratory failure with hypoxia: Secondary | ICD-10-CM

## 2016-05-02 DIAGNOSIS — Z7189 Other specified counseling: Secondary | ICD-10-CM

## 2016-05-02 DIAGNOSIS — R0600 Dyspnea, unspecified: Secondary | ICD-10-CM

## 2016-05-02 LAB — CBC
HCT: 34.3 % — ABNORMAL LOW (ref 39.0–52.0)
Hemoglobin: 11.1 g/dL — ABNORMAL LOW (ref 13.0–17.0)
MCH: 33.9 pg (ref 26.0–34.0)
MCHC: 32.4 g/dL (ref 30.0–36.0)
MCV: 104.9 fL — ABNORMAL HIGH (ref 78.0–100.0)
Platelets: 154 10*3/uL (ref 150–400)
RBC: 3.27 MIL/uL — ABNORMAL LOW (ref 4.22–5.81)
RDW: 15.1 % (ref 11.5–15.5)
WBC: 14.2 10*3/uL — ABNORMAL HIGH (ref 4.0–10.5)

## 2016-05-02 LAB — BASIC METABOLIC PANEL
Anion gap: 7 (ref 5–15)
BUN: 34 mg/dL — ABNORMAL HIGH (ref 6–20)
CO2: 36 mmol/L — ABNORMAL HIGH (ref 22–32)
Calcium: 8.5 mg/dL — ABNORMAL LOW (ref 8.9–10.3)
Chloride: 94 mmol/L — ABNORMAL LOW (ref 101–111)
Creatinine, Ser: 0.85 mg/dL (ref 0.61–1.24)
GFR calc Af Amer: >60 mL/min (ref >60)
GFR calc non Af Amer: >60 mL/min (ref >60)
Glucose, Bld: 273 mg/dL — ABNORMAL HIGH (ref 65–99)
Potassium: 4.2 mmol/L (ref 3.5–5.1)
Sodium: 137 mmol/L (ref 135–145)

## 2016-05-02 MED ORDER — TRAMADOL HCL 50 MG PO TABS
50.0000 mg | ORAL_TABLET | Freq: Once | ORAL | Status: AC
Start: 2016-05-02 — End: 2016-05-02
  Administered 2016-05-02: 50 mg via ORAL
  Filled 2016-05-02: qty 1

## 2016-05-02 MED ORDER — PREDNISONE 10 MG (21) PO TBPK
20.0000 mg | ORAL_TABLET | Freq: Every evening | ORAL | Status: AC
  Administered 2016-05-03: 20 mg via ORAL

## 2016-05-02 MED ORDER — PREDNISONE 10 MG (21) PO TBPK
10.0000 mg | ORAL_TABLET | Freq: Three times a day (TID) | ORAL | Status: AC
  Administered 2016-05-03 (×2): 10 mg via ORAL

## 2016-05-02 MED ORDER — PREDNISONE 10 MG (21) PO TBPK
10.0000 mg | ORAL_TABLET | ORAL | Status: AC
  Administered 2016-05-02: 10 mg via ORAL

## 2016-05-02 MED ORDER — PREDNISONE 10 MG (21) PO TBPK
20.0000 mg | ORAL_TABLET | Freq: Every evening | ORAL | Status: AC
  Administered 2016-05-02: 20 mg via ORAL

## 2016-05-02 MED ORDER — PREDNISONE 10 MG (21) PO TBPK
20.0000 mg | ORAL_TABLET | Freq: Every morning | ORAL | Status: AC
  Administered 2016-05-02: 20 mg via ORAL
  Filled 2016-05-02: qty 21

## 2016-05-02 MED ORDER — PREDNISONE 10 MG (21) PO TBPK
10.0000 mg | ORAL_TABLET | Freq: Four times a day (QID) | ORAL | Status: DC
  Administered 2016-05-04 – 2016-05-06 (×8): 10 mg via ORAL

## 2016-05-02 MED ORDER — IPRATROPIUM-ALBUTEROL 0.5-2.5 (3) MG/3ML IN SOLN
3.0000 mL | Freq: Three times a day (TID) | RESPIRATORY_TRACT | Status: DC
  Administered 2016-05-02 – 2016-05-06 (×10): 3 mL via RESPIRATORY_TRACT
  Filled 2016-05-02 (×11): qty 3

## 2016-05-02 NOTE — Progress Notes (Signed)
[facility] Progress Note  Patient: Sean Figueroa [id number]   PCP: [name], MD DOB: [birth year]   DOA: 05/01/2016   DOS: 05/02/2016   Date of Service: the patient was seen and examined on 05/02/2016  Subjective: The patient is awake alert and oriented and is able to follow command. Mentions his breathing is improving. No chest pain and abdominal pain no nausea no vomiting. Nutrition: Tolerating oral diet  Brief hospital course: Pt. with PMH of adenocarcinoma of the esophagus, chronic CHF, COPD, on hospice; admitted on 05/01/2016, with complaint of unresponsive event with hypoxia, was found to have COPD exacerbation and dehydration. Currently further plan is continue IV antibiotics and monitor for improvement with plan to discharge home with hospice.  Assessment and Plan: 1. Acute on chronic respiratory failure with hypercapnia (HCC) COPD exacerbation. Currently significantly improving. Possibility of medication side effect cannot be ruled out and is also likely. Patient was placed on BiPAP on admission as well as IV antibiotics. Currently significantly improving. We will transfer the patient to telemetry. Continue antibiotics at present.  2. Severe COPD. Next and chronic respiratory failure with hypoxia. Adenocarcinoma of the esophagus. On hospice. Palliative care was consulted yesterday on admission. Next and currently as per recommendation from palliative care the plan is to go home with hospice once stable. We discussed with case management as well as hospice as the patient mentioned that history to get himself into a facility.  3. Coronary artery disease. Continuing home medications.  4. GERD. Continuing PPI.  Pain management: Resume when necessary Norco Activity: On hospice at present Bowel regimen: last BM prior to admission Diet: Cardiac diet DVT Prophylaxis: subcutaneous Heparin  Advance goals of care discussion: DNR/DNI, on hospice  Family Communication:  no family was present at bedside, at the time of interview.   Disposition:  Discharge to home with hospice versus SNF with palliative care. Expected discharge date: 05/03/2016  Consultants: Palliative care Procedures: None  Antibiotics: Anti-infectives    Start     Dose/Rate Route Frequency Ordered Stop   05/01/16 1200  vancomycin (VANCOCIN) IVPB 1000 mg/200 mL premix     1,000 mg 200 mL/hr over 60 Minutes Intravenous Every 12 hours 05/01/16 1115     05/01/16 1200  piperacillin-tazobactam (ZOSYN) IVPB 3.375 g     3.375 g 12.5 mL/hr over 240 Minutes Intravenous Every 8 hours 05/01/16 1115     05/01/16 0745  piperacillin-tazobactam (ZOSYN) IVPB 3.375 g  Status:  Discontinued     3.375 g 100 mL/hr over 30 Minutes Intravenous STAT 05/01/16 0741 05/01/16 0753   05/01/16 0745  vancomycin (VANCOCIN) IVPB 1000 mg/200 mL premix  Status:  Discontinued     1,000 mg 200 mL/hr over 60 Minutes Intravenous STAT 05/01/16 0742 05/01/16 0753        Intake/Output Summary (Last 24 hours) at 05/02/16 1922 Last data filed at 05/02/16 1053  Gross per 24 hour  Intake    950 ml  Output     80 ml  Net    870 ml   Filed Weights   05/01/16 0747 05/01/16 1000  Weight: 67.132 kg (148 lb) 78.7 kg (173 lb 8 oz)    Objective: Physical Exam: Filed Vitals:   05/02/16 1100 05/02/16 1119 05/02/16 1300 05/02/16 1601  BP: 140/67  128/77   Pulse: 107  87   Temp:   98.7 F (37.1 C)   TempSrc:   Oral   Resp: 35  20   Height:   5\' 7"  (  1.702 m)   Weight:      SpO2: 94% 91% 95% 94%    General: Alert, Awake and Oriented to Time, Place and Person. Appear in mild distress Eyes: PERRL, Conjunctiva normal ENT: Oral Mucosa clear moist. Neck: no JVD, no Abnormal Mass Or lumps Cardiovascular: S1 and S2 Present, no Murmur, Respiratory: Bilateral Air entry equal and Decreased, basal Crackles, bilateral wheezes Abdomen: Bowel Sound present, Soft and no tenderness Skin: no redness, no Rash  Extremities: no  Pedal edema, no calf tenderness Neurologic: Grossly no focal neuro deficit. Bilaterally Equal motor strength  Data Reviewed: CBC:  Recent Labs Lab 05/01/16 0735 05/02/16 0250  WBC 15.0* 14.2*  NEUTROABS 12.7*  --   HGB 13.1 11.1*  HCT 41.6 34.3*  MCV 108.6* 104.9*  PLT 165 123456   Basic Metabolic Panel:  Recent Labs Lab 05/01/16 0735 05/01/16 1140 05/02/16 0250  NA 140  --  137  K 4.8  --  4.2  CL 95*  --  94*  CO2 42*  --  36*  GLUCOSE 206*  --  273*  BUN 30*  --  34*  CREATININE 0.74  --  0.85  CALCIUM 8.8*  --  8.5*  MG  --  2.5*  --   PHOS  --  5.0*  --     Liver Function Tests:  Recent Labs Lab 05/01/16 0735  AST 30  ALT 63  ALKPHOS 116  BILITOT 0.6  PROT 6.7  ALBUMIN 3.6   No results for input(s): LIPASE, AMYLASE in the last 168 hours.  Recent Labs Lab 05/01/16 0735 05/01/16 1140  AMMONIA 19 73*   Coagulation Profile: No results for input(s): INR, PROTIME in the last 168 hours. Cardiac Enzymes:  Recent Labs Lab 05/01/16 1140 05/01/16 1650 05/01/16 2248  TROPONINI 0.05* 0.05* 0.09*   BNP (last 3 results) No results for input(s): PROBNP in the last 8760 hours.  CBG: No results for input(s): GLUCAP in the last 168 hours.  Studies: No results found.   Scheduled Meds: . aspirin EC  81 mg Oral Daily  . budesonide (PULMICORT) nebulizer solution  0.25 mg Nebulization BID  . famotidine (PEPCID) IV  20 mg Intravenous Q12H  . ipratropium-albuterol  3 mL Nebulization TID  . metoprolol tartrate  12.5 mg Oral BID  . piperacillin-tazobactam (ZOSYN)  IV  3.375 g Intravenous Q8H  . [START ON 05/03/2016] predniSONE  10 mg Oral 3 x daily with food  . [START ON 05/04/2016] predniSONE  10 mg Oral 4X daily taper  . predniSONE  20 mg Oral Nightly  . [START ON 05/03/2016] predniSONE  20 mg Oral Nightly  . rosuvastatin  10 mg Oral Daily  . vancomycin  1,000 mg Intravenous Q12H   Continuous Infusions:  PRN Meds: ipratropium-albuterol,  nitroGLYCERIN  Time spent: 30 minutes  Author: Berle Mull, MD [facility] Pager: [phone] 05/02/2016 7:22 PM  If 7PM-7AM, please contact night-coverage at [email or web address], password [email or web address]

## 2016-05-02 NOTE — Progress Notes (Signed)
Pt. Received from Step-down unit. Given report by Langley Gauss. Resting comfortably, tolerating po's. Meds in room will be sent home with neighbor.

## 2016-05-02 NOTE — Progress Notes (Signed)
Per Bambi from [facility], pt is active with them for home hospice services.  CM will continue to follow for DC needs. Marney Doctor RN,BSN,NCM [phone]

## 2016-05-03 ENCOUNTER — Inpatient Hospital Stay (HOSPITAL_COMMUNITY): Payer: Medicare Other

## 2016-05-03 LAB — URINE CULTURE: Culture: 80000 — AB

## 2016-05-03 MED ORDER — FAMOTIDINE 20 MG PO TABS
20.0000 mg | ORAL_TABLET | Freq: Two times a day (BID) | ORAL | Status: DC
  Administered 2016-05-03 – 2016-05-06 (×7): 20 mg via ORAL
  Filled 2016-05-03 (×7): qty 1

## 2016-05-03 MED ORDER — CHLORHEXIDINE GLUCONATE 0.12 % MT SOLN
15.0000 mL | Freq: Two times a day (BID) | OROMUCOSAL | Status: DC
  Administered 2016-05-03 – 2016-05-06 (×5): 15 mL via OROMUCOSAL
  Filled 2016-05-03 (×6): qty 15

## 2016-05-03 MED ORDER — FUROSEMIDE 10 MG/ML IJ SOLN
40.0000 mg | Freq: Once | INTRAMUSCULAR | Status: AC
  Administered 2016-05-03: 40 mg via INTRAVENOUS
  Filled 2016-05-03: qty 4

## 2016-05-03 MED ORDER — CETYLPYRIDINIUM CHLORIDE 0.05 % MT LIQD
7.0000 mL | Freq: Two times a day (BID) | OROMUCOSAL | Status: DC
  Administered 2016-05-04 – 2016-05-06 (×4): 7 mL via OROMUCOSAL

## 2016-05-03 MED ORDER — AMOXICILLIN-POT CLAVULANATE 500-125 MG PO TABS
500.0000 mg | ORAL_TABLET | Freq: Two times a day (BID) | ORAL | Status: DC
  Administered 2016-05-03 – 2016-05-06 (×7): 500 mg via ORAL
  Filled 2016-05-03 (×8): qty 1

## 2016-05-03 NOTE — Evaluation (Signed)
Physical Therapy Evaluation Patient Details Name: Sean Figueroa MRN: [id number] DOB: 08-23-1933 Today's Date: 05/03/2016   History of Present Illness  Pt admitted through ED s/p falls with SOB.  Pt with hx of CHF, CAD, A-fib, COPD and MI  Clinical Impression  Pt admitted as above and presenting with functional mobility limitations 2* generalized weakness, SOB with exertion, and ambulatory balance deficits.  Pt would benefit from follow up rehab at SNF level to maximize IND and safety prior to dc home with ltd assist.    Follow Up Recommendations SNF    Equipment Recommendations  None recommended by PT    Recommendations for Other Services       Precautions / Restrictions Precautions Precautions: Fall Precaution Comments: Pt with hx of falls and home O2 @ 4L Restrictions Weight Bearing Restrictions: No      Mobility  Bed Mobility Overal bed mobility: Needs Assistance Bed Mobility: Supine to Sit     Supine to sit: Min assist     General bed mobility comments: min assist to bring trunk to upright  Transfers Overall transfer level: Needs assistance Equipment used: Rolling walker (2 wheeled) Transfers: Sit to/from Stand Sit to Stand: Min assist;Mod assist;+2 safety/equipment         General transfer comment: cues for transition position and use of UEs to self assist  Ambulation/Gait Ambulation/Gait assistance: Min assist;Mod assist;+2 safety/equipment Ambulation Distance (Feet): 28 Feet Assistive device: Rolling walker (2 wheeled) Gait Pattern/deviations: Step-through pattern;Decreased step length - right;Decreased step length - left;Shuffle;Trunk flexed Gait velocity: decr   General Gait Details: cues for posture and position from RW.  Multiple short standing rests 2* SOB but maintained SaO2 at 90% or higher on 6L O2  Stairs            Wheelchair Mobility    Modified Rankin (Stroke Patients Only)       Balance Overall balance assessment: Needs  assistance Sitting-balance support: No upper extremity supported;Feet supported Sitting balance-Leahy Scale: Good     Standing balance support: Bilateral upper extremity supported Standing balance-Leahy Scale: Poor                               Pertinent Vitals/Pain Pain Assessment: No/denies pain    Home Living Family/patient expects to be discharged to:: Unsure Living Arrangements: Children               Additional Comments: Pt lives with disabled son who is unable to provide assist    Prior Function Level of Independence: Independent with assistive device(s)         Comments: pt states he used cane or RW and was I with ADLs; has meals on wheels; neighbor helps with his son and as much as possible around the house  Pt does not drive     Hand Dominance   Dominant Hand: Right    Extremity/Trunk Assessment   Upper Extremity Assessment: Generalized weakness           Lower Extremity Assessment: Generalized weakness      Cervical / Trunk Assessment: Kyphotic  Communication   Communication: HOH  Cognition Arousal/Alertness: Awake/alert Behavior During Therapy: WFL for tasks assessed/performed Overall Cognitive Status: Within Functional Limits for tasks assessed                      General Comments      Exercises  Assessment/Plan    PT Assessment Patient needs continued PT services  PT Diagnosis Difficulty walking   PT Problem List Decreased strength;Decreased activity tolerance;Decreased balance;Decreased mobility;Decreased knowledge of use of DME;Decreased safety awareness  PT Treatment Interventions DME instruction;Gait training;Functional mobility training;Therapeutic activities;Therapeutic exercise;Patient/family education   PT Goals (Current goals can be found in the Care Plan section) Acute Rehab PT Goals Patient Stated Goal: Regain as much IND as possible PT Goal Formulation: With patient Time For Goal  Achievement: 05/17/16 Potential to Achieve Goals: Fair    Frequency Min 3X/week   Barriers to discharge Decreased caregiver support Home with disabled son and relies on neighbor for assistance    Co-evaluation               End of Session Equipment Utilized During Treatment: Gait belt;Oxygen Activity Tolerance: Patient limited by fatigue Patient left: in chair;with call bell/phone within reach;with chair alarm set Nurse Communication: Mobility status         Time: [id number] PT Time Calculation (min) (ACUTE ONLY): 23 min   Charges:   PT Evaluation $PT Eval Low Complexity: 1 Procedure PT Treatments $Gait Training: 8-22 mins   PT G Codes:        Mireya Meditz 2016-05-05, 2:50 PM

## 2016-05-03 NOTE — Progress Notes (Signed)
Increased FIO2 6L [place] due to  Low sats

## 2016-05-03 NOTE — Progress Notes (Signed)
[facility] Progress Note  Patient: Sean Figueroa [id number]   PCP: [name], MD DOB: 1933-09-09   DOA: 05/01/2016   DOS: 05/03/2016   Date of Service: the patient was seen and examined on 05/03/2016  Subjective: The patient mentions his breathing is better. Denies any chest pain or abdominal pain. Tolerating oral diet. Nutrition: Tolerating oral diet  Brief hospital course: Pt. with PMH of adenocarcinoma of the esophagus, chronic CHF, COPD, on hospice; admitted on 05/01/2016, with complaint of unresponsive event with hypoxia, was found to have COPD exacerbation and dehydration. Currently further plan is continue antibiotics and monitor for improvement with plan to discharge to SNF with palliative care support  Assessment and Plan: 1. Acute on chronic respiratory failure with hypercapnia (HCC) COPD exacerbation. Currently significantly improving. Possibility of medication side effect cannot be ruled out and is also likely. Patient was placed on BiPAP on admission as well as IV antibiotics. Currently significantly improving. Continue antibiotics at present. Give 40 mg of IV Lasix. Repeat chest x-ray shows mild congestion.  2. Severe COPD. Next and chronic respiratory failure with hypoxia. Adenocarcinoma of the esophagus. On hospice. Palliative care was consulted on admission. Currently as per recommendation from palliative care the plan is to go SNF with palliative care and hospice.  3. Coronary artery disease. Continuing home medications.  4. GERD. Continuing PPI.  Pain management: Resume when necessary Norco Activity: On hospice at present Bowel regimen: last BM prior to admission, stool softeners added Diet: Cardiac diet DVT Prophylaxis: subcutaneous Heparin  Advance goals of care discussion: DNR/DNI, on hospice  Family Communication: no family was present at bedside, at the time of interview.   Disposition:  Discharge to SNF with palliative care. Expected  discharge date: 06/17-18/2017  Consultants: Palliative care Procedures: None  Antibiotics: Anti-infectives    Start     Dose/Rate Route Frequency Ordered Stop   05/03/16 1200  amoxicillin-clavulanate (AUGMENTIN) 500-125 MG per tablet 500 mg     500 mg Oral 2 times daily 05/03/16 1122     05/01/16 1200  vancomycin (VANCOCIN) IVPB 1000 mg/200 mL premix  Status:  Discontinued     1,000 mg 200 mL/hr over 60 Minutes Intravenous Every 12 hours 05/01/16 1115 05/03/16 1122   05/01/16 1200  piperacillin-tazobactam (ZOSYN) IVPB 3.375 g  Status:  Discontinued     3.375 g 12.5 mL/hr over 240 Minutes Intravenous Every 8 hours 05/01/16 1115 05/03/16 1122   05/01/16 0745  piperacillin-tazobactam (ZOSYN) IVPB 3.375 g  Status:  Discontinued     3.375 g 100 mL/hr over 30 Minutes Intravenous STAT 05/01/16 0741 05/01/16 0753   05/01/16 0745  vancomycin (VANCOCIN) IVPB 1000 mg/200 mL premix  Status:  Discontinued     1,000 mg 200 mL/hr over 60 Minutes Intravenous STAT 05/01/16 0742 05/01/16 0753        Intake/Output Summary (Last 24 hours) at 05/03/16 1635 Last data filed at 05/03/16 1431  Gross per 24 hour  Intake    880 ml  Output   1800 ml  Net   -920 ml   Filed Weights   05/01/16 0747 05/01/16 1000  Weight: 67.132 kg (148 lb) 78.7 kg (173 lb 8 oz)    Objective: Physical Exam: Filed Vitals:   05/03/16 1006 05/03/16 1424 05/03/16 1551 05/03/16 1557  BP:  126/71    Pulse:  93    Temp:  98.1 F (36.7 C)    TempSrc:  Oral    Resp:  20    Height:  Weight:      SpO2: 94% 92% 85% 91%    General: Alert, Awake and Oriented to Time, Place and Person. Appear in mild distress Eyes: PERRL, Conjunctiva normal ENT: Oral Mucosa clear moist. Neck: no JVD, no Abnormal Mass Or lumps Cardiovascular: S1 and S2 Present, no Murmur, Respiratory: Bilateral Air entry equal and Decreased, basal Crackles, bilateral wheezes Abdomen: Bowel Sound present, Soft and no tenderness Skin: no redness, no  Rash  Extremities: no Pedal edema, no calf tenderness Neurologic: Grossly no focal neuro deficit. Bilaterally Equal motor strength  Data Reviewed: CBC:  Recent Labs Lab 05/01/16 0735 05/02/16 0250  WBC 15.0* 14.2*  NEUTROABS 12.7*  --   HGB 13.1 11.1*  HCT 41.6 34.3*  MCV 108.6* 104.9*  PLT 165 123456   Basic Metabolic Panel:  Recent Labs Lab 05/01/16 0735 05/01/16 1140 05/02/16 0250  NA 140  --  137  K 4.8  --  4.2  CL 95*  --  94*  CO2 42*  --  36*  GLUCOSE 206*  --  273*  BUN 30*  --  34*  CREATININE 0.74  --  0.85  CALCIUM 8.8*  --  8.5*  MG  --  2.5*  --   PHOS  --  5.0*  --     Liver Function Tests:  Recent Labs Lab 05/01/16 0735  AST 30  ALT 63  ALKPHOS 116  BILITOT 0.6  PROT 6.7  ALBUMIN 3.6   No results for input(s): LIPASE, AMYLASE in the last 168 hours.  Recent Labs Lab 05/01/16 0735 05/01/16 1140  AMMONIA 19 73*   Coagulation Profile: No results for input(s): INR, PROTIME in the last 168 hours. Cardiac Enzymes:  Recent Labs Lab 05/01/16 1140 05/01/16 1650 05/01/16 2248  TROPONINI 0.05* 0.05* 0.09*   BNP (last 3 results) No results for input(s): PROBNP in the last 8760 hours.  CBG: No results for input(s): GLUCAP in the last 168 hours.  Studies: No results found.   Scheduled Meds: . amoxicillin-clavulanate  500 mg Oral BID  . aspirin EC  81 mg Oral Daily  . budesonide (PULMICORT) nebulizer solution  0.25 mg Nebulization BID  . famotidine  20 mg Oral BID  . ipratropium-albuterol  3 mL Nebulization TID  . metoprolol tartrate  12.5 mg Oral BID  . predniSONE  10 mg Oral 3 x daily with food  . [START ON 05/04/2016] predniSONE  10 mg Oral 4X daily taper  . predniSONE  20 mg Oral Nightly  . rosuvastatin  10 mg Oral Daily   Continuous Infusions:  PRN Meds: ipratropium-albuterol, nitroGLYCERIN  Time spent: 30 minutes  Author: Berle Mull, MD [facility] Pager: [phone] 05/03/2016 4:35 PM  If 7PM-7AM, please  contact night-coverage at [email or web address], password [email or web address]

## 2016-05-03 NOTE — Progress Notes (Signed)
RT came to do neb tx with pt- pt sats low.

## 2016-05-03 NOTE — Clinical Social Work Note (Signed)
Clinical Social Work Assessment  Patient Details  Name: Sean Figueroa MRN: [id number] Date of Birth: 04-02-1933  Date of referral:  05/03/16               Reason for consult:  Facility Placement, Discharge Planning                Permission sought to share information with:  Chartered certified accountant granted to share information::  Yes, Verbal Permission Granted  Name::        Agency::     Relationship::     Contact Information:     Housing/Transportation Living arrangements for the past 2 months:  [place] of Information:  Facility, Engineer, materials (POA) Patient Interpreter Needed:  None Criminal Activity/Legal Involvement Pertinent to Current Situation/Hospitalization:  No - Comment as needed Significant Relationships:  Adult Children Lives with:  Self Do you feel safe going back to the place where you live?  No Need for family participation in patient care:  No (Coment)  Care giving concerns:  Pt's care cannot be managed at home following hospital d/c.   Social Worker assessment / plan:  Pt hospitalized on 05/01/16 with acute respiratory failure. Pt has been followed at home by hospice. CSW met with pt / POA Janese Banks [phone] to assist with d/c planning. PT has recommended SNF placement at d/c. Pt / POA are in agreement with this plan. POA reports that placement at [facility] was initiated prior to hospital admission. SNF contacted and clinicals sent for review. Awaiting confirmation regarding d/c plan. CSW will continue to follow to assist with d/c planning needs.  Employment status:  Retired Forensic scientist:  [employer] PT Recommendations:  [place] / Referral to community resources:  [place] (hospice)  Patient/Family's Response to care:  Pt / POA feel SNF placement is needed.  Patient/Family's Understanding of and Emotional Response to Diagnosis, Current Treatment, and  Prognosis:  MD has updated pt / POA regarding medical status. Pt feels SNF placement is needed. Pt is concerned about his adult mentally disabled son that lives with pt. POA has been provided with DSS / APS telephone # to request assistance placing pt's son. POA reports that he is unable to take on long term responsibility for pt's son.   Emotional Assessment Appearance:  Appears stated age Attitude/Demeanor/Rapport:  Other (cooperative) Affect (typically observed):  Calm, Appropriate Orientation:  Oriented to Self, Oriented to Place Alcohol / Substance use:  Not Applicable Psych involvement (Current and /or in the community):  No (Comment)  Discharge Needs  Concerns to be addressed:  Discharge Planning Concerns Readmission within the last 30 days:  No Current discharge risk:  None Barriers to Discharge:  No Barriers Identified   Luretha Rued, [place] 05/03/2016, 2:10 PM

## 2016-05-03 NOTE — NC FL2 (Signed)
[place] MEDICAID FL2 LEVEL OF CARE SCREENING TOOL     IDENTIFICATION  Patient Name: Sean Figueroa Birthdate: 1933-04-10 Sex: male Admission Date (Current Location): 05/01/2016  [facility] and [place] Number:  Herbalist and Address:  [facility],  [place] [address], [place]      Provider Number: [id number]  Attending Physician Name and Address:  Lavina Hamman, MD  Relative Name and Phone Number:       Current Level of Care: Hospital Recommended Level of Care: [place] Prior Approval Number:    Date Approved/Denied:   PASRR Number: [id number]  Discharge Plan: SNF    Current Diagnoses: Patient Active Problem List   Diagnosis Date Noted  . Palliative care status   . Dyspnea   . Goals of care, counseling/discussion   . Palliative care encounter   . Malignant neoplasm of lower third of esophagus ([place])   . Esophageal mass   . Dysphagia   . Abnormal finding on imaging   . GI bleed 12/24/2015  . GERD (gastroesophageal reflux disease) 12/24/2015  . Nausea & vomiting 12/24/2015  . Leukocytosis 12/24/2015  . Chronic venous insufficiency 10/09/2015  . Benign hypertensive heart disease without heart failure   . COPD exacerbation ([place])   . Acute respiratory failure with hypercapnia ([place])   . Esophageal reflux   . Hypercapnic respiratory failure ([place]) 09/21/2015  . Chronic systolic heart failure ([place]) 09/21/2015  . Acute on chronic respiratory failure with hypoxia and hypercapnia ([place]) 09/21/2015  . Acute on chronic respiratory failure with hypercapnia ([place]) 09/21/2015  . Chronic respiratory failure with hypoxia ([place]) 04/03/2015  . COPD, severe ([place]) 02/16/2015  . Physical deconditioning 02/16/2015  . Smoking 02/16/2015  . Dizziness 12/21/2014  . Congestive heart disease ([place])   . COLD (chronic obstructive lung disease) ([place]) 09/09/2014  . PAF (paroxysmal atrial fibrillation) ([place]) 03/24/2012  . CAD (coronary artery  disease) 02/03/2012  . Acute on chronic systolic heart failure ([place]) 01/20/2012  . Hypokalemia 01/15/2012  . Intermediate coronary syndrome ([place]) 01/14/2012  . Apical myocardial infarction greater than eight weeks ago 02/27/2011  . COPD (chronic obstructive pulmonary disease) ([place]) 02/27/2011  . Tobacco abuse 02/27/2011  . Hypercholesterolemia 02/27/2011  . Osteoarthritis 02/27/2011    Orientation RESPIRATION BLADDER Height & Weight     Place, Self  O2 Continent Weight: 78.7 kg (173 lb 8 oz) Height:  5\' 7"  (170.2 cm)  BEHAVIORAL SYMPTOMS/MOOD NEUROLOGICAL BOWEL NUTRITION STATUS  Other (Comment) (no behaviors)   Continent Diet  AMBULATORY STATUS COMMUNICATION OF NEEDS Skin   Extensive Assist Verbally Normal                       Personal Care Assistance Level of Assistance  Bathing, Feeding, Dressing Bathing Assistance: Limited assistance Feeding assistance: Independent Dressing Assistance: Limited assistance     Functional Limitations Info  Sight, Hearing, Speech Sight Info: Adequate Hearing Info: Adequate Speech Info: Adequate    SPECIAL CARE FACTORS FREQUENCY  PT (By licensed PT), OT (By licensed OT)     PT Frequency: 5 x wk OT Frequency: 5 x wk            Contractures Contractures Info: Not present    Additional Factors Info  Allergies, Code Status Code Status Info: DNR             Current Medications (05/03/2016):  This is the current hospital active medication list Current Facility-Administered Medications  Medication Dose  Route Frequency Provider Last Rate Last Dose  . amoxicillin-clavulanate (AUGMENTIN) 500-125 MG per tablet 500 mg  500 mg Oral BID Lavina Hamman, MD   500 mg at 05/03/16 1136  . aspirin EC tablet 81 mg  81 mg Oral Daily Bonnell Public, MD   81 mg at 05/03/16 0948  . budesonide (PULMICORT) nebulizer solution 0.25 mg  0.25 mg Nebulization BID Bonnell Public, MD   0.25 mg at 05/03/16 1006  . famotidine (PEPCID) tablet 20  mg  20 mg Oral BID Darl Pikes, RPH   20 mg at 05/03/16 0951  . ipratropium-albuterol (DUONEB) 0.5-2.5 (3) MG/3ML nebulizer solution 3 mL  3 mL Nebulization Q4H PRN Bonnell Public, MD   3 mL at 05/03/16 0556  . ipratropium-albuterol (DUONEB) 0.5-2.5 (3) MG/3ML nebulizer solution 3 mL  3 mL Nebulization TID Lavina Hamman, MD   3 mL at 05/03/16 1006  . metoprolol tartrate (LOPRESSOR) tablet 12.5 mg  12.5 mg Oral BID Bonnell Public, MD   12.5 mg at 05/03/16 0948  . nitroGLYCERIN (NITROSTAT) SL tablet 0.4 mg  0.4 mg Sublingual Q5 min PRN Bonnell Public, MD      . predniSONE (STERAPRED UNI-PAK 21 TAB) tablet 10 mg  10 mg Oral 3 x daily with food Lavina Hamman, MD   10 mg at 05/03/16 0930  . [START ON 05/04/2016] predniSONE (STERAPRED UNI-PAK 21 TAB) tablet 10 mg  10 mg Oral 4X daily taper Lavina Hamman, MD      . predniSONE (STERAPRED UNI-PAK 21 TAB) tablet 20 mg  20 mg Oral Nightly Lavina Hamman, MD      . rosuvastatin (CRESTOR) tablet 10 mg  10 mg Oral Daily Bonnell Public, MD   10 mg at 05/03/16 [id number]     Discharge Medications: Please see discharge summary for a list of discharge medications.  Relevant Imaging Results:  Relevant Lab Results:   Additional Information ss # [id number]  Airiana Elman, Randall An, LCSW

## 2016-05-04 DIAGNOSIS — J9622 Acute and chronic respiratory failure with hypercapnia: Principal | ICD-10-CM

## 2016-05-04 LAB — COMPREHENSIVE METABOLIC PANEL
ALBUMIN: 2.9 g/dL — AB (ref 3.5–5.0)
ALK PHOS: 89 U/L (ref 38–126)
ALT: 55 U/L (ref 17–63)
ANION GAP: 8 (ref 5–15)
AST: 32 U/L (ref 15–41)
BILIRUBIN TOTAL: 0.7 mg/dL (ref 0.3–1.2)
BUN: 36 mg/dL — ABNORMAL HIGH (ref 6–20)
CALCIUM: 8.8 mg/dL — AB (ref 8.9–10.3)
CO2: 35 mmol/L — ABNORMAL HIGH (ref 22–32)
Chloride: 97 mmol/L — ABNORMAL LOW (ref 101–111)
Creatinine, Ser: 0.84 mg/dL (ref 0.61–1.24)
GFR calc Af Amer: >60 mL/min (ref >60)
GLUCOSE: 243 mg/dL — AB (ref 65–99)
Potassium: 3.9 mmol/L (ref 3.5–5.1)
Sodium: 140 mmol/L (ref 135–145)
TOTAL PROTEIN: 6 g/dL — AB (ref 6.5–8.1)

## 2016-05-04 LAB — CBC WITH DIFFERENTIAL/PLATELET
BASOS PCT: 0 %
Basophils Absolute: 0 10*3/uL (ref 0.0–0.1)
Eosinophils Absolute: 0 10*3/uL (ref 0.0–0.7)
Eosinophils Relative: 0 %
HEMATOCRIT: 36.3 % — AB (ref 39.0–52.0)
HEMOGLOBIN: 12.1 g/dL — AB (ref 13.0–17.0)
LYMPHS ABS: 0.3 10*3/uL — AB (ref 0.7–4.0)
LYMPHS PCT: 2 %
MCH: 34 pg (ref 26.0–34.0)
MCHC: 33.3 g/dL (ref 30.0–36.0)
MCV: 102 fL — AB (ref 78.0–100.0)
MONO ABS: 0.6 10*3/uL (ref 0.1–1.0)
MONOS PCT: 4 %
NEUTROS ABS: 15.2 10*3/uL — AB (ref 1.7–7.7)
NEUTROS PCT: 94 %
Platelets: 153 10*3/uL (ref 150–400)
RBC: 3.56 MIL/uL — ABNORMAL LOW (ref 4.22–5.81)
RDW: 15.1 % (ref 11.5–15.5)
WBC: 16 10*3/uL — ABNORMAL HIGH (ref 4.0–10.5)

## 2016-05-04 LAB — PROTIME-INR
INR: 1.19 (ref 0.00–1.49)
PROTHROMBIN TIME: 14.8 s (ref 11.6–15.2)

## 2016-05-04 LAB — MAGNESIUM: MAGNESIUM: 2.3 mg/dL (ref 1.7–2.4)

## 2016-05-04 MED ORDER — FUROSEMIDE 20 MG PO TABS
20.0000 mg | ORAL_TABLET | Freq: Every day | ORAL | Status: DC
  Administered 2016-05-05 – 2016-05-06 (×2): 20 mg via ORAL
  Filled 2016-05-04 (×2): qty 1

## 2016-05-04 MED ORDER — MORPHINE SULFATE 10 MG/5ML PO SOLN
2.5000 mg | ORAL | Status: DC | PRN
Start: 2016-05-04 — End: 2016-05-06
  Administered 2016-05-05: 2.5 mg via ORAL
  Filled 2016-05-04: qty 5

## 2016-05-04 NOTE — Progress Notes (Signed)
[facility] Progress Note  Patient: Sean Figueroa [id number]   PCP: [name], MD DOB: [birth year]   DOA: 05/01/2016   DOS: 05/04/2016   Date of Service: the patient was seen and examined on 05/04/2016  Subjective: Patient is confused and lethargic. Patient is telling me that he is seeing some stuff on TV and point to a wall without TV. Patient is also telling me that people are trying to kill him. Nutrition: Tolerating oral diet  Brief hospital course: Pt. with PMH of adenocarcinoma of the esophagus, chronic CHF, COPD, on hospice; admitted on 05/01/2016, with complaint of unresponsive event with hypoxia, was found to have COPD exacerbation and dehydration. On 05/04/2078 discussed with patient's power of attorney at bedside and felt that with worsening mentation patient would be best treated at inpatient hospice facility. Currently further plan is transition patient's care to inpatient hospice  Assessment and Plan: 1. Acute on chronic respiratory failure with hypercapnia (HCC) COPD exacerbation. Initially on admission the patient was unresponsive and showed significant improvement in mentation after being on BiPAP and antibiotics.  Despite continues use of antibiotic as well as steroids patient continues to show worsening mentation on 05/04/2016. Discussed with power of attorney and felt that the patient would be an appropriate candidate for inpatient hospice. Discussed with palliative care who agrees with the plan. Social worker consulted for transitioning the patient to inpatient hospice.  Continue antibiotics at present.  2. Severe COPD. and chronic respiratory failure with hypoxia. Adenocarcinoma of the esophagus. On hospice. Palliative care was consulted on admission. I will add when necessary morphine. Low threshold to escalate comfort measures. Currently the plan is to transition the care to inpatient hospice. Long-term prognosis is limited and actual prognosis would be  less than 2 weeks.  3. Coronary artery disease. Continuing home medications.  4. GERD. Continuing PPI.  Pain management: Resume when necessary Norco Activity: On hospice at present Bowel regimen: last BM 05/04/2016, stool softeners added Diet: Cardiac diet DVT Prophylaxis: subcutaneous Heparin  Advance goals of care discussion: DNR/DNI, on hospice  Family Communication: family was present at bedside, at the time of interview.   Disposition:  Discharge to inpatient hospice Expected discharge date: 05/05/2016  Consultants: Palliative care Procedures: None  Antibiotics: Anti-infectives    Start     Dose/Rate Route Frequency Ordered Stop   05/03/16 1200  amoxicillin-clavulanate (AUGMENTIN) 500-125 MG per tablet 500 mg     500 mg Oral 2 times daily 05/03/16 1122     05/01/16 1200  vancomycin (VANCOCIN) IVPB 1000 mg/200 mL premix  Status:  Discontinued     1,000 mg 200 mL/hr over 60 Minutes Intravenous Every 12 hours 05/01/16 1115 05/03/16 1122   05/01/16 1200  piperacillin-tazobactam (ZOSYN) IVPB 3.375 g  Status:  Discontinued     3.375 g 12.5 mL/hr over 240 Minutes Intravenous Every 8 hours 05/01/16 1115 05/03/16 1122   05/01/16 0745  piperacillin-tazobactam (ZOSYN) IVPB 3.375 g  Status:  Discontinued     3.375 g 100 mL/hr over 30 Minutes Intravenous STAT 05/01/16 0741 05/01/16 0753   05/01/16 0745  vancomycin (VANCOCIN) IVPB 1000 mg/200 mL premix  Status:  Discontinued     1,000 mg 200 mL/hr over 60 Minutes Intravenous STAT 05/01/16 0742 05/01/16 0753        Intake/Output Summary (Last 24 hours) at 05/04/16 1822 Last data filed at 05/04/16 1327  Gross per 24 hour  Intake   1200 ml  Output   1726 ml  Net   -  526 ml   Filed Weights   05/01/16 0747 05/01/16 1000  Weight: 67.132 kg (148 lb) 78.7 kg (173 lb 8 oz)    Objective: Physical Exam: Filed Vitals:   05/04/16 0532 05/04/16 0912 05/04/16 1326 05/04/16 1447  BP: 139/100  128/63   Pulse: 92  92   Temp: 97.9  F (36.6 C)  98.1 F (36.7 C)   TempSrc: Oral  Oral   Resp: 24  20   Height:      Weight:      SpO2: 90% 93% 96% 95%   General: Alert, Awake and Oriented to Person. Appear in Moderate distress Eyes: PERRL, Conjunctiva normal ENT: Oral Mucosa clear moist. Neck: no JVD, no Abnormal Mass Or lumps Cardiovascular: S1 and S2 Present, no Murmur, Respiratory: Bilateral Air entry equal and Decreased, basal Crackles, bilateral wheezes Abdomen: Bowel Sound present, Soft and no tenderness Skin: no redness, no Rash  Extremities: no Pedal edema, no calf tenderness Neurologic: Grossly no focal neuro deficit. Bilaterally Equal motor strength  Data Reviewed: CBC:  Recent Labs Lab 05/01/16 0735 05/02/16 0250 05/04/16 0405  WBC 15.0* 14.2* 16.0*  NEUTROABS 12.7*  --  15.2*  HGB 13.1 11.1* 12.1*  HCT 41.6 34.3* 36.3*  MCV 108.6* 104.9* 102.0*  PLT 165 154 0000000   Basic Metabolic Panel:  Recent Labs Lab 05/01/16 0735 05/01/16 1140 05/02/16 0250 05/04/16 0405  NA 140  --  137 140  K 4.8  --  4.2 3.9  CL 95*  --  94* 97*  CO2 42*  --  36* 35*  GLUCOSE 206*  --  273* 243*  BUN 30*  --  34* 36*  CREATININE 0.74  --  0.85 0.84  CALCIUM 8.8*  --  8.5* 8.8*  MG  --  2.5*  --  2.3  PHOS  --  5.0*  --   --     Liver Function Tests:  Recent Labs Lab 05/01/16 0735 05/04/16 0405  AST 30 32  ALT 63 55  ALKPHOS 116 89  BILITOT 0.6 0.7  PROT 6.7 6.0*  ALBUMIN 3.6 2.9*   No results for input(s): LIPASE, AMYLASE in the last 168 hours.  Recent Labs Lab 05/01/16 0735 05/01/16 1140  AMMONIA 19 73*   Coagulation Profile:  Recent Labs Lab 05/04/16 0405  INR 1.19   Cardiac Enzymes:  Recent Labs Lab 05/01/16 1140 05/01/16 1650 05/01/16 2248  TROPONINI 0.05* 0.05* 0.09*   BNP (last 3 results) No results for input(s): PROBNP in the last 8760 hours.  CBG: No results for input(s): GLUCAP in the last 168 hours.  Studies: No results found.   Scheduled Meds: .  amoxicillin-clavulanate  500 mg Oral BID  . antiseptic oral rinse  7 mL Mouth Rinse q12n4p  . aspirin EC  81 mg Oral Daily  . budesonide (PULMICORT) nebulizer solution  0.25 mg Nebulization BID  . chlorhexidine  15 mL Mouth Rinse BID  . famotidine  20 mg Oral BID  . ipratropium-albuterol  3 mL Nebulization TID  . metoprolol tartrate  12.5 mg Oral BID  . predniSONE  10 mg Oral 4X daily taper  . rosuvastatin  10 mg Oral Daily   Continuous Infusions:  PRN Meds: ipratropium-albuterol, nitroGLYCERIN  Time spent: 30 minutes  Author: Berle Mull, MD [facility] Pager: [phone] 05/04/2016 6:22 PM  If 7PM-7AM, please contact night-coverage at [email or web address], password [facility]

## 2016-05-04 NOTE — Progress Notes (Signed)
Call received and discussed in detail with Dr Posey Pronto.  Please see initial consult note from time of admission.  Patient with tenuous respiratory status, has serious life limiting conditions.  As far as disposition, it appears that residential hospice to continue to focus on comfort measures seems most appropriate Estimated prognosis could be as short as 2-3 weeks in my opinion  Loistine Chance MD [place] team [phone]

## 2016-05-05 MED ORDER — HALOPERIDOL LACTATE 5 MG/ML IJ SOLN: 2.0000 mg | Freq: Four times a day (QID) | INTRAMUSCULAR | Status: DC | PRN

## 2016-05-05 NOTE — Progress Notes (Signed)
RT attempted to administer scheduled breath tx, however, patient became highly agitated and did not "want to be messed with."  When confirming that the patient did not want his breathing treatments he responded again that he did not want to me messed with, and he would "start something" if this continued.  RT wished patient a good night and left the room.  Bed noted to be in lowest position with bed rails in place.

## 2016-05-05 NOTE — Progress Notes (Signed)
Daily Progress Note   Patient Name: Sean Figueroa       Date: 05/05/2016 DOB: 1933/08/18  Age: 82 y.o. MRN#: [id number] Attending Physician: Lavina Hamman, MD Primary Care Physician: [name], MD Admit Date: 05/01/2016  Reason for Consultation/Follow-up: Terminal Care  Subjective:  resting in chair, ate some breakfast Confused, does not know he is in hospital, does not know why he is in hospital Patient keeps on saying that he has elected his friend Jenny Reichmann to handle things for him Does not appear dyspneic.   Length of Stay: 4  Current Medications: Scheduled Meds:  . amoxicillin-clavulanate  500 mg Oral BID  . antiseptic oral rinse  7 mL Mouth Rinse q12n4p  . aspirin EC  81 mg Oral Daily  . budesonide (PULMICORT) nebulizer solution  0.25 mg Nebulization BID  . chlorhexidine  15 mL Mouth Rinse BID  . famotidine  20 mg Oral BID  . furosemide  20 mg Oral Daily  . ipratropium-albuterol  3 mL Nebulization TID  . metoprolol tartrate  12.5 mg Oral BID  . predniSONE  10 mg Oral 4X daily taper  . rosuvastatin  10 mg Oral Daily    Continuous Infusions:    PRN Meds: ipratropium-albuterol, morphine, nitroGLYCERIN  Physical Exam         Confused Chronically ill appearing S1 S2 Diminished bases Edema Redness worse LLE than RLE  Vital Signs: BP 131/75 mmHg  Pulse 100  Temp(Src) 97.9 F (36.6 C) (Oral)  Resp 22  Ht 5\' 7"  (1.702 m)  Wt 78.7 kg (173 lb 8 oz)  BMI 26.39 kg/m2  SpO2 90% SpO2: SpO2: 90 % O2 Device: O2 Device: Nasal Cannula O2 Flow Rate: O2 Flow Rate (L/min): 6 L/min  Intake/output summary:  Intake/Output Summary (Last 24 hours) at 05/05/16 1007 Last data filed at 05/05/16 [id number]  Gross per 24 hour  Intake    980 ml  Output    400 ml  Net    580 ml   LBM:  Last BM Date: 05/04/16 Baseline Weight: Weight: 67.132 kg (148 lb) Most recent weight: Weight: 78.7 kg (173 lb 8 oz)       Palliative Assessment/Data:    Flowsheet Rows        Most Recent Value   Intake Tab    Referral Department  Hospitalist   Unit at Time of Referral  Oncology Unit   Palliative Care Primary Diagnosis  Cancer   Date Notified  05/01/16   Palliative Care Type  Return patient Palliative Care   Reason for referral  Non-pain Symptom, End of Life Care Assistance   Date of Admission  05/01/16   Date first seen by Palliative Care  05/01/16   # of days Palliative referral response time  0 Day(s)   # of days IP prior to Palliative referral  0   Clinical Assessment    Palliative Performance Scale Score  30%   Pain Max last 24 hours  7   Pain Min Last 24 hours  4   Dyspnea Max Last 24 Hours  6   Dyspnea Min Last 24 hours  4   Psychosocial & Spiritual Assessment    Palliative Care Outcomes    Patient/Family meeting held?  Yes   Who was at the meeting?  [place] over the phone.    Palliative Care follow-up planned  Yes, Facility      Patient Active Problem List   Diagnosis Date Noted  . Palliative care status   . Dyspnea   . Goals of care, counseling/discussion   . Palliative care encounter   . Malignant neoplasm of lower third of esophagus ([place])   . Esophageal mass   . Dysphagia   . Abnormal finding on imaging   . GI bleed 12/24/2015  . GERD (gastroesophageal reflux disease) 12/24/2015  . Nausea & vomiting 12/24/2015  . Leukocytosis 12/24/2015  . Chronic venous insufficiency 10/09/2015  . Benign hypertensive heart disease without heart failure   . COPD exacerbation ([place])   . Acute respiratory failure with hypercapnia ([place])   . Esophageal reflux   . Hypercapnic respiratory failure ([place]) 09/21/2015  . Chronic systolic heart failure ([place]) 09/21/2015  . Acute on chronic respiratory failure with hypoxia and hypercapnia ([place]) 09/21/2015  . Acute on  chronic respiratory failure with hypercapnia ([place]) 09/21/2015  . Chronic respiratory failure with hypoxia ([place]) 04/03/2015  . COPD, severe ([place]) 02/16/2015  . Physical deconditioning 02/16/2015  . Smoking 02/16/2015  . Dizziness 12/21/2014  . Congestive heart disease ([place])   . COLD (chronic obstructive lung disease) ([place]) 09/09/2014  . PAF (paroxysmal atrial fibrillation) ([place]) 03/24/2012  . CAD (coronary artery disease) 02/03/2012  . Acute on chronic systolic heart failure ([place]) 01/20/2012  . Hypokalemia 01/15/2012  . Intermediate coronary syndrome ([place]) 01/14/2012  . Apical myocardial infarction greater than eight weeks ago 02/27/2011  . COPD (chronic obstructive pulmonary disease) ([place]) 02/27/2011  . Tobacco abuse 02/27/2011  . Hypercholesterolemia 02/27/2011  . Osteoarthritis 02/27/2011    Palliative Care Assessment & Plan   Patient Profile:    Assessment:  Pt. with PMH of adenocarcinoma of the esophagus, chronic CHF, COPD, on hospice; admitted on 05/01/2016, with complaint of unresponsive event with hypoxia, was found to have COPD exacerbation and dehydration.    Recommendations/Plan:   recommend inpatient hospice for symptom management of likely hypoxia related confusion.    Code Status:    Code Status Orders        Start     Ordered   05/01/16 1041  Do not attempt resuscitation (DNR)   Continuous    Question Answer Comment  In the event of cardiac or respiratory ARREST Do not call a "code blue"   In the event of cardiac or respiratory ARREST Do not perform Intubation, CPR, defibrillation or ACLS  In the event of cardiac or respiratory ARREST Use medication by any route, position, wound care, and other measures to relive pain and suffering. May use oxygen, suction and manual treatment of airway obstruction as needed for comfort.      05/01/16 1048    Code Status History    Date Active Date Inactive Code Status Order ID Comments User Context   12/29/2015 12:54 PM  01/01/2016  5:24 PM DNR [id number]  Knox Royalty, NP Inpatient   12/25/2015  5:41 AM 12/29/2015 12:54 PM Full Code [id number]  Mendel Corning, MD ED   09/21/2015  3:39 PM 09/24/2015  6:11 PM Full Code [id number]  Radene Gunning, NP Inpatient   10/06/2014  5:43 PM 10/08/2014  7:02 PM Full Code [id number]  Almyra Deforest, PA Inpatient    Advance Directive Documentation        Most Recent Value   Type of Advance Directive  Healthcare Power of Attorney, Living will   Pre-existing out of facility DNR order (yellow form or pink MOST form)     "MOST" Form in Place?         Prognosis:   2-3 weeks   Discharge Planning:  Hospice facility  Care plan was discussed with  patient  Thank you for allowing the Palliative Medicine Team to assist in the care of this patient.   Time In: 9 Time Out: 925 Total Time 25 Prolonged Time Billed  no       Greater than 50%  of this time was spent counseling and coordinating care related to the above assessment and plan.  Loistine Chance, MD [phone]  Please contact Palliative Medicine Team phone at [phone] for questions and concerns.

## 2016-05-05 NOTE — Progress Notes (Signed)
[facility] Progress Note  Patient: Sean Figueroa [id number]   PCP: [name], MD DOB: 1933/02/09   DOA: 05/01/2016   DOS: 05/05/2016   Date of Service: the patient was seen and examined on 05/05/2016  Subjective: The patient is less lethargic but remains confused. Denies any complaints of chest pain or shortness of breath. No nausea no vomiting. Nutrition: Tolerating oral diet  Brief hospital course: Pt. with PMH of adenocarcinoma of the esophagus, chronic CHF, COPD, on hospice; admitted on 05/01/2016, with complaint of unresponsive event with hypoxia, was found to have COPD exacerbation and dehydration. Patient was admitted to step down unit and was started on BiPAP as well as IV antibiotics. With the initial treatment patient didn't show some improvement but later on continued to have worsening mentation and confusion as well as respiratory distress. On 05/04/2078 discussed with patient's power of attorney at bedside and felt that with worsening mentation patient would be best treated at inpatient hospice facility. Currently further plan is transition patient's care to inpatient hospice  Assessment and Plan: 1. Acute on chronic respiratory failure with hypercapnia (HCC) COPD exacerbation. Initially on admission the patient was unresponsive and showed significant improvement in mentation after being on BiPAP and antibiotics.  Despite continues use of antibiotic as well as steroids patient continues to show worsening mentation starting 05/04/2016. Discussed with power of attorney and felt that the patient would be an appropriate candidate for inpatient hospice. Discussed with palliative care who agrees with the plan. Social worker consulted for transitioning the patient to inpatient hospice. Continue antibiotics at present.  2. Severe COPD. and chronic respiratory failure with hypoxia. Adenocarcinoma of the esophagus. On hospice. Palliative care was consulted on  admission. Continue when necessary morphine. Low threshold to escalate comfort measures. Currently the plan is to transition the care to inpatient hospice. Long-term prognosis is limited and actual prognosis would be less than 4weeks.  3. Coronary artery disease. Continuing home medications.  4. GERD. Continuing PPI.  Pain management: Resume when necessary Norco Activity: On hospice at present Bowel regimen: last BM 05/04/2016, stool softeners added Diet: Cardiac diet DVT Prophylaxis: subcutaneous Heparin  Advance goals of care discussion: DNR/DNI, on hospice  Family Communication: family was present at bedside, at the time of interview.   Disposition:  Discharge to inpatient hospice Expected discharge date: 05/06/2016 pending bed availability at inpatient hospice  Consultants: Palliative care Procedures: None  Antibiotics: Anti-infectives    Start     Dose/Rate Route Frequency Ordered Stop   05/03/16 1200  amoxicillin-clavulanate (AUGMENTIN) 500-125 MG per tablet 500 mg     500 mg Oral 2 times daily 05/03/16 1122     05/01/16 1200  vancomycin (VANCOCIN) IVPB 1000 mg/200 mL premix  Status:  Discontinued     1,000 mg 200 mL/hr over 60 Minutes Intravenous Every 12 hours 05/01/16 1115 05/03/16 1122   05/01/16 1200  piperacillin-tazobactam (ZOSYN) IVPB 3.375 g  Status:  Discontinued     3.375 g 12.5 mL/hr over 240 Minutes Intravenous Every 8 hours 05/01/16 1115 05/03/16 1122   05/01/16 0745  piperacillin-tazobactam (ZOSYN) IVPB 3.375 g  Status:  Discontinued     3.375 g 100 mL/hr over 30 Minutes Intravenous STAT 05/01/16 0741 05/01/16 0753   05/01/16 0745  vancomycin (VANCOCIN) IVPB 1000 mg/200 mL premix  Status:  Discontinued     1,000 mg 200 mL/hr over 60 Minutes Intravenous STAT 05/01/16 0742 05/01/16 0753        Intake/Output Summary (Last 24 hours) at  05/05/16 1918 Last data filed at 05/05/16 1842  Gross per 24 hour  Intake    860 ml  Output    400 ml  Net     460 ml   Filed Weights   05/01/16 0747 05/01/16 1000  Weight: 67.132 kg (148 lb) 78.7 kg (173 lb 8 oz)    Objective: Physical Exam: Filed Vitals:   05/05/16 0820 05/05/16 0826 05/05/16 1324 05/05/16 1337  BP:   137/89   Pulse:   87   Temp:   98.1 F (36.7 C)   TempSrc:   Oral   Resp:   16   Height:      Weight:      SpO2: 90% 90% 100% 94%   General: Alert, Awake and Oriented to Person only. Appear in Moderate distress Eyes: PERRL, Conjunctiva normal ENT: Oral Mucosa clear moist. Neck: no JVD, no Abnormal Mass Or lumps Cardiovascular: S1 and S2 Present, no Murmur, Respiratory: Bilateral Air entry equal and Decreased, basal Crackles, bilateral wheezes Abdomen: Bowel Sound present, Soft and no tenderness Skin: no redness, no Rash  Extremities: no Pedal edema, no calf tenderness Neurologic: Grossly no focal neuro deficit. Bilaterally Equal motor strength  Data Reviewed: CBC:  Recent Labs Lab 05/01/16 0735 05/02/16 0250 05/04/16 0405  WBC 15.0* 14.2* 16.0*  NEUTROABS 12.7*  --  15.2*  HGB 13.1 11.1* 12.1*  HCT 41.6 34.3* 36.3*  MCV 108.6* 104.9* 102.0*  PLT 165 154 0000000   Basic Metabolic Panel:  Recent Labs Lab 05/01/16 0735 05/01/16 1140 05/02/16 0250 05/04/16 0405  NA 140  --  137 140  K 4.8  --  4.2 3.9  CL 95*  --  94* 97*  CO2 42*  --  36* 35*  GLUCOSE 206*  --  273* 243*  BUN 30*  --  34* 36*  CREATININE 0.74  --  0.85 0.84  CALCIUM 8.8*  --  8.5* 8.8*  MG  --  2.5*  --  2.3  PHOS  --  5.0*  --   --     Liver Function Tests:  Recent Labs Lab 05/01/16 0735 05/04/16 0405  AST 30 32  ALT 63 55  ALKPHOS 116 89  BILITOT 0.6 0.7  PROT 6.7 6.0*  ALBUMIN 3.6 2.9*   No results for input(s): LIPASE, AMYLASE in the last 168 hours.  Recent Labs Lab 05/01/16 0735 05/01/16 1140  AMMONIA 19 73*   Coagulation Profile:  Recent Labs Lab 05/04/16 0405  INR 1.19   Cardiac Enzymes:  Recent Labs Lab 05/01/16 1140 05/01/16 1650  05/01/16 2248  TROPONINI 0.05* 0.05* 0.09*   BNP (last 3 results) No results for input(s): PROBNP in the last 8760 hours.  CBG: No results for input(s): GLUCAP in the last 168 hours.  Studies: No results found.   Scheduled Meds: . amoxicillin-clavulanate  500 mg Oral BID  . antiseptic oral rinse  7 mL Mouth Rinse q12n4p  . aspirin EC  81 mg Oral Daily  . budesonide (PULMICORT) nebulizer solution  0.25 mg Nebulization BID  . chlorhexidine  15 mL Mouth Rinse BID  . famotidine  20 mg Oral BID  . furosemide  20 mg Oral Daily  . ipratropium-albuterol  3 mL Nebulization TID  . metoprolol tartrate  12.5 mg Oral BID  . predniSONE  10 mg Oral 4X daily taper  . rosuvastatin  10 mg Oral Daily   Continuous Infusions:  PRN Meds: ipratropium-albuterol, morphine, nitroGLYCERIN  Time spent: 30  minutes  Author: Berle Mull, MD [facility] Pager: [phone] 05/05/2016 7:18 PM  If 7PM-7AM, please contact night-coverage at [email or web address], password [facility]

## 2016-05-05 NOTE — Clinical Social Work Note (Addendum)
CSW spoke to patient's POA Janese Banks [phone], patient's POA would like [facility] for residential placement.  CSW contacted [facility] who will review patient's information and determine if patient meets eligibility criteria and if beds are available.  CSW was informed by [facility], they will let CSW know on Monday about bed availability.  Weekday CSW to continue to follow patient's progress, throughout discharge planning.  Jones Broom. [place] Ch[place], MSW, [place] 05/05/2016 12:37 PM

## 2016-05-05 NOTE — Consult Note (Signed)
[facility] [employer]  Received request from [place] for patient interest in [facility]. Chart reviewed. Met briefly before speaking with [facility] by phone. Both confirm interest in [facility]. Clinical information to be reviewed and will get back to family and CSW.   Thank you. Erling Conte, [place]

## 2016-05-06 LAB — CULTURE, BLOOD (ROUTINE X 2)
CULTURE: NO GROWTH
CULTURE: NO GROWTH

## 2016-05-06 MED ORDER — LORAZEPAM 2 MG/ML IJ SOLN
0.5000 mg | Freq: Once | INTRAMUSCULAR | Status: AC
  Administered 2016-05-06: 0.5 mg via INTRAVENOUS
  Filled 2016-05-06: qty 1

## 2016-05-06 MED ORDER — BUDESONIDE 0.25 MG/2ML IN SUSP: 0.2500 mg | Freq: Two times a day (BID) | RESPIRATORY_TRACT | Status: AC

## 2016-05-06 MED ORDER — CETYLPYRIDINIUM CHLORIDE 0.05 % MT LIQD: 7.0000 mL | Freq: Two times a day (BID) | OROMUCOSAL | Status: AC

## 2016-05-06 MED ORDER — AMOXICILLIN-POT CLAVULANATE 500-125 MG PO TABS: 500.0000 mg | ORAL_TABLET | Freq: Two times a day (BID) | ORAL | Status: AC

## 2016-05-06 MED ORDER — FUROSEMIDE 20 MG PO TABS: 20.0000 mg | ORAL_TABLET | Freq: Every day | ORAL | Status: AC

## 2016-05-06 NOTE — Progress Notes (Signed)
Patient is set to discharge to [facility] today. Patient & neighbor at bedside made aware. Discharge packet given to RN, Beverlee Nims. [facility] called for transport.     Raynaldo Opitz, [facility] Clinical Social Worker cell #: [phone]

## 2016-05-06 NOTE — Discharge Instructions (Signed)
Acute Respiratory Distress Syndrome °Acute respiratory distress syndrome is a life-threatening condition in which fluid collects in the lungs. This condition can cause severe shortness of breath and low oxygen levels in the blood. It can also cause the lungs and other vital organs to fail. The condition usually develops within 24 to 48 hours of an infection, illness, surgery, or injury. °CAUSES °This condition may be caused by: °· An infection, such as sepsis or pneumonia. °· A serious injury to the head or chest. °· A major surgery. °· A drug overdose. °· Breathing in harmful chemicals or smoke. °· Blood transfusions. °· A blood clot in the lungs. °SYMPTOMS °Symptoms of this condition include: °· Fever. °· Difficulty breathing. °· Bluish skin (cyanosis). °· A fast or irregular heartbeat. °· Low blood pressure (hypotension). °· Agitation. °· Confusion. °· Lack of energy (lethargy). °· Sweating. °DIAGNOSIS °This condition may be diagnosed by using tests to rule out other diseases and conditions that cause similar symptoms. You may have: °· A chest X-ray or CT scan. °· Blood tests. °· A sputum culture. In this test, you will be asked to spit so that a sample of lung fluid can be taken for testing. °· Bronchoscopy. During this test a thin, flexible tool is passed into the mouth or nose, down the windpipe, and into the lungs. °TREATMENT °This condition may be treated with: °· Oxygen. A breathing machine (ventilator) is often used to provide oxygen and help with breathing. °· Medicine to help you relax (sedative). °· Fluids and nutrients given through an IV tube. °· Blood pressure medicine. °· Steroid medicine to help decrease inflammation in the lungs. °· Diuretic medicine to get rid of extra fluid in the body. °Additional treatment may be needed, depending on the cause of the condition. It can take up to 12 months to recover from this condition. Some people recover fully, but others may continue to  have: °· Weakness. °· Shortness of breath. °· Memory problems. °· Depression. °HOME CARE INSTRUCTIONS °Until you recover from this condition: °· Do not smoke. °· Limit alcohol intake to no more than 1 drink per day for nonpregnant women and 2 drinks per day for men. One drink equals 12 oz of beer, 5 oz of wine, or 1½ oz of hard liquor. °· Ask friends and family to help you if daily activities make you tired. °SEEK MEDICAL CARE IF: °· You become short of breath with activity or while resting. °· You develop a cough that does not go away. °· You have a fever. °SEEK IMMEDIATE MEDICAL CARE IF: °· You have sudden shortness of breath. °· You develop chest pain that does not go away. °· You develop swelling or pain in one of your legs. °· You cough up blood. °  °This information is not intended to replace advice given to you by your health care provider. Make sure you discuss any questions you have with your health care provider. °  °Document Released: 11/04/2005 Document Revised: 03/21/2015 Document Reviewed: 10/31/2014 °Elsevier Interactive Patient Education ©2016 [employer] ° °

## 2016-05-06 NOTE — Discharge Summary (Signed)
Physician Discharge Summary  Carmello Twing Bentivegna [id number] DOB: 1933/09/28 DOA: 05/01/2016  PCP: [name], MD  Admit date: 05/01/2016 Discharge date: 05/06/2016  Time spent: 45 minutes  Recommendations for Outpatient Follow-up:  Patient will be discharged to [facility].  Follow up with primary care provider as needed. Patient should continue medications as prescribed.  Patient should follow a heart healthy diet.   Discharge Diagnoses:  Acute on chronic respiratory failure with hypercapnia/hypoxia, COPD exacerbation Adenocarcinoma of the esophagus Coronary artery disease Chronic systolic CHF GERD  Discharge Condition: Stable  Diet recommendation: Heart healthy  Filed Weights   05/01/16 0747 05/01/16 1000  Weight: 67.132 kg (148 lb) 78.7 kg (173 lb 8 oz)    History of present illness:  On 05/01/2016 by Dr. Dana Allan 82 year old male under hospice care for adenocarcinoma of the esophagus. Patient also caries diagnosis of COPD on home oxygen, CAD, MI, Hyperlipidemia, Hypertension, afib, tobacco abuse, Left ventricular aneurysm with clot, CHF with documented EF of 35%. Patient is DNI and DNR. Patient is said to have become unresponsive earlier today likely secondary due to severely elevated CO2, and may have had a fall at home. Patient is also wheezing. On presentation to the ER, patient was unresponsive, and remains so, with PH of 7.13, PCO2 that was so high that it was not recordable. WBC is elevated at 15. Patient is not able to participate in any history. Patient is a widower, and has a son that is mentally challenged that is cared for by the patient. I also discussed with the Hospice Nurse.  Hospital Course:  Acute on chronic respiratory failure with hypercapnia (HCC)/COPD exacerbation  -Initially on admission the patient was unresponsive and showed significant improvement in mentation after being on BiPAP and antibiotics.  -Despite continues use of antibiotic as well as  steroids patient continues to show worsening mentation starting 05/04/2016. -Previous hospitalist discussed with POA, felt patient to be an appropriate candidate for inpatient hospice. -Palliative care consulted and appreciated -Patient to be discharged to [facility] today.  Adenocarcinoma of the esophagus -Palliative care was consulted on admission. -Continue when necessary morphine. -Long-term prognosis is limited and actual prognosis would be less than 4weeks.  Coronary artery disease/Chronic systolic CHF -Last EF [id number] -Appears euvolemic -Continuing home medications  GERD -Continuing PPI  Procedures: None  Consultations: Palliative care  Discharge Exam: Filed Vitals:   05/05/16 2024 05/06/16 0450  BP: 149/96 150/76  Pulse: 100 97  Temp: 98 F (36.7 C) 98 F (36.7 C)  Resp: 20 22     General: Well developed, well nourished, NAD  HEENT: NCAT, mucous membranes moist.  Cardiovascular: S1 S2 auscultated, no murmurs  Respiratory: Diminished breath sounds, scattered wheezing  Abdomen: Soft, nontender, nondistended, + bowel sounds  Extremities: warm dry without cyanosis clubbing or edema  Discharge Instructions     Medication List    STOP taking these medications        allopurinol 100 MG tablet  Commonly known as:  ZYLOPRIM     ALPRAZolam 0.25 MG tablet  Commonly known as:  XANAX     CALCIUM PO     clopidogrel 75 MG tablet  Commonly known as:  PLAVIX     [employer] D3 1000 units capsule  Generic drug:  Cholecalciferol     dexamethasone 4 MG tablet  Commonly known as:  DECADRON     FLUoxetine 10 MG capsule  Commonly known as:  PROZAC     guaiFENesin 600 MG 12 hr  tablet  Commonly known as:  MUCINEX     HYDROcodone-acetaminophen 5-325 MG tablet  Commonly known as:  NORCO     hydrocortisone 2.5 % cream     meclizine 25 MG tablet  Commonly known as:  ANTIVERT     mirtazapine 15 MG tablet  Commonly known as:  REMERON     pantoprazole 40 MG  tablet  Commonly known as:  PROTONIX     potassium chloride SA 20 MEQ tablet  Commonly known as:  KLOR-CON M20     pramipexole 0.125 MG tablet  Commonly known as:  MIRAPEX     PROAIR HFA 108 (90 Base) MCG/ACT inhaler  Generic drug:  albuterol     temazepam 30 MG capsule  Commonly known as:  RESTORIL      TAKE these medications        amoxicillin-clavulanate 500-125 MG tablet  Commonly known as:  AUGMENTIN  Take 1 tablet (500 mg total) by mouth 2 (two) times daily.     antiseptic oral rinse 0.05 % Liqd solution  Commonly known as:  CPC / CETYLPYRIDINIUM CHLORIDE 0.05%  7 mLs by Mouth Rinse route 2 times daily at 12 noon and 4 pm.     aspirin EC 81 MG tablet  Take 81 mg by mouth daily.     budesonide 0.25 MG/2ML nebulizer solution  Commonly known as:  PULMICORT  Take 2 mLs (0.25 mg total) by nebulization 2 (two) times daily.     feeding supplement (ENSURE ENLIVE) Liqd  Take 237 mLs by mouth 2 (two) times daily between meals.     furosemide 20 MG tablet  Commonly known as:  LASIX  Take 1 tablet (20 mg total) by mouth daily.     metoprolol tartrate 25 MG tablet  Commonly known as:  LOPRESSOR  Take 0.5 tablets (12.5 mg total) by mouth 2 (two) times daily.     NITROSTAT 0.4 MG SL tablet  Generic drug:  nitroGLYCERIN  PLACE 1 TABLET (0.4 MG TOTAL) UNDER THE TONGUE EVERY 5 (FIVE) MINUTES AS NEEDED.     ranitidine 150 MG capsule  Commonly known as:  ZANTAC  Take 150 mg by mouth at bedtime.     rosuvastatin 10 MG tablet  Commonly known as:  CRESTOR  Take 10 mg by mouth daily.       Allergies  Allergen Reactions  . Indomethacin Other (See Comments)    Unknown reaction  . Lipitor [Atorvastatin Calcium] Other (See Comments)    Leg pain  . Pravachol Other (See Comments)    Leg pain  . Procardia [Nifedipine] Other (See Comments)    Reaction unknown  . Zaroxolyn [Metolazone] Other (See Comments)    Reaction unknown   Follow-up Information    Schedule an  appointment as soon as possible for a visit with [name], MD.   Specialty:  Internal Medicine   Why:  As needed   Contact information:   [address] [place] [address] [phone]        The results of significant diagnostics from this hospitalization (including imaging, microbiology, ancillary and laboratory) are listed below for reference.    Significant Diagnostic Studies: Dg Chest Port 1 View  05/03/2016  CLINICAL DATA:  Shortness of breath. EXAM: PORTABLE CHEST 1 VIEW COMPARISON:  05/01/2016 and CT 12/27/2015 FINDINGS: Increased densities at the left lung base. Patient has had pleural fluid in this area but there may be increased consolidation. Increased densities along the medial right lung base.  Upper lungs are clear. No evidence for pulmonary edema. Heart size is grossly within normal limits and stable. The trachea is midline. IMPRESSION: Increased densities at both lung bases. Findings could represent atelectasis or consolidation. Cannot exclude increased left pleural fluid. This may be better characterized with a two view chest examination. Electronically Signed   By: Markus Daft M.D.   On: 05/03/2016 16:43   Dg Chest Port 1 View  05/01/2016  CLINICAL DATA:  Unwitnessed fall. Increased shortness of breath and wheezing. History of asthma. EXAM: PORTABLE CHEST 1 VIEW COMPARISON:  CT chest 12/27/2015. FINDINGS: Chronic lingular atelectasis/ opacity as demonstrated on prior CT, with pleural effusion, roughly stable to priors. No focal areas of consolidation. Crowded vessels at the RIGHT base reflect poor inspiratory effort. Normal heart size. No rib fracture. Thoracic spondylosis. No pneumothorax. IMPRESSION: Stable chest.  Chronic changes as described. Electronically Signed   By: Staci Righter M.D.   On: 05/01/2016 07:37    Microbiology: Recent Results (from the past 240 hour(s))  Blood Culture (routine x 2)     Status: None (Preliminary result)   Collection Time: 05/01/16   7:35 AM  Result Value Ref Range Status   Specimen Description BLOOD RIGHT ANTECUBITAL  Final   Special Requests BOTTLES DRAWN AEROBIC AND ANAEROBIC 5ML  Final   Culture   Final    NO GROWTH 4 DAYS Performed at [facility]    Report Status PENDING  Incomplete  Blood Culture (routine x 2)     Status: None (Preliminary result)   Collection Time: 05/01/16  7:35 AM  Result Value Ref Range Status   Specimen Description BLOOD LEFT FOREARM  Final   Special Requests BOTTLES DRAWN AEROBIC AND ANAEROBIC 5ML  Final   Culture   Final    NO GROWTH 4 DAYS Performed at [facility]    Report Status PENDING  Incomplete  MRSA PCR Screening     Status: None   Collection Time: 05/01/16 10:30 AM  Result Value Ref Range Status   MRSA by PCR NEGATIVE NEGATIVE Final    Comment:        The GeneXpert MRSA Assay (FDA approved for NASAL specimens only), is one component of a comprehensive MRSA colonization surveillance program. It is not intended to diagnose MRSA infection nor to guide or monitor treatment for MRSA infections.   Urine culture     Status: Abnormal   Collection Time: 05/01/16 12:59 PM  Result Value Ref Range Status   Specimen Description URINE, CATHETERIZED  Final   Special Requests NONE  Final   Culture 80,000 COLONIES/mL ENTEROCOCCUS SPECIES (A)  Final   Report Status 05/03/2016 FINAL  Final   Organism ID, Bacteria ENTEROCOCCUS SPECIES (A)  Final      Susceptibility   Enterococcus species - MIC*    AMPICILLIN <=2 SENSITIVE Sensitive     LEVOFLOXACIN >=8 RESISTANT Resistant     NITROFURANTOIN <=16 SENSITIVE Sensitive     VANCOMYCIN 1 SENSITIVE Sensitive     * 80,000 COLONIES/mL ENTEROCOCCUS SPECIES     Labs: Basic Metabolic Panel:  Recent Labs Lab 05/01/16 0735 05/01/16 1140 05/02/16 0250 05/04/16 0405  NA 140  --  137 140  K 4.8  --  4.2 3.9  CL 95*  --  94* 97*  CO2 42*  --  36* 35*  GLUCOSE 206*  --  273* 243*  BUN 30*  --  34* 36*  CREATININE  0.74  --  0.85 0.84  CALCIUM 8.8*  --  8.5* 8.8*  MG  --  2.5*  --  2.3  PHOS  --  5.0*  --   --    Liver Function Tests:  Recent Labs Lab 05/01/16 0735 05/04/16 0405  AST 30 32  ALT 63 55  ALKPHOS 116 89  BILITOT 0.6 0.7  PROT 6.7 6.0*  ALBUMIN 3.6 2.9*   No results for input(s): LIPASE, AMYLASE in the last 168 hours.  Recent Labs Lab 05/01/16 0735 05/01/16 1140  AMMONIA 19 73*   CBC:  Recent Labs Lab 05/01/16 0735 05/02/16 0250 05/04/16 0405  WBC 15.0* 14.2* 16.0*  NEUTROABS 12.7*  --  15.2*  HGB 13.1 11.1* 12.1*  HCT 41.6 34.3* 36.3*  MCV 108.6* 104.9* 102.0*  PLT 165 154 153   Cardiac Enzymes:  Recent Labs Lab 05/01/16 1140 05/01/16 1650 05/01/16 2248  TROPONINI 0.05* 0.05* 0.09*   BNP: BNP (last 3 results)  Recent Labs  09/21/15 1108 05/01/16 0735  BNP 114.0* 190.9*    ProBNP (last 3 results) No results for input(s): PROBNP in the last 8760 hours.  CBG: No results for input(s): GLUCAP in the last 168 hours.     SignedCristal Ford  [facility] 05/06/2016, 10:15 AM

## 2016-05-06 NOTE — Progress Notes (Signed)
Patient to d/c to [facility], report called to receiving nurse.  Copies pf all discharge medications and instructions sent to facility. Patient to be transported via ambulance.

## 2016-05-06 NOTE — Progress Notes (Signed)
Patient remains very confused, agitated and disoriented. Made successful attempts to get out of bed without assistan. Received a one time dose of Ativan 0.5 mg IV at 0115 with fair effect.

## 2016-05-06 NOTE — Progress Notes (Signed)
[facility] RN Liason Visit Received request from [place] of family interest in [facility] with request to transfer today.  Chart has been reviewed ; and there is bed availability today.   Met with patient's friend and POA, Mr. Janese Banks to confirm interest and explain services.  He is agreeable to transfer today.  CSW Claiborne Billings is aware.  Registration paperwork has been completed.  Dr. Orpah Melter to assume care per [facility] request.  Please fax discharge summary to [phone] . RN please call report to [phone].     Please arrange for transport as soon as possible.    Forms faxed to [facility] including contact information for primary caregiver.    Thank you!   Mickie Kay, [facility] Liason  [phone]

## 2016-05-18 DEATH — deceased

## 2016-05-27 NOTE — Progress Notes (Signed)
HPI: FU CAD; previously followed by Dr Mare Ferrari. He has a history of a remote apical myocardial infarction. He developed crescendo angina in March 2013. He underwent high risk drug-eluting stent placement to the circumflex and a drug-eluting stent to the left main with support of the impellla device by Dr. Burt Knack. He is now on long-term dual antiplatelet therapy with aspirin and Plavix. Carotid Dopplers September 2015 showed less than 50% stenosis bilaterally. Last echocardiogram November 2015 showed ejection fraction 40-45% with akinesis of the apical myocardium. Grade 1 diastolic dysfunction, mild left atrial enlargement and mild right ventricular enlargement. CT February 2017 showed abdominal aortic aneurysm measuring 3.1 x 2.3 cm. Patient has esophageal cancer and COPD on home oxygen. Recently discharged to [facility] : [facility]hospice. Since last seen   Current Outpatient Prescriptions  Medication Sig Dispense Refill  . amoxicillin-clavulanate (AUGMENTIN) 500-125 MG tablet Take 1 tablet (500 mg total) by mouth 2 (two) times daily. 10 tablet 0  . antiseptic oral rinse (CPC / CETYLPYRIDINIUM CHLORIDE 0.05%) 0.05 % LIQD solution 7 mLs by Mouth Rinse route 2 times daily at 12 noon and 4 pm.  0  . aspirin EC 81 MG tablet Take 81 mg by mouth daily.    . budesonide (PULMICORT) 0.25 MG/2ML nebulizer solution Take 2 mLs (0.25 mg total) by nebulization 2 (two) times daily. 60 mL 12  . feeding supplement, ENSURE ENLIVE, (ENSURE ENLIVE) LIQD Take 237 mLs by mouth 2 (two) times daily between meals. 237 mL 12  . furosemide (LASIX) 20 MG tablet Take 1 tablet (20 mg total) by mouth daily. 30 tablet 0  . metoprolol tartrate (LOPRESSOR) 25 MG tablet Take 0.5 tablets (12.5 mg total) by mouth 2 (two) times daily. 60 tablet 0  . NITROSTAT 0.4 MG SL tablet PLACE 1 TABLET (0.4 MG TOTAL) UNDER THE TONGUE EVERY 5 (FIVE) MINUTES AS NEEDED. 25 tablet 6  . ranitidine (ZANTAC) 150 MG capsule Take 150 mg by mouth at bedtime.   1  . rosuvastatin (CRESTOR) 10 MG tablet Take 10 mg by mouth daily.     No current facility-administered medications for this visit.     Past Medical History  Diagnosis Date  . 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 Has chronic angina; S/P DES to the LCX and DES to the L MAIN 12/2011. Now on Plavix and ASA.   Marland Kitchen Hyperlipidemia   . Hypertension   . Long-term (current) use of anticoagulants     For hx of LV apical aneurysm  . LVH (left ventricular hypertrophy)   . Atrial fibrillation ([place])     Unclear history  . Tobacco abuse   . Systolic heart failure     EF is 35% per cath 12/2011  . Left ventricular apical thrombus ([place])     hx/notes 10/06/2014  . On home oxygen therapy     "2.5L 24/7" (10/06/2014)  . COPD (chronic obstructive pulmonary disease) ([place])   . Pneumonia "several times"  . Arthritis     "shoulders; back" (10/06/2014)  . History of gout   . Chronic back pain     "anytime it's wet and cold" (10/06/2014)  . Nocturia   . Syncopal episodes   . Allergy   . CHF (congestive heart failure) ([place])   . Myocardial infarction [facility]) 1981    /notes 01/14/2012    Past Surgical History  Procedure  Laterality Date  . Cholecystectomy    . Hernia repair    . Coronary angioplasty with stent placement  12/2011    LCX and L main - drug eluting  . Cardiac catheterization  1981    [name] 01/14/2012  . Left heart catheterization with coronary angiogram N/A 01/16/2012    Procedure: LEFT HEART CATHETERIZATION WITH CORONARY ANGIOGRAM;  Surgeon: Sherren Mocha, MD;  Location: [facility] CATH LAB;  Service: Cardiovascular;  Laterality: N/A;  . Percutaneous coronary stent intervention (pci-s) N/A 01/20/2012    Procedure: PERCUTANEOUS CORONARY STENT INTERVENTION (PCI-S);  Surgeon: Sherren Mocha, MD;  Location: [facility] CATH LAB;  Service: Cardiovascular;  Laterality: N/A;  .  Esophagogastroduodenoscopy (egd) with propofol N/A 12/27/2015    Procedure: ESOPHAGOGASTRODUODENOSCOPY (EGD) WITH PROPOFOL;  Surgeon: Mauri Pole, MD;  Location: [place] ENDOSCOPY;  Service: Endoscopy;  Laterality: N/A;    Social History   Social History  . Marital Status: Widowed    Spouse Name: N/A  . Number of Children: N/A  . Years of Education: N/A   Occupational History  . Not on file.   Social History Main Topics  . Smoking status: Current Every Day Smoker -- 0.75 packs/day for 65 years    Types: Cigarettes  . Smokeless tobacco: Never Used  . Alcohol Use: Yes     Comment: "quit drinking in the 1990's"  . Drug Use: No  . Sexual Activity: Not Currently   Other Topics Concern  . Not on file   Social History Narrative    Family History  Problem Relation Age of Onset  . Heart disease Mother   . Cerebral palsy Daughter   . Hypertension Mother     ROS: no fevers or chills, productive cough, hemoptysis, dysphasia, odynophagia, melena, hematochezia, dysuria, hematuria, rash, seizure activity, orthopnea, PND, pedal edema, claudication. Remaining systems are negative.  Physical Exam: Well-developed well-nourished in no acute distress.  Skin is warm and dry.  HEENT is normal.  Neck is supple.  Chest is clear to auscultation with normal expansion.  Cardiovascular exam is regular rate and rhythm.  Abdominal exam nontender or distended. No masses palpated. Extremities show no edema. neuro grossly intact  ECG    This encounter was created in error - please disregard.

## 2016-05-29 ENCOUNTER — Encounter: Payer: Medicare Other | Admitting: Cardiology

## 2017-05-15 IMAGING — RF DG ESOPHAGUS
12 series · 12 of 12 positions shown · non-contrast
Comparison: None.

CLINICAL DATA: Dysphagia.  Nausea and vomiting.

EXAM:
ESOPHOGRAM/BARIUM SWALLOW
TECHNIQUE: Single contrast examination was performed using  thin barium.
FLUOROSCOPY TIME:  Radiation Exposure Index (as provided by the
fluoroscopic device):
If the device does not provide the exposure index:
Fluoroscopy Time:  1 minutes 54 seconds
Number of Acquired Images:

[Series 1: run · 1 of 1 slices shown (1 of 12)]
[im 1/1]
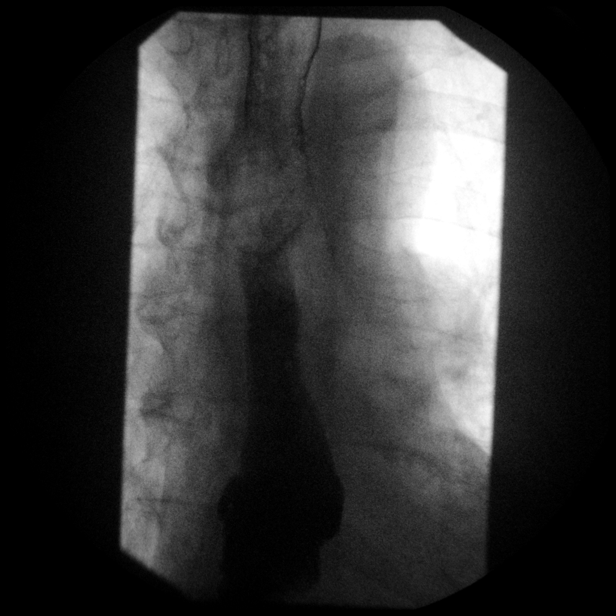

[Series 2: run · 1 of 1 slices shown (2 of 12)]
[im 1/1]
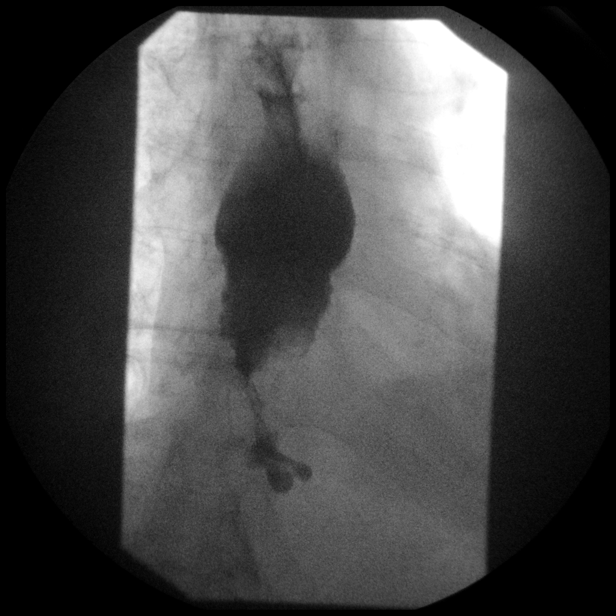

[Series 3: run · 1 of 1 slices shown (3 of 12)]
[im 1/1]
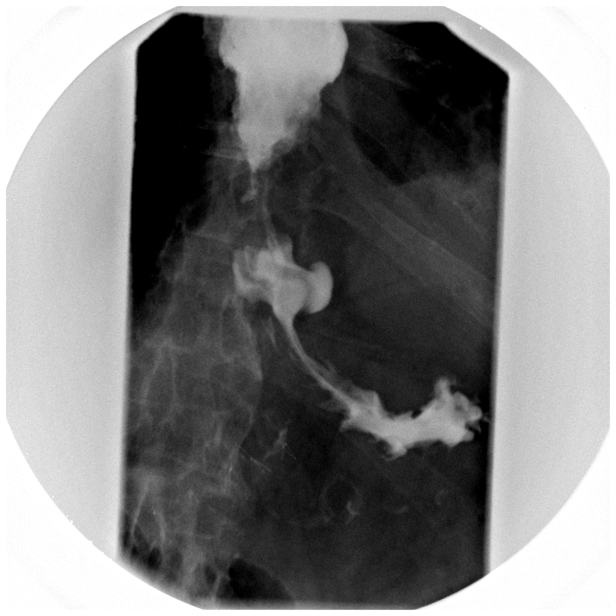

[Series 4: run · 1 of 1 slices shown (4 of 12)]
[im 1/1]
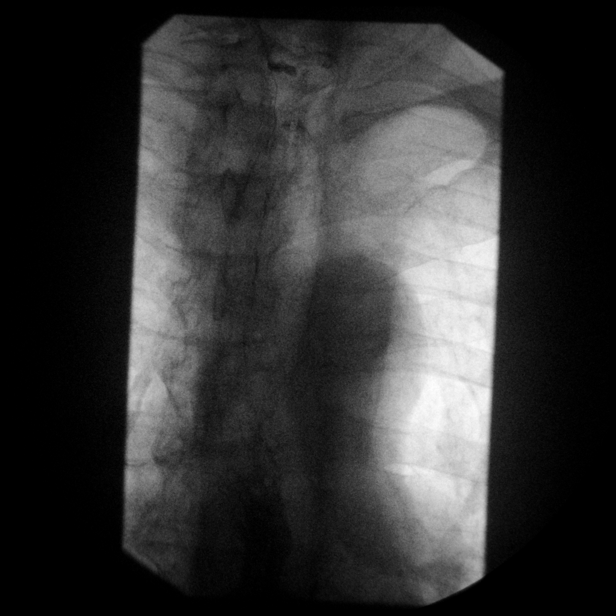

[Series 5: run · 1 of 1 slices shown (5 of 12)]
[im 1/1]
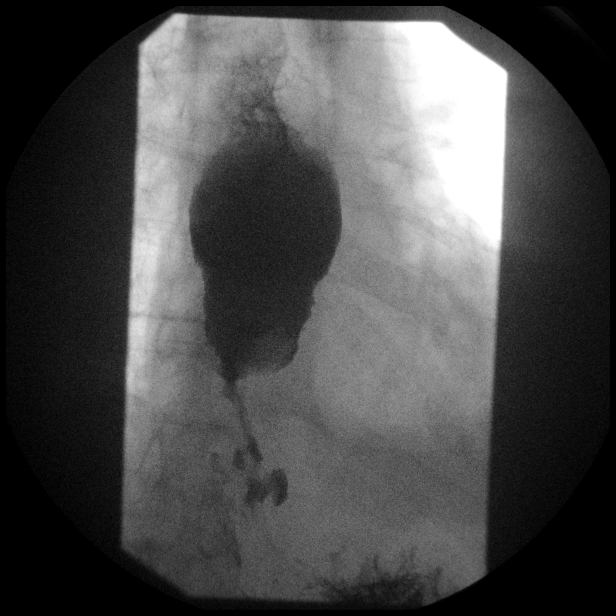

[Series 6: run · 1 of 1 slices shown (6 of 12)]
[im 1/1]
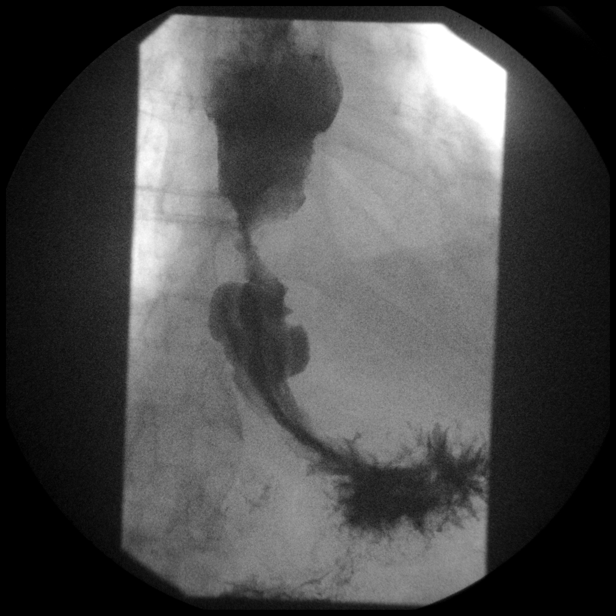

[Series 7: run · 1 of 1 slices shown (7 of 12)]
[im 1/1]
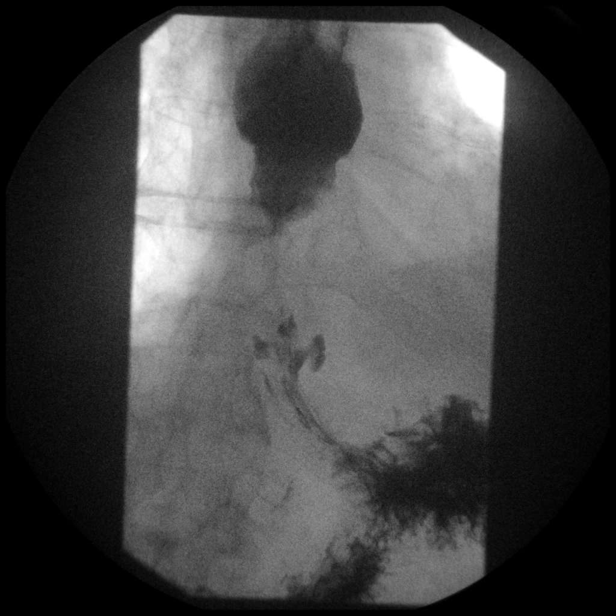

[Series 8: run · 1 of 1 slices shown (8 of 12)]
[im 1/1]
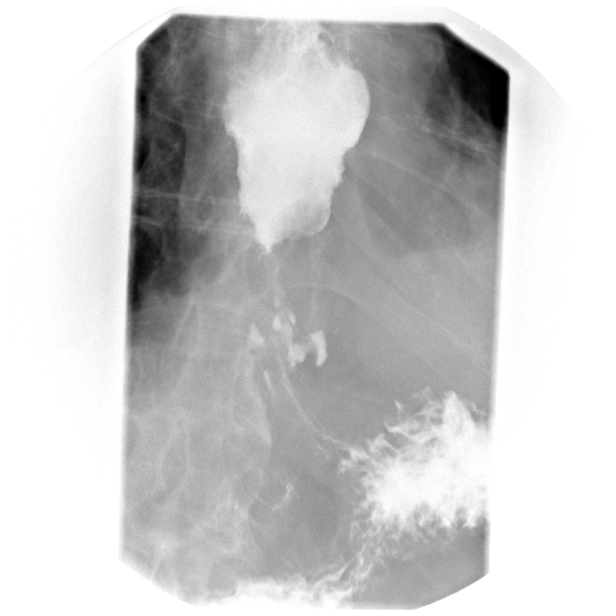

[Series 9: run · 1 of 1 slices shown (9 of 12)]
[im 1/1]
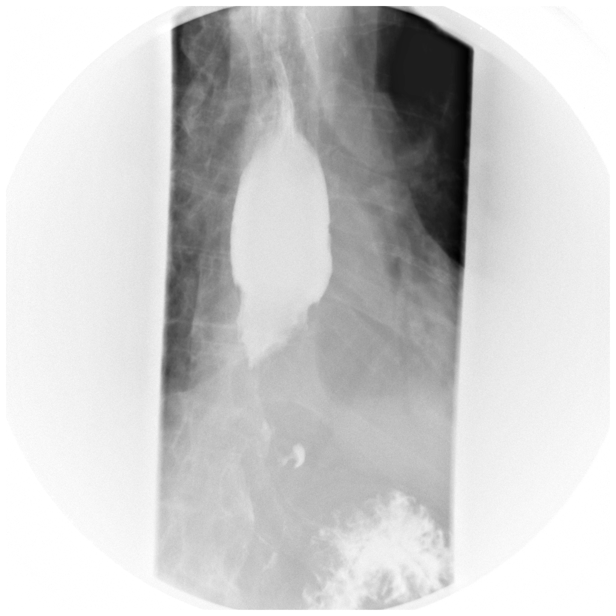

[Series 10: run · 1 of 1 slices shown (10 of 12)]
[im 1/1]
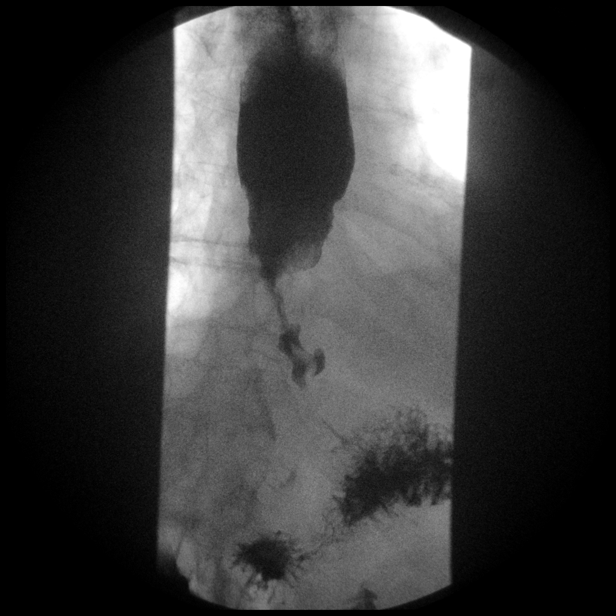

[Series 11: run · 1 of 1 slices shown (11 of 12)]
[im 1/1]
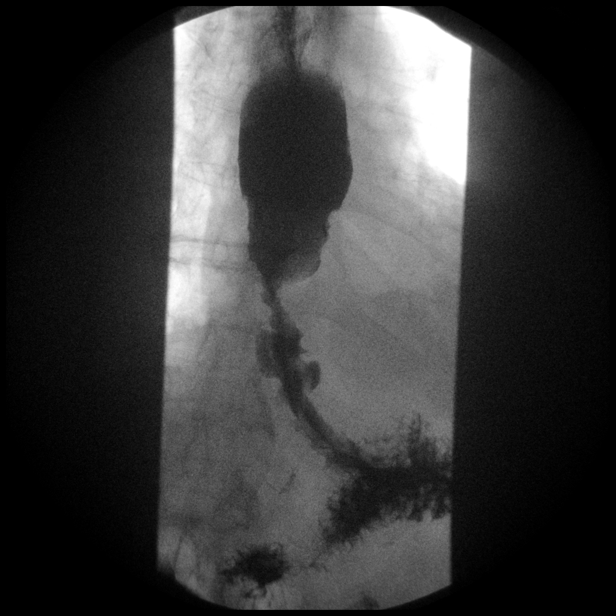

[Series 12: run · 1 of 1 slices shown (12 of 12)]
[im 1/1]
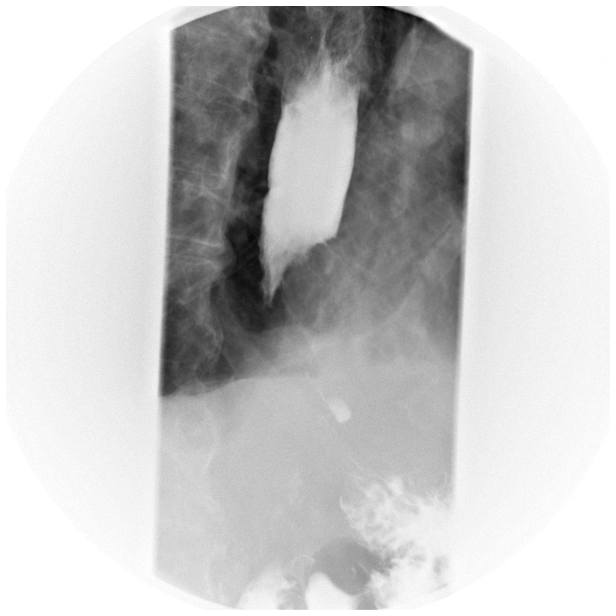

[12 of 12 positions shown; findings below may reference images not displayed]

FINDINGS: The patient was very short of breath and had difficulty swallowing
barium.

There is an irregular narrowing of the distal esophagus. The
esophagus is dilated above the narrowing. Barium did pass through
the area of narrowing which shows mucosal irregularity and is
worrisome for carcinoma of the distal esophagus. This appears be
partially obstructing. Limited evaluation of the remainder of the
esophagus. Endoscopy recommended.
IMPRESSION: Irregular mass distal esophagus consistent with carcinoma of the
esophagus. This is partially obstructing. Endoscopy and biopsy
recommended.

## 2017-05-16 IMAGING — CR DG CHEST 1V PORT
1 series · 1 of 1 positions shown · non-contrast
Comparison: Chest radiographs 09/21/2015.  Esophagram 12/25/2015.

CLINICAL DATA: 82-year-old male with shortness of Breath,
dysphagia, evidence of esophageal tumor on recent esophagram.
Initial encounter.

EXAM:
PORTABLE CHEST 1 VIEW

[AP]
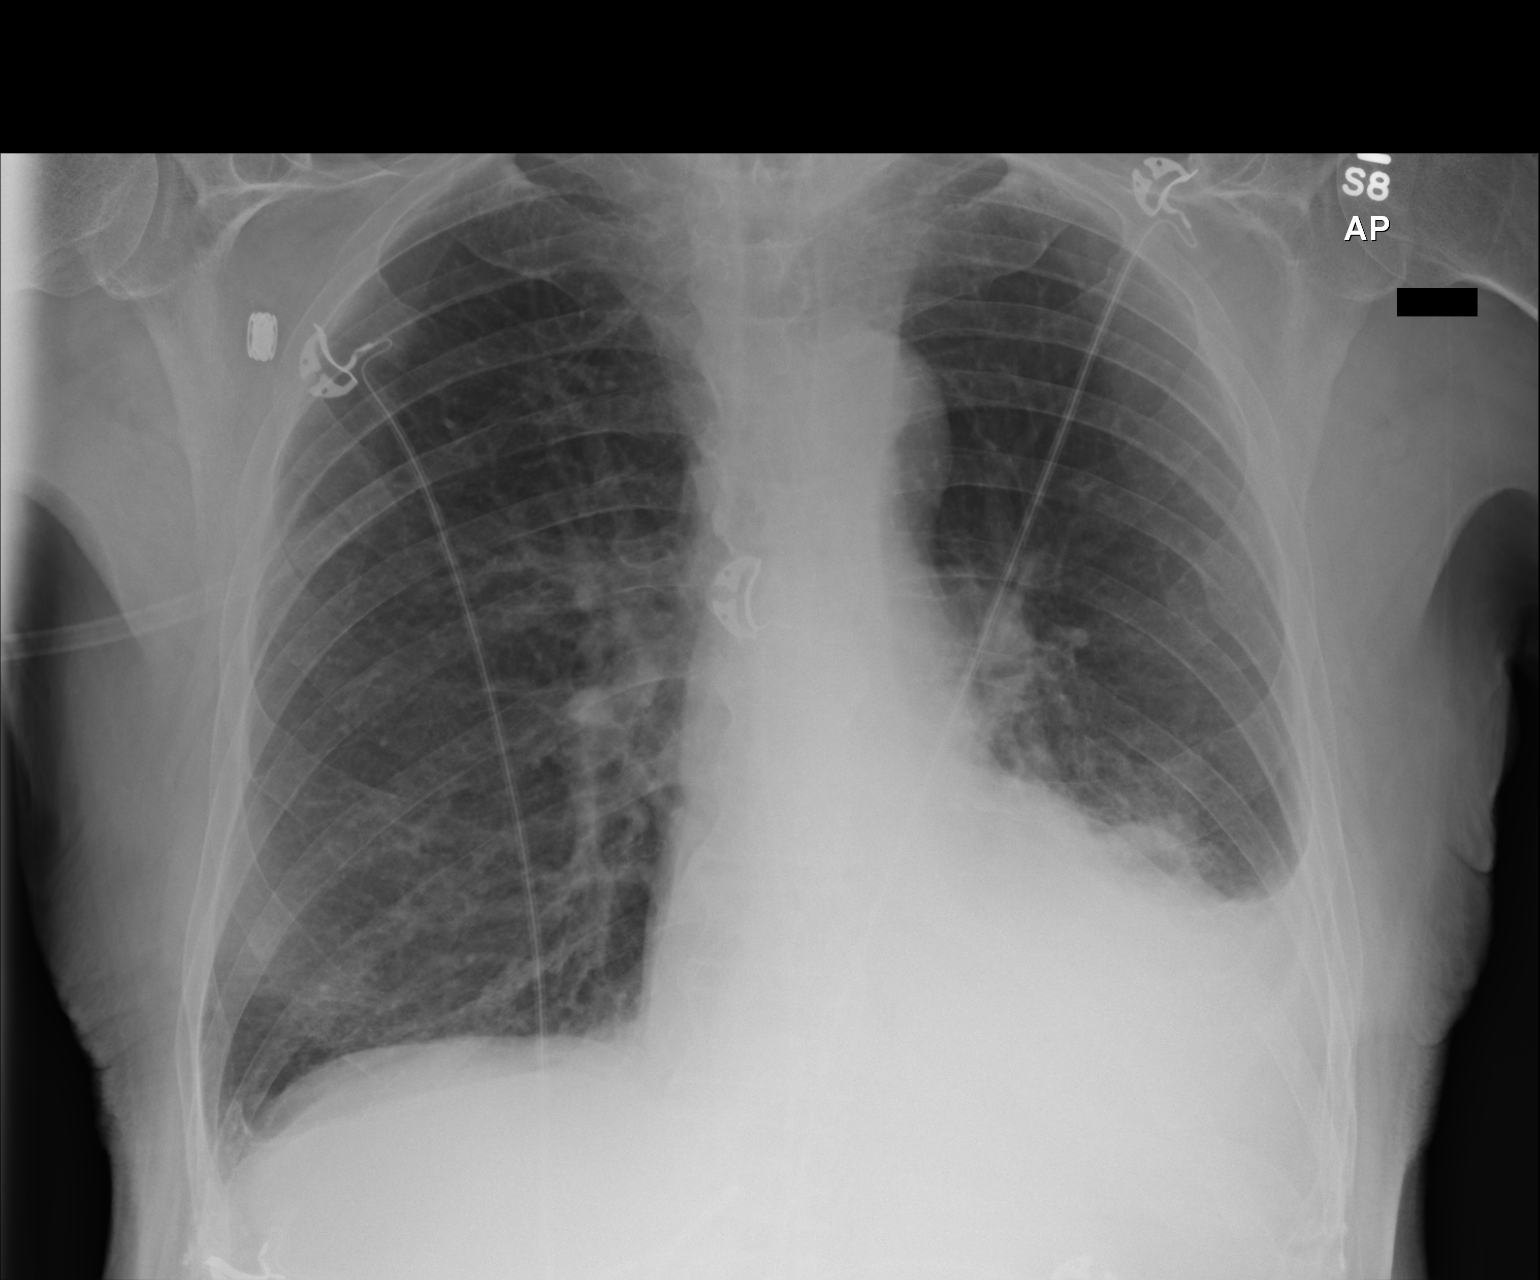

[1 of 1 positions shown; findings below may reference images not displayed]

FINDINGS: Portable AP semi upright view at 9304 hours. Small a moderate left
pleural effusion. Trace right pleural effusion. Mediastinal contours
are stable since [REDACTED]. Visualized tracheal air column is within
normal limits. No pneumothorax or pulmonary edema. Aside from the
dense opacification of the left lung base, no confluent pulmonary
opacity.
IMPRESSION: Small to moderate left pleural effusion with left lung base collapse
or consolidation.
